# Patient Record
Sex: Male | Born: 1940 | Race: White | Hispanic: No | Marital: Married | Smoking: Former smoker
Health system: Southern US, Community
[De-identification: ages and names within clinical notes are randomized; demographics above are authoritative.]

## PROBLEM LIST (undated history)

## (undated) DIAGNOSIS — E785 Hyperlipidemia, unspecified: Secondary | ICD-10-CM

## (undated) DIAGNOSIS — M1990 Unspecified osteoarthritis, unspecified site: Secondary | ICD-10-CM

## (undated) DIAGNOSIS — N529 Male erectile dysfunction, unspecified: Secondary | ICD-10-CM

## (undated) DIAGNOSIS — K219 Gastro-esophageal reflux disease without esophagitis: Secondary | ICD-10-CM

## (undated) DIAGNOSIS — K279 Peptic ulcer, site unspecified, unspecified as acute or chronic, without hemorrhage or perforation: Secondary | ICD-10-CM

## (undated) DIAGNOSIS — G43909 Migraine, unspecified, not intractable, without status migrainosus: Secondary | ICD-10-CM

## (undated) DIAGNOSIS — E079 Disorder of thyroid, unspecified: Secondary | ICD-10-CM

## (undated) DIAGNOSIS — C801 Malignant (primary) neoplasm, unspecified: Secondary | ICD-10-CM

## (undated) DIAGNOSIS — I10 Essential (primary) hypertension: Secondary | ICD-10-CM

## (undated) HISTORY — DX: Peptic ulcer, site unspecified, unspecified as acute or chronic, without hemorrhage or perforation: K27.9

## (undated) HISTORY — PX: SPINE SURGERY: SHX786

## (undated) HISTORY — DX: Male erectile dysfunction, unspecified: N52.9

## (undated) HISTORY — DX: Hyperlipidemia, unspecified: E78.5

## (undated) HISTORY — DX: Disorder of thyroid, unspecified: E07.9

## (undated) HISTORY — DX: Gastro-esophageal reflux disease without esophagitis: K21.9

## (undated) HISTORY — DX: Unspecified osteoarthritis, unspecified site: M19.90

## (undated) HISTORY — DX: Essential (primary) hypertension: I10

## (undated) HISTORY — PX: HERNIA REPAIR: SHX51

## (undated) HISTORY — PX: MENISCUS REPAIR: SHX5179

## (undated) HISTORY — DX: Migraine, unspecified, not intractable, without status migrainosus: G43.909

## (undated) HISTORY — DX: Malignant (primary) neoplasm, unspecified: C80.1

---
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
## 1998-09-25 ENCOUNTER — Emergency Department (HOSPITAL_COMMUNITY): Admission: EM | Admit: 1998-09-25 | Discharge: 1998-09-25 | Payer: Self-pay | Admitting: Emergency Medicine

## 2002-06-09 ENCOUNTER — Encounter: Payer: Self-pay | Admitting: Family Medicine

## 2002-06-09 ENCOUNTER — Encounter: Admission: RE | Admit: 2002-06-09 | Discharge: 2002-06-09 | Payer: Self-pay | Admitting: Family Medicine

## 2002-06-16 ENCOUNTER — Encounter: Payer: Self-pay | Admitting: Neurosurgery

## 2002-06-16 ENCOUNTER — Encounter: Admission: RE | Admit: 2002-06-16 | Discharge: 2002-06-16 | Payer: Self-pay | Admitting: Neurosurgery

## 2002-06-30 ENCOUNTER — Encounter: Payer: Self-pay | Admitting: Neurosurgery

## 2002-06-30 ENCOUNTER — Encounter: Admission: RE | Admit: 2002-06-30 | Discharge: 2002-06-30 | Payer: Self-pay | Admitting: Neurosurgery

## 2002-07-11 ENCOUNTER — Encounter: Payer: Self-pay | Admitting: Neurosurgery

## 2002-07-11 ENCOUNTER — Encounter: Admission: RE | Admit: 2002-07-11 | Discharge: 2002-07-11 | Payer: Self-pay | Admitting: Neurosurgery

## 2003-10-23 ENCOUNTER — Ambulatory Visit (HOSPITAL_COMMUNITY): Admission: RE | Admit: 2003-10-23 | Discharge: 2003-10-24 | Payer: Self-pay | Admitting: Neurosurgery

## 2005-07-16 ENCOUNTER — Ambulatory Visit: Payer: Self-pay | Admitting: Family Medicine

## 2005-07-21 ENCOUNTER — Ambulatory Visit: Payer: Self-pay

## 2005-07-31 ENCOUNTER — Encounter: Admission: RE | Admit: 2005-07-31 | Discharge: 2005-07-31 | Payer: Self-pay | Admitting: Neurosurgery

## 2005-08-28 ENCOUNTER — Ambulatory Visit: Payer: Self-pay | Admitting: Gastroenterology

## 2005-09-15 ENCOUNTER — Ambulatory Visit: Payer: Self-pay | Admitting: Gastroenterology

## 2005-09-15 ENCOUNTER — Encounter (INDEPENDENT_AMBULATORY_CARE_PROVIDER_SITE_OTHER): Payer: Self-pay | Admitting: *Deleted

## 2005-11-04 ENCOUNTER — Inpatient Hospital Stay (HOSPITAL_COMMUNITY): Admission: RE | Admit: 2005-11-04 | Discharge: 2005-11-10 | Payer: Self-pay | Admitting: Neurosurgery

## 2007-01-12 ENCOUNTER — Ambulatory Visit: Payer: Self-pay | Admitting: Family Medicine

## 2007-01-12 LAB — CONVERTED CEMR LAB
AST: 21 units/L (ref 0–37)
Basophils Relative: 0.6 % (ref 0.0–1.0)
CO2: 32 meq/L (ref 19–32)
Calcium: 9 mg/dL (ref 8.4–10.5)
Chloride: 101 meq/L (ref 96–112)
Eosinophils Relative: 2.8 % (ref 0.0–5.0)
GFR calc non Af Amer: 59 mL/min
Glucose, Bld: 112 mg/dL — ABNORMAL HIGH (ref 70–99)
HDL: 23.1 mg/dL — ABNORMAL LOW (ref >39.0)
PSA: 3.92 ng/mL (ref 0.10–4.00)
Platelets: 226 10*3/uL (ref 150–400)
RBC: 4.66 M/uL (ref 4.22–5.81)
RDW: 12.6 % (ref 11.5–14.6)
TSH: 3.37 microintl units/mL (ref 0.35–5.50)
Testosterone: 230.09 ng/dL — ABNORMAL LOW (ref 350.00–890)
Total CHOL/HDL Ratio: 8.2
Triglycerides: 460 mg/dL (ref 0–149)
VLDL: 92 mg/dL — ABNORMAL HIGH (ref 0–40)
WBC: 6.6 10*3/uL (ref 4.5–10.5)

## 2007-06-13 DIAGNOSIS — G43909 Migraine, unspecified, not intractable, without status migrainosus: Secondary | ICD-10-CM | POA: Insufficient documentation

## 2007-08-29 ENCOUNTER — Ambulatory Visit: Payer: Self-pay | Admitting: Family Medicine

## 2007-08-29 LAB — CONVERTED CEMR LAB
Glucose, Urine, Semiquant: NEGATIVE
Nitrite: NEGATIVE
Protein, U semiquant: NEGATIVE
WBC Urine, dipstick: NEGATIVE
pH: 5

## 2007-08-31 LAB — CONVERTED CEMR LAB
AST: 22 units/L (ref 0–37)
Basophils Absolute: 0.1 10*3/uL (ref 0.0–0.1)
Basophils Relative: 0.6 % (ref 0.0–1.0)
Bilirubin, Direct: 0.1 mg/dL (ref 0.0–0.3)
CO2: 29 meq/L (ref 19–32)
Chloride: 102 meq/L (ref 96–112)
Creatinine, Ser: 1.4 mg/dL (ref 0.4–1.5)
Direct LDL: 101.1 mg/dL
Eosinophils Relative: 2 % (ref 0.0–5.0)
Glucose, Bld: 100 mg/dL — ABNORMAL HIGH (ref 70–99)
HCT: 38.7 % — ABNORMAL LOW (ref 39.0–52.0)
Hemoglobin: 13.6 g/dL (ref 13.0–17.0)
MCHC: 35.2 g/dL (ref 30.0–36.0)
Monocytes Absolute: 0.7 10*3/uL (ref 0.2–0.7)
Neutrophils Relative %: 62.5 % (ref 43.0–77.0)
PSA: 5.33 ng/mL — ABNORMAL HIGH (ref 0.10–4.00)
Potassium: 4 meq/L (ref 3.5–5.1)
RDW: 13.2 % (ref 11.5–14.6)
Sodium: 140 meq/L (ref 135–145)
Total Bilirubin: 0.9 mg/dL (ref 0.3–1.2)
Total CHOL/HDL Ratio: 9.4
Total Protein: 6.5 g/dL (ref 6.0–8.3)
WBC: 9.7 10*3/uL (ref 4.5–10.5)

## 2007-11-23 ENCOUNTER — Ambulatory Visit: Payer: Self-pay | Admitting: Family Medicine

## 2007-11-23 DIAGNOSIS — R972 Elevated prostate specific antigen [PSA]: Secondary | ICD-10-CM | POA: Insufficient documentation

## 2007-11-23 DIAGNOSIS — F528 Other sexual dysfunction not due to a substance or known physiological condition: Secondary | ICD-10-CM | POA: Insufficient documentation

## 2007-11-23 DIAGNOSIS — I10 Essential (primary) hypertension: Secondary | ICD-10-CM | POA: Insufficient documentation

## 2007-12-01 HISTORY — PX: PROSTATE SURGERY: SHX751

## 2007-12-15 ENCOUNTER — Encounter: Payer: Self-pay | Admitting: Family Medicine

## 2007-12-16 ENCOUNTER — Ambulatory Visit: Payer: Self-pay | Admitting: Family Medicine

## 2008-01-03 ENCOUNTER — Encounter: Payer: Self-pay | Admitting: Family Medicine

## 2008-01-16 ENCOUNTER — Inpatient Hospital Stay (HOSPITAL_COMMUNITY): Admission: EM | Admit: 2008-01-16 | Discharge: 2008-01-18 | Payer: Self-pay | Admitting: Emergency Medicine

## 2008-01-16 ENCOUNTER — Ambulatory Visit: Payer: Self-pay | Admitting: Cardiovascular Disease

## 2008-01-16 ENCOUNTER — Ambulatory Visit: Payer: Self-pay | Admitting: Family Medicine

## 2008-01-17 ENCOUNTER — Encounter: Payer: Self-pay | Admitting: Internal Medicine

## 2008-01-17 DIAGNOSIS — R079 Chest pain, unspecified: Secondary | ICD-10-CM | POA: Insufficient documentation

## 2008-01-17 DIAGNOSIS — K270 Acute peptic ulcer, site unspecified, with hemorrhage: Secondary | ICD-10-CM | POA: Insufficient documentation

## 2008-01-17 DIAGNOSIS — K219 Gastro-esophageal reflux disease without esophagitis: Secondary | ICD-10-CM | POA: Insufficient documentation

## 2008-01-23 ENCOUNTER — Ambulatory Visit: Payer: Self-pay | Admitting: Internal Medicine

## 2008-02-01 ENCOUNTER — Encounter: Payer: Self-pay | Admitting: Family Medicine

## 2008-02-03 ENCOUNTER — Encounter: Payer: Self-pay | Admitting: Family Medicine

## 2008-02-06 ENCOUNTER — Encounter: Payer: Self-pay | Admitting: Family Medicine

## 2008-02-13 ENCOUNTER — Encounter: Payer: Self-pay | Admitting: Family Medicine

## 2008-02-13 ENCOUNTER — Ambulatory Visit (HOSPITAL_COMMUNITY): Admission: RE | Admit: 2008-02-13 | Discharge: 2008-02-13 | Payer: Self-pay | Admitting: Urology

## 2008-02-16 ENCOUNTER — Encounter: Payer: Self-pay | Admitting: Family Medicine

## 2008-02-22 ENCOUNTER — Ambulatory Visit: Payer: Self-pay | Admitting: Family Medicine

## 2008-02-23 LAB — CONVERTED CEMR LAB
Glucose, Bld: 113 mg/dL — ABNORMAL HIGH (ref 70–99)
Hgb A1c MFr Bld: 6.7 % — ABNORMAL HIGH (ref 4.6–6.0)

## 2008-02-29 ENCOUNTER — Encounter: Payer: Self-pay | Admitting: Family Medicine

## 2008-03-05 ENCOUNTER — Ambulatory Visit (HOSPITAL_COMMUNITY): Admission: RE | Admit: 2008-03-05 | Discharge: 2008-03-05 | Payer: Self-pay | Admitting: Urology

## 2008-03-19 ENCOUNTER — Encounter (INDEPENDENT_AMBULATORY_CARE_PROVIDER_SITE_OTHER): Payer: Self-pay | Admitting: Urology

## 2008-03-19 ENCOUNTER — Inpatient Hospital Stay (HOSPITAL_COMMUNITY): Admission: RE | Admit: 2008-03-19 | Discharge: 2008-03-20 | Payer: Self-pay | Admitting: Urology

## 2008-04-13 ENCOUNTER — Encounter: Payer: Self-pay | Admitting: Family Medicine

## 2008-05-25 ENCOUNTER — Encounter: Payer: Self-pay | Admitting: Family Medicine

## 2008-06-21 ENCOUNTER — Ambulatory Visit: Payer: Self-pay | Admitting: Family Medicine

## 2008-06-27 LAB — CONVERTED CEMR LAB
Creatinine,U: 48.4 mg/dL
Hgb A1c MFr Bld: 6.8 % — ABNORMAL HIGH (ref 4.6–6.0)
Microalb Creat Ratio: 4.1 mg/g (ref 0.0–30.0)

## 2008-08-24 ENCOUNTER — Encounter: Payer: Self-pay | Admitting: Family Medicine

## 2008-11-12 ENCOUNTER — Encounter: Payer: Self-pay | Admitting: Family Medicine

## 2008-12-11 ENCOUNTER — Encounter: Payer: Self-pay | Admitting: Family Medicine

## 2008-12-18 ENCOUNTER — Inpatient Hospital Stay (HOSPITAL_COMMUNITY): Admission: RE | Admit: 2008-12-18 | Discharge: 2008-12-19 | Payer: Self-pay | Admitting: General Surgery

## 2009-02-05 ENCOUNTER — Telehealth: Payer: Self-pay | Admitting: Family Medicine

## 2009-03-29 ENCOUNTER — Encounter: Payer: Self-pay | Admitting: Family Medicine

## 2009-04-09 ENCOUNTER — Ambulatory Visit: Payer: Self-pay | Admitting: Family Medicine

## 2009-04-10 LAB — CONVERTED CEMR LAB
ALT: 23 units/L (ref 0–53)
Alkaline Phosphatase: 91 units/L (ref 39–117)
Bilirubin, Direct: 0.1 mg/dL (ref 0.0–0.3)
Direct LDL: 74.1 mg/dL
HDL: 23.9 mg/dL — ABNORMAL LOW (ref >39.00)
Total Bilirubin: 0.8 mg/dL (ref 0.3–1.2)
Total Protein: 7.2 g/dL (ref 6.0–8.3)

## 2009-07-13 ENCOUNTER — Ambulatory Visit (HOSPITAL_COMMUNITY): Admission: RE | Admit: 2009-07-13 | Discharge: 2009-07-13 | Payer: Self-pay | Admitting: Sports Medicine

## 2009-10-22 ENCOUNTER — Ambulatory Visit: Payer: Self-pay | Admitting: Family Medicine

## 2009-10-22 LAB — CONVERTED CEMR LAB
BUN: 29 mg/dL — ABNORMAL HIGH (ref 6–23)
CO2: 29 meq/L (ref 19–32)
Calcium: 9 mg/dL (ref 8.4–10.5)
Chloride: 101 meq/L (ref 96–112)
Creatinine, Ser: 1.4 mg/dL (ref 0.4–1.5)
Eosinophils Relative: 0.8 % (ref 0.0–5.0)
Glucose, Bld: 111 mg/dL — ABNORMAL HIGH (ref 70–99)
HCT: 42.7 % (ref 39.0–52.0)
Hemoglobin: 14.4 g/dL (ref 13.0–17.0)
Lymphs Abs: 2.1 10*3/uL (ref 0.7–4.0)
MCV: 91.6 fL (ref 78.0–100.0)
Monocytes Absolute: 0.6 10*3/uL (ref 0.1–1.0)
Monocytes Relative: 6.2 % (ref 3.0–12.0)
Neutro Abs: 6.9 10*3/uL (ref 1.4–7.7)
Platelets: 224 10*3/uL (ref 150.0–400.0)
WBC: 9.7 10*3/uL (ref 4.5–10.5)

## 2009-10-23 LAB — CONVERTED CEMR LAB
LDL Cholesterol: 84 mg/dL (ref 0–99)
Total CHOL/HDL Ratio: 5
VLDL: 24.8 mg/dL (ref 0.0–40.0)

## 2009-11-19 ENCOUNTER — Ambulatory Visit: Payer: Self-pay | Admitting: Family Medicine

## 2009-12-04 ENCOUNTER — Encounter: Payer: Self-pay | Admitting: Family Medicine

## 2010-05-02 ENCOUNTER — Ambulatory Visit: Payer: Self-pay | Admitting: Family Medicine

## 2010-05-07 LAB — CONVERTED CEMR LAB
ALT: 21 units/L (ref 0–53)
AST: 19 units/L (ref 0–37)
Albumin: 3.8 g/dL (ref 3.5–5.2)
Cholesterol: 155 mg/dL (ref 0–200)
HDL: 24.9 mg/dL — ABNORMAL LOW (ref >39.00)
Hgb A1c MFr Bld: 7 % — ABNORMAL HIGH (ref 4.6–6.5)
Total Bilirubin: 0.7 mg/dL (ref 0.3–1.2)
Total Protein: 6.7 g/dL (ref 6.0–8.3)
Triglycerides: 395 mg/dL — ABNORMAL HIGH (ref 0.0–149.0)

## 2010-07-30 ENCOUNTER — Encounter (INDEPENDENT_AMBULATORY_CARE_PROVIDER_SITE_OTHER): Payer: Self-pay | Admitting: *Deleted

## 2010-09-08 ENCOUNTER — Telehealth (INDEPENDENT_AMBULATORY_CARE_PROVIDER_SITE_OTHER): Payer: Self-pay | Admitting: *Deleted

## 2010-10-01 ENCOUNTER — Encounter: Payer: Self-pay | Admitting: Family Medicine

## 2010-10-17 ENCOUNTER — Encounter (INDEPENDENT_AMBULATORY_CARE_PROVIDER_SITE_OTHER): Payer: Self-pay | Admitting: *Deleted

## 2010-10-28 ENCOUNTER — Encounter (INDEPENDENT_AMBULATORY_CARE_PROVIDER_SITE_OTHER): Payer: Self-pay | Admitting: *Deleted

## 2010-10-29 ENCOUNTER — Ambulatory Visit: Payer: Self-pay | Admitting: Gastroenterology

## 2010-11-05 ENCOUNTER — Telehealth (INDEPENDENT_AMBULATORY_CARE_PROVIDER_SITE_OTHER): Payer: Self-pay

## 2010-11-06 ENCOUNTER — Ambulatory Visit: Payer: Self-pay | Admitting: Gastroenterology

## 2010-11-13 ENCOUNTER — Ambulatory Visit: Payer: Self-pay | Admitting: Family Medicine

## 2010-11-13 DIAGNOSIS — Z8546 Personal history of malignant neoplasm of prostate: Secondary | ICD-10-CM | POA: Insufficient documentation

## 2010-11-13 DIAGNOSIS — Z8719 Personal history of other diseases of the digestive system: Secondary | ICD-10-CM | POA: Insufficient documentation

## 2010-11-14 LAB — CONVERTED CEMR LAB
ALT: 23 units/L (ref 0–53)
Albumin: 3.7 g/dL (ref 3.5–5.2)
Alkaline Phosphatase: 77 units/L (ref 39–117)
BUN: 37 mg/dL — ABNORMAL HIGH (ref 6–23)
Basophils Absolute: 0.1 10*3/uL (ref 0.0–0.1)
Bilirubin, Direct: 0.1 mg/dL (ref 0.0–0.3)
Cholesterol: 150 mg/dL (ref 0–200)
Creatinine, Ser: 1.6 mg/dL — ABNORMAL HIGH (ref 0.4–1.5)
Eosinophils Relative: 3.2 % (ref 0.0–5.0)
GFR calc non Af Amer: 45.89 mL/min — ABNORMAL LOW (ref >60.00)
Glucose, Bld: 106 mg/dL — ABNORMAL HIGH (ref 70–99)
HDL: 25 mg/dL — ABNORMAL LOW (ref >39.00)
Hgb A1c MFr Bld: 6.7 % — ABNORMAL HIGH (ref 4.6–6.5)
Monocytes Absolute: 0.6 10*3/uL (ref 0.1–1.0)
Monocytes Relative: 8.1 % (ref 3.0–12.0)
Neutrophils Relative %: 56 % (ref 43.0–77.0)
Platelets: 242 10*3/uL (ref 150.0–400.0)
RDW: 13.6 % (ref 11.5–14.6)
Total Protein: 6.9 g/dL (ref 6.0–8.3)
VLDL: 46.8 mg/dL — ABNORMAL HIGH (ref 0.0–40.0)
WBC: 7.8 10*3/uL (ref 4.5–10.5)

## 2010-11-19 ENCOUNTER — Telehealth (INDEPENDENT_AMBULATORY_CARE_PROVIDER_SITE_OTHER): Payer: Self-pay | Admitting: *Deleted

## 2010-11-21 ENCOUNTER — Ambulatory Visit: Payer: Self-pay | Admitting: Gastroenterology

## 2010-11-26 ENCOUNTER — Encounter: Payer: Self-pay | Admitting: Gastroenterology

## 2010-12-02 DIAGNOSIS — N19 Unspecified kidney failure: Secondary | ICD-10-CM | POA: Insufficient documentation

## 2010-12-03 ENCOUNTER — Ambulatory Visit: Admit: 2010-12-03 | Payer: Self-pay | Admitting: Family Medicine

## 2010-12-10 ENCOUNTER — Other Ambulatory Visit: Payer: Self-pay | Admitting: Family Medicine

## 2010-12-10 ENCOUNTER — Ambulatory Visit
Admission: RE | Admit: 2010-12-10 | Discharge: 2010-12-10 | Payer: Self-pay | Source: Home / Self Care | Attending: Family Medicine | Admitting: Family Medicine

## 2010-12-10 LAB — BASIC METABOLIC PANEL
BUN: 33 mg/dL — ABNORMAL HIGH (ref 6–23)
CO2: 26 mEq/L (ref 19–32)
Calcium: 9 mg/dL (ref 8.4–10.5)
Chloride: 100 mEq/L (ref 96–112)
Creatinine, Ser: 1.5 mg/dL (ref 0.4–1.5)
GFR: 50.6 mL/min — ABNORMAL LOW (ref >60.00)
Glucose, Bld: 99 mg/dL (ref 70–99)
Potassium: 4.4 mEq/L (ref 3.5–5.1)
Sodium: 136 mEq/L (ref 135–145)

## 2010-12-30 NOTE — Letter (Signed)
Summary: [facility] Instructions  [facility]  [address] [place], [place] [address]   Phone: [phone]  Fax: [phone]       Troy Howard    19-May-1941    MRN: [id number]        Procedure Day Dorna Bloom:  [name] 11/06/10     Arrival Time:  9:30AM     Procedure Time:  10:30AM     Location of Procedure:                    _X _  [facility] (4th Floor)                        PREPARATION FOR COLONOSCOPY WITH MOVIPREP   Starting 5 days prior to your procedure  11/01/10  do not eat nuts, seeds, popcorn, corn, beans, peas,  salads, or any raw vegetables.  Do not take any fiber supplements (e.g. Metamucil, Citrucel, and Benefiber).  THE DAY BEFORE YOUR PROCEDURE         DATE:  [facility]  11/05/10  1.  Drink clear liquids the entire day-NO SOLID FOOD  2.  Do not drink anything colored red or purple.  Avoid juices with pulp.  No orange juice.  3.  Drink at least 64 oz. (8 glasses) of fluid/clear liquids during the day to prevent dehydration and help the prep work efficiently.  CLEAR LIQUIDS INCLUDE: Water Jello Ice Popsicles Tea (sugar ok, no milk/cream) Powdered fruit flavored drinks Coffee (sugar ok, no milk/cream) Gatorade Juice: apple, white grape, white cranberry  Lemonade Clear bullion, consomm, broth Carbonated beverages (any kind) Strained chicken noodle soup Hard Candy                             4.  In the morning, mix first dose of MoviPrep solution:    Empty 1 Pouch A and 1 Pouch B into the disposable container    Add lukewarm drinking water to the top line of the container. Mix to dissolve    Refrigerate (mixed solution should be used within 24 hrs)  5.  Begin drinking the prep at 5:00 p.m. The MoviPrep container is divided by 4 marks.   Every 15 minutes drink the solution down to the next mark (approximately 8 oz) until the full liter is complete.   6.  Follow completed prep with 16 oz of clear liquid of your choice  (Nothing red or purple).  Continue to drink clear liquids until bedtime.  7.  Before going to bed, mix second dose of MoviPrep solution:    Empty 1 Pouch A and 1 Pouch B into the disposable container    Add lukewarm drinking water to the top line of the container. Mix to dissolve    Refrigerate  THE DAY OF YOUR PROCEDURE      DATE: 11/06/10   DAY:  THURSDAY  Beginning at  5:30AM (5 hours before procedure):         1. Every 15 minutes, drink the solution down to the next mark (approx 8 oz) until the full liter is complete.  2. Follow completed prep with 16 oz. of clear liquid of your choice.    3. You may drink clear liquids until 8:30AM  (2 HOURS BEFORE PROCEDURE).   MEDICATION INSTRUCTIONS  Unless otherwise instructed, you should take regular prescription medications with a small sip of water   as  early as possible the morning of your procedure.         OTHER INSTRUCTIONS  You will need a responsible adult at least 69 years of age to accompany you and drive you home.   This person must remain in the waiting room during your procedure.  Wear loose fitting clothing that is easily removed.  Leave jewelry and other valuables at home.  However, you may wish to bring a book to read or  an iPod/MP3 player to listen to music as you wait for your procedure to start.  Remove all body piercing jewelry and leave at home.  Total time from sign-in until discharge is approximately 2-3 hours.  You should go home directly after your procedure and rest.  You can resume normal activities the  day after your procedure.  The day of your procedure you should not:   Drive   Make legal decisions   Operate machinery   Drink alcohol   Return to work  You will receive specific instructions about eating, activities and medications before you leave.    The above instructions have been reviewed and explained to me by   Wyona Almas RN  October 29, 2010 4:35 PM     I fully  understand and can verbalize these instructions _____________________________ Date _________

## 2010-12-30 NOTE — Letter (Signed)
Summary: Diabetic Instructions  [facility]  [address] [place], [place] [address]   Phone: [phone]  Fax: [phone]    CYLE KENYON 19-Oct-1941 MRN: [id number]   _ x _   ORAL DIABETIC MEDICATION INSTRUCTIONS  The day before your procedure:   Take your diabetic pill as you do normally  The day of your procedure:   Do not take your diabetic pill    We will check your blood sugar levels during the admission process and again in Recovery before discharging you home  ________________________________________________________________________

## 2010-12-30 NOTE — Progress Notes (Signed)
Summary: Reschedule colon   Phone Note From Other Clinic   Caller: Dr Scotty Court Call For: Lavonna Rua Summary of Call: Patient is scheduled for colon tomorrow with Dr Russella Dar, but has admitted to a hospital in [facility] for enteritis.  Per Dr Russella Dar need to cancel colon for tomorrow and reschedule him in about a month.  I will contact the patient tomorrow.  Initial call taken by: Darcey Nora RN, CGRN,  November 05, 2010 1:53 PM  Follow-up for Phone Call        Left message for patient to call back Darcey Nora RN, [facility]  November 06, 2010 11:03 AM  Patient 's wife called and wants colon prior to the end of the year.  I have explained to the patient's wife that Dr Russella Dar indicated he wanted him to wait 1 month after admission.  Wife indicated he had severe dehydration and acute renal failure.  Wife is advised that Dr Russella Dar has no current openings prior to the end of the year.  Dr Russella Dar if an opening becomes available is it ok to schedule. Follow-up by: Darcey Nora RN, CGRN,  November 06, 2010 3:57 PM  Additional Follow-up for Phone Call Additional follow up Details #1::        Dr. Scotty Court sent me a flag and asked [place] to get the pt in before the end of the year for colonoscopy, for insurance reasons. 12/21 or 12/23 at 4pm are open.   Additional Follow-up by: Meryl Dare MD [facility],  November 06, 2010 5:01 PM    Additional Follow-up for Phone Call Additional follow up Details #2::    Patient is scheduled for a colon 11/21/10 4:00.  I have reviewed his change in colon instructions. Follow-up by: Darcey Nora RN, CGRN,  November 07, 2010 10:24 AM

## 2010-12-30 NOTE — Letter (Signed)
Summary: Pre Visit Letter Revised  [facility]  [address] [place], [place] [address]   Phone: [phone]  Fax: [phone]        10/17/2010 MRN: [id number] [facility] [address] [place], [place]  [address]             Procedure Date:  11/04/2010  Welcome to the Gastroenterology Division at [facility].    You are scheduled to see a nurse for your pre-procedure visit on 10/29/2010 at 4:00 PM on the 3rd floor at [facility], [address]  We ask that you try to arrive at our office 15 minutes prior to your appointment time to allow for check-in.  Please take a minute to review the attached form.  If you answer "Yes" to one or more of the questions on the first page, we ask that you call the person listed at your earliest opportunity.  If you answer "No" to all of the questions, please complete the rest of the form and bring it to your appointment.    Your nurse visit will consist of discussing your medical and surgical history, your immediate family medical history, and your medications.   If you are unable to list all of your medications on the form, please bring the medication bottles to your appointment and we will list them.  We will need to be aware of both prescribed and over the counter drugs.  We will need to know exact dosage information as well.    Please be prepared to read and sign documents such as consent forms, a financial agreement, and acknowledgement forms.  If necessary, and with your consent, a friend or relative is welcome to sit-in on the nurse visit with you.  Please bring your insurance card so that we may make a copy of it.  If your insurance requires a referral to see a specialist, please bring your referral form from your primary care physician.  No co-pay is required for this nurse visit.     If you cannot keep your appointment, please call [phone] to cancel or reschedule prior to your appointment date.   This allows [place] the opportunity to schedule an appointment for another patient in need of care.    Thank you for choosing [facility] for your medical needs.  We appreciate the opportunity to care for you.  Please visit [place] at our website  to learn more about our practice.  Sincerely, The Gastroenterology Division

## 2010-12-30 NOTE — Letter (Signed)
Summary: Colonoscopy Letter  [facility]  [address] [place], [place] [address]   Phone: [phone]  Fax: [phone]      July 30, 2010 MRN: [id number]   [facility] [address] [place], [place]  [address]   Dear Mr. Huizinga,   According to your medical record, it is time for you to schedule a Colonoscopy. The American Cancer Society recommends this procedure as a method to detect early colon cancer. Patients with a family history of colon cancer, or a personal history of colon polyps or inflammatory bowel disease are at increased risk.  This letter has beeen generated based on the recommendations made at the time of your procedure. If you feel that in your particular situation this may no longer apply, please contact our office.  Please call our office at [phone] to schedule this appointment or to update your records at your earliest convenience.  Thank you for cooperating with [place] to provide you with the very best care possible.   Sincerely,  Judie Petit T. Russella Dar, M.D.  [facility] [phone]

## 2010-12-30 NOTE — Progress Notes (Signed)
Summary: flu vaccination  Phone Note From Pharmacy        Immunization History:  Influenza Immunization History:    Influenza:  historical (09/05/2010)

## 2010-12-30 NOTE — Miscellaneous (Signed)
Summary: [facility] Previsit/prep  Clinical Lists Changes  Medications: Added new medication of MOVIPREP 100 GM  SOLR (PEG-KCL-NACL-NASULF-NA ASC-C) As per prep instructions. - Signed Rx of MOVIPREP 100 GM  SOLR (PEG-KCL-NACL-NASULF-NA ASC-C) As per prep instructions.;  #1 x 0;  Signed;  Entered by: Wyona Almas RN;  Authorized by: Meryl Dare MD [facility];  Method used: [id number] [address] [place], [place]  [address], Ph: [phone] or [phone], Fax: [phone] Observations: Added new observation of NKA: T (10/29/2010 15:37)    Prescriptions: MOVIPREP 100 GM  SOLR (PEG-KCL-NACL-NASULF-NA ASC-C) As per prep instructions.  #1 x 0   Entered by:   Wyona Almas RN   Authorized by:   Meryl Dare MD [facility]   Signed by:   Wyona Almas RN on 10/29/2010   Method used:   [facility]ronically to        [facility] [phone]* (retail)       [address]       [place], [place]  [address]       Ph: [phone] or [phone]       Fax: [phone]   RxID:   [id number]

## 2011-01-01 NOTE — Letter (Signed)
Summary: Patient Notice- Polyp Results   [facility]  [address] [place], [place] [address]   Phone: [phone]  Fax: [phone]        November 26, 2010 MRN: [id number]    [facility] [address] [place], [place]  [address]    Dear Mr. Ess,  I am pleased to inform you that the colon polyp(s) removed during your recent colonoscopy was (were) found to be benign (no cancer detected) upon pathologic examination.  I recommend you have a repeat colonoscopy examination in 5 years to look for recurrent polyps, as having colon polyps increases your risk for having recurrent polyps or even colon cancer in the future.  Should you develop new or worsening symptoms of abdominal pain, bowel habit changes or bleeding from the rectum or bowels, please schedule an evaluation with either your primary care physician or with me.  Continue treatment plan as outlined the day of your exam.  Please call us if you are having persistent problems or have questions about your condition that have not been fully answered at this time.  Sincerely,  Meryl Dare MD [facility]  This letter has been electronically signed by your physician.  Appended Document: Patient Notice- Polyp Results Letter mailed  Appended Document: Orders Update    Clinical Lists Changes  Problems: Added new problem of KIDNEY FAILURE (ICD-586) Orders: Added new Test order of TLB-BMP (Basic Metabolic Panel-BMET) (80048-METABOL) - Signed

## 2011-01-01 NOTE — Assessment & Plan Note (Signed)
Summary: Review Labs   Vital Signs:  Patient profile:   70 year old male Pulse rate:   78 / minute Pulse rhythm:   regular Resp:     16 per minute BP sitting:   122 /   (left arm) Cuff size:   large  History of Present Illness: This 70 year old white married male known diabetic, hypertension doing well until he had letter of diarrhea for approximately 5-6 days and was eventually admitted to [facility] and her weight area at that time he was found to have renal failure blood pressure were hypotensive he had an episode of near syncope was very dehydrated and admitted for coffee 5 date of discharge on 1210 11 Since then he's been doing her well and in today to repeat laboratory studies as well as examination renal studies and hemoglobin A1c with C. to be drawn Prior to this episode the patient in November her well his diabetes has been well controlled as well as his hypertension As of today he he has no complaints of his systems negative diarrhea was resolved has history of prostate carcinoma and doing well  Allergies: No Known Drug Allergies  Review of Systems      See HPI  Physical Exam  General:  Well-developed,well-nourished,in no acute distress; alert,appropriate and cooperative throughout examinationoverweight-appearing.   Head:  Normocephalic and atraumatic without obvious abnormalities. No apparent alopecia or balding. Eyes:  No corneal or conjunctival inflammation noted. EOMI. Perrla. Funduscopic exam benign, without hemorrhages, exudates or papilledema. Vision grossly normal. Ears:  External ear exam shows no significant lesions or deformities.  Otoscopic examination reveals clear canals, tympanic membranes are intact bilaterally without bulging, retraction, inflammation or discharge. Hearing is grossly normal bilaterally. Nose:  External nasal examination shows no deformity or inflammation. Nasal mucosa are pink and moist without lesions or exudates. Mouth:   Oral mucosa and oropharynx without lesions or exudates.  Teeth in good repair. Neck:  No deformities, masses, or tenderness noted. Chest Wall:  No deformities, masses, tenderness or gynecomastia noted. Lungs:  Normal respiratory effort, chest expands symmetrically. Lungs are clear to auscultation, no crackles or wheezes. Heart:  Normal rate and regular rhythm. S1 and S2 normal without gallop, murmur, click, rub or other extra sounds. Abdomen:  Bowel sounds positive,abdomen soft and non-tender without masses, organomegaly or hernias noted. Msk:  No deformity or scoliosis noted of thoracic or lumbar spine.   Pulses:  R and L carotid,radial,femoral,dorsalis pedis and posterior tibial pulses are full and equal bilaterally Extremities:  No clubbing, cyanosis, edema, or deformity noted with normal full range of motion of all joints.   Neurologic:  No cranial nerve deficits noted. Station and gait are normal. Plantar reflexes are down-going bilaterally. DTRs are symmetrical throughout. Sensory, motor and coordinative functions appear intact. Skin:  Intact without suspicious lesions or rashes Cervical Nodes:  No lymphadenopathy noted Axillary Nodes:  No palpable lymphadenopathy Inguinal Nodes:  No significant adenopathy Psych:  Cognition and judgment appear intact. Alert and cooperative with normal attention span and concentration. No apparent delusions, illusions, hallucinations   Impression & Recommendations:  Problem # 1:  GERD (ICD-530.81) Assessment Improved  His updated medication list for this problem includes:    Omeprazole 40 Mg Cpdr (Omeprazole) .Marland Kitchen... 1 by mouth once daily  Problem # 2:  KIDNEY FAILURE (ICD-586) Assessment: Deteriorated 2 repeat BMP  Problem # 3:  HYPERTENSION, BENIGN (ICD-401.1) Assessment: Improved  His updated medication list for this problem includes:    Benicar Hct  40-25 Mg Tabs (Olmesartan medoxomil-hctz) .Marland Kitchen... 1 tab once daily  Problem # 4:  PROSTATE  CANCER, HX OF (ICD-V10.46) Assessment: Improved  Complete Medication List: 1)  Benicar Hct 40-25 Mg Tabs (Olmesartan medoxomil-hctz) .Marland Kitchen.. 1 tab once daily 2)  Fioricet 50-325-40 Mg Tabs (Butalbital-apap-caffeine) .... 2 tabs every 4 hrs as needed for headache 3)  Adult Aspirin Ec Low Strength 81 Mg Tbec (Aspirin) .Marland Kitchen.. 1 once daily 4)  Omeprazole 40 Mg Cpdr (Omeprazole) .Marland Kitchen.. 1 by mouth once daily 5)  Metformin Hcl 500 Mg Tabs (Metformin hcl) .Marland Kitchen.. 1 am and pm for diabetes  Patient Instructions: 1)  we'll call results of lab studies as well examination reveals your doing well 2)  I feel he reinflated will correct with time   Orders Added: 1)  Est. Patient Level IV [[address]]

## 2011-01-01 NOTE — Progress Notes (Signed)
Summary: Colon this fri 12-23  Phone Note Call from Patient Call back at [phone] OR [phone]   Call For: DR [facility] Summary of Call: Colon was changed and is not sure when to start drinking his Moviprep Initial call taken by: Leanor Kail [facility],  November 19, 2010 2:45 PM  Follow-up for Phone Call        Reviewed changes in prep instructions with patient. Follow-up by: Ezra Sites RN,  November 19, 2010 3:03 PM     Appended Document: Colon this fri 12-23 reviewed

## 2011-01-01 NOTE — Procedures (Signed)
Summary: Colonoscopy  Patient: Troy Howard Note: All result statuses are Final unless otherwise noted.  Tests: (1) Colonoscopy (COL)   COL Colonoscopy           DONE     [facility]     [address]     [place], [place]  [address]           COLONOSCOPY PROCEDURE REPORT     PATIENT:  Troy, Howard  MR#:  [id number]     BIRTHDATE:  1941/03/29, 69 yrs. old  GENDER:  male     ENDOSCOPIST:  Judie Petit T. Russella Dar, MD, [facility]           PROCEDURE DATE:  11/21/2010     PROCEDURE:  Colonoscopy with biopsy and snare polypectomy     ASA CLASS:  Class II     INDICATIONS:  1) Elevated Risk Screening  2) family history of     colon cancer: father in his 31s.     MEDICATIONS:   Fentanyl 62.5 mcg IV, Versed 7.5 mg IV     DESCRIPTION OF PROCEDURE:   After the risks benefits and     alternatives of the procedure were thoroughly explained, informed     consent was obtained.  Digital rectal exam was performed and     revealed no abnormalities.   The LB [id number] endoscope     was introduced through the anus and advanced to the cecum, which     was identified by both the appendix and ileocecal valve, without     limitations.  The quality of the prep was good, using MoviPrep.     The instrument was then slowly withdrawn as the colon was fully     examined.     <<PROCEDUREIMAGES>>     FINDINGS:  A sessile polyp was found in the cecum. It was 3 mm in     size. The polyp was removed using cold biopsy forceps.  A sessile     polyp was found in the mid transverse colon. It was 5 mm in size.     Polyp was snared without cautery. Retrieval was successful.     Moderate diverticulosis was found in the sigmoid colon.  This was     otherwise a normal examination of the colon. Retroflexed views in     the rectum revealed no abnormalities. The time to cecum =  2     minutes. The scope was then withdrawn (time =  9.25  min) from the     patient and the procedure completed.        COMPLICATIONS:  None           ENDOSCOPIC IMPRESSION:     1) 3 mm sessile polyp in the cecum     2) 5 mm sessile polyp in the mid transverse colon     3) Moderate diverticulosis in the sigmoid colon           RECOMMENDATIONS:     1) Await pathology results     2) High fiber diet with liberal fluid intake.     3) Repeat Colonoscopy in 5 years pending pathology review.           Venita Lick. Russella Dar, MD, [facility]           CC: Dianna Limbo, MD           n.     Rosalie DoctorVenita Lick. Stark at 11/21/2010 02:17 PM  Abbott, Jasinski, [id number]  Note: An exclamation mark (!) indicates a result that was not dispersed into the flowsheet. Document Creation Date: 11/21/2010 2:18 PM _______________________________________________________________________  (1) Order result status: Final Collection or observation date-time: 11/21/2010 14:13 Requested date-time:  Receipt date-time:  Reported date-time:  Referring Physician:   Ordering Physician: Claudette Head [id number]) Specimen Source:  Source: Launa Grill Order Number: [id number] Lab site:   Appended Document: Colonoscopy     Procedures Next Due Date:    Colonoscopy: 10/2015  Appended Document: Colonoscopy reviewed

## 2011-01-02 NOTE — Letter (Signed)
Summary: [facility]   Imported By: Sherian Rein 10/14/2010 11:19:59  _____________________________________________________________________  External Attachment:    Type:   Image     Comment:   External Document

## 2011-01-02 NOTE — Letter (Signed)
Summary: [facility]  [facility]   Imported By: Maryln Gottron 01/08/2010 13:00:19  _____________________________________________________________________  External Attachment:    Type:   Image     Comment:   External Document

## 2011-02-09 LAB — GLUCOSE, CAPILLARY
Glucose-Capillary: 108 mg/dL — ABNORMAL HIGH (ref 70–99)
Glucose-Capillary: 152 mg/dL — ABNORMAL HIGH (ref 70–99)
Glucose-Capillary: 71 mg/dL (ref 70–99)
Glucose-Capillary: 83 mg/dL (ref 70–99)

## 2011-02-13 ENCOUNTER — Other Ambulatory Visit: Payer: Self-pay | Admitting: Family Medicine

## 2011-02-23 ENCOUNTER — Other Ambulatory Visit: Payer: Self-pay | Admitting: Family Medicine

## 2011-03-16 LAB — DIFFERENTIAL
Eosinophils Absolute: 0.1 10*3/uL (ref 0.0–0.7)
Lymphocytes Relative: 23 % (ref 12–46)
Lymphs Abs: 2.2 10*3/uL (ref 0.7–4.0)
Monocytes Relative: 6 % (ref 3–12)
Neutrophils Relative %: 70 % (ref 43–77)

## 2011-03-16 LAB — COMPREHENSIVE METABOLIC PANEL
ALT: 33 U/L (ref 0–53)
AST: 23 U/L (ref 0–37)
Calcium: 8.6 mg/dL (ref 8.4–10.5)
Creatinine, Ser: 1.24 mg/dL (ref 0.4–1.5)
GFR calc Af Amer: >60 mL/min (ref >60)
Sodium: 135 mEq/L (ref 135–145)
Total Protein: 7 g/dL (ref 6.0–8.3)

## 2011-03-16 LAB — CBC
MCHC: 34.5 g/dL (ref 30.0–36.0)
MCV: 87.7 fL (ref 78.0–100.0)
RDW: 13.9 % (ref 11.5–15.5)

## 2011-04-14 NOTE — H&P (Signed)
Troy Howard, Troy Howard NO.:  [id number]   MEDICAL RECORD NO.:  [id number]          PATIENT TYPE:  EMS   LOCATION:  MAJO                         FACILITY:  [facility]   PHYSICIAN:  Celene Kras, MD       DATE OF BIRTH:  [birth year]   DATE OF ADMISSION:  01/16/2008  DATE OF DISCHARGE:                              HISTORY & PHYSICAL   PRIMARY CARE Gerado Nabers:  Tawny Asal, M.D.   PRIMARY CARDIOLOGIST:  [facility], being seen by Dr. Tonny Bollman.   PATIENT PROFILE:  A 70 year old married Caucasian male without prior  history of CAD who presents with greater than 18-hour history of chest  pain with shortness of breath.   PROBLEMS:  1. Unstable angina.      a.     July 21, 2005 exercise Myoview, exercise time 8 minutes 30       seconds.  Heart rate 150 beats per minute.  METS 10.1.  No acute       ST/T changes.  EF 53%.  Probable diaphragmatic attenuation.  No       ischemia.  2. Hypertension.  3. Borderline diabetes mellitus.  4. History of migraine headaches.  5. History of herniated L4-L5 disk status post L4-L5 laminectomy and      diskectomy November 2004.  6. History of lumbar spondylosis, status post L4-L5, L5-S1      laminectomy, diskectomy and fusion November 05, 2007.  7. BPH, pending biopsy January 18, 2008.   HISTORY OF PRESENT ILLNESS:  A 70 year old married Caucasian male  without prior history of CAD.  He was in his usual state of health until  last night at approximately 9:00 p.m., when he developed 5 to 6 out of  10 substernal chest pain, burning and indigestion associated with  shortness breath, diaphoresis, nausea, frequent belching, and a small  amount of regurgitation.  He took 2 Tums without relief.  He had  continued symptoms and restlessness throughout the night and this  morning noted intermittent left wrist, forearm and biceps pain and  shooting discomfort in addition to his chest pain.  He saw his primary  care Jcion Buddenhagen  this morning, ECG showing subtle ST depression in III, V3  through V5, and he was sent to the ED.  He remains fairly uncomfortable,  complaining of chills and has a low-grade fever.  Lab work is currently  pending.   ALLERGIES:  NO KNOWN DRUG ALLERGIES.   HOME MEDICATIONS:  1. Benicar 40 mg daily.  2. Aspirin 81 mg daily (he has been off pending prostate biopsy).   FAMILY HISTORY:  Mother died at age 1 with a history of hypertension  and Alzheimer's.  Father died of colon CA at 11.  He has a brother and  sister, both of whom have hypertension and diabetes.   SOCIAL HISTORY:  He lives in [place] with his wife.  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  He occasionally will have a beer.  He  denies  any drug use.  He does not routinely exercising but does not  generally experience limitation in activities.   REVIEW OF SYSTEMS:  Positive for chest pain, shortness of breath,  dyspnea on exertion, headache, chills, diaphoresis and nausea with  frequent belching.  History of borderline diabetes.  Otherwise all  systems reviewed and negative.   PHYSICAL EXAMINATION:  VITAL SIGNS:  Temperature 97.9, heart rate 99,  respirations 20, blood pressure 126/61, pulse ox 96% on 2 liters.  GENERAL:  A pleasant white male in no acute distress.  Awake, alert and  oriented x3.  HEENT:  Normal.  NEUROLOGIC:  Grossly intact, nonfocal.  SKIN:  Warm and dry without lesion or masses.  LUNGS:  Respirations are regular and unlabored.  Clear to auscultation.  CARDIAC:  Regular distant S1, S2.  No S3, S4 or murmurs.  ABDOMEN:  Round, soft with mild epigastric tenderness.  Bowel sounds  present x4.  EXTREMITIES:  Warm, dry, pink.  No clubbing, cyanosis or edema.  Dorsalis pedis, posterior tibial pulses 2+ and equal bilaterally.   Chest x-ray is pending.  EKG shows sinus rhythm at a rate of 99 beats  per minute, normal axis, less  than 1 mm ST depression in leads III, V3,  V4 and V5.  Lab work is pending.   ASSESSMENT/PLAN:  1. Unstable angina.  Labs are pending.  The patient is fairly      uncomfortable.  Check cardiac markers and plan cardiac      catheterization this afternoon.  Add heparin, nitrate, aspirin,      statin and beta blocker.  The patient also has chills and mild      abdominal discomfort with low-grade fever.  Also concerned about      possible abdominal pathology such as acute pancreatitis.  We will      check LFTs, amylase and lipase.  Check D-dimer.  Rule out PE.  2. Hypertension, stable.  He uses ARB at home and we will continue      this.  3. Lipids.  We will check lipids and LFTs and add statin, especially      in the setting of borderline diabetes.  4. Borderline diabetes mellitus.  Check CBGs, add sliding-scale      insulin.      Nicolasa Ducking, ANP    ______________________________  Celene Kras, MD    CB/MEDQ  D:  01/16/2008  T:  01/17/2008  Job:  [id number]

## 2011-04-14 NOTE — Op Note (Signed)
Troy Howard, Troy Howard            ACCOUNT NO.:  [id number]   MEDICAL RECORD NO.:  [id number]          PATIENT TYPE:  INP   LOCATION:  1526                         FACILITY:  [facility]   PHYSICIAN:  Heloise Purpura, MD      DATE OF BIRTH:  12-28-1940   DATE OF PROCEDURE:  03/19/2008  DATE OF DISCHARGE:                               OPERATIVE REPORT   PREOPERATIVE DIAGNOSES:  1. 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  2. Umbilical hernia.   POSTOPERATIVE DIAGNOSES:  1. 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  2. Umbilical hernia.   PROCEDURE:  1. Robotic-assisted laparoscopic radical prostatectomy (right nerve      sparing).  2. Bilateral laparoscopic pelvic lymphadenectomy.  3. Umbilical hernia repair   SURGEON:  Dr. Heloise Purpura.   ASSISTANT:  Dr. Delman Kitten.   ANESTHESIA:  General.   COMPLICATIONS:  None.   ESTIMATED BLOOD LOSS:  150 mL.   SPECIMENS:  1. Prostate and seminal vesicles.  2. Right pelvic lymph nodes.  3. Left pelvic lymph nodes.   DRAINS:  1. A 20-French straight catheter.  2. A #19 Blake pelvic drain.   DISPOSITION OF SPECIMENS:  To pathology.   INDICATION:  Troy Howard is a 70 year old gentleman with clinically  localized adenocarcinoma of the prostate.  He did undergo a bone scan  which demonstrated some questionable uptake along his spine.  He  therefore underwent plain films which actually demonstrated the area of  concern to represent the left eleventh rib.  While metastatic disease  could not be excluded, it was felt that this was potentially related to  trauma in the past.  These findings were discussed with the patient, and  it was felt that therapy of curative intent should not be denied to him  based on these findings.  We also discussed the incidental finding of an  he umbilical hernia.  After discussing all of these findings, he did  elect to  proceed with surgical therapy for his prostate cancer and the  above procedure as well as repair of umbilical hernia.  Potential  risks/complications, alternative options, etc.,  were discussed with the  patient in detail, and informed consent was obtained.   DESCRIPTION OF PROCEDURE:  The patient was taken to the operating room,  and a general anesthetic was administered.  He was given preoperative  antibiotics, placed in the dorsal lithotomy position, and prepped and  draped in the usual sterile fashion.  Next, a preoperative time-out was  performed.  A Foley catheter was then inserted into the bladder.  A site  was selected just superior to the umbilicus in the midline for placement  of the camera port.  This was placed using a standard open Hassan  technique which allowed entry into the peritoneal cavity under direct  vision without difficulty.  A 12-mm port was then placed, and a  pneumoperitoneum was established.  A 0 degrees lens was used to inspect  the abdomen, and there was no evidence of  any intra-abdominal injuries  or other abnormalities.  The patient's umbilical hernia defect could be  palpated digitally upon entering the abdomen.  The remaining ports were  then placed.  Bilateral 8-mm robotic ports were placed 10 cm lateral to  and just inferior to the camera port site.  An additional 8-mm robotic  port was placed in the far left lateral abdominal wall.  A 5-mm port was  placed between the camera port and the right robotic port, and an  additional 12-mm port was placed in the far right lateral abdominal  wall.  All ports were placed under direct vision and without difficulty.  The surgical cart was then docked.  With the aid of the cautery  scissors, the bladder was reflected posteriorly, allowing entry into the  space of Retzius and identification of the endopelvic fascia and  prostate.  The endopelvic fascia was then incised from the apex of the  prostate back to the base  bilaterally, and the underlying levator muscle  fibers were swept laterally off of the prostate.  This isolated the  dorsal venous complex which was then stapled and divided with the 45-mm  flex ETS stapler.  The bladder neck was identified with the aid of Foley  catheter manipulation and was divided anteriorly, allowing entry into  the bladder and identification of the Foley catheter.  The catheter  balloon was deflated, and the catheter was brought into the operative  field and used to retract the prostate anteriorly.  This exposed the  posterior bladder neck which was then divided, and dissection proceeded  between the bladder and prostate until the vasa deferentia and seminal  vesicles were identified.  The vasa deferentia were isolated, divided,  and lifted anteriorly.  The seminal vesicles were dissected down to  their tips with care to control the seminal vesicle arterial blood  supply.  Seminal vesicles were then lifted anteriorly, and the space  between Denonvilliers fascia and the anterior rectum was bluntly  developed, thereby isolating the vascular pedicles of the prostate.  On  the right side, the lateral prostatic fascia was sharply incised  allowing the neurovascular bundle to be released.  The vascular pedicle  on this side was ligated with Hem-o-lok clips and divided with sharp  cold scissor dissection.  The neurovascular bundle was then swept off  the apex of prostate and urethra on this side.  On the left side, a wide  nonnerve sparing approach was utilized.  Hemoclips were used to ligate  the vascular pedicle of the prostate, and sharp cold scissor dissection  was used to transect it.  The urethra was then sharply divided, and the  prostate specimen was disarticulated.  The pelvis was then copiously  irrigated, and with irrigation in the pelvis, air was injected into the  rectal catheter, and there was no evidence for a rectal injury.  Hemostasis was ensured.  Attention  then turned to the right pelvic  sidewall.  The fibrofatty tissue between the external iliac vein,  confluence of the iliac vessels, hypogastric artery, and Cooper's  ligament was dissected free from the pelvic sidewall with care to  preserve the obturator nerve.  Hem-o-lok clips were used for  lymphostasis and hemostasis.  An identical procedure was then performed  on the contralateral side.  Both specimens were passed off for permanent  pathologic analysis.  Attention then returned to the pelvis for the  urethral anastomosis.  A 2-0 Vicryl slip-knot was placed at the 6  o'clock  position between the bladder neck and urethra.  A double-armed 3-  0 Monocryl suture was then used to perform a 360 degrees running tension-  free anastomosis between the bladder neck and urethra.  A new 20-French  coude catheter was then inserted into the bladder and irrigated.  There  were no blood clots within the bladder, and the anastomosis appeared to  be watertight.  A #19 Blake drain was then brought through the left  robotic port and appropriately positioned in the pelvis.  It was secured  to skin with a nylon suture.  The right lateral 12-mm port site was then  closed with a 0 Vicryl suture placed with the aid of the suture passer  device.  All remaining ports were removed under direct vision.  The  prostate specimen was then removed intact within the Endopouch retrieval  bag via the periumbilical port site.  This fascial opening was then  extended inferiorly and circumferentially into and around the left side  of the umbilicus.  The umbilical hernia defect was palpated and was  opened along the length of the original camera port site.  Figure-of-  eight 0 Prolene sutures were then placed to close the fascia at the  hernia defect site as well as to close the port site.  All port sites  and incision sites were injected with quarter percent Marcaine and  reapproximated at the skin level with staples.   Sterile dressings were  applied.  The patient appeared to tolerate the procedure well without  complications.  He was able to be extubated and transferred to the  recovery unit in satisfactory condition.      Heloise Purpura, MD  Electronically Signed     LB/MEDQ  D:  03/19/2008  T:  03/19/2008  Job:  [id number]

## 2011-04-14 NOTE — Discharge Summary (Signed)
NAMEKAISEN, ACKERS            ACCOUNT NO.:  [id number]   MEDICAL RECORD NO.:  [id number]          PATIENT TYPE:  INP   LOCATION:  6529                         FACILITY:  [facility]   PHYSICIAN:  Veverly Fells. Excell Seltzer, MD  DATE OF BIRTH:  08-24-1941   DATE OF ADMISSION:  01/16/2008  DATE OF DISCHARGE:  01/18/2008                               DISCHARGE SUMMARY   PRIMARY CARDIOLOGIST:  Dr. Excell Seltzer.   PRIMARY CARE Cicilia Clinger:  Dr. Dianna Limbo .   GASTROENTEROLOGIST:  Dr. Lina Sar.   DISCHARGE DIAGNOSIS:  Right lower lobe pneumonia.   SECONDARY DIAGNOSES:  1. Benign distal esophageal ulcer.  2. Erosive gastritis with Sheria Lang erosions.  3. Nonobstructive coronary artery disease.  4. Normal left ventricular function.  5. Hypertension.  6. History migraine headaches.  7. History of herniated L4-L5 status post surgery.  8. Benign prostatic hypertrophy pending biopsy.  9. Borderline diabetes mellitus.   ALLERGIES:  NO KNOWN DRUG ALLERGIES.   PROCEDURES:  1. Left heart cardiac catheterization.  2. EGD.   HISTORY OF PRESENT ILLNESS:  A 70 year old Caucasian male with the above  problem list.  He was in his usual state of health the approximately  9:00 p.m. on the evening of January 15, 2008, when he began to  experience 5-6/10 substernal chest pain with burning and indigestion,  associated with shortness of breath, diaphoresis, nausea and frequent  belching with a small amount of regurgitation.  Symptoms persisted  throughout the night and became associated with restlessness, prompting  him to present to his primary care Hason Ofarrell on the morning of February  16.  There, ECG showed subtle ST depression in leads 3, V3-V5 and he as  sent to the ED.  Remained uncomfortable in the ED and was admitted for  further evaluation.Marland Kitchen   HOSPITAL COURSE:  The patient underwent left heart cardiac  catheterization on January 16, 2008, revealing nonobstructive coronary  artery disease with a  60% stenosis at the distal LAD.  His EF was 65%  without regional wall motion abnormalities.  He was transferred to step-  down where he remained uncomfortable and was febrile, and had persistent  nausea with one episode of vomiting.  GI was consulted and on February  17 the patient underwent EGD which showed benign distal esophageal ulcer  with erosive gastritis.  It was felt that this may explain the patient's  chest discomfort, but not his fever.  As there was some concern over  possible aspiration during his episode of vomiting, he was placed on  Avelox therapy.  Chest x-ray was performed on February 17, and this  showed patchy airspace disease with probable right lower lobe pneumonia.  Following initiation of Avelox, the patient did not have any additional  temperature, and symptoms have improved dramatically with addition of  PPI as well.  Notably, he has had blood cultures drawn and had one of  two blood cultures positive for gram-positive cocci in clusters.  The  second blood culture shows no growth at this point, and we will continue  to follow this up.  Suspicion at this point is for skin  contaminant.   On admission, Mr. Kerschner was also noted to have an elevated creatinine  of 1.79.  He had no prior documented history or knowledge of renal  insufficiency.  Despite usage of contrast during his catheterization,  his creatinine has remained stable and actually improved to 1.61 on  February 17.  The patient will require outpatient follow-up of his renal  insufficiency.   Mr. Wiltrout has been ambulating without recurrent discomfort or  limitations, being discharged home today in good condition.   DISCHARGE LABORATORIES:  Hemoglobin 12.7, hematocrit 36.7, WBC 7.8,  platelets 177, MCV 86.6.  Sodium 129, potassium 3.7, chloride 97, CO2  25, BUN 31, creatinine 1.61, glucose 103, INR 1.0, total bilirubin 0.9,  alkaline phosphatase 66, AST 16, ALT 21, albumin 3.1, CK 60, MB 1.2,   troponin 0.01, total cholesterol 150, triglycerides 239, HDL 19, LDL 83,  calcium 7.8, phosphorus 3.2, TSH 2.501.  EBV casted antigen pending.  CMV EIA pending D-dimer 0.26.  Gastric pathology is pending.   DISPOSITION:  Patient is being discharged home today in good condition.   FOLLOW-UP APPOINTMENTS:  Has follow-up appointment with Dr. Scotty Court in  approximately 1-2 weeks.   DISCHARGE MEDICATIONS:  1. Omeprazole 40 mg daily.  2. Avelox 4 mg daily times 7 days.  3. Benicar 4 mg daily.  4. Aspirin 81 mg daily, on hold pending prostate biopsy.  5. Lipitor 10 mg daily.   OUTSTANDING LABORATORY STUDIES:  Gastric pathology is pending along with  lab results as above.   DURATION DISCHARGE ENCOUNTER:  Sixty minutes including physician time.      Nicolasa Ducking, ANP      Veverly Fells. Excell Seltzer, MD  Electronically Signed   CB/MEDQ  D:  01/18/2008  T:  01/19/2008  Job:  [id number]   cc:   Ellin Saba., MD  Hedwig Morton. Juanda Chance, MD

## 2011-04-14 NOTE — Cardiovascular Report (Signed)
[name], [name]            ACCOUNT NO.:  [id number]   MEDICAL RECORD NO.:  [id number]          PATIENT TYPE:  INP   LOCATION:  1829                         FACILITY:  [facility]   PHYSICIAN:  Bevelyn Buckles. Bensimhon, MDDATE OF BIRTH:  15-Feb-1941   DATE OF PROCEDURE:  01/16/2008  DATE OF DISCHARGE:                            CARDIAC CATHETERIZATION   PRIMARY CARE PHYSICIAN:  Tawny Asal,   PATIENT IDENTIFICATION:  Troy Howard is a very pleasant 70 year old  male with a history of hypertension and migraine headaches and  borderline diabetes.  He does not have any known coronary disease, he  had negative stress test in 2007.  He was admitted with progressive  chest and epigastric pain associated with shortness of breath and  diaphoresis.  EKG in the ER showed subtle ST depression.  Cardiac  markers and D-dimer were negative.  He was brought to the cardiac  catheterization for angiography.   PROCEDURES PERFORMED:  1. Selective coronary angiography.  2. Left heart cath.  3. Left ventriculogram.  4. Aortic root angiography.  5. StarClose femoral artery closure.   DESCRIPTION OF PROCEDURE:  The risks and indications of the cath were  explained.  Consent was signed and placed on the chart.  A 5 French  arterial sheath was placed in the right femoral artery using a modified  Seldinger technique.  Standard catheters including JL-4, JR-4 and angled  pigtail used for catheterization.  All catheter exchanges made over a  wire.  There were no apparent complications.  Central aortic pressure  was 94/57, mean of 72.  LV pressure was 112/1 with an EDP of 7.   Left main was normal.   LAD is a long vessel wrapping the apex.  It gave off a small first  diagonal and a large second diagonal.  There was diffuse 50% stenosis  throughout proximal and mid section.  In the mid section, there was a  focal 60% lesion.  No flow-limiting disease.   Left circumflex gave off a small OM-1, a large  branching OM-2 and a  small OM-3, it was angiographically normal.   Right coronary was a dominant vessel that gave off acute marginal  branch, a small to moderate PDA and posterolateral.  Angiographically  normal.   Left ventriculogram done in the RAO position showed an EF of 65%.  There  was no wall motion abnormalities.  No mitral regurgitation.   Aortic root angiography showed normal aortic root with no evidence of  dissection.   ASSESSMENT:  1. Moderate nonobstructive coronary artery disease.  2. Normal left ventricular function.  3. Normal aortic root.   PLAN/DISCUSSION:  I am uncertain of what is causing his symptoms.  He is  currently pain-free.  I have discussed it with Dr. Excell Seltzer.  We will get  an abdominal ultrasound to evaluate for any intra-abdominal process and  also to make sure he does not have a descending aortic dissection.  We  did not do an abdominal aortogram as his creatinine was 1.8 and we are  trying to limit contrast exposure and he was pain-free.  Bevelyn Buckles. Bensimhon, MD  Electronically Signed     DRB/MEDQ  D:  01/16/2008  T:  01/17/2008  Job:  [id number]   cc:   Ellin Saba., MD

## 2011-04-14 NOTE — Discharge Summary (Signed)
NAMEESTIVEN, KOHAN            ACCOUNT NO.:  [id number]   MEDICAL RECORD NO.:  [id number]          PATIENT TYPE:  INP   LOCATION:  1526                         FACILITY:  [facility]   PHYSICIAN:  Heloise Purpura, MD      DATE OF BIRTH:  Jan 01, 1941   DATE OF ADMISSION:  03/19/2008  DATE OF DISCHARGE:  03/20/2008                               DISCHARGE SUMMARY   PREOPERATIVE DIAGNOSIS:  Clinically localized adenocarcinoma of the  prostate.   DISCHARGE DIAGNOSIS:  Clinically localized adenocarcinoma of the  prostate.   HISTORY AND PHYSICAL:  For full details, please see admission history  and physical.  Briefly, Mr. Couey is a 70 year old gentleman with  clinically localized adenocarcinoma of the prostate.  After a discussion  regarding management options for treatment, he elected to proceed with  surgical therapy and a robotic-assisted laparoscopic radical  prostatectomy and bilateral pelvic lymphadenectomy.   HOSPITAL COURSE:  On March 19, 2008, he was taken to the operating room  and underwent a robotic-assisted laparoscopic radical block  prostatectomy and bilateral pelvic lymphadenectomy.  He also underwent  an umbilical hernia repair.  He tolerated these procedures well without  complications.  Postoperatively, he was able to be transferred to a  regular hospital room following recovery from anesthesia.  He was able  to begin ambulating the night of surgery which he tolerated without  difficulty.  He was monitored and remained hemodynamically stable.  His  hematocrit on postoperative day #1 was checked and found to be 35.5,  indicating no evidence for postoperative bleeding.  He maintained  excellent urine output with minimal output from his pelvic drain, and  his pelvic drain was able to be removed.  He was begun on a clear liquid  diet which he tolerated without difficulty.  By the afternoon of  postoperative day #1, he had met all discharge criteria and was able to  be  discharged home in excellent condition.   DISPOSITION:  Home.   DISCHARGE MEDICATIONS:  He was instructed to resume his regular home  medications except any aspirin, nonsteroidal anti-inflammatory drugs, or  herbal supplements.  He was given a prescription to take Vicodin as  needed for pain and told to use Colace as a stool softener.  He was also  given a prescription to begin Cipro 1 day prior to his return visit for  Foley catheter removal.  In addition, he was given a prescription to  take Celebrex 200 mg daily, both for cervical neck pain felt to be  related to arthritic changes as well as for postoperative pain control.   DISCHARGE INSTRUCTIONS:  He was instructed to be ambulatory but  specifically told to refrain from any heavy lifting, strenuous activity,  or driving.  He was instructed on routine Foley catheter care and was  told to gradually advance his diet once passing flatus.   FOLLOW UP:  He will follow-up in one week for removal of his Foley  catheter.     Heloise Purpura, MD  Electronically Signed    LB/MEDQ  D:  03/20/2008  T:  03/21/2008  Job:  [id number]

## 2011-04-14 NOTE — Op Note (Signed)
Troy Howard, Troy Howard            ACCOUNT NO.:  [id number]   MEDICAL RECORD NO.:  [id number]          PATIENT TYPE:  INP   LOCATION:  0003                         FACILITY:  [facility]   PHYSICIAN:  Troy Howard, MDDATE OF BIRTH:  [birth year]   DATE OF PROCEDURE:  12/17/2008  DATE OF DISCHARGE:                               OPERATIVE REPORT   PREOPERATIVE DIAGNOSIS:  Incisional hernia.   POSTOPERATIVE DIAGNOSIS:  Incisional hernia.   PROCEDURE:  1. Laparoscopic lysis of adhesions times 30 minutes.  2. Laparoscopic ventral hernia repair with 20 x 25 cm Proceed mesh.   SURGEON:  Troy Howard. Troy Sarna, MD.   ASSISTANT:  None.   FINDINGS:  A 7 x 7 cm ventral hernia with significant omental and small  bowel adhesions.   SPECIMENS:  None.   DRAINS:  None.   COMPLICATIONS:  None.   ESTIMATED BLOOD LOSS:  50 mL.   ANESTHESIA:  General endotracheal.   INDICATIONS:  Troy Howard is a 70 year old male who is otherwise  healthy who underwent a robotic cystoprostatectomy by Troy Mc, MD in  April who also had a small umbilical hernia that was fixed primarily at  the same time.  Since then he has developed a bulge at his umbilicus.  He denies any pain or any obstructive symptoms, but it is slowly  increasing in size.  On exam he has a well-healed incision near his  umbilicus that was  a 3 cm reducible nontender umbilical hernia when I  saw him in November.  I scheduled him for January per his request.  This  hernia certainly had gotten bigger in the interim.  I had him counseled  for a laparoscopic ventral hernia repair with mesh.   PROCEDURE:  After informed consent was obtained the patient was taken to  the operating room.  He was administered 1 gram of intravenous Ancef.  Sequential compression devices were placed on the lower extremities  prior to induction.  His arms were then tucked and appropriately padded.  He then underwent general endotracheal anesthesia without  complication.  His abdomen was then prepped and draped in standard sterile surgical  fashion.  Troy Howard was placed over the abdomen.  Surgical time-out was then  performed.  A Foley catheter was not placed due to his prior  prostatectomy.   A 5 mm incision was made in his left upper quadrant and the OptiView  trocar was used to gain access to the abdomen which was done without  complication.  His abdomen was then insufflated to 15 mmHg pressure.  Following this, he was noted to have a large amount of omentum  incarcerated in his umbilical hernia.  I placed another  port under  direct vision after infiltration with local anesthetic without  complication in his left lower quadrant.  Following this, I then bluntly  removed some adhesions to the left side of his abdominal wall and then  placed a third port, an 11 mm port, in the mid abdomen on the left side  under direct vision after infiltration with local anesthetic without  complication.  Following this, I then began  lysing adhesions.  I did  about 30 minutes of lysis of adhesions with both blunt and sharp  dissection to remove omentum and a loop of small bowel from his hernia.  When this was completely reduced I did insert one more 5 mm trocar in  the right upper quadrant without complication.  Following this, I then  measured his hernia.  His hernia was about 7 x 7 cm when he was not  insufflated.  I did elect to use a 20 x 25 cm piece of Proceed mesh.  I  did cut this to approximately, giving me about 5 cm overlap in each  direction.  I then put four cardinal sutures into the mesh and then  inserted the mesh into the abdomen.  I secured the four cardinal sutures  using an Endo Close device in the appropriate position.  The mesh  appeared to lie well after that.  I then used the ProTack to tack the  edge throughout.  This was followed by another stitch in between all the  remaining stitches using the Storz device.  The stitches were all of  2-0  Prolene giving a total of 8 transfascial stitches for approximately  every 3-4 cm of the mesh.  Following this, hemostasis was observed.  The  mesh was in good position.  There were no injuries noted on exploration  as well.  I then I removed the 11 mm trocar and closed this with a zero  Vicryl using the Endo Close.  All trocars were then removed.  The  abdomen was desufflated.  He tolerated this well.  All the incisions  were then closed with 4-0 Monocryl and Dermabond were placed over these.  I did place a piece of gauze and Tegaderm over his umbilicus in order to  try to get this to stick down as well.  He tolerated this procedure well  and was transferred to the recovery room in stable condition.      Troy Gosling, MD  Electronically Signed     MCW/MEDQ  D:  12/17/2008  T:  12/17/2008  Job:  [id number]   cc:   Troy Purpura, MD  Fax: [phone]   Troy Howard., MD  [address] [place]  [place] [address]

## 2011-04-17 NOTE — Discharge Summary (Signed)
Troy Howard, Troy Howard            ACCOUNT NO.:  [id number]   MEDICAL RECORD NO.:  [id number]          PATIENT TYPE:  INP   LOCATION:  1523                         FACILITY:  [facility]   PHYSICIAN:  Juanetta Gosling, MDDATE OF BIRTH:  [birth year]   DATE OF ADMISSION:  12/17/2008  DATE OF DISCHARGE:  12/19/2008                               DISCHARGE SUMMARY   ADMISSION DIAGNOSES:  1. Prostate cancer.  2. Incisional hernia.  3. Hypertension.  4. Gastroesophageal reflux disease.   DISCHARGE DIAGNOSES:  1. Prostate cancer.  2. Incisional hernia.  3. Hypertension.  4. Gastroesophageal reflux disease.  5. Laparoscopic lysis of adhesions with laparoscopic incisional hernia      repair with mesh.   DISCHARGE MEDICATIONS:  1. His regular medications which include Lipitor.  2. Nexium.  3. As well as Motrin 400 mg t.i.d. p.r.n.  4. Percocet as directed.   FOLLOWUP:  To see me in 2-3 weeks.   PROCEDURES PERFORMED:  A laparoscopic lysis of adhesions as well as  laparoscopic ventral hernia repair with a 25 x 25 cm Proceed for a 7 x 7  cm ventral hernia.   HISTORY AND HOSPITAL COURSE:  Mr. Gravlin is a 70 year old male who was  otherwise healthy and underwent a robotic assisted prostate cancer by  Dr. Laverle Patter in April who had a small umbilical hernia that was fixed  primarily at that time.  Since then he developed a bulge in his  umbilicus that has become larger over this time for which he would like  to have this evaluated and repaired.  I took him to the operating room  on January 18 and performed a laparoscopic lysis of adhesions for about  30 minutes and then a hernia repair laparoscopically with a 25 x 25 cm  Proceed.  Postoperatively he did well.  He had a migraine headache for  which he had some emesis.  This  was controlled with his usual medications including butalbital and  Imitrex.  Postoperative day 2 he was then discharged, tolerating a  regular diet, pain under control  and his migraine was under control and  follow up to see me in 2-3 weeks.      Juanetta Gosling, MD  Electronically Signed     MCW/MEDQ  D:  01/11/2009  T:  01/11/2009  Job:  [id number]

## 2011-04-17 NOTE — Op Note (Signed)
NAME:  Troy Howard, Troy Howard                     ACCOUNT NO.:  [id number]   MEDICAL RECORD NO.:  [id number]                   PATIENT TYPE:  OIB   LOCATION:                                       FACILITY:  [facility]   PHYSICIAN:  Payton Doughty, M.D.                   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   OPERATION PERFORMED:  Left L4-5 laminectomy and diskectomy.   SURGEON:  Payton Doughty, M.D.   ANESTHESIA:  General endotracheal.   PREP:  Sterile Betadine prep and scrub with alcohol wipe.   COMPLICATIONS:  None.   ASSISTANT:  1. Cristi Loron, M.D.  2. [name].   INDICATIONS FOR PROCEDURE:  The patient is a 70 year old gentleman with a  herniated disk at L4-5 on the left.   DESCRIPTION OF PROCEDURE:  He was taken to the operating room and smoothly  anesthetized and intubated and placed prone on the operating table.  Following shave, prep and drape in the usual sterile fashion, the skin was  infiltrated with 1% lidocaine with 1:400,000 epinephrine.  The skin was  incised from mid-L4 to the top of L5 and the lamina of L4 was exposed on the  left side in the subperiosteal plane.  Intraoperative x-ray confirmed  correctness of level.  Having confirmed correctness of level,  hemisemilaminectomy of L4 was carried out to the top of the ligamentum  flavum.  That was removed in a retrograde fashion.  The laminectomy was  gradually widened with a Kerrison until the lateral margin of the L5 nerve  root was visible.  It was dissected free and retracted medially exposing a  knuckle of herniated disk that protruded directly into the nerve root.  The  annular fibers were divided and the disk material removed.  The disk space  was carefully explored and all graspable fragments  were removed.  The nerve  root was explored and found to be free.  It was irrigated.  Depo-Medrol  soaked fat was used to cover the laminectomy defect.  The fascia was  reapproximated with 0 Vicryl in interrupted fashion.  Subcutaneous tissues  reapproximated with interrupted Vicryl in interrupted fashion.  Subcuticular  tissues reapproximated with 3-0 Vicryl in interrupted fashion.  Skin was  closed with 4-0 Vicryl in a running subcuticular fashion.  Benzoin and Steri-  Strips were placed and made occlusive with Telfa and Op-Site and the patient  returned to the recovery room in good condition.  Payton Doughty, M.D.   MWR/MEDQ  D:  10/23/2003  T:  10/23/2003  Job:  [id number]

## 2011-04-17 NOTE — Op Note (Signed)
NAMEJOEVON, Troy Howard            ACCOUNT NO.:  [id number]   MEDICAL RECORD NO.:  [id number]          PATIENT TYPE:  INP   LOCATION:  2899                         FACILITY:  [facility]   PHYSICIAN:  Payton Doughty, M.D.      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   OPERATIVE PROCEDURE:  L4-L5 and L5-S1 laminectomy, discectomy, posterior  lumbar interbody fusion with right sided fusion cages, segmental pedicle  screw instrumentation from L4 to S1, and posterolateral arthrodesis L4 to  S1.   SURGEON:  Payton Doughty, M.D.   SERVICE:  Neurosurgery   ANESTHESIA:  General endotracheal anesthesia.   PREPARATION:  Betadine and alcohol wipe.   COMPLICATIONS:  None.   ASSISTANT:  Danae Orleans. Venetia Maxon, M.D.   BODY OF TEXT:  This is a 70 year old gentleman with severe lumbar  spondylosis.  He is taken to the operating room, smoothly anesthetized and  intubated, placed prone on the operating table.  Following shave, prep, and  drape in the usual sterile fashion, the skin was infiltrated with 1%  lidocaine with 1:100,000 epinephrine.  The skin was incised from mid S1 to  mid L3 and the lamina and transverse processes of L4, L5, S1, and sacral ala  were exposed bilaterally in the subperiosteal plane.  Interoperative x-ray  confirmed correct level.  The pars intra-articularis, lamina, and inferior  facet of L4 and L5 were removed bilaterally.  On the left side, which had  previously been operated, the 5 root was identified and carefully dissected  free, however, it was significantly encumbered under the lamina of 5.  Therefore, the high speed drill was used to drill out the foramen and follow  the nerve distally across the disc space at 5-1 laterally and make sure it  is completely freed up.  The  nerve was not mobile and a ray cage could not  be placed on the left side at 4-5 or 5-1.  Right sided cages were placed  without difficulty, 12 by 21 mm.  They were packed with bone graft harvested  from the facet joint.  Pedicle screws were then placed in L4, L5, and S1  bilaterally and connected with the rods where the caps were locked.  The  transverse processes were decorticated with a high speed drill and BMP  placed on the extender matrix.  Interoperative x-ray confirmed good  placement of  the cages, pedicle screws, and rods.  The cages had previously been filled  with bone harvested from the facet joints.  The entire wound was closed in  layers with 0 Vicryl, 2-0 Vicryl, and 3-0 nylon.  Betadine and Telfa  dressing was applied and made occlusive with OpSite and the patient returned  to the recovery room in good condition.           ______________________________  Payton Doughty, M.D.     MWR/MEDQ  D:  11/04/2005  T:  11/04/2005  Job:  [id number]

## 2011-04-17 NOTE — H&P (Signed)
NAMELAREN, WHALING            ACCOUNT NO.:  [id number]   MEDICAL RECORD NO.:  [id number]          PATIENT TYPE:  INP   LOCATION:  2899                         FACILITY:  [facility]   PHYSICIAN:  Payton Doughty, M.D.      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   Very nice 70 year old right-handed white gentleman who in 2004 underwent a  diskectomy at L4-5.  Since then he has been having pain in his back, down  his left leg into his left hip.  He has undergone epidural steroids in 4-5.  L4 block did not do much for him.  L5 block relieved his pain for about  three weeks.  He has intractable pain in his back and down his left leg, is  now admitted for fusion at 4-5 and 5-1.   PAST MEDICAL HISTORY:  Otherwise unremarkable.  He has migraines.  Uses  Imitrex and Butazolidin for that episodically.  Has no allergies.  He also  uses hydrocodone on a p.r.n. basis.   SOCIAL HISTORY:  Does not smoke.  Drinks socially.  Has had no operations  other than his back.   FAMILY HISTORY:  Mom 60, in fair health, hypertension.  Father's health is  not given.   REVIEW OF SYSTEMS:  Remarkable for leg pain, leg weakness.   PHYSICAL EXAMINATION:  HEENT:  Within normal limits.  NECK:  Good range of motion.  CHEST:  Clear.  CARDIAC:  Regular rate and rhythm.  ABDOMEN:  Nontender.  No hepatosplenomegaly.  EXTREMITIES:  Without clubbing or cyanosis.  GENITOURINARY:  Deferred.  PERIPHERAL PULSES:  Good.  NEUROLOGIC:  He is awake, alert, and oriented.  His cranial nerves are  intact.  Motor examination shows 5/5 strength throughout the upper and lower  extremities save for the dorsiflexors of the left foot which are 5-/5 in his  left L5 sensory deficit.  Dysesthesias in the left L4 and L5 distribution.  Reflexes are 1 at the right knee, 2 at the left, 1 at the  ankles  bilaterally.  Straight leg raise is positive.   MR demonstrates severe scarring around the L4 and L5 roots on the left side.   CLINICAL IMPRESSION:  Lumbar spondylosis, intractable back and leg pain.   PLAN:  Lumbar laminectomy, diskectomy, posterior interbody lumbar fusion  with __________ fusion cages, pedicle screws, and posterolateral arthrodesis  at L4-5 and L5-S1.  The risks and benefits of this approach have been  discussed with him.  He wished to proceed.           ______________________________  Payton Doughty, M.D.     MWR/MEDQ  D:  11/04/2005  T:  11/04/2005  Job:  [id number]

## 2011-04-17 NOTE — Discharge Summary (Signed)
NAMEJOANATHAN, AFFELDT            ACCOUNT NO.:  [id number]   MEDICAL RECORD NO.:  [id number]          PATIENT TYPE:  INP   LOCATION:  3001                         FACILITY:  [facility]   PHYSICIAN:  Payton Doughty, M.D.      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   OPERATIVE PROCEDURE:  L4-L5 and L5-S1 laminectomy, discectomy, and posterior  lumbar interbody fusion with right sided fusion cages, segmental pedicle  screw fixation from L4 to S1, and posterolateral arthrodesis L4 to S1.   BODY OF TEXT:  This is a 70 year old right handed white gentleman whose  history and physical is recounted on the chart.  He had a discectomy in 2004  and has had progression of spondylosis and admitted for fusion.  Medical  history and surgical history are benign.  General exam is intact.  Neurologic exam was intact with a left L5 radiculopathy.  He was admitted  after ascertaining normal laboratory values and underwent a 4-5 and 5-1  fusion.  Postoperatively, he has done reasonably well.  He was very sore the  first day and could not get up out of bed, developed a migraine headache  which incapacitated him for several days.  When he was started back on his  Imitrex, this resolved.  Since then, he has been able to be up and about.  He has no left leg pain.  His incision is dry and well healing.  He is being  discharged home in the care of his family with intact strength, sensation,  and reflexes. His follow up will be in the [facility]  office in about ten days for sutures.           ______________________________  Payton Doughty, M.D.     MWR/MEDQ  D:  11/10/2005  T:  11/10/2005  Job:  [id number]

## 2011-04-17 NOTE — H&P (Signed)
NAME:  Troy Howard, Troy Howard                     ACCOUNT NO.:  [id number]   MEDICAL RECORD NO.:  [id number]                   PATIENT TYPE:  OIB   LOCATION:                                       FACILITY:  [facility]   PHYSICIAN:  Payton Doughty, M.D.                   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   HISTORY OF PRESENT ILLNESS:  This is a very nice, 70 year old, right-handed,  white gentleman I have been following for a couple of years for back pain  and a disk on the right side.  It has reached the point in intractability  and nonresponsive to steroids.  He presents for operative intervention.   PAST MEDICAL HISTORY:  Unremarkable.  He has migraines.   MEDICATIONS:  He uses Imitrex and Butazolidin on a p.r.n. basis.   ALLERGIES:  He has no allergies.   SOCIAL HISTORY:  He does not smoke or drink.   PAST SURGICAL HISTORY:  He has had no other operations.   FAMILY HISTORY:  His mom is 66 and in fair health with hypertension.  His  daddy's health is not given.   REVIEW OF SYSTEMS:  Remarkable for leg pain and leg weakness.   PHYSICAL EXAMINATION:  HEENT:  Within normal limits.  NECK:  He has good range of motion.  CHEST:  Clear.  CARDIAC:  Regular rhythm.  ABDOMEN:  Nontender with no hepatosplenomegaly.  EXTREMITIES:  Without clubbing or cyanosis.  Peripheral pulses are good.  GENITOURINARY:  Exam is deferred.  NEUROLOGIC:  He is awake, alert, and oriented.  His cranial nerves are  intact.  Motor exam shows 5/5 strength through the upper and lower  extremities.  Sensory deficit described in the left L5 distribution.  The  deep tendon reflexes are 2 at the knees, absent at the left ankle, and 1 at  the right.  Straight leg raise is positive.   LABORATORY DATA:  MR shows a herniated disk at L5-S1 eccentric to the left.   CLINICAL IMPRESSION:  Herniated disk at L4-5.   PLAN:  Lumbar laminectomy and diskectomy.  The risks and benefits of this  approach have been discussed with him and he wishes to proceed.                                                Payton Doughty, M.D.    MWR/MEDQ  D:  10/23/2003  T:  10/23/2003  Job:  [id number]

## 2011-04-23 ENCOUNTER — Other Ambulatory Visit: Payer: Self-pay | Admitting: Family Medicine

## 2011-05-14 ENCOUNTER — Other Ambulatory Visit: Payer: Self-pay | Admitting: Family Medicine

## 2011-05-14 DIAGNOSIS — N19 Unspecified kidney failure: Secondary | ICD-10-CM

## 2011-05-14 DIAGNOSIS — E785 Hyperlipidemia, unspecified: Secondary | ICD-10-CM

## 2011-05-20 ENCOUNTER — Other Ambulatory Visit (INDEPENDENT_AMBULATORY_CARE_PROVIDER_SITE_OTHER): Payer: Medicare Other

## 2011-05-20 DIAGNOSIS — N19 Unspecified kidney failure: Secondary | ICD-10-CM

## 2011-05-20 LAB — BASIC METABOLIC PANEL
BUN: 38 mg/dL — ABNORMAL HIGH (ref 6–23)
Chloride: 103 mEq/L (ref 96–112)
Creatinine, Ser: 1.5 mg/dL (ref 0.4–1.5)
Glucose, Bld: 79 mg/dL (ref 70–99)

## 2011-05-21 ENCOUNTER — Other Ambulatory Visit: Payer: Self-pay | Admitting: Family Medicine

## 2011-06-01 ENCOUNTER — Other Ambulatory Visit: Payer: Self-pay | Admitting: Family Medicine

## 2011-06-28 ENCOUNTER — Other Ambulatory Visit: Payer: Self-pay | Admitting: Family Medicine

## 2011-06-28 DIAGNOSIS — E291 Testicular hypofunction: Secondary | ICD-10-CM

## 2011-06-28 DIAGNOSIS — J40 Bronchitis, not specified as acute or chronic: Secondary | ICD-10-CM

## 2011-06-29 ENCOUNTER — Other Ambulatory Visit: Payer: Self-pay | Admitting: Family Medicine

## 2011-06-30 ENCOUNTER — Other Ambulatory Visit (INDEPENDENT_AMBULATORY_CARE_PROVIDER_SITE_OTHER): Payer: Medicare Other

## 2011-06-30 DIAGNOSIS — N5089 Other specified disorders of the male genital organs: Secondary | ICD-10-CM

## 2011-06-30 DIAGNOSIS — E349 Endocrine disorder, unspecified: Secondary | ICD-10-CM

## 2011-06-30 DIAGNOSIS — IMO0001 Reserved for inherently not codable concepts without codable children: Secondary | ICD-10-CM

## 2011-06-30 DIAGNOSIS — E291 Testicular hypofunction: Secondary | ICD-10-CM

## 2011-06-30 DIAGNOSIS — J40 Bronchitis, not specified as acute or chronic: Secondary | ICD-10-CM

## 2011-06-30 LAB — TESTOSTERONE: Testosterone: 245.08 ng/dL — ABNORMAL LOW (ref 350.00–890.00)

## 2011-06-30 LAB — HEMOGLOBIN A1C: Hgb A1c MFr Bld: 6.5 % (ref 4.6–6.5)

## 2011-06-30 MED ORDER — METHYLPREDNISOLONE ACETATE 80 MG/ML IJ SUSP
120.0000 mg | Freq: Once | INTRAMUSCULAR | Status: AC
  Administered 2011-06-30: 120 mg via INTRAMUSCULAR

## 2011-06-30 NOTE — Progress Notes (Signed)
Addended by: Azucena Freed on: 06/30/2011 08:30 AM   Modules accepted: Orders

## 2011-07-01 ENCOUNTER — Telehealth: Payer: Self-pay

## 2011-07-01 NOTE — Telephone Encounter (Signed)
Left a message for pt to return call on lab results.

## 2011-07-03 ENCOUNTER — Other Ambulatory Visit: Payer: Self-pay

## 2011-07-03 ENCOUNTER — Ambulatory Visit (INDEPENDENT_AMBULATORY_CARE_PROVIDER_SITE_OTHER)
Admission: RE | Admit: 2011-07-03 | Discharge: 2011-07-03 | Disposition: A | Discharge status: Home / Self Care | Payer: Medicare Other | Source: Ambulatory Visit | Attending: Family Medicine | Admitting: Family Medicine

## 2011-07-03 DIAGNOSIS — J40 Bronchitis, not specified as acute or chronic: Secondary | ICD-10-CM

## 2011-07-03 MED ORDER — TESTOSTERONE 20.25 MG/ACT (1.62%) TD GEL: 2.0000 | Freq: Every day | TRANSDERMAL | Status: DC

## 2011-07-03 NOTE — Telephone Encounter (Signed)
Per Dr. Scotty Court pt's prescription sent in to [facility] on [address].  1 act to each shoulder daily.

## 2011-07-07 ENCOUNTER — Other Ambulatory Visit: Payer: Self-pay | Admitting: Family Medicine

## 2011-07-07 DIAGNOSIS — R413 Other amnesia: Secondary | ICD-10-CM

## 2011-08-21 LAB — DIFFERENTIAL
Basophils Absolute: 0
Basophils Relative: 0
Eosinophils Absolute: 0
Eosinophils Relative: 0
Neutrophils Relative %: 93 — ABNORMAL HIGH

## 2011-08-21 LAB — CARDIAC PANEL(CRET KIN+CKTOT+MB+TROPI)
CK, MB: 1.2
Troponin I: 0.01

## 2011-08-21 LAB — URINALYSIS, ROUTINE W REFLEX MICROSCOPIC
Bilirubin Urine: NEGATIVE
Glucose, UA: NEGATIVE
Glucose, UA: NEGATIVE
Ketones, ur: NEGATIVE
Protein, ur: NEGATIVE
Urobilinogen, UA: 0.2
pH: 5.5

## 2011-08-21 LAB — CULTURE, BLOOD (ROUTINE X 2): Culture: NO GROWTH

## 2011-08-21 LAB — COMPREHENSIVE METABOLIC PANEL
AST: 16
BUN: 37 — ABNORMAL HIGH
CO2: 25
Chloride: 101
Creatinine, Ser: 1.79 — ABNORMAL HIGH
GFR calc Af Amer: 46 — ABNORMAL LOW
GFR calc non Af Amer: 38 — ABNORMAL LOW
Glucose, Bld: 142 — ABNORMAL HIGH
Total Bilirubin: 0.9

## 2011-08-21 LAB — RENAL FUNCTION PANEL
BUN: 31 — ABNORMAL HIGH
Chloride: 97
Glucose, Bld: 103 — ABNORMAL HIGH
Potassium: 3.7

## 2011-08-21 LAB — CBC
HCT: 43.7
MCV: 86.7
Platelets: 177
Platelets: 203
RBC: 4.24
RDW: 13.2
WBC: 7.8

## 2011-08-21 LAB — HEPATIC FUNCTION PANEL
ALT: 27
AST: 28
Bilirubin, Direct: 0.2
Indirect Bilirubin: 1.1 — ABNORMAL HIGH
Total Bilirubin: 1.3 — ABNORMAL HIGH

## 2011-08-21 LAB — BASIC METABOLIC PANEL
BUN: 36 — ABNORMAL HIGH
Creatinine, Ser: 1.79 — ABNORMAL HIGH
GFR calc non Af Amer: 38 — ABNORMAL LOW
Glucose, Bld: 111 — ABNORMAL HIGH
Potassium: 4.3

## 2011-08-21 LAB — STOOL CULTURE

## 2011-08-21 LAB — POCT CARDIAC MARKERS
CKMB, poc: 2
Myoglobin, poc: 107

## 2011-08-21 LAB — PROTIME-INR: Prothrombin Time: 13.3

## 2011-08-21 LAB — CK TOTAL AND CKMB (NOT AT ARMC)
CK, MB: 1.8
Relative Index: INVALID
Total CK: 82

## 2011-08-21 LAB — LIPID PANEL
Cholesterol: 150
LDL Cholesterol: 83
Triglycerides: 239 — ABNORMAL HIGH

## 2011-08-21 LAB — AMYLASE: Amylase: 96

## 2011-08-21 LAB — URINE CULTURE: Colony Count: 60000

## 2011-08-21 LAB — CMV ABS, IGG+IGM (CYTOMEGALOVIRUS): Cytomegalovirus Ab-IgG: 5.2 Index — ABNORMAL HIGH (ref <0.80)

## 2011-08-21 LAB — TSH: TSH: 2.501

## 2011-08-21 LAB — D-DIMER, QUANTITATIVE: D-Dimer, Quant: 0.26

## 2011-08-21 LAB — EBV AB TO VIRAL CAPSID AG PNL, IGG+IGM: EBV VCA IgM: 0.2

## 2011-08-25 LAB — CBC
MCV: 86.1
Platelets: 221
RBC: 4.77
WBC: 8.5

## 2011-08-25 LAB — HEMOGLOBIN AND HEMATOCRIT, BLOOD
HCT: 35.5 — ABNORMAL LOW
Hemoglobin: 12.5 — ABNORMAL LOW

## 2011-08-25 LAB — BASIC METABOLIC PANEL
CO2: 26
Calcium: 9.2
Creatinine, Ser: 1.43
Glucose, Bld: 99
Sodium: 136

## 2011-08-25 LAB — TYPE AND SCREEN
ABO/RH(D): A POS
Antibody Screen: NEGATIVE

## 2011-08-25 LAB — ABO/RH: ABO/RH(D): A POS

## 2011-11-23 ENCOUNTER — Other Ambulatory Visit: Payer: Self-pay | Admitting: Family Medicine

## 2012-01-01 ENCOUNTER — Other Ambulatory Visit: Payer: Self-pay | Admitting: Family Medicine

## 2012-01-04 NOTE — Telephone Encounter (Signed)
Last OV 11/13/2010. Last filled 11/23/11

## 2012-03-22 ENCOUNTER — Encounter: Payer: Self-pay | Admitting: Family Medicine

## 2012-03-22 ENCOUNTER — Ambulatory Visit (INDEPENDENT_AMBULATORY_CARE_PROVIDER_SITE_OTHER): Payer: Medicare Other | Admitting: Family Medicine

## 2012-03-22 VITALS — BP 114/80 | HR 78 | Temp 97.8°F | Ht 70.75 in | Wt 241.0 lb

## 2012-03-22 DIAGNOSIS — E785 Hyperlipidemia, unspecified: Secondary | ICD-10-CM

## 2012-03-22 DIAGNOSIS — N529 Male erectile dysfunction, unspecified: Secondary | ICD-10-CM

## 2012-03-22 DIAGNOSIS — Z8546 Personal history of malignant neoplasm of prostate: Secondary | ICD-10-CM

## 2012-03-22 DIAGNOSIS — G43909 Migraine, unspecified, not intractable, without status migrainosus: Secondary | ICD-10-CM

## 2012-03-22 DIAGNOSIS — E119 Type 2 diabetes mellitus without complications: Secondary | ICD-10-CM

## 2012-03-22 DIAGNOSIS — I10 Essential (primary) hypertension: Secondary | ICD-10-CM

## 2012-03-22 LAB — PSA: PSA: 0 ng/mL — ABNORMAL LOW (ref 0.10–4.00)

## 2012-03-22 LAB — LDL CHOLESTEROL, DIRECT: Direct LDL: 78.7 mg/dL

## 2012-03-22 LAB — POCT URINALYSIS DIPSTICK
Bilirubin, UA: NEGATIVE
Glucose, UA: NEGATIVE
Ketones, UA: NEGATIVE
Nitrite, UA: NEGATIVE
pH, UA: 5.5

## 2012-03-22 LAB — CBC WITH DIFFERENTIAL/PLATELET
Basophils Relative: 0.5 % (ref 0.0–3.0)
Eosinophils Relative: 4.8 % (ref 0.0–5.0)
Hemoglobin: 14.3 g/dL (ref 13.0–17.0)
Lymphocytes Relative: 33 % (ref 12.0–46.0)
MCHC: 33.7 g/dL (ref 30.0–36.0)
MCV: 89.9 fl (ref 78.0–100.0)
Neutro Abs: 4.3 10*3/uL (ref 1.4–7.7)
Neutrophils Relative %: 55.7 % (ref 43.0–77.0)
RBC: 4.7 Mil/uL (ref 4.22–5.81)
WBC: 7.7 10*3/uL (ref 4.5–10.5)

## 2012-03-22 LAB — LIPID PANEL
HDL: 28.7 mg/dL — ABNORMAL LOW (ref >39.00)
Total CHOL/HDL Ratio: 5
VLDL: 45 mg/dL — ABNORMAL HIGH (ref 0.0–40.0)

## 2012-03-22 LAB — BASIC METABOLIC PANEL
Chloride: 101 mEq/L (ref 96–112)
GFR: 48.87 mL/min — ABNORMAL LOW (ref >60.00)
Potassium: 4.5 mEq/L (ref 3.5–5.1)
Sodium: 137 mEq/L (ref 135–145)

## 2012-03-22 LAB — HEPATIC FUNCTION PANEL
AST: 24 U/L (ref 0–37)
Albumin: 4 g/dL (ref 3.5–5.2)
Alkaline Phosphatase: 82 U/L (ref 39–117)
Bilirubin, Direct: 0.1 mg/dL (ref 0.0–0.3)
Total Bilirubin: 0.5 mg/dL (ref 0.3–1.2)

## 2012-03-22 LAB — MICROALBUMIN / CREATININE URINE RATIO
Microalb Creat Ratio: 1.6 mg/g (ref 0.0–30.0)
Microalb, Ur: 1.1 mg/dL (ref 0.0–1.9)

## 2012-03-22 LAB — TESTOSTERONE: Testosterone: 209.68 ng/dL — ABNORMAL LOW (ref 350.00–890.00)

## 2012-03-22 MED ORDER — ESOMEPRAZOLE MAGNESIUM 40 MG PO CPDR: 40.0000 mg | DELAYED_RELEASE_CAPSULE | Freq: Every day | ORAL | Status: DC

## 2012-03-22 NOTE — Progress Notes (Signed)
  Subjective:    Patient ID: Troy Howard, male    DOB: 1941/04/07, 71 y.o.   MRN: [id number]  HPI 71 yr old male to establish with me after the retirement of Dr. Scotty Court. He also needs a cpx. He feels fine, but he admits to gaining 10 lbs in the past year. He works almost full time at [employer], and he plays golf once or twice a week.    Review of Systems  Constitutional: Negative.   HENT: Negative.   Eyes: Negative.   Respiratory: Negative.   Cardiovascular: Negative.   Gastrointestinal: Negative.   Genitourinary: Negative.   Musculoskeletal: Negative.   Skin: Negative.   Neurological: Negative.   Hematological: Negative.   Psychiatric/Behavioral: Negative.        Objective:   Physical Exam  Constitutional: He is oriented to person, place, and time. He appears well-developed and well-nourished. No distress.  HENT:  Head: Normocephalic and atraumatic.  Right Ear: External ear normal.  Left Ear: External ear normal.  Nose: Nose normal.  Mouth/Throat: Oropharynx is clear and moist. No oropharyngeal exudate.  Eyes: Conjunctivae and EOM are normal. Pupils are equal, round, and reactive to light. Right eye exhibits no discharge. Left eye exhibits no discharge. No scleral icterus.  Neck: Neck supple. No JVD present. No tracheal deviation present. No thyromegaly present.  Cardiovascular: Normal rate, regular rhythm, normal heart sounds and intact distal pulses.  Exam reveals no gallop and no friction rub.   No murmur heard.      EKG normal   Pulmonary/Chest: Effort normal and breath sounds normal. No respiratory distress. He has no wheezes. He has no rales. He exhibits no tenderness.  Abdominal: Soft. Bowel sounds are normal. He exhibits no distension and no mass. There is no tenderness. There is no rebound and no guarding.  Musculoskeletal: Normal range of motion. He exhibits no edema and no tenderness.  Lymphadenopathy:    He has no cervical adenopathy.  Neurological:  He is alert and oriented to person, place, and time. He has normal reflexes. No cranial nerve deficit. He exhibits normal muscle tone. Coordination normal.  Skin: Skin is warm and dry. No rash noted. He is not diaphoretic. No erythema. No pallor.  Psychiatric: He has a normal mood and affect. His behavior is normal. Judgment and thought content normal.          Assessment & Plan:  Well exam. Get fasting labs. He is to see Dr. Laverle Patter in a few weeks

## 2012-03-24 ENCOUNTER — Encounter: Payer: Self-pay | Admitting: Family Medicine

## 2012-03-24 NOTE — Progress Notes (Signed)
Quick Note:  Spoke to pt, put a copy of results in mail and faxed a copy to Dr. Laverle Patter. ______

## 2012-04-14 ENCOUNTER — Telehealth: Payer: Self-pay | Admitting: Family Medicine

## 2012-04-14 NOTE — Telephone Encounter (Signed)
Pt called and said that he has a possible uti. Pt is req to get a urine culture done. Periodic pain in groin area. Pls call.

## 2012-04-14 NOTE — Telephone Encounter (Signed)
Pt is sch for 04-15-2012 10.45am. Pt wife will inform pt of appt date and time

## 2012-04-14 NOTE — Telephone Encounter (Signed)
Pt is complaining of low abdomen, with dysuria and low abdomen. No fever, or chills.  History of Prostrate Cancer.  Pt does not want to come to the office for a visit, but he does have a Urologist.  Really does not want to see them either.  History of Prostate Cancer.  I will be out of the office most of the afternoon so please call pt.

## 2012-04-14 NOTE — Telephone Encounter (Signed)
He needs an OV for this  

## 2012-04-14 NOTE — Telephone Encounter (Signed)
Pt called requesting status of request below. Advised pt Dr Clent Ridges is out of the office this afternoon and it will be tomorrow before we can call him with a response. Pt reports symptoms are the same, no change. Advised pt to be seen at urgent care if he is unable to wait until tomorrow. Pt voices understanding and is aware that he may still be required to be seen in the office for Rx. Please advise.

## 2012-04-15 ENCOUNTER — Encounter: Payer: Self-pay | Admitting: Family Medicine

## 2012-04-15 ENCOUNTER — Ambulatory Visit (INDEPENDENT_AMBULATORY_CARE_PROVIDER_SITE_OTHER): Payer: Medicare Other | Admitting: Family Medicine

## 2012-04-15 VITALS — BP 120/72 | HR 95 | Temp 98.8°F | Wt 240.0 lb

## 2012-04-15 DIAGNOSIS — N390 Urinary tract infection, site not specified: Secondary | ICD-10-CM

## 2012-04-15 LAB — POCT URINALYSIS DIPSTICK
Ketones, UA: NEGATIVE
Spec Grav, UA: 1.025
Urobilinogen, UA: 0.2
pH, UA: 5

## 2012-04-15 MED ORDER — SULFAMETHOXAZOLE-TRIMETHOPRIM 800-160 MG PO TABS: 1.0000 | ORAL_TABLET | Freq: Two times a day (BID) | ORAL | Status: AC

## 2012-04-15 NOTE — Progress Notes (Signed)
Addended by: Aniceto Boss A on: 04/15/2012 01:04 PM   Modules accepted: Orders

## 2012-04-15 NOTE — Progress Notes (Signed)
  Subjective:    Patient ID: Troy Howard, male    DOB: Nov 09, 1941, 71 y.o.   MRN: [id number]  HPI Here for a recurrence of lower abdominal pains and urgency to urinate. He had the same symptoms about a month ago, and had a 10 day course of Cipro called in without being seen. He says these symptoms disappeared after only 2 days of the antibiotic. He did finish the course. He did well until 3 days ago when the same symptoms came back. No fever or nausea. He has a normal BM every morning.   Review of Systems  Constitutional: Negative.   Gastrointestinal: Positive for abdominal pain. Negative for nausea, vomiting, diarrhea, constipation, blood in stool, abdominal distention and rectal pain.  Genitourinary: Positive for urgency and frequency. Negative for dysuria, hematuria, flank pain and difficulty urinating.       Objective:   Physical Exam  Constitutional: He appears well-developed and well-nourished.  Abdominal: Soft. Bowel sounds are normal. He exhibits no distension and no mass. There is no rebound and no guarding.       Mild periumbilical tenderness . No hernias   Genitourinary: Rectum normal. Guaiac negative stool.       Testicles not tender          Assessment & Plan:  His symptoms sound like a UTI, although his UA today is clear. Treat with Bactrim DS. Culture the urine.  He is due to see Dr. Laverle Patter next week, so he can follow up then.

## 2012-04-17 LAB — URINE CULTURE: Colony Count: 7000

## 2012-04-18 NOTE — Progress Notes (Signed)
Quick Note:  I spoke with pt ______ 

## 2012-07-27 ENCOUNTER — Other Ambulatory Visit: Payer: Self-pay | Admitting: Family Medicine

## 2012-11-24 ENCOUNTER — Other Ambulatory Visit: Payer: Self-pay | Admitting: Family Medicine

## 2012-12-23 ENCOUNTER — Other Ambulatory Visit: Payer: Self-pay | Admitting: Family Medicine

## 2012-12-23 NOTE — Telephone Encounter (Signed)
Pt was given samples of benicar 20 mg by Dr Scotty Court. Pt would like new rx [facility]

## 2012-12-27 MED ORDER — OLMESARTAN MEDOXOMIL 20 MG PO TABS: 20.0000 mg | ORAL_TABLET | Freq: Every day | ORAL | Status: DC

## 2012-12-27 NOTE — Telephone Encounter (Signed)
Refill for a year 

## 2012-12-27 NOTE — Telephone Encounter (Signed)
Can we send this in ?

## 2012-12-27 NOTE — Telephone Encounter (Signed)
I sent script e-scribe. 

## 2013-01-07 ENCOUNTER — Other Ambulatory Visit: Payer: Self-pay | Admitting: Family Medicine

## 2013-01-17 ENCOUNTER — Telehealth: Payer: Self-pay | Admitting: Family Medicine

## 2013-01-17 MED ORDER — GLIPIZIDE 10 MG PO TABS: 10.0000 mg | ORAL_TABLET | Freq: Two times a day (BID) | ORAL | Status: DC

## 2013-01-17 NOTE — Telephone Encounter (Signed)
Refill request for Glipizide 10 mg and I did send e-scribe.

## 2013-03-16 ENCOUNTER — Encounter: Payer: Medicare Other | Admitting: Family Medicine

## 2013-03-23 ENCOUNTER — Encounter: Payer: Self-pay | Admitting: Family Medicine

## 2013-03-23 ENCOUNTER — Ambulatory Visit (INDEPENDENT_AMBULATORY_CARE_PROVIDER_SITE_OTHER): Payer: Medicare Other | Admitting: Family Medicine

## 2013-03-23 VITALS — BP 136/88 | HR 79 | Temp 98.1°F | Ht 69.75 in | Wt 249.0 lb

## 2013-03-23 DIAGNOSIS — E291 Testicular hypofunction: Secondary | ICD-10-CM

## 2013-03-23 DIAGNOSIS — E119 Type 2 diabetes mellitus without complications: Secondary | ICD-10-CM

## 2013-03-23 DIAGNOSIS — Z0000 Encounter for general adult medical examination without abnormal findings: Secondary | ICD-10-CM

## 2013-03-23 LAB — POCT URINALYSIS DIPSTICK
Ketones, UA: NEGATIVE
Protein, UA: NEGATIVE
Spec Grav, UA: 1.015

## 2013-03-23 LAB — LIPID PANEL
Total CHOL/HDL Ratio: 7
VLDL: 76.4 mg/dL — ABNORMAL HIGH (ref 0.0–40.0)

## 2013-03-23 LAB — HEPATIC FUNCTION PANEL
ALT: 31 U/L (ref 0–53)
AST: 21 U/L (ref 0–37)
Alkaline Phosphatase: 82 U/L (ref 39–117)
Bilirubin, Direct: 0 mg/dL (ref 0.0–0.3)
Total Bilirubin: 0.8 mg/dL (ref 0.3–1.2)

## 2013-03-23 LAB — BASIC METABOLIC PANEL
Chloride: 101 mEq/L (ref 96–112)
Creatinine, Ser: 1.5 mg/dL (ref 0.4–1.5)
Potassium: 4.2 mEq/L (ref 3.5–5.1)
Sodium: 135 mEq/L (ref 135–145)

## 2013-03-23 LAB — CBC WITH DIFFERENTIAL/PLATELET
Basophils Absolute: 0 10*3/uL (ref 0.0–0.1)
Eosinophils Relative: 2.6 % (ref 0.0–5.0)
HCT: 41.3 % (ref 39.0–52.0)
Hemoglobin: 13.9 g/dL (ref 13.0–17.0)
Lymphs Abs: 2.4 10*3/uL (ref 0.7–4.0)
Monocytes Relative: 8.7 % (ref 3.0–12.0)
Neutro Abs: 3.9 10*3/uL (ref 1.4–7.7)
RDW: 14.6 % (ref 11.5–14.6)

## 2013-03-23 LAB — TSH: TSH: 1 u[IU]/mL (ref 0.35–5.50)

## 2013-03-23 NOTE — Progress Notes (Signed)
  Subjective:    Patient ID: Troy Howard, male    DOB: 04/28/1941, 72 y.o.   MRN: [id number]  HPI 72 yr old male for a cpx. He feels well except for some joint pains that come and go. He is on his feet all day on his job. He takes Aleve at times. He has been off testosterone gel for 6 months and he is interested in checking a level today. He is not sure whether he felt any different while he was using this or not.    Review of Systems  Constitutional: Negative.   HENT: Negative.   Eyes: Negative.   Respiratory: Negative.   Cardiovascular: Negative.   Gastrointestinal: Negative.   Genitourinary: Negative.   Musculoskeletal: Negative.   Skin: Negative.   Neurological: Negative.   Psychiatric/Behavioral: Negative.        Objective:   Physical Exam  Constitutional: He is oriented to person, place, and time. He appears well-developed and well-nourished. No distress.  HENT:  Head: Normocephalic and atraumatic.  Right Ear: External ear normal.  Left Ear: External ear normal.  Nose: Nose normal.  Mouth/Throat: Oropharynx is clear and moist. No oropharyngeal exudate.  Eyes: Conjunctivae and EOM are normal. Pupils are equal, round, and reactive to light. Right eye exhibits no discharge. Left eye exhibits no discharge. No scleral icterus.  Neck: Neck supple. No JVD present. No tracheal deviation present. No thyromegaly present.  Cardiovascular: Normal rate, regular rhythm, normal heart sounds and intact distal pulses.  Exam reveals no gallop and no friction rub.   No murmur heard. EKG normal   Pulmonary/Chest: Effort normal and breath sounds normal. No respiratory distress. He has no wheezes. He has no rales. He exhibits no tenderness.  Abdominal: Soft. Bowel sounds are normal. He exhibits no distension and no mass. There is no tenderness. There is no rebound and no guarding.  Genitourinary: Rectum normal, prostate normal and penis normal. Guaiac negative stool. No penile  tenderness.  Musculoskeletal: Normal range of motion. He exhibits no edema and no tenderness.  Lymphadenopathy:    He has no cervical adenopathy.  Neurological: He is alert and oriented to person, place, and time. He has normal reflexes. No cranial nerve deficit. He exhibits normal muscle tone. Coordination normal.  Skin: Skin is warm and dry. No rash noted. He is not diaphoretic. No erythema. No pallor.  Psychiatric: He has a normal mood and affect. His behavior is normal. Judgment and thought content normal.          Assessment & Plan:  Well exam. Get fasting labs

## 2013-03-24 NOTE — Progress Notes (Signed)
Quick Note:  I spoke with pt ______ 

## 2013-07-26 ENCOUNTER — Other Ambulatory Visit: Payer: Self-pay | Admitting: Dermatology

## 2013-08-07 ENCOUNTER — Other Ambulatory Visit: Payer: Self-pay | Admitting: Family Medicine

## 2013-08-21 ENCOUNTER — Other Ambulatory Visit: Payer: Self-pay | Admitting: Family Medicine

## 2013-10-05 ENCOUNTER — Other Ambulatory Visit: Payer: Self-pay

## 2013-11-11 ENCOUNTER — Other Ambulatory Visit: Payer: Self-pay | Admitting: Family Medicine

## 2013-11-16 ENCOUNTER — Telehealth: Payer: Self-pay | Admitting: Family Medicine

## 2013-11-16 NOTE — Telephone Encounter (Signed)
[facility]ill request for [facility]

## 2013-11-17 MED ORDER — BUTALBITAL-APAP-CAFFEINE 50-325-40 MG PO TABS: 2.0000 | ORAL_TABLET | ORAL | Status: DC | PRN

## 2013-11-17 NOTE — Telephone Encounter (Signed)
Script was printed [facility]

## 2013-11-17 NOTE — Telephone Encounter (Signed)
Call in #[phone] with 2 rf 

## 2014-03-18 ENCOUNTER — Other Ambulatory Visit: Payer: Self-pay | Admitting: Family Medicine

## 2014-04-19 ENCOUNTER — Other Ambulatory Visit: Payer: Self-pay | Admitting: Family Medicine

## 2014-04-29 ENCOUNTER — Other Ambulatory Visit: Payer: Self-pay | Admitting: Family Medicine

## 2014-05-26 ENCOUNTER — Other Ambulatory Visit: Payer: Self-pay | Admitting: Family Medicine

## 2014-06-27 ENCOUNTER — Telehealth: Payer: Self-pay | Admitting: Family Medicine

## 2014-06-27 NOTE — Telephone Encounter (Signed)
[id number] [address] [place], [address] is requesting re-fill on atorvastatin (LIPITOR) 20 MG tablet

## 2014-06-28 MED ORDER — ATORVASTATIN CALCIUM 20 MG PO TABS: ORAL_TABLET | ORAL | Status: DC

## 2014-06-28 NOTE — Telephone Encounter (Signed)
I sent script e-scribe. 

## 2014-07-25 ENCOUNTER — Other Ambulatory Visit: Payer: Self-pay | Admitting: Family Medicine

## 2014-08-14 ENCOUNTER — Encounter: Payer: Self-pay | Admitting: Family Medicine

## 2014-08-14 ENCOUNTER — Ambulatory Visit (INDEPENDENT_AMBULATORY_CARE_PROVIDER_SITE_OTHER): Payer: Medicare Other | Admitting: Family Medicine

## 2014-08-14 VITALS — BP 141/71 | HR 88 | Temp 98.0°F | Ht 69.75 in | Wt 239.0 lb

## 2014-08-14 DIAGNOSIS — E119 Type 2 diabetes mellitus without complications: Secondary | ICD-10-CM

## 2014-08-14 DIAGNOSIS — Z23 Encounter for immunization: Secondary | ICD-10-CM

## 2014-08-14 DIAGNOSIS — I10 Essential (primary) hypertension: Secondary | ICD-10-CM

## 2014-08-14 DIAGNOSIS — Z0000 Encounter for general adult medical examination without abnormal findings: Secondary | ICD-10-CM

## 2014-08-14 DIAGNOSIS — F528 Other sexual dysfunction not due to a substance or known physiological condition: Secondary | ICD-10-CM

## 2014-08-14 LAB — HEPATIC FUNCTION PANEL
ALBUMIN: 4.1 g/dL (ref 3.5–5.2)
ALT: 37 U/L (ref 0–53)
AST: 21 U/L (ref 0–37)
Alkaline Phosphatase: 88 U/L (ref 39–117)
BILIRUBIN TOTAL: 1.3 mg/dL — AB (ref 0.2–1.2)
Bilirubin, Direct: 0.2 mg/dL (ref 0.0–0.3)
Total Protein: 7.1 g/dL (ref 6.0–8.3)

## 2014-08-14 LAB — BASIC METABOLIC PANEL
BUN: 28 mg/dL — AB (ref 6–23)
CALCIUM: 8.9 mg/dL (ref 8.4–10.5)
CO2: 26 mEq/L (ref 19–32)
CREATININE: 1.6 mg/dL — AB (ref 0.4–1.5)
Chloride: 102 mEq/L (ref 96–112)
GFR: 46.06 mL/min — AB (ref >60.00)
Glucose, Bld: 106 mg/dL — ABNORMAL HIGH (ref 70–99)
Potassium: 4.6 mEq/L (ref 3.5–5.1)
Sodium: 137 mEq/L (ref 135–145)

## 2014-08-14 LAB — TSH: TSH: 3.42 u[IU]/mL (ref 0.35–4.50)

## 2014-08-14 LAB — LIPID PANEL
CHOL/HDL RATIO: 9
CHOLESTEROL: 159 mg/dL (ref 0–200)
HDL: 17.1 mg/dL — AB (ref >39.00)
NONHDL: 141.9
VLDL: 90 mg/dL — AB (ref 0.0–40.0)

## 2014-08-14 LAB — HEMOGLOBIN A1C: HEMOGLOBIN A1C: 7 % — AB (ref 4.6–6.5)

## 2014-08-14 LAB — LDL CHOLESTEROL, DIRECT: LDL DIRECT: 72.6 mg/dL

## 2014-08-14 LAB — PSA: PSA: 0 ng/mL — AB (ref 0.10–4.00)

## 2014-08-14 LAB — TESTOSTERONE: Testosterone: 205.5 ng/dL — ABNORMAL LOW (ref 300.00–890.00)

## 2014-08-14 MED ORDER — OLMESARTAN MEDOXOMIL 20 MG PO TABS: ORAL_TABLET | ORAL | Status: DC

## 2014-08-14 MED ORDER — GLIPIZIDE 10 MG PO TABS: ORAL_TABLET | ORAL | Status: DC

## 2014-08-14 MED ORDER — ATORVASTATIN CALCIUM 20 MG PO TABS: ORAL_TABLET | ORAL | Status: DC

## 2014-08-14 MED ORDER — ESOMEPRAZOLE MAGNESIUM 20 MG PO CPDR: 20.0000 mg | DELAYED_RELEASE_CAPSULE | Freq: Every day | ORAL | Status: DC

## 2014-08-14 MED ORDER — TESTOSTERONE 20.25 MG/ACT (1.62%) TD GEL: 4.0000 | Freq: Every day | TRANSDERMAL | Status: DC

## 2014-08-14 MED ORDER — BUTALBITAL-APAP-CAFFEINE 50-325-40 MG PO TABS: 2.0000 | ORAL_TABLET | ORAL | Status: DC | PRN

## 2014-08-14 NOTE — Progress Notes (Signed)
Pre visit review using our clinic review tool, if applicable. No additional management support is needed unless otherwise documented below in the visit note. 

## 2014-08-14 NOTE — Progress Notes (Signed)
   Subjective:    Patient ID: Troy Howard, male    DOB: [birth year], 73 y.o.   MRN: [id number]  HPI 73 yr old male for a cpx. He feels great with the exception of some mild chronic fatigue. He has been off Androgel for several years and he wants to try this again.    Review of Systems  Constitutional: Positive for fatigue. Negative for fever, chills, diaphoresis, activity change, appetite change and unexpected weight change.  HENT: Negative.   Eyes: Negative.   Respiratory: Negative.   Cardiovascular: Negative.   Gastrointestinal: Negative.   Genitourinary: Negative.   Musculoskeletal: Negative.   Skin: Negative.   Neurological: Negative.   Psychiatric/Behavioral: Negative.        Objective:   Physical Exam  Constitutional: He is oriented to person, place, and time. He appears well-developed and well-nourished. No distress.  HENT:  Head: Normocephalic and atraumatic.  Right Ear: External ear normal.  Left Ear: External ear normal.  Nose: Nose normal.  Mouth/Throat: Oropharynx is clear and moist. No oropharyngeal exudate.  Eyes: Conjunctivae and EOM are normal. Pupils are equal, round, and reactive to light. Right eye exhibits no discharge. Left eye exhibits no discharge. No scleral icterus.  Neck: Neck supple. No JVD present. No tracheal deviation present. No thyromegaly present.  Cardiovascular: Normal rate, regular rhythm, normal heart sounds and intact distal pulses.  Exam reveals no gallop and no friction rub.   No murmur heard. EKG normal   Pulmonary/Chest: Effort normal and breath sounds normal. No respiratory distress. He has no wheezes. He has no rales. He exhibits no tenderness.  Abdominal: Soft. Bowel sounds are normal. He exhibits no distension and no mass. There is no tenderness. There is no rebound and no guarding.  Genitourinary: Rectum normal, prostate normal and penis normal. Guaiac negative stool. No penile tenderness.  Musculoskeletal: Normal range of  motion. He exhibits no edema and no tenderness.  Lymphadenopathy:    He has no cervical adenopathy.  Neurological: He is alert and oriented to person, place, and time. He has normal reflexes. No cranial nerve deficit. He exhibits normal muscle tone. Coordination normal.  Skin: Skin is warm and dry. No rash noted. He is not diaphoretic. No erythema. No pallor.  Psychiatric: He has a normal mood and affect. His behavior is normal. Judgment and thought content normal.          Assessment & Plan:  Well exam. Get fasting labs. Get back on Androgel at 4 pumps a day. We will get a baseline testosterone level today and recheck in 6 months

## 2014-08-14 NOTE — Addendum Note (Signed)
Addended by: Aggie Hacker A on: 08/14/2014 09:59 AM   Modules accepted: Orders

## 2014-08-15 LAB — CBC WITH DIFFERENTIAL/PLATELET
BASOS ABS: 0 10*3/uL (ref 0.0–0.1)
Basophils Relative: 0.5 % (ref 0.0–3.0)
Eosinophils Absolute: 0.4 10*3/uL (ref 0.0–0.7)
Eosinophils Relative: 4.5 % (ref 0.0–5.0)
HCT: 41.7 % (ref 39.0–52.0)
Hemoglobin: 14.1 g/dL (ref 13.0–17.0)
LYMPHS PCT: 31.9 % (ref 12.0–46.0)
Lymphs Abs: 2.5 10*3/uL (ref 0.7–4.0)
MCHC: 33.9 g/dL (ref 30.0–36.0)
MCV: 93.7 fl (ref 78.0–100.0)
MONOS PCT: 5.9 % (ref 3.0–12.0)
Monocytes Absolute: 0.5 10*3/uL (ref 0.1–1.0)
Neutro Abs: 4.5 10*3/uL (ref 1.4–7.7)
Neutrophils Relative %: 57.2 % (ref 43.0–77.0)
PLATELETS: 213 10*3/uL (ref 150.0–400.0)
RBC: 4.45 Mil/uL (ref 4.22–5.81)
RDW: 13.6 % (ref 11.5–15.5)
WBC: 7.8 10*3/uL (ref 4.0–10.5)

## 2014-08-15 LAB — MICROALBUMIN / CREATININE URINE RATIO
CREATININE, U: 256 mg/dL
MICROALB UR: 7.3 mg/dL — AB (ref 0.0–1.9)
MICROALB/CREAT RATIO: 2.9 mg/g (ref 0.0–30.0)

## 2014-08-29 ENCOUNTER — Telehealth: Payer: Self-pay | Admitting: Family Medicine

## 2014-08-29 NOTE — Telephone Encounter (Signed)
Pt request refill of the following:   glipiZIDE (GLUCOTROL) 10 MG tablet, atorvastatin (LIPITOR) 20 MG tablet     [facility] [address]

## 2014-08-30 NOTE — Telephone Encounter (Signed)
I spoke with pt and he has already picked up scripts, these were printed on 08/14/14 and given to pt while here in office.

## 2014-12-04 ENCOUNTER — Other Ambulatory Visit: Payer: Self-pay | Admitting: Family Medicine

## 2014-12-04 MED ORDER — OLMESARTAN MEDOXOMIL 20 MG PO TABS: ORAL_TABLET | ORAL | Status: DC

## 2014-12-04 NOTE — Telephone Encounter (Signed)
rx sent in electronically 

## 2014-12-04 NOTE — Telephone Encounter (Signed)
Pt can not afford name brand benicar 20 mg. Pt would like olmesartan 20 mg#30 w.refills  [facility]

## 2015-01-04 ENCOUNTER — Telehealth: Payer: Self-pay | Admitting: Family Medicine

## 2015-01-04 NOTE — Telephone Encounter (Signed)
PA for Benicar was denied.  Benicar in a non-formulary.  Patient must try and fail losartan AND valsartan first.

## 2015-01-07 NOTE — Telephone Encounter (Signed)
Change to Losartan 50 mg daily, send in one year supply

## 2015-01-08 MED ORDER — LOSARTAN POTASSIUM 50 MG PO TABS: 50.0000 mg | ORAL_TABLET | Freq: Every day | ORAL | Status: DC

## 2015-01-08 NOTE — Telephone Encounter (Signed)
I sent new script e-scribe, updated medication list and spoke with pt.

## 2015-01-08 NOTE — Telephone Encounter (Signed)
Pt following up on rx request. [facility] [address]  Pt has been out since 2/1 due to this issue.

## 2015-05-27 ENCOUNTER — Other Ambulatory Visit: Payer: Self-pay

## 2015-08-20 ENCOUNTER — Ambulatory Visit (INDEPENDENT_AMBULATORY_CARE_PROVIDER_SITE_OTHER): Payer: Medicare Other | Admitting: Family Medicine

## 2015-08-20 ENCOUNTER — Encounter: Payer: Self-pay | Admitting: Family Medicine

## 2015-08-20 VITALS — BP 144/90 | HR 79 | Temp 98.2°F | Ht 69.75 in | Wt 242.0 lb

## 2015-08-20 DIAGNOSIS — Z0000 Encounter for general adult medical examination without abnormal findings: Secondary | ICD-10-CM | POA: Diagnosis not present

## 2015-08-20 DIAGNOSIS — E119 Type 2 diabetes mellitus without complications: Secondary | ICD-10-CM | POA: Diagnosis not present

## 2015-08-20 DIAGNOSIS — I10 Essential (primary) hypertension: Secondary | ICD-10-CM

## 2015-08-20 DIAGNOSIS — Z23 Encounter for immunization: Secondary | ICD-10-CM | POA: Diagnosis not present

## 2015-08-20 DIAGNOSIS — Z86010 Personal history of colonic polyps: Secondary | ICD-10-CM

## 2015-08-20 LAB — POCT URINALYSIS DIPSTICK
Bilirubin, UA: NEGATIVE
Glucose, UA: NEGATIVE
KETONES UA: NEGATIVE
Leukocytes, UA: NEGATIVE
Nitrite, UA: NEGATIVE
SPEC GRAV UA: 1.02
Urobilinogen, UA: 0.2
pH, UA: 5.5

## 2015-08-20 LAB — HEPATIC FUNCTION PANEL
ALT: 25 U/L (ref 0–53)
AST: 16 U/L (ref 0–37)
Albumin: 4.2 g/dL (ref 3.5–5.2)
Alkaline Phosphatase: 99 U/L (ref 39–117)
BILIRUBIN TOTAL: 0.7 mg/dL (ref 0.2–1.2)
Bilirubin, Direct: 0.1 mg/dL (ref 0.0–0.3)
TOTAL PROTEIN: 7.4 g/dL (ref 6.0–8.3)

## 2015-08-20 LAB — CBC WITH DIFFERENTIAL/PLATELET
BASOS ABS: 0 10*3/uL (ref 0.0–0.1)
Basophils Relative: 0.5 % (ref 0.0–3.0)
Eosinophils Absolute: 0.2 10*3/uL (ref 0.0–0.7)
Eosinophils Relative: 3.2 % (ref 0.0–5.0)
HEMATOCRIT: 44.3 % (ref 39.0–52.0)
Hemoglobin: 14.9 g/dL (ref 13.0–17.0)
LYMPHS ABS: 3.2 10*3/uL (ref 0.7–4.0)
Lymphocytes Relative: 43.2 % (ref 12.0–46.0)
MCHC: 33.5 g/dL (ref 30.0–36.0)
MCV: 93.1 fl (ref 78.0–100.0)
MONO ABS: 0.5 10*3/uL (ref 0.1–1.0)
MONOS PCT: 7 % (ref 3.0–12.0)
Neutro Abs: 3.4 10*3/uL (ref 1.4–7.7)
Neutrophils Relative %: 46.1 % (ref 43.0–77.0)
PLATELETS: 208 10*3/uL (ref 150.0–400.0)
RBC: 4.76 Mil/uL (ref 4.22–5.81)
RDW: 13.4 % (ref 11.5–15.5)
WBC: 7.4 10*3/uL (ref 4.0–10.5)

## 2015-08-20 LAB — LDL CHOLESTEROL, DIRECT: LDL DIRECT: 44 mg/dL

## 2015-08-20 LAB — BASIC METABOLIC PANEL
BUN: 30 mg/dL — AB (ref 6–23)
CHLORIDE: 101 meq/L (ref 96–112)
CO2: 26 meq/L (ref 19–32)
CREATININE: 1.41 mg/dL (ref 0.40–1.50)
Calcium: 9 mg/dL (ref 8.4–10.5)
GFR: 52.38 mL/min — ABNORMAL LOW (ref >60.00)
GLUCOSE: 123 mg/dL — AB (ref 70–99)
POTASSIUM: 4.3 meq/L (ref 3.5–5.1)
Sodium: 134 mEq/L — ABNORMAL LOW (ref 135–145)

## 2015-08-20 LAB — LIPID PANEL
Cholesterol: 135 mg/dL (ref 0–200)
HDL: 19.1 mg/dL — AB (ref >39.00)
Total CHOL/HDL Ratio: 7
Triglycerides: 435 mg/dL — ABNORMAL HIGH (ref 0.0–149.0)

## 2015-08-20 LAB — TSH: TSH: 4.31 u[IU]/mL (ref 0.35–4.50)

## 2015-08-20 LAB — HEMOGLOBIN A1C: Hgb A1c MFr Bld: 7.3 % — ABNORMAL HIGH (ref 4.6–6.5)

## 2015-08-20 MED ORDER — ESOMEPRAZOLE MAGNESIUM 20 MG PO CPDR: 20.0000 mg | DELAYED_RELEASE_CAPSULE | Freq: Every day | ORAL | Status: DC

## 2015-08-20 MED ORDER — MELOXICAM 15 MG PO TABS: 15.0000 mg | ORAL_TABLET | Freq: Every day | ORAL | Status: DC

## 2015-08-20 MED ORDER — GLIPIZIDE 10 MG PO TABS: ORAL_TABLET | ORAL | Status: DC

## 2015-08-20 MED ORDER — BUTALBITAL-APAP-CAFFEINE 50-325-40 MG PO TABS: 2.0000 | ORAL_TABLET | ORAL | Status: DC | PRN

## 2015-08-20 MED ORDER — ATORVASTATIN CALCIUM 20 MG PO TABS: ORAL_TABLET | ORAL | Status: DC

## 2015-08-20 MED ORDER — LOSARTAN POTASSIUM 100 MG PO TABS: 100.0000 mg | ORAL_TABLET | Freq: Every day | ORAL | Status: DC

## 2015-08-20 MED ORDER — ZOLPIDEM TARTRATE 10 MG PO TABS: 10.0000 mg | ORAL_TABLET | Freq: Every evening | ORAL | Status: DC | PRN

## 2015-08-20 NOTE — Progress Notes (Signed)
   Subjective:    Patient ID: Troy Howard, male    DOB: 10-29-1941, 74 y.o.   MRN: [id number]  HPI 74 yr old male for a cpx. He feels well and has few concerns. His BP as crept up a bit from a year ago. His weight is stable. He plays golf twice a week. He does complain of joint pains, especially in both knees. He takes Ibuprofen most days with some relief.    Review of Systems  Constitutional: Negative.   HENT: Negative.   Eyes: Negative.   Respiratory: Negative.   Cardiovascular: Negative.   Gastrointestinal: Negative.   Genitourinary: Negative.   Musculoskeletal: Positive for arthralgias. Negative for myalgias, back pain, joint swelling, gait problem, neck pain and neck stiffness.  Skin: Negative.   Neurological: Negative.   Psychiatric/Behavioral: Negative.        Objective:   Physical Exam  Constitutional: He is oriented to person, place, and time. He appears well-developed and well-nourished. No distress.  HENT:  Head: Normocephalic and atraumatic.  Right Ear: External ear normal.  Left Ear: External ear normal.  Nose: Nose normal.  Mouth/Throat: Oropharynx is clear and moist. No oropharyngeal exudate.  Eyes: Conjunctivae and EOM are normal. Pupils are equal, round, and reactive to light. Right eye exhibits no discharge. Left eye exhibits no discharge. No scleral icterus.  Neck: Neck supple. No JVD present. No tracheal deviation present. No thyromegaly present.  Cardiovascular: Normal rate, regular rhythm, normal heart sounds and intact distal pulses.  Exam reveals no gallop and no friction rub.   No murmur heard. EKG normal   Pulmonary/Chest: Effort normal and breath sounds normal. No respiratory distress. He has no wheezes. He has no rales. He exhibits no tenderness.  Abdominal: Soft. Bowel sounds are normal. He exhibits no distension and no mass. There is no tenderness. There is no rebound and no guarding.  Musculoskeletal: Normal range of motion. He exhibits no  edema or tenderness.  Lymphadenopathy:    He has no cervical adenopathy.  Neurological: He is alert and oriented to person, place, and time. He has normal reflexes. No cranial nerve deficit. He exhibits normal muscle tone. Coordination normal.  Skin: Skin is warm and dry. No rash noted. He is not diaphoretic. No erythema. No pallor.  Psychiatric: He has a normal mood and affect. His behavior is normal. Judgment and thought content normal.          Assessment & Plan:  Well exam. Get fasting labs. 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 Increase Losartan to 100 mg a day to get the BP down. We reviewed diet and exercise advice. He is due for a colonoscopy this winter so we will send him back to GI. He will see Dr. Alinda Money next month for a urologic exam.

## 2015-08-20 NOTE — Progress Notes (Signed)
Pre visit review using our clinic review tool, if applicable. No additional management support is needed unless otherwise documented below in the visit note. 

## 2015-08-23 ENCOUNTER — Other Ambulatory Visit: Payer: Self-pay | Admitting: Family Medicine

## 2015-09-03 ENCOUNTER — Encounter: Payer: Self-pay | Admitting: Gastroenterology

## 2015-10-12 ENCOUNTER — Other Ambulatory Visit: Payer: Self-pay | Admitting: Family Medicine

## 2015-10-30 ENCOUNTER — Encounter: Payer: Self-pay | Admitting: Gastroenterology

## 2015-11-01 ENCOUNTER — Ambulatory Visit (AMBULATORY_SURGERY_CENTER): Payer: Self-pay | Admitting: *Deleted

## 2015-11-01 VITALS — Ht 71.0 in | Wt 240.0 lb

## 2015-11-01 DIAGNOSIS — Z86010 Personal history of colonic polyps: Secondary | ICD-10-CM

## 2015-11-01 NOTE — Progress Notes (Signed)
No egg or soy allergy. No anesthesia problems.  No home O2.  No diet meds.  Emmi was not given to pt, pt did not want

## 2015-11-14 ENCOUNTER — Ambulatory Visit (AMBULATORY_SURGERY_CENTER): Payer: Medicare Other | Admitting: Gastroenterology

## 2015-11-14 ENCOUNTER — Encounter: Payer: Self-pay | Admitting: Gastroenterology

## 2015-11-14 VITALS — BP 146/85 | HR 68 | Temp 96.4°F | Resp 22 | Ht 71.0 in | Wt 240.0 lb

## 2015-11-14 DIAGNOSIS — D125 Benign neoplasm of sigmoid colon: Secondary | ICD-10-CM

## 2015-11-14 DIAGNOSIS — Z800 Family history of malignant neoplasm of digestive organs: Secondary | ICD-10-CM

## 2015-11-14 DIAGNOSIS — D122 Benign neoplasm of ascending colon: Secondary | ICD-10-CM

## 2015-11-14 DIAGNOSIS — Z86010 Personal history of colonic polyps: Secondary | ICD-10-CM

## 2015-11-14 DIAGNOSIS — K635 Polyp of colon: Secondary | ICD-10-CM

## 2015-11-14 DIAGNOSIS — Z860101 Personal history of adenomatous and serrated colon polyps: Secondary | ICD-10-CM

## 2015-11-14 HISTORY — PX: COLONOSCOPY: SHX174

## 2015-11-14 LAB — GLUCOSE, CAPILLARY
GLUCOSE-CAPILLARY: 86 mg/dL (ref 65–99)
Glucose-Capillary: 100 mg/dL — ABNORMAL HIGH (ref 65–99)

## 2015-11-14 MED ORDER — SODIUM CHLORIDE 0.9 % IV SOLN: 500.0000 mL | INTRAVENOUS | Status: DC

## 2015-11-14 NOTE — Patient Instructions (Signed)
YOU HAD AN ENDOSCOPIC PROCEDURE TODAY AT THE [facility]:   Refer to the procedure report that was given to you for any specific questions about what was found during the examination.  If the procedure report does not answer your questions, please call your gastroenterologist to clarify.  If you requested that your care partner not be given the details of your procedure findings, then the procedure report has been included in a sealed envelope for you to review at your convenience later.  YOU SHOULD EXPECT: Some feelings of bloating in the abdomen. Passage of more gas than usual.  Walking can help get rid of the air that was put into your GI tract during the procedure and reduce the bloating. If you had a lower endoscopy (such as a colonoscopy or flexible sigmoidoscopy) you may notice spotting of blood in your stool or on the toilet paper. If you underwent a bowel prep for your procedure, you may not have a normal bowel movement for a few days.  Please Note:  You might notice some irritation and congestion in your nose or some drainage.  This is from the oxygen used during your procedure.  There is no need for concern and it should clear up in a day or so.  SYMPTOMS TO REPORT IMMEDIATELY:   Following lower endoscopy (colonoscopy or flexible sigmoidoscopy):  Excessive amounts of blood in the stool  Significant tenderness or worsening of abdominal pains  Swelling of the abdomen that is new, acute  Fever of 100F or higher   For urgent or emergent issues, a gastroenterologist can be reached at any hour by calling [phone].   DIET: Your first meal following the procedure should be a small meal and then it is ok to progress to your normal diet. Heavy or fried foods are harder to digest and may make you feel nauseous or bloated.  Likewise, meals heavy in dairy and vegetables can increase bloating.  Drink plenty of fluids but you should avoid alcoholic beverages for 24  hours.  ACTIVITY:  You should plan to take it easy for the rest of today and you should NOT DRIVE or use heavy machinery until tomorrow (because of the sedation medicines used during the test).    FOLLOW UP: Our staff will call the number listed on your records the next business day following your procedure to check on you and address any questions or concerns that you may have regarding the information given to you following your procedure. If we do not reach you, we will leave a message.  However, if you are feeling well and you are not experiencing any problems, there is no need to return our call.  We will assume that you have returned to your regular daily activities without incident.  If any biopsies were taken you will be contacted by phone or by letter within the next 1-3 weeks.  Please call us at [phone] if you have not heard about the biopsies in 3 weeks.    SIGNATURES/CONFIDENTIALITY: You and/or your care partner have signed paperwork which will be entered into your electronic medical record.  These signatures attest to the fact that that the information above on your After Visit Summary has been reviewed and is understood.  Full responsibility of the confidentiality of this discharge information lies with you and/or your care-partner. 

## 2015-11-14 NOTE — Op Note (Signed)
[facility]  [address] [place], [id number]   COLONOSCOPY PROCEDURE REPORT  PATIENT: Troy, Howard  MR#: [id number] BIRTHDATE: 1941-10-15 , 71  yrs. old GENDER: male ENDOSCOPIST: Ladene Artist, MD, [facility] PROCEDURE DATE:  11/14/2015 PROCEDURE:   Colonoscopy, surveillance and Colonoscopy with snare polypectomy First Screening Colonoscopy - Avg.  risk and is 50 yrs.  old or older - No.  Prior Negative Screening - Now for repeat screening. N/A  History of Adenoma - Now for follow-up colonoscopy & has been > or = to 3 yrs.  Yes hx of adenoma.  Has been 3 or more years since last colonoscopy.  Polyps removed today? Yes ASA CLASS:   Class II INDICATIONS:Surveillance due to prior colonic neoplasia and PH Colon Adenoma. MEDICATIONS: Monitored anesthesia care and Propofol 200 mg IV DESCRIPTION OF PROCEDURE:   After the risks benefits and alternatives of the procedure were thoroughly explained, informed consent was obtained.  The digital rectal exam revealed no abnormalities of the rectum.   The LB [id number]-H190 [id number]  endoscope was introduced through the anus and advanced to the cecum, which was identified by both the appendix and ileocecal valve. No adverse events experienced.   The quality of the prep was good.  (Suprep was used)  The instrument was then slowly withdrawn as the colon was fully examined. Estimated blood loss is zero unless otherwise noted in this procedure report.    COLON FINDINGS: Three sessile polyps measuring 5-6 mm in size were found in the sigmoid colon (2) and ascending colon (1). Polypectomies were performed with a cold snare.  The resection was complete, the polyp tissue was completely retrieved and sent to histology.   There was moderate diverticulosis noted in the sigmoid colon with associated luminal narrowing and muscular hypertrophy. The examination was otherwise normal.  Retroflexed views revealed internal Grade I hemorrhoids. The  time to cecum = 1.9 Withdrawal time = 11.4   The scope was withdrawn and the procedure completed. COMPLICATIONS: There were no immediate complications.  ENDOSCOPIC IMPRESSION: 1.   Three sessile polyps in the sigmoid colon and ascending colon; polypectomies performed with a cold snare 2.   Moderate diverticulosis noted in the sigmoid colon 3.   Grade l internal hemorrhoids  RECOMMENDATIONS: 1.  Await pathology results 2.  High fiber diet with liberal fluid intake. 3.  Repeat Colonoscopy in 5 years.  eSigned:  Ladene Artist, MD, [facility] 11/14/2015 8:58 AM

## 2015-11-14 NOTE — Progress Notes (Signed)
Called to room to assist during endoscopic procedure.  Patient ID and intended procedure confirmed with present staff. Received instructions for my participation in the procedure from the performing physician.  

## 2015-11-15 ENCOUNTER — Telehealth: Payer: Self-pay | Admitting: Emergency Medicine

## 2015-11-15 NOTE — Telephone Encounter (Signed)
  Follow up Call-  Call back number 11/14/2015  Post procedure Call Back phone  # [phone]  Permission to leave phone message Yes     Patient questions:  Do you have a fever, pain , or abdominal swelling? No. Pain Score  0 *  Have you tolerated food without any problems? Yes.    Have you been able to return to your normal activities? Yes.    Do you have any questions about your discharge instructions: Diet   No. Medications  No. Follow up visit  No.  Do you have questions or concerns about your Care? No.  Actions: * If pain score is 4 or above: No action needed, pain <4.

## 2015-11-22 ENCOUNTER — Encounter: Payer: Self-pay | Admitting: Gastroenterology

## 2015-12-01 DIAGNOSIS — N189 Chronic kidney disease, unspecified: Secondary | ICD-10-CM

## 2015-12-01 HISTORY — DX: Chronic kidney disease, unspecified: N18.9

## 2016-04-24 ENCOUNTER — Telehealth: Payer: Self-pay | Admitting: *Deleted

## 2016-04-24 NOTE — Telephone Encounter (Signed)
Received Prior Authorization request for butalbital-acetaminophen-caffeine (FIORICET, ESGIC)  ... [facility], Pharmisist states that pt pays cash for this medication as insurance will not cover.  No PA processed.

## 2016-08-12 ENCOUNTER — Other Ambulatory Visit: Payer: Self-pay | Admitting: Family Medicine

## 2016-08-13 NOTE — Telephone Encounter (Signed)
Rx refill sent to pharmacy. 

## 2016-08-26 DIAGNOSIS — Z85820 Personal history of malignant melanoma of skin: Secondary | ICD-10-CM | POA: Diagnosis not present

## 2016-08-26 DIAGNOSIS — L570 Actinic keratosis: Secondary | ICD-10-CM | POA: Diagnosis not present

## 2016-08-26 DIAGNOSIS — D485 Neoplasm of uncertain behavior of skin: Secondary | ICD-10-CM | POA: Diagnosis not present

## 2016-08-26 DIAGNOSIS — D2372 Other benign neoplasm of skin of left lower limb, including hip: Secondary | ICD-10-CM | POA: Diagnosis not present

## 2016-08-26 DIAGNOSIS — C44529 Squamous cell carcinoma of skin of other part of trunk: Secondary | ICD-10-CM | POA: Diagnosis not present

## 2016-08-26 DIAGNOSIS — L821 Other seborrheic keratosis: Secondary | ICD-10-CM | POA: Diagnosis not present

## 2016-09-16 ENCOUNTER — Encounter: Payer: Self-pay | Admitting: Family Medicine

## 2016-09-16 DIAGNOSIS — H5202 Hypermetropia, left eye: Secondary | ICD-10-CM | POA: Diagnosis not present

## 2016-09-16 DIAGNOSIS — H2513 Age-related nuclear cataract, bilateral: Secondary | ICD-10-CM | POA: Diagnosis not present

## 2016-09-16 DIAGNOSIS — H1789 Other corneal scars and opacities: Secondary | ICD-10-CM | POA: Diagnosis not present

## 2016-09-16 DIAGNOSIS — E119 Type 2 diabetes mellitus without complications: Secondary | ICD-10-CM | POA: Diagnosis not present

## 2016-09-16 DIAGNOSIS — H5211 Myopia, right eye: Secondary | ICD-10-CM | POA: Diagnosis not present

## 2016-09-16 DIAGNOSIS — Z7984 Long term (current) use of oral hypoglycemic drugs: Secondary | ICD-10-CM | POA: Diagnosis not present

## 2016-09-16 DIAGNOSIS — H52223 Regular astigmatism, bilateral: Secondary | ICD-10-CM | POA: Diagnosis not present

## 2016-09-16 DIAGNOSIS — H43813 Vitreous degeneration, bilateral: Secondary | ICD-10-CM | POA: Diagnosis not present

## 2016-09-16 LAB — HM DIABETES EYE EXAM

## 2016-10-01 ENCOUNTER — Ambulatory Visit (INDEPENDENT_AMBULATORY_CARE_PROVIDER_SITE_OTHER): Payer: PPO | Admitting: Family Medicine

## 2016-10-01 ENCOUNTER — Encounter: Payer: Self-pay | Admitting: Family Medicine

## 2016-10-01 VITALS — BP 128/76 | Temp 97.9°F | Ht 71.0 in | Wt 238.0 lb

## 2016-10-01 DIAGNOSIS — Z125 Encounter for screening for malignant neoplasm of prostate: Secondary | ICD-10-CM

## 2016-10-01 DIAGNOSIS — R7989 Other specified abnormal findings of blood chemistry: Secondary | ICD-10-CM

## 2016-10-01 DIAGNOSIS — I10 Essential (primary) hypertension: Secondary | ICD-10-CM

## 2016-10-01 DIAGNOSIS — E119 Type 2 diabetes mellitus without complications: Secondary | ICD-10-CM | POA: Diagnosis not present

## 2016-10-01 DIAGNOSIS — Z0000 Encounter for general adult medical examination without abnormal findings: Secondary | ICD-10-CM

## 2016-10-01 LAB — LIPID PANEL
CHOLESTEROL: 143 mg/dL (ref 0–200)
HDL: 18.6 mg/dL — ABNORMAL LOW (ref >39.00)
Total CHOL/HDL Ratio: 8

## 2016-10-01 LAB — CBC WITH DIFFERENTIAL/PLATELET
BASOS ABS: 0 10*3/uL (ref 0.0–0.1)
Basophils Relative: 0.6 % (ref 0.0–3.0)
Eosinophils Absolute: 0.2 10*3/uL (ref 0.0–0.7)
Eosinophils Relative: 3.9 % (ref 0.0–5.0)
HCT: 40.1 % (ref 39.0–52.0)
HEMOGLOBIN: 13.8 g/dL (ref 13.0–17.0)
LYMPHS ABS: 2.6 10*3/uL (ref 0.7–4.0)
Lymphocytes Relative: 41 % (ref 12.0–46.0)
MCHC: 34.5 g/dL (ref 30.0–36.0)
MCV: 92.3 fl (ref 78.0–100.0)
MONO ABS: 0.5 10*3/uL (ref 0.1–1.0)
MONOS PCT: 7.8 % (ref 3.0–12.0)
NEUTROS PCT: 46.7 % (ref 43.0–77.0)
Neutro Abs: 2.9 10*3/uL (ref 1.4–7.7)
Platelets: 179 10*3/uL (ref 150.0–400.0)
RBC: 4.35 Mil/uL (ref 4.22–5.81)
RDW: 13.7 % (ref 11.5–15.5)
WBC: 6.3 10*3/uL (ref 4.0–10.5)

## 2016-10-01 LAB — HEPATIC FUNCTION PANEL
ALBUMIN: 4.1 g/dL (ref 3.5–5.2)
ALT: 21 U/L (ref 0–53)
AST: 14 U/L (ref 0–37)
Alkaline Phosphatase: 86 U/L (ref 39–117)
Bilirubin, Direct: 0.1 mg/dL (ref 0.0–0.3)
TOTAL PROTEIN: 6.6 g/dL (ref 6.0–8.3)
Total Bilirubin: 0.8 mg/dL (ref 0.2–1.2)

## 2016-10-01 LAB — POC URINALSYSI DIPSTICK (AUTOMATED)
Bilirubin, UA: NEGATIVE
Glucose, UA: NEGATIVE
KETONES UA: NEGATIVE
LEUKOCYTES UA: NEGATIVE
Nitrite, UA: NEGATIVE
PH UA: 5
PROTEIN UA: NEGATIVE
UROBILINOGEN UA: 0.2

## 2016-10-01 LAB — BASIC METABOLIC PANEL
BUN: 25 mg/dL — AB (ref 6–23)
CALCIUM: 9 mg/dL (ref 8.4–10.5)
CO2: 29 mEq/L (ref 19–32)
CREATININE: 1.39 mg/dL (ref 0.40–1.50)
Chloride: 101 mEq/L (ref 96–112)
GFR: 53.09 mL/min — AB (ref >60.00)
GLUCOSE: 109 mg/dL — AB (ref 70–99)
Potassium: 4.6 mEq/L (ref 3.5–5.1)
Sodium: 136 mEq/L (ref 135–145)

## 2016-10-01 LAB — LDL CHOLESTEROL, DIRECT: LDL DIRECT: 44 mg/dL

## 2016-10-01 LAB — HEMOGLOBIN A1C: HEMOGLOBIN A1C: 7.1 % — AB (ref 4.6–6.5)

## 2016-10-01 LAB — PSA: PSA: 0 ng/mL — ABNORMAL LOW (ref 0.10–4.00)

## 2016-10-01 LAB — TSH: TSH: 5.04 u[IU]/mL — AB (ref 0.35–4.50)

## 2016-10-01 MED ORDER — BUTALBITAL-APAP-CAFFEINE 50-325-40 MG PO TABS: 2.0000 | ORAL_TABLET | ORAL | 5 refills | Status: DC | PRN

## 2016-10-01 MED ORDER — GLIPIZIDE 10 MG PO TABS: ORAL_TABLET | ORAL | 3 refills | Status: DC

## 2016-10-01 MED ORDER — ATORVASTATIN CALCIUM 20 MG PO TABS: 20.0000 mg | ORAL_TABLET | Freq: Every day | ORAL | 3 refills | Status: DC

## 2016-10-01 MED ORDER — MELOXICAM 15 MG PO TABS: 15.0000 mg | ORAL_TABLET | Freq: Every day | ORAL | 3 refills | Status: DC

## 2016-10-01 MED ORDER — ESOMEPRAZOLE MAGNESIUM 20 MG PO CPDR: 20.0000 mg | DELAYED_RELEASE_CAPSULE | Freq: Every day | ORAL | 3 refills | Status: DC

## 2016-10-01 MED ORDER — LOSARTAN POTASSIUM 100 MG PO TABS: 100.0000 mg | ORAL_TABLET | Freq: Every day | ORAL | 3 refills | Status: DC

## 2016-10-01 NOTE — Progress Notes (Signed)
   Subjective:    Patient ID: Troy Howard, male    DOB: 10/22/1941, 75 y.o.   MRN: [id number]  HPI 75 yr old male for a well exam. He feels great and has no concerns. He plans on retiring in the coming year.    Review of Systems  Constitutional: Negative.   HENT: Negative.   Eyes: Negative.   Respiratory: Negative.   Cardiovascular: Negative.   Gastrointestinal: Negative.   Genitourinary: Negative.   Musculoskeletal: Negative.   Skin: Negative.   Neurological: Negative.   Psychiatric/Behavioral: Negative.        Objective:   Physical Exam  Constitutional: He is oriented to person, place, and time. He appears well-developed and well-nourished. No distress.  HENT:  Head: Normocephalic and atraumatic.  Right Ear: External ear normal.  Left Ear: External ear normal.  Nose: Nose normal.  Mouth/Throat: Oropharynx is clear and moist. No oropharyngeal exudate.  Eyes: Conjunctivae and EOM are normal. Pupils are equal, round, and reactive to light. Right eye exhibits no discharge. Left eye exhibits no discharge. No scleral icterus.  Neck: Neck supple. No JVD present. No tracheal deviation present. No thyromegaly present.  Cardiovascular: Normal rate, regular rhythm, normal heart sounds and intact distal pulses.  Exam reveals no gallop and no friction rub.   No murmur heard. EKG normal   Pulmonary/Chest: Effort normal and breath sounds normal. No respiratory distress. He has no wheezes. He has no rales. He exhibits no tenderness.  Abdominal: Soft. Bowel sounds are normal. He exhibits no distension and no mass. There is no tenderness. There is no rebound and no guarding.  Genitourinary: Penis normal. No penile tenderness.  Musculoskeletal: Normal range of motion. He exhibits no edema or tenderness.  Lymphadenopathy:    He has no cervical adenopathy.  Neurological: He is alert and oriented to person, place, and time. He has normal reflexes. No cranial nerve deficit. He exhibits  normal muscle tone. Coordination normal.  Skin: Skin is warm and dry. No rash noted. He is not diaphoretic. No erythema. No pallor.  Psychiatric: He has a normal mood and affect. His behavior is normal. Judgment and thought content normal.          Assessment & Plan:  Well exam. We discussed diet and exercise. Get fasting labs today.  Laurey Morale, MD

## 2016-10-01 NOTE — Progress Notes (Signed)
Pre visit review using our clinic review tool, if applicable. No additional management support is needed unless otherwise documented below in the visit note. 

## 2016-10-05 ENCOUNTER — Other Ambulatory Visit: Payer: Self-pay | Admitting: Family Medicine

## 2016-10-05 MED ORDER — LEVOTHYROXINE SODIUM 50 MCG PO TABS: 50.0000 ug | ORAL_TABLET | Freq: Every day | ORAL | 3 refills | Status: DC

## 2016-12-28 ENCOUNTER — Other Ambulatory Visit (INDEPENDENT_AMBULATORY_CARE_PROVIDER_SITE_OTHER): Payer: PPO

## 2016-12-28 DIAGNOSIS — E039 Hypothyroidism, unspecified: Secondary | ICD-10-CM | POA: Diagnosis not present

## 2016-12-28 LAB — TSH: TSH: 5 u[IU]/mL — AB (ref 0.35–4.50)

## 2016-12-29 MED ORDER — LEVOTHYROXINE SODIUM 125 MCG PO TABS: 125.0000 ug | ORAL_TABLET | Freq: Every day | ORAL | 3 refills | Status: DC

## 2017-03-01 ENCOUNTER — Telehealth: Payer: Self-pay

## 2017-03-01 NOTE — Telephone Encounter (Signed)
PA approved, form faxed back to pharmacy. 

## 2017-03-01 NOTE — Telephone Encounter (Signed)
Received PA for Butalbital. PA submitted & is pending. Key: [id number]

## 2017-08-19 ENCOUNTER — Encounter: Payer: Self-pay | Admitting: Family Medicine

## 2017-08-26 DIAGNOSIS — D225 Melanocytic nevi of trunk: Secondary | ICD-10-CM | POA: Diagnosis not present

## 2017-08-26 DIAGNOSIS — D485 Neoplasm of uncertain behavior of skin: Secondary | ICD-10-CM | POA: Diagnosis not present

## 2017-08-26 DIAGNOSIS — L821 Other seborrheic keratosis: Secondary | ICD-10-CM | POA: Diagnosis not present

## 2017-08-26 DIAGNOSIS — Z85820 Personal history of malignant melanoma of skin: Secondary | ICD-10-CM | POA: Diagnosis not present

## 2017-08-26 DIAGNOSIS — D1801 Hemangioma of skin and subcutaneous tissue: Secondary | ICD-10-CM | POA: Diagnosis not present

## 2017-10-13 ENCOUNTER — Encounter: Payer: Self-pay | Admitting: Family Medicine

## 2017-10-13 ENCOUNTER — Ambulatory Visit (INDEPENDENT_AMBULATORY_CARE_PROVIDER_SITE_OTHER): Payer: PPO | Admitting: Family Medicine

## 2017-10-13 VITALS — BP 140/80 | HR 76 | Temp 98.1°F | Ht 70.75 in | Wt 235.0 lb

## 2017-10-13 DIAGNOSIS — Z0000 Encounter for general adult medical examination without abnormal findings: Secondary | ICD-10-CM | POA: Diagnosis not present

## 2017-10-13 DIAGNOSIS — E119 Type 2 diabetes mellitus without complications: Secondary | ICD-10-CM

## 2017-10-13 MED ORDER — BUTALBITAL-APAP-CAFFEINE 50-325-40 MG PO TABS: 2.0000 | ORAL_TABLET | ORAL | 5 refills | Status: DC | PRN

## 2017-10-13 NOTE — Progress Notes (Signed)
   Subjective:    Patient ID: Troy Howard, male    DOB: 1941-06-22, 76 y.o.   MRN: [id number]  HPI Here for a well exam. His only complaint is interrupted sleep at night. He wakes up 2-3 times a night and sometimes it is hard to get back asleep. He tried Ambien once some years ago and it caused a lot of side effects the next day.    Review of Systems  Constitutional: Negative.   HENT: Negative.   Eyes: Negative.   Respiratory: Negative.   Cardiovascular: Negative.   Gastrointestinal: Negative.   Genitourinary: Negative.   Musculoskeletal: Negative.   Skin: Negative.   Neurological: Negative.   Psychiatric/Behavioral: Positive for sleep disturbance. Negative for agitation, behavioral problems, confusion, decreased concentration, dysphoric mood, hallucinations, self-injury and suicidal ideas. The patient is not nervous/anxious and is not hyperactive.        Objective:   Physical Exam  Constitutional: He is oriented to person, place, and time. He appears well-developed and well-nourished. No distress.  HENT:  Head: Normocephalic and atraumatic.  Right Ear: External ear normal.  Left Ear: External ear normal.  Nose: Nose normal.  Mouth/Throat: Oropharynx is clear and moist. No oropharyngeal exudate.  Eyes: Conjunctivae and EOM are normal. Pupils are equal, round, and reactive to light. Right eye exhibits no discharge. Left eye exhibits no discharge. No scleral icterus.  Neck: Neck supple. No JVD present. No tracheal deviation present. No thyromegaly present.  Cardiovascular: Normal rate, regular rhythm, normal heart sounds and intact distal pulses. Exam reveals no gallop and no friction rub.  No murmur heard. Pulmonary/Chest: Effort normal and breath sounds normal. No respiratory distress. He has no wheezes. He has no rales. He exhibits no tenderness.  Abdominal: Soft. Bowel sounds are normal. He exhibits no distension and no mass. There is no tenderness. There is no rebound  and no guarding.  Genitourinary: Penis normal. No penile tenderness.  Musculoskeletal: Normal range of motion. He exhibits no edema or tenderness.  Lymphadenopathy:    He has no cervical adenopathy.  Neurological: He is alert and oriented to person, place, and time. He has normal reflexes. No cranial nerve deficit. He exhibits normal muscle tone. Coordination normal.  Skin: Skin is warm and dry. No rash noted. He is not diaphoretic. No erythema. No pallor.  Psychiatric: He has a normal mood and affect. His behavior is normal. Judgment and thought content normal.          Assessment & Plan:  Well exam. We discussed diet and exercise. He will set up fasting labs soon. Try OTC melatonin for sleep.  [name], MD

## 2017-10-15 ENCOUNTER — Other Ambulatory Visit (INDEPENDENT_AMBULATORY_CARE_PROVIDER_SITE_OTHER): Payer: PPO

## 2017-10-15 DIAGNOSIS — Z0000 Encounter for general adult medical examination without abnormal findings: Secondary | ICD-10-CM | POA: Diagnosis not present

## 2017-10-15 DIAGNOSIS — E119 Type 2 diabetes mellitus without complications: Secondary | ICD-10-CM | POA: Diagnosis not present

## 2017-10-15 LAB — BASIC METABOLIC PANEL
BUN: 28 mg/dL — ABNORMAL HIGH (ref 6–23)
CALCIUM: 9 mg/dL (ref 8.4–10.5)
CHLORIDE: 102 meq/L (ref 96–112)
CO2: 29 meq/L (ref 19–32)
Creatinine, Ser: 1.42 mg/dL (ref 0.40–1.50)
GFR: 51.65 mL/min — ABNORMAL LOW (ref >60.00)
GLUCOSE: 123 mg/dL — AB (ref 70–99)
POTASSIUM: 4.5 meq/L (ref 3.5–5.1)
SODIUM: 137 meq/L (ref 135–145)

## 2017-10-15 LAB — POC URINALSYSI DIPSTICK (AUTOMATED)
Bilirubin, UA: NEGATIVE
Glucose, UA: NEGATIVE
Ketones, UA: NEGATIVE
LEUKOCYTES UA: NEGATIVE
Nitrite, UA: NEGATIVE
PH UA: 6 (ref 5.0–8.0)
Spec Grav, UA: 1.025 (ref 1.010–1.025)
UROBILINOGEN UA: 0.2 U/dL

## 2017-10-15 LAB — HEPATIC FUNCTION PANEL
ALBUMIN: 4.1 g/dL (ref 3.5–5.2)
ALT: 20 U/L (ref 0–53)
AST: 14 U/L (ref 0–37)
Alkaline Phosphatase: 89 U/L (ref 39–117)
BILIRUBIN TOTAL: 0.8 mg/dL (ref 0.2–1.2)
Bilirubin, Direct: 0.1 mg/dL (ref 0.0–0.3)
Total Protein: 6.8 g/dL (ref 6.0–8.3)

## 2017-10-15 LAB — LIPID PANEL
CHOL/HDL RATIO: 8
Cholesterol: 153 mg/dL (ref 0–200)
HDL: 18.5 mg/dL — AB (ref >39.00)

## 2017-10-15 LAB — CBC WITH DIFFERENTIAL/PLATELET
BASOS ABS: 0 10*3/uL (ref 0.0–0.1)
Basophils Relative: 0.6 % (ref 0.0–3.0)
EOS ABS: 0.2 10*3/uL (ref 0.0–0.7)
Eosinophils Relative: 3.3 % (ref 0.0–5.0)
HCT: 43.1 % (ref 39.0–52.0)
Hemoglobin: 14.6 g/dL (ref 13.0–17.0)
LYMPHS ABS: 2.6 10*3/uL (ref 0.7–4.0)
LYMPHS PCT: 37.8 % (ref 12.0–46.0)
MCHC: 33.9 g/dL (ref 30.0–36.0)
MCV: 97 fl (ref 78.0–100.0)
MONOS PCT: 6.4 % (ref 3.0–12.0)
Monocytes Absolute: 0.4 10*3/uL (ref 0.1–1.0)
NEUTROS ABS: 3.5 10*3/uL (ref 1.4–7.7)
NEUTROS PCT: 51.9 % (ref 43.0–77.0)
PLATELETS: 197 10*3/uL (ref 150.0–400.0)
RBC: 4.44 Mil/uL (ref 4.22–5.81)
RDW: 13.9 % (ref 11.5–15.5)
WBC: 6.8 10*3/uL (ref 4.0–10.5)

## 2017-10-15 LAB — PSA: PSA: 0 ng/mL — AB (ref 0.10–4.00)

## 2017-10-15 LAB — TSH: TSH: 2.56 u[IU]/mL (ref 0.35–4.50)

## 2017-10-15 LAB — HEMOGLOBIN A1C: HEMOGLOBIN A1C: 7.8 % — AB (ref 4.6–6.5)

## 2017-10-15 LAB — LDL CHOLESTEROL, DIRECT: LDL DIRECT: 41 mg/dL

## 2017-10-20 ENCOUNTER — Other Ambulatory Visit: Payer: Self-pay | Admitting: *Deleted

## 2017-10-20 ENCOUNTER — Telehealth: Payer: Self-pay | Admitting: Family Medicine

## 2017-10-20 MED ORDER — FENOFIBRATE 160 MG PO TABS: 160.0000 mg | ORAL_TABLET | Freq: Every day | ORAL | 3 refills | Status: DC

## 2017-10-20 MED ORDER — METFORMIN HCL 500 MG PO TABS: 500.0000 mg | ORAL_TABLET | Freq: Two times a day (BID) | ORAL | 3 refills | Status: DC

## 2017-10-20 NOTE — Telephone Encounter (Signed)
Patient informed of test results- he is aware of new medications ordered and will follow up with labs in 3 months.

## 2017-10-31 ENCOUNTER — Other Ambulatory Visit: Payer: Self-pay | Admitting: Family Medicine

## 2017-10-31 DIAGNOSIS — Z0000 Encounter for general adult medical examination without abnormal findings: Secondary | ICD-10-CM

## 2017-11-06 ENCOUNTER — Other Ambulatory Visit: Payer: Self-pay | Admitting: Family Medicine

## 2017-11-06 DIAGNOSIS — Z0000 Encounter for general adult medical examination without abnormal findings: Secondary | ICD-10-CM

## 2017-11-11 ENCOUNTER — Other Ambulatory Visit: Payer: Self-pay | Admitting: Family Medicine

## 2017-11-11 ENCOUNTER — Telehealth: Payer: Self-pay | Admitting: Family Medicine

## 2017-11-11 NOTE — Telephone Encounter (Signed)
Copied from [place] [id number]. Topic: Quick Communication - See Telephone Encounter >> Nov 11, 2017 10:26 AM Synthia Innocent wrote: CRM for notification. See Telephone encounter for:  Patient has a med question about  metFORMIN (GLUCOPHAGE) 500 MG tablet and fenofibrate 160 MG tablet. Is this a double dose before, duplicate? Patient confused on this 11/11/17.

## 2017-11-11 NOTE — Telephone Encounter (Signed)
Sent to PCP ?

## 2017-11-11 NOTE — Telephone Encounter (Signed)
Pt called voicing concerns with medications recently prescribed, which are Metformin and Fenofibrate. Pt wanted to make sure he was not taking two medications to treat the same thing. Pt was recently prescribed Fenofibrate and is currently taking Atorvastatin, and wants to know if he needs to be taking both of the medications.

## 2017-11-11 NOTE — Telephone Encounter (Signed)
Dr. Fry - Please advise. Thanks! 

## 2017-11-12 NOTE — Telephone Encounter (Signed)
Yes he should be taking all 3 meds. Metformin is for diabetes, atorvastatin is for cholesterol, and fenofibrate is for triglycerides

## 2017-11-12 NOTE — Telephone Encounter (Signed)
Called and spoke to pt. Pt advised and voiced understanding.  

## 2017-12-23 ENCOUNTER — Other Ambulatory Visit: Payer: Self-pay | Admitting: Family Medicine

## 2017-12-24 NOTE — Telephone Encounter (Signed)
Call pt's pharmacy

## 2017-12-24 NOTE — Telephone Encounter (Signed)
Last OV 10/13/2017  TSH last tested 10/15/2017  Rx last refilled 12/29/2016 disp 90 with 3 refills

## 2018-02-21 ENCOUNTER — Ambulatory Visit: Payer: Self-pay | Admitting: *Deleted

## 2018-02-21 NOTE — Telephone Encounter (Signed)
Pt  Is  Awake  And  Alert  He   Actually  Played  Golf today . He denies  Any  Chest  Pain or  Shortness  Of breath  .  He  Has  Some  Vague  Weakness  As  Well   As .  Some  dizziness  When he  Bends  Over  And  Looks  Down .   He has  Had  Symptoms   For  Over  1   Week .  V/S   At  1530   99.7   Temp   bp  121/71  Pulse  Is  90  Appt  Made  For tommorow  wuth  Dr Sarajane Jews   He  Was  Advised  To call  911  If  Any chest pain or  Shortness of breath     And  Any increase  In  Symptoms  He  Was  encouraged  Lots  Of fluids  .       Reason for Disposition . [1] MODERATE weakness (i.e., interferes with work, school, normal activities) AND [2] persists > 3 days  Answer Assessment - Initial Assessment Questions 1. DESCRIPTION: "Describe how you are feeling."       WEAK   FATIGUE  NO  ENERGY   2. SEVERITY: "How bad is it?"  "Can you stand and walk?"   - MILD - Feels weak or tired, but does not interfere with work, school or normal activities   - Tryon to stand and walk; weakness interferes with work, school, or normal activities   - SEVERE - Unable to stand or walk       MODERATE   3. ONSET:  "When did the weakness begin?"      10  DAYS   4. CAUSE: "What do you think is causing the weakness?"        NO    5. MEDICINES: "Have you recently started a new medicine or had a change in the amount of a medicine?"          NO     6. OTHER SYMPTOMS: "Do you have any other symptoms?" (e.g., chest pain, fever, cough, SOB, vomiting, diarrhea, bleeding)      LOW   GRADE   FEVER   CLAMMY    4  DAYS  AGO     DIZZY STARTED     OVER 1   WEEK  AGO    HAD  EPISODE  TODAY        7. PREGNANCY: "Is there any chance you are pregnant?" "When was your last menstrual period?"     N/A  Protocols used: WEAKNESS (GENERALIZED) AND FATIGUE-A-AH

## 2018-02-22 ENCOUNTER — Ambulatory Visit (INDEPENDENT_AMBULATORY_CARE_PROVIDER_SITE_OTHER): Payer: PPO | Admitting: Family Medicine

## 2018-02-22 ENCOUNTER — Encounter: Payer: Self-pay | Admitting: Family Medicine

## 2018-02-22 VITALS — BP 118/62 | HR 79 | Temp 97.9°F | Wt 233.2 lb

## 2018-02-22 DIAGNOSIS — E559 Vitamin D deficiency, unspecified: Secondary | ICD-10-CM

## 2018-02-22 DIAGNOSIS — E785 Hyperlipidemia, unspecified: Secondary | ICD-10-CM

## 2018-02-22 DIAGNOSIS — E039 Hypothyroidism, unspecified: Secondary | ICD-10-CM

## 2018-02-22 DIAGNOSIS — R0602 Shortness of breath: Secondary | ICD-10-CM | POA: Diagnosis not present

## 2018-02-22 DIAGNOSIS — E119 Type 2 diabetes mellitus without complications: Secondary | ICD-10-CM | POA: Diagnosis not present

## 2018-02-22 DIAGNOSIS — N289 Disorder of kidney and ureter, unspecified: Secondary | ICD-10-CM | POA: Diagnosis not present

## 2018-02-22 DIAGNOSIS — E538 Deficiency of other specified B group vitamins: Secondary | ICD-10-CM | POA: Diagnosis not present

## 2018-02-22 DIAGNOSIS — I10 Essential (primary) hypertension: Secondary | ICD-10-CM

## 2018-02-22 LAB — POC URINALSYSI DIPSTICK (AUTOMATED)
BILIRUBIN UA: NEGATIVE
Blood, UA: NEGATIVE
KETONES UA: NEGATIVE
Leukocytes, UA: NEGATIVE
Nitrite, UA: NEGATIVE
PH UA: 5.5 (ref 5.0–8.0)
Spec Grav, UA: 1.025 (ref 1.010–1.025)
Urobilinogen, UA: 0.2 E.U./dL

## 2018-02-22 NOTE — Progress Notes (Signed)
   Subjective:    Patient ID: Troy Howard, male    DOB: 16-Nov-1941, 77 y.o.   MRN: [id number]  HPI Here with his wife for several spells in the past 10 days of chest pressure and SOB on exertion, of feeling clammy and weak, and feeling lightheaded. His BP has been stable. He does not have a glucometer at home but his wife notes that he often skips meals. Apparently he had a cardiac catheterization some years ago showing mild CAD, but I cannot find records of this.    Review of Systems  Constitutional: Negative.   Respiratory: Positive for shortness of breath. Negative for cough, choking and wheezing.   Cardiovascular: Positive for chest pain. Negative for palpitations and leg swelling.  Gastrointestinal: Negative.   Neurological: Positive for light-headedness. Negative for dizziness, seizures, syncope, facial asymmetry, speech difficulty, weakness, numbness and headaches.       Objective:   Physical Exam  Constitutional: He is oriented to person, place, and time. He appears well-developed and well-nourished. No distress.  Eyes: Pupils are equal, round, and reactive to light. Conjunctivae and EOM are normal.  Neck: No thyromegaly present.  Cardiovascular: Normal rate, regular rhythm, normal heart sounds and intact distal pulses.  Pulmonary/Chest: Effort normal and breath sounds normal. No respiratory distress. He has no wheezes. He has no rales.  Abdominal: Soft. Bowel sounds are normal. He exhibits no distension and no mass. There is no tenderness. There is no rebound and no guarding.  Lymphadenopathy:    He has no cervical adenopathy.  Neurological: He is alert and oriented to person, place, and time.          Assessment & Plan:  He has been having spells that sound like hypoglycemia to me. I asked him to never skip a meal and to eat snacks in between meals. I wrote for him to get his own glucometer and to begin recording his blood glucose values at least twice a day. He  will follow up in 2 weeks and will bring a log of these values. We will also set him up for a perfusion stress test. Get labs today including a thyroid panel.  [name], MD

## 2018-02-23 LAB — CBC WITH DIFFERENTIAL/PLATELET
BASOS ABS: 0 10*3/uL (ref 0.0–0.1)
Basophils Relative: 0.6 % (ref 0.0–3.0)
EOS ABS: 0.5 10*3/uL (ref 0.0–0.7)
Eosinophils Relative: 6.7 % — ABNORMAL HIGH (ref 0.0–5.0)
HCT: 37.1 % — ABNORMAL LOW (ref 39.0–52.0)
Hemoglobin: 12.7 g/dL — ABNORMAL LOW (ref 13.0–17.0)
LYMPHS ABS: 2.7 10*3/uL (ref 0.7–4.0)
Lymphocytes Relative: 39.8 % (ref 12.0–46.0)
MCHC: 34.3 g/dL (ref 30.0–36.0)
MCV: 95.5 fl (ref 78.0–100.0)
Monocytes Absolute: 0.5 10*3/uL (ref 0.1–1.0)
Monocytes Relative: 7.7 % (ref 3.0–12.0)
NEUTROS PCT: 45.2 % (ref 43.0–77.0)
Neutro Abs: 3 10*3/uL (ref 1.4–7.7)
Platelets: 263 10*3/uL (ref 150.0–400.0)
RBC: 3.89 Mil/uL — AB (ref 4.22–5.81)
RDW: 13.5 % (ref 11.5–15.5)
WBC: 6.7 10*3/uL (ref 4.0–10.5)

## 2018-02-23 LAB — BASIC METABOLIC PANEL
BUN: 34 mg/dL — AB (ref 6–23)
CALCIUM: 9.2 mg/dL (ref 8.4–10.5)
CHLORIDE: 100 meq/L (ref 96–112)
CO2: 26 mEq/L (ref 19–32)
CREATININE: 2 mg/dL — AB (ref 0.40–1.50)
GFR: 34.75 mL/min — ABNORMAL LOW (ref >60.00)
Glucose, Bld: 253 mg/dL — ABNORMAL HIGH (ref 70–99)
Potassium: 4 mEq/L (ref 3.5–5.1)
Sodium: 135 mEq/L (ref 135–145)

## 2018-02-23 LAB — HEPATIC FUNCTION PANEL
ALT: 25 U/L (ref 0–53)
AST: 18 U/L (ref 0–37)
Albumin: 3.9 g/dL (ref 3.5–5.2)
Alkaline Phosphatase: 49 U/L (ref 39–117)
Bilirubin, Direct: 0.1 mg/dL (ref 0.0–0.3)
Total Bilirubin: 0.3 mg/dL (ref 0.2–1.2)
Total Protein: 6.8 g/dL (ref 6.0–8.3)

## 2018-02-23 LAB — T3, FREE: T3 FREE: 2.8 pg/mL (ref 2.3–4.2)

## 2018-02-23 LAB — TSH: TSH: 2.59 u[IU]/mL (ref 0.35–4.50)

## 2018-02-23 LAB — T4, FREE: FREE T4: 0.99 ng/dL (ref 0.60–1.60)

## 2018-02-23 LAB — VITAMIN B12: Vitamin B-12: 161 pg/mL — ABNORMAL LOW (ref 211–911)

## 2018-02-23 LAB — VITAMIN D 25 HYDROXY (VIT D DEFICIENCY, FRACTURES): VITD: 23.5 ng/mL — AB (ref 30.00–100.00)

## 2018-02-23 LAB — HEMOGLOBIN A1C: HEMOGLOBIN A1C: 6.8 % — AB (ref 4.6–6.5)

## 2018-02-28 NOTE — Addendum Note (Signed)
Addended by: [name] A on: 02/28/2018 08:12 AM   Modules accepted: Orders

## 2018-03-01 ENCOUNTER — Telehealth: Payer: Self-pay | Admitting: Family Medicine

## 2018-03-01 NOTE — Telephone Encounter (Signed)
Charted in result notes. 

## 2018-03-01 NOTE — Telephone Encounter (Signed)
On hold for triage 6 minutes. Please call pt back with lab results. Ph# [phone]  Copied from [place]. Topic: Quick Communication - Lab Results >> Mar 01, 2018 10:09 AM Myriam Forehand, [place] wrote: Called patient to inform them of  lab results. When patient returns call, triage nurse may disclose results.

## 2018-03-11 ENCOUNTER — Telehealth: Payer: Self-pay

## 2018-03-11 NOTE — Telephone Encounter (Signed)
Copied from [place] [id number]. Topic: General - Other >> Mar 07, 2018  1:29 PM Oneta Rack wrote: Relation to pt: self Call back [phone]   Reason for call:  Patient checking on the status of stress test orders, please advise  >> Mar 07, 2018  1:34 PM Oneta Rack wrote: Relation to pt: self Call back [phone]   Reason for call:  Patient checking on the status of stress test orders, please advise

## 2018-03-14 NOTE — Telephone Encounter (Signed)
Could you please find out why this was not set up as ordered on 02-22-18? Thanks

## 2018-03-14 NOTE — Telephone Encounter (Signed)
Sent to PCP it looks like orders were placed for MYOCARDIAL PERFUSION IMAGING back in March but no orders placed for stress test. Sent to PCP

## 2018-03-16 ENCOUNTER — Telehealth: Payer: Self-pay | Admitting: Family Medicine

## 2018-03-16 DIAGNOSIS — Z0000 Encounter for general adult medical examination without abnormal findings: Secondary | ICD-10-CM

## 2018-03-16 NOTE — Telephone Encounter (Signed)
Last OV 02/22/2018   Last refilled 10/13/2017 disp 100 with 5 refills   Sent to PCP to advise

## 2018-03-16 NOTE — Telephone Encounter (Signed)
Copied from [place] [phone]. Topic: Quick Communication - Rx Refill/Question >> Mar 16, 2018  4:17 PM Margot Ables wrote: Medication: butalbital-acetaminophen-caffeine Emelda Brothers, [facility]) [phone] MG tablet - per Kathrene Bongo last filled in Dec 2018 - requested electronically 03/11/18 Has the patient contacted their pharmacy? Yes - RX expired Preferred Pharmacy (with phone number or street name): [id number] - [place], [address] [place] [phone] (Phone) [phone] (Fax)

## 2018-03-17 MED ORDER — BUTALBITAL-APAP-CAFFEINE 50-325-40 MG PO TABS: 2.0000 | ORAL_TABLET | ORAL | 5 refills | Status: DC | PRN

## 2018-03-17 NOTE — Telephone Encounter (Signed)
Call in #[phone] with 5 rf 

## 2018-03-17 NOTE — Telephone Encounter (Signed)
Rx has been called into pt's pharmacy.  

## 2018-03-25 ENCOUNTER — Telehealth: Payer: Self-pay | Admitting: *Deleted

## 2018-03-25 NOTE — Telephone Encounter (Signed)
Prior auth for Butalbital-APAP-Caffeine 50-325-40mg  was sent to [email or web address].

## 2018-03-28 DIAGNOSIS — I129 Hypertensive chronic kidney disease with stage 1 through stage 4 chronic kidney disease, or unspecified chronic kidney disease: Secondary | ICD-10-CM | POA: Diagnosis not present

## 2018-03-28 DIAGNOSIS — K219 Gastro-esophageal reflux disease without esophagitis: Secondary | ICD-10-CM | POA: Diagnosis not present

## 2018-03-28 DIAGNOSIS — Z8546 Personal history of malignant neoplasm of prostate: Secondary | ICD-10-CM | POA: Diagnosis not present

## 2018-03-28 DIAGNOSIS — E785 Hyperlipidemia, unspecified: Secondary | ICD-10-CM | POA: Diagnosis not present

## 2018-03-28 DIAGNOSIS — E1122 Type 2 diabetes mellitus with diabetic chronic kidney disease: Secondary | ICD-10-CM | POA: Diagnosis not present

## 2018-03-28 DIAGNOSIS — N183 Chronic kidney disease, stage 3 (moderate): Secondary | ICD-10-CM | POA: Diagnosis not present

## 2018-04-04 ENCOUNTER — Other Ambulatory Visit: Payer: Self-pay | Admitting: Nephrology

## 2018-04-04 DIAGNOSIS — E1329 Other specified diabetes mellitus with other diabetic kidney complication: Secondary | ICD-10-CM

## 2018-04-04 DIAGNOSIS — I129 Hypertensive chronic kidney disease with stage 1 through stage 4 chronic kidney disease, or unspecified chronic kidney disease: Secondary | ICD-10-CM

## 2018-04-04 DIAGNOSIS — N183 Chronic kidney disease, stage 3 unspecified: Secondary | ICD-10-CM

## 2018-04-14 ENCOUNTER — Ambulatory Visit
Admission: RE | Admit: 2018-04-14 | Discharge: 2018-04-14 | Disposition: A | Discharge status: Home / Self Care | Payer: PPO | Source: Ambulatory Visit | Attending: Nephrology | Admitting: Nephrology

## 2018-04-14 DIAGNOSIS — E1329 Other specified diabetes mellitus with other diabetic kidney complication: Secondary | ICD-10-CM

## 2018-04-14 DIAGNOSIS — N183 Chronic kidney disease, stage 3 unspecified: Secondary | ICD-10-CM

## 2018-04-14 DIAGNOSIS — I129 Hypertensive chronic kidney disease with stage 1 through stage 4 chronic kidney disease, or unspecified chronic kidney disease: Secondary | ICD-10-CM

## 2018-04-22 ENCOUNTER — Encounter: Payer: Self-pay | Admitting: Hematology

## 2018-04-22 ENCOUNTER — Telehealth: Payer: Self-pay | Admitting: Family Medicine

## 2018-04-22 ENCOUNTER — Telehealth: Payer: Self-pay | Admitting: Hematology

## 2018-04-22 NOTE — Telephone Encounter (Signed)
Received a referral from Dr. Madelon Lips from [facility] for a dx of +m spike. Pt has been scheduled for the pt to see Dr. Irene Limbo on 6/5 at 11am. Pt aware to arrive 30 minutes early. Letter mailed to the pt.

## 2018-04-22 NOTE — Telephone Encounter (Signed)
Make an OV so we can discuss all this

## 2018-04-22 NOTE — Telephone Encounter (Signed)
Copied from [place] [id number]. Topic: Quick Communication - See Telephone Encounter >> Apr 22, 2018  9:32 AM Aurelio Brash B wrote: CRM for notification. See Telephone encounter for: 04/22/18.  PT states Dr Hollie Salk has taken him off both statins and wants to know if there is something else he can take   PT also states he has changed his eating habits -  and has lot 15 lbs - and his bp  has been running low and wants to know if he should continue to take bp med  , he stopped taking  and his bp was good  today.   [id number] [place], [place] [place] [phone] (Phone) [phone] (Fax)

## 2018-04-22 NOTE — Telephone Encounter (Signed)
Sent to PCP to advise 

## 2018-04-26 NOTE — Telephone Encounter (Signed)
Called and spoke with pt. Pt stated that he has an appt. with blood specialist would like to see them first then have them send over the results to Dr. Sarajane Jews then he will make his appt with PCP to discuss everything that is going on.   FYI pt stated that his Kidney doctor wanted him to see the blood specialist for labs.

## 2018-05-03 NOTE — Progress Notes (Signed)
HEMATOLOGY/ONCOLOGY CONSULTATION NOTE  Date of Service: 05/04/2018  Patient Care Team: Laurey Morale, MD as PCP - General (Family Medicine)  CHIEF COMPLAINTS/PURPOSE OF CONSULTATION:  Positive M-Spike   HISTORY OF PRESENTING ILLNESS:   Troy Howard 78 y.o. male is a here because of positive M-spike. The patient was referred by nephrologist Dr. Hollie Salk. The patient presents to the clinic today accompanied by his wife.  He is being seen by nephrologist who pursued further work up to why his Creatinine has increased. Upon workup his M-protein was found to be 0.8g/dl.  He has had HTN and DM for many years that can affects his Kidney function and was thought to be the likely cause of his CKD.   Today his wife notes he has had muscle pain, muscle mass loss and fatigue.   He has HTN, DM, GERD, Hypothyroid, Hyperlipidemia. He was recently taken off statins, meloxicam, ASA and metformin. He takes Nexium for her GERD and has been on for many years.   On review of symptoms, pt notes purposeful weight loss, muscle loss and muscle pain. He also has leg pain from b/l knees to anterior shin.   MEDICAL HISTORY:  Past Medical History:  Diagnosis Date  . Arthritis    back  . Cancer [facility])    prostate, sees Dr. Raynelle Bring   . Diabetes mellitus   . Erectile dysfunction   . GERD (gastroesophageal reflux disease)   . Hyperlipidemia   . Hypertension   . Migraines   . Peptic ulcer     SURGICAL HISTORY: Past Surgical History:  Procedure Laterality Date  . APPENDECTOMY  1950  . COLONOSCOPY  11/14/2015   per Dr. Fuller Plan, adenomatous polyps, repeat in 5 yrs   . HERNIA REPAIR     umbilical   . MENISCUS REPAIR Left   . PROSTATE SURGERY  2009   radical prostatectomy  . SPINE SURGERY     L4-L5 fusion per Dr. Glenna Fellows   . TONSILLECTOMY AND ADENOIDECTOMY  1954    SOCIAL HISTORY: Social History   Socioeconomic History  . Marital status: Married    Spouse name: Not on file  . Number  of children: Not on file  . Years of education: Not on file  . Highest education level: Not on file  Occupational History  . Not on file  Social Needs  . Financial resource strain: Not on file  . Food insecurity:    Worry: Not on file    Inability: Not on file  . Transportation needs:    Medical: Not on file    Non-medical: Not on file  Tobacco Use  . Smoking status: Never Smoker  . Smokeless tobacco: Never Used  Substance and Sexual Activity  . Alcohol use: Yes    Alcohol/week: 0.0 oz    Comment: rare  . Drug use: No  . Sexual activity: Not on file  Lifestyle  . Physical activity:    Days per week: Not on file    Minutes per session: Not on file  . Stress: Not on file  Relationships  . Social connections:    Talks on phone: Not on file    Gets together: Not on file    Attends religious service: Not on file    Active member of club or organization: Not on file    Attends meetings of clubs or organizations: Not on file    Relationship status: Not on file  . Intimate partner violence:  Fear of current or ex partner: Not on file    Emotionally abused: Not on file    Physically abused: Not on file    Forced sexual activity: Not on file  Other Topics Concern  . Not on file  Social History Narrative  . Not on file    FAMILY HISTORY: Family History  Problem Relation Age of Onset  . Hypertension Unknown   . Cancer Unknown   . Colon cancer Neg Hx     ALLERGIES:  has No Known Allergies.  MEDICATIONS:  Current Outpatient Medications  Medication Sig Dispense Refill  . aspirin 81 MG tablet Take 81 mg by mouth daily.    Marland Kitchen atorvastatin (LIPITOR) 20 MG tablet TAKE 1 TABLET BY MOUTH EVERY DAY 90 tablet 3  . butalbital-acetaminophen-caffeine (FIORICET, ESGIC) 50-325-40 MG tablet Take 2 tablets by mouth every 4 (four) hours as needed for headache. 100 tablet 5  . esomeprazole (NEXIUM) 20 MG capsule Take 1 capsule (20 mg total) by mouth daily at 12 noon. 90 capsule 3  .  fenofibrate 160 MG tablet Take 1 tablet (160 mg total) by mouth daily. 90 tablet 3  . glipiZIDE (GLUCOTROL) 10 MG tablet TAKE 1 TABLET BY MOUTH TWICE A DAY BEFORE A MEAL 180 tablet 3  . levothyroxine (SYNTHROID, LEVOTHROID) 125 MCG tablet TAKE 1 TABLET (125 MCG TOTAL) BY MOUTH DAILY. 90 tablet 1  . losartan (COZAAR) 100 MG tablet TAKE 1 TABLET BY MOUTH EVERY DAY 90 tablet 2  . meloxicam (MOBIC) 15 MG tablet TAKE 1 TABLET BY MOUTH EVERY DAY 90 tablet 3  . metFORMIN (GLUCOPHAGE) 500 MG tablet Take 1 tablet (500 mg total) by mouth 2 (two) times daily with a meal. 180 tablet 3   No current facility-administered medications for this visit.     REVIEW OF SYSTEMS:   .10 Point review of Systems was done is negative except as noted above.   PHYSICAL EXAMINATION: ECOG PERFORMANCE STATUS: 1 - Symptomatic but completely ambulatory  . Vitals:   05/04/18 1101  BP: 110/67  Pulse: 80  Resp: 18  Temp: 98.3 F (36.8 C)  SpO2: 97%   Filed Weights   05/04/18 1101  Weight: 223 lb 8 oz (101.4 kg)   .Body mass index is 31.17 kg/m.  GENERAL:alert, in no acute distress and comfortable SKIN: no acute rashes, no significant lesions EYES: conjunctiva are pink and non-injected, sclera anicteric OROPHARYNX: MMM, no exudates, no oropharyngeal erythema or ulceration NECK: supple, no JVD LYMPH:  no palpable lymphadenopathy in the cervical, axillary or inguinal regions LUNGS: clear to auscultation b/l with normal respiratory effort HEART: regular rate & rhythm ABDOMEN:  normoactive bowel sounds , non tender, not distended. Extremity: no pedal edema PSYCH: alert & oriented x 3 with fluent speech NEURO: no focal motor/sensory deficits  LABORATORY DATA:  I have reviewed the data as listed  . CBC Latest Ref Rng & Units 05/04/2018 02/22/2018 10/15/2017  WBC 4.0 - 10.3 K/uL 6.2 6.7 6.8  Hemoglobin 13.0 - 17.1 g/dL 14.2 12.7(L) 14.6  Hematocrit 38.4 - 49.9 % 41.3 37.1(L) 43.1  Platelets 140 - 400 K/uL 216  263.0 197.0   . CBC    Component Value Date/Time   WBC 6.2 05/04/2018 1240   RBC 4.38 05/04/2018 1240   HGB 14.2 05/04/2018 1240   HCT 41.3 05/04/2018 1240   PLT 216 05/04/2018 1240   MCV 94.3 05/04/2018 1240   MCH 32.4 05/04/2018 1240   MCHC 34.4 05/04/2018 1240  RDW 13.4 05/04/2018 1240   LYMPHSABS 2.5 05/04/2018 1240   MONOABS 0.5 05/04/2018 1240   EOSABS 0.3 05/04/2018 1240   BASOSABS 0.0 05/04/2018 1240    . CMP Latest Ref Rng & Units 05/04/2018 02/22/2018 10/15/2017  Glucose 70 - 140 mg/dL 69(L) 253(H) 123(H)  BUN 7 - 26 mg/dL 35(H) 34(H) 28(H)  Creatinine 0.70 - 1.30 mg/dL 1.61(H) 2.00(H) 1.42  Sodium 136 - 145 mmol/L 137 135 137  Potassium 3.5 - 5.1 mmol/L 4.5 4.0 4.5  Chloride 98 - 109 mmol/L 104 100 102  CO2 22 - 29 mmol/L '24 26 29  '$ Calcium 8.4 - 10.4 mg/dL 9.4 9.2 9.0  Total Protein 6.4 - 8.3 g/dL 7.8 6.8 6.8  Total Bilirubin 0.2 - 1.2 mg/dL 0.4 0.3 0.8  Alkaline Phos 40 - 150 U/L 89 49 89  AST 5 - 34 U/L '17 18 14  '$ ALT 0 - 55 U/L 32 25 20   Component     Latest Ref Rng & Units 05/04/2018  Iron     42 - 163 ug/dL 75  TIBC     202 - 409 ug/dL 255  Saturation Ratios     42 - 163 % 29 (L)  UIBC     ug/dL 180  LDH     125 - 245 U/L 143  Sed Rate     0 - 16 mm/hr 20 (H)  Ferritin     22 - 316 ng/mL 167  Intrinsic Factor     0.0 - 1.1 AU/mL 1.0  Parietal Cell Antibody-IgG     0.0 - 20.0 Units 1.9      OUTSIDE LABS 04/21/18       RADIOGRAPHIC STUDIES: I have personally reviewed the radiological images as listed and agreed with the findings in the report. US Renal  Result Date: 04/14/2018 CLINICAL DATA:  Chronic kidney disease stage 3. EXAM: RENAL / URINARY TRACT ULTRASOUND COMPLETE COMPARISON:  None. FINDINGS: Right Kidney: Length: 11.4 cm. Echogenicity within normal limits. No mass or hydronephrosis visualized. Left Kidney: Length: 12.1 cm. Echogenicity within normal limits. No hydronephrosis visualized. 1.5 cm simple cyst is seen in midpole  cortex. Bladder: Appears normal for degree of bladder distention. IMPRESSION: No significant renal abnormality seen. Electronically Signed   By: Marijo Conception, M.D.   On: 04/14/2018 16:10    ASSESSMENT & PLAN:   Troy Howard is a 77 y.o. caucasian male with    1. IgG Lambda monoclonal paraproteinemia  He has a M-spike of 0.8g/dl and normal SFLC and ratio. No anemia - nl Hgb No focal bone pains --- Skeletal survey pending. No hypercalcemia. 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  PLAN   -I discussed the abnormal labs, and the potential meaning and etiologies of increase in M-spike and criteria for evaluation of plasma cell dyscrasias with patient and his wife.  -available data - not suggestive of myeloma at this time. -I discussed his HTN and DM and past NSAID use are the more obvious causes to his elevation of Creatine and CKD (renal function better on labs today).  -His US Renal from 04/14/18 showed no renal abnormality/urinary obstruction along with 1 simple cyst of left kidney.  -I recommend further evaluation of lab work to gain baseline for IgG lambda, kappa light chains, and a  24 hour urine study (UPEP). - I also recommend a whole bone scan. He is agreeable.  -Unless this workup is concerning I do not recommend a bone marrow biopsy at this time.     2. 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 Hollie Salk  - if there is significant proteinuria and unexplainable worsening of renal function (not the case at this time) - there might to consideration for renal biopsy to r/o AL AMyloidosis. --will defer this decision to nephrology.   3. Vitamin B12 deficient  Antibody testing done - no evidence of pernicious anemia. -He has been actively losing weight by eating healthy with no diet restrictions.   -I discussed the possible cause of this deficiency such as long term use of Nexium and Metformin. He is no longer on Metformin.  -continue B12 replacement per PCP  4.  Past Medical History:  Diagnosis Date  . Arthritis    back  . Cancer [facility])    prostate, sees Dr. Raynelle Bring   . Diabetes mellitus   . Erectile dysfunction   . GERD (gastroesophageal reflux disease)   . Hyperlipidemia   . Hypertension   . Migraines   . Peptic ulcer     Labs today Bone/skeletal survey in 1 week RTC with Dr Irene Limbo in 2 weeks    All of the patients questions were answered with apparent satisfaction. The patient knows to call the clinic with any problems, questions or concerns.  I spent 35 minutes counseling the patient face to face. The total time spent in the appointment was 45 minutes and more than 50% was on counseling and direct patient cares.    Sullivan Lone MD MS AAHIVMS [facility] Hematology/Oncology Physician [facility]  (Office):       [phone] (Work cell):  [phone] (Fax):           [phone]  05/04/2018 11:45 AM  I, Joslyn Devon, am acting as scribe for Sullivan Lone, MD.    .I have reviewed the above documentation for accuracy and completeness, and I agree with the above. [name] MD

## 2018-05-04 ENCOUNTER — Telehealth: Payer: Self-pay | Admitting: Hematology

## 2018-05-04 ENCOUNTER — Other Ambulatory Visit: Payer: PPO

## 2018-05-04 ENCOUNTER — Inpatient Hospital Stay: Payer: PPO | Attending: Hematology | Admitting: Hematology

## 2018-05-04 ENCOUNTER — Inpatient Hospital Stay: Payer: PPO

## 2018-05-04 VITALS — BP 110/67 | HR 80 | Temp 98.3°F | Resp 18 | Ht 71.0 in | Wt 223.5 lb

## 2018-05-04 DIAGNOSIS — D472 Monoclonal gammopathy: Secondary | ICD-10-CM | POA: Diagnosis not present

## 2018-05-04 DIAGNOSIS — E538 Deficiency of other specified B group vitamins: Secondary | ICD-10-CM | POA: Insufficient documentation

## 2018-05-04 DIAGNOSIS — R944 Abnormal results of kidney function studies: Secondary | ICD-10-CM

## 2018-05-04 DIAGNOSIS — N183 Chronic kidney disease, stage 3 (moderate): Secondary | ICD-10-CM

## 2018-05-04 DIAGNOSIS — I129 Hypertensive chronic kidney disease with stage 1 through stage 4 chronic kidney disease, or unspecified chronic kidney disease: Secondary | ICD-10-CM | POA: Insufficient documentation

## 2018-05-04 DIAGNOSIS — E1122 Type 2 diabetes mellitus with diabetic chronic kidney disease: Secondary | ICD-10-CM | POA: Insufficient documentation

## 2018-05-04 DIAGNOSIS — K219 Gastro-esophageal reflux disease without esophagitis: Secondary | ICD-10-CM

## 2018-05-04 LAB — CMP (CANCER CENTER ONLY)
ALBUMIN: 4.3 g/dL (ref 3.5–5.0)
ALT: 32 U/L (ref 0–55)
AST: 17 U/L (ref 5–34)
Alkaline Phosphatase: 89 U/L (ref 40–150)
Anion gap: 9 (ref 3–11)
BUN: 35 mg/dL — AB (ref 7–26)
CHLORIDE: 104 mmol/L (ref 98–109)
CO2: 24 mmol/L (ref 22–29)
Calcium: 9.4 mg/dL (ref 8.4–10.4)
Creatinine: 1.61 mg/dL — ABNORMAL HIGH (ref 0.70–1.30)
GFR, EST NON AFRICAN AMERICAN: 40 mL/min — AB (ref >60)
GFR, Est AFR Am: 47 mL/min — ABNORMAL LOW (ref >60)
Glucose, Bld: 69 mg/dL — ABNORMAL LOW (ref 70–140)
POTASSIUM: 4.5 mmol/L (ref 3.5–5.1)
Sodium: 137 mmol/L (ref 136–145)
Total Bilirubin: 0.4 mg/dL (ref 0.2–1.2)
Total Protein: 7.8 g/dL (ref 6.4–8.3)

## 2018-05-04 LAB — CBC WITH DIFFERENTIAL/PLATELET
BASOS ABS: 0 10*3/uL (ref 0.0–0.1)
Basophils Relative: 1 %
EOS PCT: 4 %
Eosinophils Absolute: 0.3 10*3/uL (ref 0.0–0.5)
HEMATOCRIT: 41.3 % (ref 38.4–49.9)
HEMOGLOBIN: 14.2 g/dL (ref 13.0–17.1)
LYMPHS ABS: 2.5 10*3/uL (ref 0.9–3.3)
Lymphocytes Relative: 41 %
MCH: 32.4 pg (ref 27.2–33.4)
MCHC: 34.4 g/dL (ref 32.0–36.0)
MCV: 94.3 fL (ref 79.3–98.0)
Monocytes Absolute: 0.5 10*3/uL (ref 0.1–0.9)
Monocytes Relative: 9 %
NEUTROS PCT: 45 %
Neutro Abs: 2.9 10*3/uL (ref 1.5–6.5)
Platelets: 216 10*3/uL (ref 140–400)
RBC: 4.38 MIL/uL (ref 4.20–5.82)
RDW: 13.4 % (ref 11.0–14.6)
WBC: 6.2 10*3/uL (ref 4.0–10.3)

## 2018-05-04 LAB — SEDIMENTATION RATE: SED RATE: 20 mm/h — AB (ref 0–16)

## 2018-05-04 LAB — IRON AND TIBC
IRON: 75 ug/dL (ref 42–163)
Saturation Ratios: 29 % — ABNORMAL LOW (ref 42–163)
TIBC: 255 ug/dL (ref 202–409)
UIBC: 180 ug/dL

## 2018-05-04 LAB — LACTATE DEHYDROGENASE: LDH: 143 U/L (ref 125–245)

## 2018-05-04 LAB — FERRITIN: Ferritin: 167 ng/mL (ref 22–316)

## 2018-05-04 NOTE — Telephone Encounter (Signed)
Appointments scheduled AVS/Calendar printed per 6/5 los °

## 2018-05-05 LAB — KAPPA/LAMBDA LIGHT CHAINS
Kappa free light chain: 15.5 mg/L (ref 3.3–19.4)
Kappa, lambda light chain ratio: 0.42 (ref 0.26–1.65)
Lambda free light chains: 36.6 mg/L — ABNORMAL HIGH (ref 5.7–26.3)

## 2018-05-05 LAB — INTRINSIC FACTOR ANTIBODIES: Intrinsic Factor: 1 [AU]/ml (ref 0.0–1.1)

## 2018-05-05 LAB — ANTI-PARIETAL ANTIBODY: Parietal Cell Antibody-IgG: 1.9 U (ref 0.0–20.0)

## 2018-05-08 LAB — MULTIPLE MYELOMA PANEL, SERUM
Albumin SerPl Elph-Mcnc: 3.8 g/dL (ref 2.9–4.4)
Albumin/Glob SerPl: 1.2 (ref 0.7–1.7)
Alpha 1: 0.3 g/dL (ref 0.0–0.4)
Alpha2 Glob SerPl Elph-Mcnc: 0.9 g/dL (ref 0.4–1.0)
B-GLOBULIN SERPL ELPH-MCNC: 1.1 g/dL (ref 0.7–1.3)
GAMMA GLOB SERPL ELPH-MCNC: 1.1 g/dL (ref 0.4–1.8)
GLOBULIN, TOTAL: 3.3 g/dL (ref 2.2–3.9)
IgA: 100 mg/dL (ref 61–437)
IgG (Immunoglobin G), Serum: 1079 mg/dL (ref 700–1600)
IgM (Immunoglobulin M), Srm: 48 mg/dL (ref 15–143)
M PROTEIN SERPL ELPH-MCNC: 0.8 g/dL — AB
Total Protein ELP: 7.1 g/dL (ref 6.0–8.5)

## 2018-05-09 DIAGNOSIS — D472 Monoclonal gammopathy: Secondary | ICD-10-CM | POA: Diagnosis not present

## 2018-05-10 ENCOUNTER — Ambulatory Visit (HOSPITAL_COMMUNITY)
Admission: RE | Admit: 2018-05-10 | Discharge: 2018-05-10 | Disposition: A | Discharge status: Home / Self Care | Payer: PPO | Source: Ambulatory Visit | Attending: Hematology | Admitting: Hematology

## 2018-05-10 DIAGNOSIS — D472 Monoclonal gammopathy: Secondary | ICD-10-CM | POA: Insufficient documentation

## 2018-05-10 LAB — UPEP/UIFE/LIGHT CHAINS/TP, 24-HR UR
% BETA, URINE: 26.6 %
ALPHA 1 URINE: 3.9 %
ALPHA 2 UR: 12.7 %
Albumin, U: 24.2 %
FREE KAPPA LT CHAINS, UR: 82.6 mg/L — AB (ref 1.35–24.19)
FREE KAPPA/LAMBDA RATIO: 4.92 (ref 2.04–10.37)
FREE LAMBDA LT CHAINS, UR: 16.8 mg/L — AB (ref 0.24–6.66)
GAMMA GLOBULIN URINE: 32.6 %
Total Protein, Urine-Ur/day: 148 mg/24 hr (ref 30–150)
Total Protein, Urine: 7.6 mg/dL
Total Volume: 1950

## 2018-05-13 DIAGNOSIS — H5202 Hypermetropia, left eye: Secondary | ICD-10-CM | POA: Diagnosis not present

## 2018-05-13 DIAGNOSIS — H25813 Combined forms of age-related cataract, bilateral: Secondary | ICD-10-CM | POA: Diagnosis not present

## 2018-05-13 DIAGNOSIS — H52223 Regular astigmatism, bilateral: Secondary | ICD-10-CM | POA: Diagnosis not present

## 2018-05-13 DIAGNOSIS — H11153 Pinguecula, bilateral: Secondary | ICD-10-CM | POA: Diagnosis not present

## 2018-05-13 DIAGNOSIS — H35363 Drusen (degenerative) of macula, bilateral: Secondary | ICD-10-CM | POA: Diagnosis not present

## 2018-05-13 DIAGNOSIS — H40011 Open angle with borderline findings, low risk, right eye: Secondary | ICD-10-CM | POA: Diagnosis not present

## 2018-05-13 DIAGNOSIS — Z7984 Long term (current) use of oral hypoglycemic drugs: Secondary | ICD-10-CM | POA: Diagnosis not present

## 2018-05-13 DIAGNOSIS — H5211 Myopia, right eye: Secondary | ICD-10-CM | POA: Diagnosis not present

## 2018-05-13 DIAGNOSIS — E119 Type 2 diabetes mellitus without complications: Secondary | ICD-10-CM | POA: Diagnosis not present

## 2018-05-13 DIAGNOSIS — H40051 Ocular hypertension, right eye: Secondary | ICD-10-CM | POA: Diagnosis not present

## 2018-05-17 ENCOUNTER — Encounter: Payer: Self-pay | Admitting: Family Medicine

## 2018-05-17 LAB — HM DIABETES EYE EXAM

## 2018-05-17 NOTE — Progress Notes (Signed)
HEMATOLOGY/ONCOLOGY CONSULTATION NOTE  Date of Service: 05/18/2018  Patient Care Team: Laurey Morale, MD as PCP - General (Family Medicine)  CHIEF COMPLAINTS/PURPOSE OF CONSULTATION:  Positive M-Spike   HISTORY OF PRESENTING ILLNESS:   Troy Howard 77 y.o. male is a here because of positive M-spike. The patient was referred by nephrologist Dr. Hollie Salk. The patient presents to the clinic today accompanied by his wife.  He is being seen by nephrologist who pursued further work up to why his Creatinine has increased. Upon workup his M-protein was found to be 0.8g/dl.  He has had HTN and DM for many years that can affects his Kidney function and was thought to be the likely cause of his CKD.   Today his wife notes he has had muscle pain, muscle mass loss and fatigue.   He has HTN, DM, GERD, Hypothyroid, Hyperlipidemia. He was recently taken off statins, meloxicam, ASA and metformin. He takes Nexium for her GERD and has been on for many years.   On review of symptoms, pt notes purposeful weight loss, muscle loss and muscle pain. He also has leg pain from b/l knees to anterior shin.  Interval History:   Troy Howard returns today regarding his M-spike. The patient's last visit with [place] was on 05/04/18. He is accompanied today by wife and daughter. The pt reports that he is doing well overall.   The pt reports that he has no new concerns.   Of note since the patient's last visit, pt has had DG Bone Survey Met completed on 05/10/18 with results revealing No lytic lesions on skeletal survey to suggest multiple myeloma.  Lab results (05/04/18) of CBC, CMP is as follows: all values are WNL except for Glucose at 69, BUN at 35, Creatinine at 1.61. MMP 05/04/18 showed M Protein at 0.8 UPEP 24 hr ur 05/09/18 revealed Free kappa elevated at 82.60, Free Lambda:Kappa ratio is elevated at 16.80.  Kappa/lambda light chains 05/04/18 showed Lambda elevated at 36.6 Iron/TIBC 05/04/18 shows a 29%  saturation ratio Ferritin 05/04/18 is WNL at 167 LDH 05/09/18 was WNL at 143 Sed Rate 05/09/18 was elevated at 20  On review of systems, pt reports some stable fatigue and denies any new or concerning symptoms.    MEDICAL HISTORY:  Past Medical History:  Diagnosis Date  . Arthritis    back  . Cancer [facility])    prostate, sees Dr. Raynelle Bring   . Diabetes mellitus   . Erectile dysfunction   . GERD (gastroesophageal reflux disease)   . Hyperlipidemia   . Hypertension   . Migraines   . Peptic ulcer     SURGICAL HISTORY: Past Surgical History:  Procedure Laterality Date  . APPENDECTOMY  1950  . COLONOSCOPY  11/14/2015   per Dr. Fuller Plan, adenomatous polyps, repeat in 5 yrs   . HERNIA REPAIR     umbilical   . MENISCUS REPAIR Left   . PROSTATE SURGERY  2009   radical prostatectomy  . SPINE SURGERY     L4-L5 fusion per Dr. Glenna Fellows   . TONSILLECTOMY AND ADENOIDECTOMY  1954    SOCIAL HISTORY: Social History   Socioeconomic History  . Marital status: Married    Spouse name: Not on file  . Number of children: Not on file  . Years of education: Not on file  . Highest education level: Not on file  Occupational History  . Not on file  Social Needs  . Financial resource strain: Not  on file  . Food insecurity:    Worry: Not on file    Inability: Not on file  . Transportation needs:    Medical: Not on file    Non-medical: Not on file  Tobacco Use  . Smoking status: Never Smoker  . Smokeless tobacco: Never Used  Substance and Sexual Activity  . Alcohol use: Yes    Alcohol/week: 0.0 oz    Comment: rare  . Drug use: No  . Sexual activity: Not on file  Lifestyle  . Physical activity:    Days per week: Not on file    Minutes per session: Not on file  . Stress: Not on file  Relationships  . Social connections:    Talks on phone: Not on file    Gets together: Not on file    Attends religious service: Not on file    Active member of club or organization: Not on file     Attends meetings of clubs or organizations: Not on file    Relationship status: Not on file  . Intimate partner violence:    Fear of current or ex partner: Not on file    Emotionally abused: Not on file    Physically abused: Not on file    Forced sexual activity: Not on file  Other Topics Concern  . Not on file  Social History Narrative  . Not on file    FAMILY HISTORY: Family History  Problem Relation Age of Onset  . Hypertension Unknown   . Cancer Unknown   . Colon cancer Neg Hx     ALLERGIES:  has No Known Allergies.  MEDICATIONS:  Current Outpatient Medications  Medication Sig Dispense Refill  . aspirin 81 MG tablet Take 81 mg by mouth daily.    Marland Kitchen atorvastatin (LIPITOR) 20 MG tablet TAKE 1 TABLET BY MOUTH EVERY DAY 90 tablet 3  . butalbital-acetaminophen-caffeine (FIORICET, ESGIC) 50-325-40 MG tablet Take 2 tablets by mouth every 4 (four) hours as needed for headache. 100 tablet 5  . esomeprazole (NEXIUM) 20 MG capsule Take 1 capsule (20 mg total) by mouth daily at 12 noon. 90 capsule 3  . fenofibrate 160 MG tablet Take 1 tablet (160 mg total) by mouth daily. 90 tablet 3  . glipiZIDE (GLUCOTROL) 10 MG tablet TAKE 1 TABLET BY MOUTH TWICE A DAY BEFORE A MEAL 180 tablet 3  . levothyroxine (SYNTHROID, LEVOTHROID) 125 MCG tablet TAKE 1 TABLET (125 MCG TOTAL) BY MOUTH DAILY. 90 tablet 1  . losartan (COZAAR) 100 MG tablet TAKE 1 TABLET BY MOUTH EVERY DAY (Patient not taking: Reported on 05/18/2018) 90 tablet 2  . meloxicam (MOBIC) 15 MG tablet TAKE 1 TABLET BY MOUTH EVERY DAY (Patient not taking: Reported on 05/18/2018) 90 tablet 3  . metFORMIN (GLUCOPHAGE) 500 MG tablet Take 1 tablet (500 mg total) by mouth 2 (two) times daily with a meal. (Patient not taking: Reported on 05/18/2018) 180 tablet 3   No current facility-administered medications for this visit.     REVIEW OF SYSTEMS:   A 10+ POINT REVIEW OF SYSTEMS WAS OBTAINED including neurology, dermatology, psychiatry, cardiac,  respiratory, lymph, extremities, GI, GU, Musculoskeletal, constitutional, breasts, reproductive, HEENT.  All pertinent positives are noted in the HPI.  All others are negative.   PHYSICAL EXAMINATION: ECOG PERFORMANCE STATUS: 1 - Symptomatic but completely ambulatory  . Vitals:   05/18/18 1459  BP: (!) 131/59  Pulse: 75  Resp: 17  Temp: 98 F (36.7 C)  SpO2: 100%  Filed Weights   05/18/18 1459  Weight: 224 lb 4.8 oz (101.7 kg)   .Body mass index is 31.28 kg/m.  GENERAL:alert, in no acute distress and comfortable SKIN: no acute rashes, no significant lesions EYES: conjunctiva are pink and non-injected, sclera anicteric OROPHARYNX: MMM, no exudates, no oropharyngeal erythema or ulceration NECK: supple, no JVD LYMPH:  no palpable lymphadenopathy in the cervical, axillary or inguinal regions LUNGS: clear to auscultation b/l with normal respiratory effort HEART: regular rate & rhythm ABDOMEN:  normoactive bowel sounds , non tender, not distended. Extremity: no pedal edema PSYCH: alert & oriented x 3 with fluent speech NEURO: no focal motor/sensory deficits   LABORATORY DATA:  I have reviewed the data as listed  . CBC Latest Ref Rng & Units 05/04/2018 02/22/2018 10/15/2017  WBC 4.0 - 10.3 K/uL 6.2 6.7 6.8  Hemoglobin 13.0 - 17.1 g/dL 14.2 12.7(L) 14.6  Hematocrit 38.4 - 49.9 % 41.3 37.1(L) 43.1  Platelets 140 - 400 K/uL 216 263.0 197.0   . CBC    Component Value Date/Time   WBC 6.2 05/04/2018 1240   RBC 4.38 05/04/2018 1240   HGB 14.2 05/04/2018 1240   HCT 41.3 05/04/2018 1240   PLT 216 05/04/2018 1240   MCV 94.3 05/04/2018 1240   MCH 32.4 05/04/2018 1240   MCHC 34.4 05/04/2018 1240   RDW 13.4 05/04/2018 1240   LYMPHSABS 2.5 05/04/2018 1240   MONOABS 0.5 05/04/2018 1240   EOSABS 0.3 05/04/2018 1240   BASOSABS 0.0 05/04/2018 1240    . CMP Latest Ref Rng & Units 05/04/2018 02/22/2018 10/15/2017  Glucose 70 - 140 mg/dL 69(L) 253(H) 123(H)  BUN 7 - 26 mg/dL 35(H)  34(H) 28(H)  Creatinine 0.70 - 1.30 mg/dL 1.61(H) 2.00(H) 1.42  Sodium 136 - 145 mmol/L 137 135 137  Potassium 3.5 - 5.1 mmol/L 4.5 4.0 4.5  Chloride 98 - 109 mmol/L 104 100 102  CO2 22 - 29 mmol/L _0 Calcium 8.4 - 10.4 mg/dL 9.4 9.2 9.0  Total Protein 6.4 - 8.3 g/dL 7.8 6.8 6.8  Total Bilirubin 0.2 - 1.2 mg/dL 0.4 0.3 0.8  Alkaline Phos 40 - 150 U/L 89 49 89  AST 5 - 34 U/L _1 ALT 0 - 55 U/L 32 25 20   Component     Latest Ref Rng & Units 05/04/2018  Iron     42 - 163 ug/dL 75  TIBC     202 - 409 ug/dL 255  Saturation Ratios     42 - 163 % 29 (L)  UIBC     ug/dL 180  LDH     125 - 245 U/L 143  Sed Rate     0 - 16 mm/hr 20 (H)  Ferritin     22 - 316 ng/mL 167  Intrinsic Factor     0.0 - 1.1 AU/mL 1.0  Parietal Cell Antibody-IgG     0.0 - 20.0 Units 1.9   Component     Latest Ref Rng & Units 05/09/2018  Total Protein, Urine-UPE24     Not Estab. mg/dL 7.6  Total Protein, Urine-Ur/day     30 - 150 mg/24 hr 148  ALBUMIN, U     % 24.2  ALPHA 1 URINE     % 3.9  Alpha 2, Urine     % 12.7  % BETA, Urine     % 26.6  GAMMA GLOBULIN URINE     % 32.6  Free Kappa  Lt Chains,Ur     1.35 - 24.19 mg/L 82.60 (H)  Free Lambda Lt Chains,Ur     0.24 - 6.66 mg/L 16.80 (H)  Free Kappa/Lambda Ratio     2.04 - 10.37 4.92  Immunofixation Result, Urine      Comment  Total Volume      1,950  M-SPIKE %, Urine     Not Observed % Not Observed  NOTE:      Comment      OUTSIDE LABS 04/21/18       RADIOGRAPHIC STUDIES: I have personally reviewed the radiological images as listed and agreed with the findings in the report. Dg Bone Survey Met  Result Date: 05/10/2018 CLINICAL DATA:  Monoclonal paraproteinemia EXAM: METASTATIC BONE SURVEY COMPARISON:  None FINDINGS: No lytic lesion in the calvarium. No lytic lesion in the cervical spine. No lytic lesions in the ribs or scapula on chest view. No lytic lesion in the LEFT or RIGHT proximal humerus. No lytic lesion is  distal humeri. No lytic lesions the forearms. No lytic lesions in the thoracic spine. No lytic lesions in the lumbar spine. Posterior lumbar fusion noted. No lytic lesions pelvis or proximal femurs. Lytic lesions in the mid and distal femurs. No lytic lesions in the tibia fibula. IMPRESSION: No lytic lesions on skeletal survey to suggest multiple myeloma. Electronically Signed   By: Suzy Bouchard M.D.   On: 05/10/2018 17:15    ASSESSMENT & PLAN:   TAWFIQ FAVILA is a 77 y.o. caucasian male with    1. IgG Lambda monoclonal paraproteinemia  He has a M-spike of 0.8g/dl and normal SFLC and ratio. No anemia - nl Hgb No focal bone pains --- Skeletal survey - shows no  No hypercalcemia. 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  PLAN   -I discussed the abnormal labs, and the potential meaning and etiologies of increase in M-spike and criteria for evaluation of plasma cell dyscrasias with patient and his wife.  -available data - not suggestive of myeloma at this time. -I discussed his HTN and DM and past NSAID use are the more obvious causes to his elevation of Creatine and CKD (renal function better on labs today).  -His US Renal from 04/14/18 showed no renal abnormality/urinary obstruction along with 1 simple cyst of left kidney.  -Discussed pt labwork from 05/04/18; creatinine decreased to 1.61 (from 2 previously) -Discussed 05/10/18 Metastatic Bone Survey which revealed no lytic lesions on skeletal survey to suggest multiple myeloma. -No high calcium present, no anemia, some explainable renal insufficiency, and no bone lesions -No indication for BM examination at this time -Continue sublingual Vitamin B12 replacement as managed by PCP -Continue optimizing management and care of diabetes and hypertension -Monoclonal gammopathy of  undetermined significance -Will repeat labs in 6 months unless CRAB criteria develop or worsen  2. 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 Hollie Salk  -mild non nephrotic proteinuria on 24h UPEP-Continue follow up with Dr Hollie Salk for CKD    3. Vitamin B12 deficient  Antibody testing done - no evidence of pernicious anemia. No evidence of pernicious anemia based on neg IF and parietal cell ab -He has been actively losing weight by eating healthy with no diet restrictions.  -I discussed the possible cause of this deficiency such as  long term use of Nexium and Metformin. He is no longer on Metformin.  -continue B12 replacement per PCP  4.  Past Medical History:  Diagnosis Date  . Arthritis    back  . Cancer [facility])    prostate, sees Dr. Raynelle Bring   . Diabetes mellitus   . Erectile dysfunction   . GERD (gastroesophageal reflux disease)   . Hyperlipidemia   . Hypertension   . Migraines   . Peptic ulcer     RTC with Dr Irene Limbo in 6 months Labs 1 week prior to clinic visit   All of the patients questions were answered with apparent satisfaction. The patient knows to call the clinic with any problems, questions or concerns.  . The total time spent in the appointment was 25 minutes and more than 50% was on counseling and direct patient cares.      Troy Lone MD MS AAHIVMS [facility] Hematology/Oncology Physician [facility]  (Office):       [phone] (Work cell):  [phone] (Fax):           [phone]  05/18/2018 3:29 PM  I, Baldwin [place], am acting as a Education administrator for Dr Irene Limbo.   .I have reviewed the above documentation for accuracy and completeness, and I agree with the above. [name] MD

## 2018-05-18 ENCOUNTER — Telehealth: Payer: Self-pay | Admitting: Hematology

## 2018-05-18 ENCOUNTER — Inpatient Hospital Stay (HOSPITAL_BASED_OUTPATIENT_CLINIC_OR_DEPARTMENT_OTHER): Payer: PPO | Admitting: Hematology

## 2018-05-18 ENCOUNTER — Encounter: Payer: Self-pay | Admitting: Hematology

## 2018-05-18 VITALS — BP 131/59 | HR 75 | Temp 98.0°F | Resp 17 | Ht 71.0 in | Wt 224.3 lb

## 2018-05-18 DIAGNOSIS — K219 Gastro-esophageal reflux disease without esophagitis: Secondary | ICD-10-CM

## 2018-05-18 DIAGNOSIS — R944 Abnormal results of kidney function studies: Secondary | ICD-10-CM | POA: Diagnosis not present

## 2018-05-18 DIAGNOSIS — D472 Monoclonal gammopathy: Secondary | ICD-10-CM

## 2018-05-18 DIAGNOSIS — E538 Deficiency of other specified B group vitamins: Secondary | ICD-10-CM

## 2018-05-18 DIAGNOSIS — E1122 Type 2 diabetes mellitus with diabetic chronic kidney disease: Secondary | ICD-10-CM

## 2018-05-18 DIAGNOSIS — N183 Chronic kidney disease, stage 3 (moderate): Secondary | ICD-10-CM | POA: Diagnosis not present

## 2018-05-18 DIAGNOSIS — I129 Hypertensive chronic kidney disease with stage 1 through stage 4 chronic kidney disease, or unspecified chronic kidney disease: Secondary | ICD-10-CM | POA: Diagnosis not present

## 2018-05-18 NOTE — Telephone Encounter (Signed)
Appointments scheduled Letter/ Calendar mailed / message on voicemail for patient also per 6/19 los

## 2018-06-16 ENCOUNTER — Other Ambulatory Visit: Payer: Self-pay | Admitting: Family Medicine

## 2018-06-16 ENCOUNTER — Telehealth: Payer: Self-pay

## 2018-06-16 DIAGNOSIS — E119 Type 2 diabetes mellitus without complications: Secondary | ICD-10-CM

## 2018-06-16 NOTE — Telephone Encounter (Signed)
Copied from [place] [id number]. Topic: Referral - Request >> Jun 16, 2018  3:57 PM Troy Howard, NT wrote: Reason for CRM: patient called and states that he is a diabetic and would like a referral to a dietician.  Please call when the referral is placed CB # [phone]

## 2018-06-16 NOTE — Telephone Encounter (Signed)
Please advise 

## 2018-06-17 NOTE — Telephone Encounter (Signed)
Sent to PCP to place referral  

## 2018-06-17 NOTE — Telephone Encounter (Signed)
Called pt and left a detailed voice mail that Dr. Sarajane Jews did place the referral as requested.

## 2018-06-17 NOTE — Addendum Note (Signed)
Addended by: [name] A on: 06/17/2018 09:59 AM   Modules accepted: Orders

## 2018-06-17 NOTE — Telephone Encounter (Signed)
The referral was done  

## 2018-07-12 ENCOUNTER — Encounter: Payer: Self-pay | Admitting: Registered"

## 2018-07-12 ENCOUNTER — Encounter: Payer: PPO | Attending: Family Medicine | Admitting: Registered"

## 2018-07-12 DIAGNOSIS — E119 Type 2 diabetes mellitus without complications: Secondary | ICD-10-CM | POA: Diagnosis not present

## 2018-07-12 DIAGNOSIS — Z713 Dietary counseling and surveillance: Secondary | ICD-10-CM | POA: Insufficient documentation

## 2018-07-12 NOTE — Progress Notes (Signed)
Diabetes Self-Management Education  Visit Type: First/Initial  Appt. Start Time: 0800 Appt. End Time: 0930  07/12/2018  [name], identified by name and date of birth, is a 77 y.o. male with a diagnosis of Diabetes: Type 2.   This patient is accompanied in the office by his spouse.  ASSESSMENT Pt states his doctor took him off metformin and about the same time he stopped eating all fast food, cookies, candy, hotdogs. Pt states his philosophy is that if it tastes good he has to spit it out.    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 Pt states he is fine with walking on a flat surface, slopes hurt his knees. Pt states he falls asleep watching TV around 9 pm and gets up 5 am every day. Gets up 3-4x night to use the bathroom.  Diabetes Self-Management Education - 07/12/18 0817      Visit Information   Visit Type  First/Initial      Initial Visit   Diabetes Type  Type 2    Are you currently following a meal plan?  No    Are you taking your medications as prescribed?  Yes    Date Diagnosed  10 yrs ago      Health Coping   How would you rate your overall health?  Good      Psychosocial Assessment   Patient Belief/Attitude about Diabetes  Motivated to manage diabetes    How often do you need to have someone help you when you read instructions, pamphlets, or other written materials from your doctor or pharmacy?  1 - Never    What is the last grade level you completed in school?  2 yrs college      Complications   Last HgB A1C per patient/outside source  6.8 %    How often do you check your blood sugar?  1-2 times/day    Fasting Blood glucose range (mg/dL)  130-179   130-160   Postprandial Blood glucose range (mg/dL)  130-179   150-180   Number of hypoglycemic episodes per month  2    Can you tell when your blood sugar is low?  Yes    What do you do if your blood sugar is low?  rule of 15    Number of hyperglycemic  episodes per week  0    Have you had a dilated eye exam in the past 12 months?  No    Have you had a dental exam in the past 12 months?  Yes    Are you checking your feet?  Yes      Dietary Intake   Breakfast  oatmeal low sugar, coffee, 1 cream 1 sugar, vinegar with honey    Snack (morning)  boost glucose control OR sm banana, cheese    Lunch  grilled chicken 1/2 bread, grapes, celery OR ham & cheese sandwich, raw veggies    Snack (afternoon)  popcorn    Dinner  sm chli, broccoli, cauliflower, carrots, 2 small sugar-free cookies    Snack (evening)  none    Beverage(s)  coffee morning, Ice water, water,       Exercise   Exercise Type  Light (walking / raking leaves)    How many days per week to you exercise?  2    How many minutes per day do you exercise?  30    Total minutes per week  of exercise  60      Patient Education   Nutrition management   Role of diet in the treatment of diabetes and the relationship between the three main macronutrients and blood glucose level    Physical activity and exercise   Role of exercise on diabetes management, blood pressure control and cardiac health.    Medications  Reviewed patients medication for diabetes, action, purpose, timing of dose and side effects.    Monitoring  Purpose and frequency of SMBG.    Psychosocial adjustment  Role of stress on diabetes      Individualized Goals (developed by patient)   Nutrition  General guidelines for healthy choices and portions discussed    Physical Activity  Exercise 3-5 times per week    Medications  take my medication as prescribed    Monitoring   test my blood glucose as discussed      Outcomes   Expected Outcomes  Demonstrated interest in learning. Expect positive outcomes    Future DMSE  4-6 wks    Program Status  Not Completed     Individualized Plan for Diabetes Self-Management Training:   Learning Objective:  Patient will have a greater understanding of diabetes self-management. Patient  education plan is to attend individual and/or group sessions per assessed needs and concerns.  Patient Instructions  To help bring down the morning fasting blood sugar: Regular exercise, 3-5 times week for 30-45 minutes. Be aware of potential low blood sugar. Changing the timing of the glipizide to the evening may help Consider taking inositol to help increase insulin sensitivity. High dose inositol product website, ask your doctor before taking: https://theralogix.com/products/ovasitol-inositol-powder  For Triglycerides, increasing exercise may help also consider eating more fish high in omega 3 [employer] brand of omega 3 should help, 1,000 mg. Check with doctor before taking than 1,000, but research shows 2,000 may be optimal dose to bring down TG.  Continue eating balanced meals and snacks. Consider drinking your vinegar water without honey   Expected Outcomes:  Demonstrated interest in learning. Expect positive outcomes  Education material provided: ADA Diabetes: Your Take Control Guide and A1C conversion sheet, Understanding morning high blood sugar  If problems or questions, patient to contact team via:  Phone and MyChart  Future DSME appointment: 4-6 wks

## 2018-07-12 NOTE — Patient Instructions (Addendum)
To help bring down the morning fasting blood sugar: Regular exercise, 3-5 times week for 30-45 minutes. Be aware of potential low blood sugar. Changing the timing of the glipizide to the evening may help Consider taking inositol to help increase insulin sensitivity. High dose inositol product website, ask your doctor before taking: https://theralogix.com/products/ovasitol-inositol-powder  For Triglycerides, increasing exercise may help also consider eating more fish high in omega 3 [place] brand of omega 3 should help, 1,000 mg. Check with doctor before taking more than 1,000, but research shows 2,000 may be optimal dose to bring down TG.  Continue eating balanced meals and snacks. Consider drinking your vinegar water without honey

## 2018-07-15 ENCOUNTER — Other Ambulatory Visit: Payer: Self-pay | Admitting: Family Medicine

## 2018-07-15 DIAGNOSIS — Z0000 Encounter for general adult medical examination without abnormal findings: Secondary | ICD-10-CM

## 2018-08-25 DIAGNOSIS — L821 Other seborrheic keratosis: Secondary | ICD-10-CM | POA: Diagnosis not present

## 2018-08-25 DIAGNOSIS — Z85820 Personal history of malignant melanoma of skin: Secondary | ICD-10-CM | POA: Diagnosis not present

## 2018-08-25 DIAGNOSIS — L570 Actinic keratosis: Secondary | ICD-10-CM | POA: Diagnosis not present

## 2018-08-25 DIAGNOSIS — D485 Neoplasm of uncertain behavior of skin: Secondary | ICD-10-CM | POA: Diagnosis not present

## 2018-08-30 ENCOUNTER — Encounter: Payer: PPO | Attending: Family Medicine | Admitting: Registered"

## 2018-08-30 DIAGNOSIS — E119 Type 2 diabetes mellitus without complications: Secondary | ICD-10-CM | POA: Diagnosis not present

## 2018-08-30 DIAGNOSIS — Z713 Dietary counseling and surveillance: Secondary | ICD-10-CM | POA: Insufficient documentation

## 2018-08-30 NOTE — Patient Instructions (Addendum)
Look into for app to sync with your glucose meter Continue to have balanced meals and snacks Consider looking into inositol (Ovasitol has been researched, shown to be similar effect as Metformin) Consider having a balanced snack before bed When you have a high morning fasting number consider 3 things and make notes in your log: 1) what did you have for dinner? Was there a lot of fat, was it too low or high in carbs 2) Did you have a balanced snack before bed? 3) How is your stress level

## 2018-08-30 NOTE — Progress Notes (Signed)
Diabetes Self-Management Education  Visit Type: Follow-up  Appt. Start Time: 0800 Appt. End Time: 0840  08/30/2018  [name], identified by name and date of birth, is a 77 y.o. male with a diagnosis of Diabetes: Type 2.   ASSESSMENT Pt states his next appt with GP is in Nov and expects to have an updated A1c. Pt states his kidney doctor has taken off several medications including statin, metformin, fenofibrate, meloxicam. Pt states his FBG is not has well controlled since discontinuing metformin.  Pt states he is discouraged he hasn't noticed more dramatic health changes since he cut out all fast food, soda, fries. RD reminded him he has been taken off several medications and maintaining good control over much of his labs that were out of range.   Pt states he was golfing on Sunday, was very hot, but he stayed hydrated and had snacks, no recent hypoglycemic episodes on golf course.  Diabetes Self-Management Education - 08/30/18 0800      Visit Information   Visit Type  Follow-up      Initial Visit   Diabetes Type  Type 2      Complications   How often do you check your blood sugar?  1-2 times/day    Fasting Blood glucose range (mg/dL)  70-129;130-179    Postprandial Blood glucose range (mg/dL)  70-129;130-179    Number of hypoglycemic episodes per month  1    Can you tell when your blood sugar is low?  Yes      Dietary Intake   Breakfast  oatmeal, coffee, OR egg muffin    Snack (morning)  protein drink    Lunch  1/2 ham sandwich, fruit,     Snack (afternoon)  atkins    [employer], green beans, very small amt of rice    Snack (evening)  1 sugar-free cookie small glass of [employer] milk 2%    Beverage(s)  coffee, water      Patient Education   Medications  Reviewed patients medication for diabetes, action, purpose, timing of dose and side effects.    Monitoring  Taught/discussed recording of test results and interpretation of SMBG.    Psychosocial  adjustment  Role of stress on diabetes      Individualized Goals (developed by patient)   Nutrition  Other (comment)   Bedtime snack for morning FBG   Monitoring   test my blood glucose as discussed      Outcomes   Expected Outcomes  Demonstrated interest in learning. Expect positive outcomes    Future DMSE  4-6 wks    Program Status  Completed      Subsequent Visit   Since your last visit have you continued or begun to take your medications as prescribed?  Yes    Since your last visit, are you checking your blood glucose at least once a day?  Yes      Individualized Plan for Diabetes Self-Management Training:   Learning Objective:  Patient will have a greater understanding of diabetes self-management. Patient education plan is to attend individual and/or group sessions per assessed needs and concerns.    Patient Instructions  Look into for app to sync with your glucose meter Continue to have balanced meals and snacks Consider looking into inositol (Ovasitol has been researched, shown to be similar effect as Metformin) Consider having a balanced snack before bed When you have a high morning fasting number consider 3 things and make notes in your log:  1) what did you have for dinner? Was there a lot of fat, was it too low or high in carbs 2) Did you have a balanced snack before bed? 3) How is your stress level   Expected Outcomes:  Demonstrated interest in learning. Expect positive outcomes  Education material provided: none  If problems or questions, patient to contact team via:  Phone  Future DSME appointment: 4-6 wks

## 2018-10-17 ENCOUNTER — Ambulatory Visit (INDEPENDENT_AMBULATORY_CARE_PROVIDER_SITE_OTHER): Payer: PPO | Admitting: Family Medicine

## 2018-10-17 ENCOUNTER — Encounter: Payer: Self-pay | Admitting: Family Medicine

## 2018-10-17 VITALS — BP 138/82 | HR 60 | Temp 97.8°F | Ht 70.0 in | Wt 230.0 lb

## 2018-10-17 DIAGNOSIS — E119 Type 2 diabetes mellitus without complications: Secondary | ICD-10-CM | POA: Diagnosis not present

## 2018-10-17 DIAGNOSIS — E039 Hypothyroidism, unspecified: Secondary | ICD-10-CM | POA: Diagnosis not present

## 2018-10-17 DIAGNOSIS — Z0000 Encounter for general adult medical examination without abnormal findings: Secondary | ICD-10-CM

## 2018-10-17 DIAGNOSIS — Z8546 Personal history of malignant neoplasm of prostate: Secondary | ICD-10-CM

## 2018-10-17 DIAGNOSIS — R972 Elevated prostate specific antigen [PSA]: Secondary | ICD-10-CM | POA: Diagnosis not present

## 2018-10-17 MED ORDER — SYNTHROID 137 MCG PO TABS: 137.0000 ug | ORAL_TABLET | Freq: Every day | ORAL | 3 refills | Status: DC

## 2018-10-17 MED ORDER — TAMSULOSIN HCL 0.4 MG PO CAPS: 0.4000 mg | ORAL_CAPSULE | Freq: Every day | ORAL | 3 refills | Status: DC

## 2018-10-17 MED ORDER — ESOMEPRAZOLE MAGNESIUM 20 MG PO CPDR: 20.0000 mg | DELAYED_RELEASE_CAPSULE | Freq: Every day | ORAL | 3 refills | Status: DC

## 2018-10-17 MED ORDER — GLIPIZIDE 10 MG PO TABS: 10.0000 mg | ORAL_TABLET | Freq: Two times a day (BID) | ORAL | 3 refills | Status: DC

## 2018-10-17 MED ORDER — BUTALBITAL-APAP-CAFFEINE 50-325-40 MG PO TABS: 2.0000 | ORAL_TABLET | ORAL | 5 refills | Status: DC | PRN

## 2018-10-17 NOTE — Progress Notes (Signed)
   Subjective:    Patient ID: Troy Howard, male    DOB: 1941/04/13, 77 y.o.   MRN: [id number]  HPI Here for a well exam. He feels well in general. He does mention getting up to urinate 4 times every night. He has no daytime issues with urinating. He wants to switch to name band Synthroid. 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 He sees Dr. Hollie Salk for chronic kidney disease.    Review of Systems  Constitutional: Negative.   HENT: Negative.   Eyes: Negative.   Respiratory: Negative.   Cardiovascular: Negative.   Gastrointestinal: Negative.   Genitourinary: Positive for frequency.  Musculoskeletal: Negative.   Skin: Negative.   Neurological: Negative.   Psychiatric/Behavioral: Negative.        Objective:   Physical Exam  Constitutional: He is oriented to person, place, and time. He appears well-developed and well-nourished. No distress.  HENT:  Head: Normocephalic and atraumatic.  Right Ear: External ear normal.  Left Ear: External ear normal.  Nose: Nose normal.  Mouth/Throat: Oropharynx is clear and moist. No oropharyngeal exudate.  Eyes: Pupils are equal, round, and reactive to light. Conjunctivae and EOM are normal. Right eye exhibits no discharge. Left eye exhibits no discharge. No scleral icterus.  Neck: Neck supple. No JVD present. No tracheal deviation present. No thyromegaly present.  Cardiovascular: Normal rate, regular rhythm, normal heart sounds and intact distal pulses. Exam reveals no gallop and no friction rub.  No murmur heard. Pulmonary/Chest: Effort normal and breath sounds normal. No respiratory distress. He has no wheezes. He has no rales. He exhibits no tenderness.  Abdominal: Soft. Bowel sounds are normal. He exhibits no distension and no mass. There is no tenderness. There is no rebound and no guarding.  Genitourinary: Rectum normal and penis normal. Rectal exam shows guaiac negative stool. No penile tenderness.  Genitourinary Comments:  Prostate is surgically absent   Musculoskeletal: Normal range of motion. He exhibits no edema or tenderness.  Lymphadenopathy:    He has no cervical adenopathy.  Neurological: He is alert and oriented to person, place, and time. He has normal reflexes. He displays normal reflexes. No cranial nerve deficit. He exhibits normal muscle tone. Coordination normal.  Skin: Skin is warm and dry. No rash noted. He is not diaphoretic. No erythema. No pallor.  Psychiatric: He has a normal mood and affect. His behavior is normal. Judgment and thought content normal.          Assessment & Plan:  Well exam. We discussed diet and exercise. Set up fasting labs soon.  [name], MD

## 2018-10-18 ENCOUNTER — Ambulatory Visit: Payer: PPO | Admitting: Registered"

## 2018-10-18 LAB — CBC WITH DIFFERENTIAL/PLATELET
BASOS ABS: 0 10*3/uL (ref 0.0–0.1)
Basophils Relative: 0.4 % (ref 0.0–3.0)
EOS PCT: 2.6 % (ref 0.0–5.0)
Eosinophils Absolute: 0.2 10*3/uL (ref 0.0–0.7)
HCT: 43.6 % (ref 39.0–52.0)
Hemoglobin: 15.1 g/dL (ref 13.0–17.0)
LYMPHS ABS: 2.4 10*3/uL (ref 0.7–4.0)
Lymphocytes Relative: 25.8 % (ref 12.0–46.0)
MCHC: 34.6 g/dL (ref 30.0–36.0)
MCV: 95.6 fl (ref 78.0–100.0)
MONO ABS: 0.6 10*3/uL (ref 0.1–1.0)
MONOS PCT: 6.6 % (ref 3.0–12.0)
NEUTROS ABS: 5.9 10*3/uL (ref 1.4–7.7)
Neutrophils Relative %: 64.6 % (ref 43.0–77.0)
PLATELETS: 216 10*3/uL (ref 150.0–400.0)
RBC: 4.56 Mil/uL (ref 4.22–5.81)
RDW: 13.6 % (ref 11.5–15.5)
WBC: 9.2 10*3/uL (ref 4.0–10.5)

## 2018-10-18 LAB — POC URINALSYSI DIPSTICK (AUTOMATED)
BILIRUBIN UA: NEGATIVE
Glucose, UA: NEGATIVE
KETONES UA: NEGATIVE
Leukocytes, UA: NEGATIVE
Nitrite, UA: NEGATIVE
PROTEIN UA: POSITIVE — AB
RBC UA: NEGATIVE
Spec Grav, UA: 1.02 (ref 1.010–1.025)
Urobilinogen, UA: 0.2 E.U./dL
pH, UA: 5.5 (ref 5.0–8.0)

## 2018-10-18 LAB — T3, FREE: T3 FREE: 2.9 pg/mL (ref 2.3–4.2)

## 2018-10-18 LAB — LIPID PANEL
CHOL/HDL RATIO: 10
Cholesterol: 188 mg/dL (ref 0–200)
HDL: 19.2 mg/dL — ABNORMAL LOW (ref >39.00)
Triglycerides: 448 mg/dL — ABNORMAL HIGH (ref 0.0–149.0)

## 2018-10-18 LAB — BASIC METABOLIC PANEL WITH GFR
BUN: 27 mg/dL — ABNORMAL HIGH (ref 6–23)
CO2: 29 meq/L (ref 19–32)
Calcium: 9.4 mg/dL (ref 8.4–10.5)
Chloride: 100 meq/L (ref 96–112)
Creatinine, Ser: 1.38 mg/dL (ref 0.40–1.50)
GFR: 53.23 mL/min — ABNORMAL LOW
Glucose, Bld: 128 mg/dL — ABNORMAL HIGH (ref 70–99)
Potassium: 4.7 meq/L (ref 3.5–5.1)
Sodium: 136 meq/L (ref 135–145)

## 2018-10-18 LAB — HEPATIC FUNCTION PANEL
ALBUMIN: 4.4 g/dL (ref 3.5–5.2)
ALT: 18 U/L (ref 0–53)
AST: 12 U/L (ref 0–37)
Alkaline Phosphatase: 73 U/L (ref 39–117)
Bilirubin, Direct: 0.1 mg/dL (ref 0.0–0.3)
TOTAL PROTEIN: 7 g/dL (ref 6.0–8.3)
Total Bilirubin: 0.6 mg/dL (ref 0.2–1.2)

## 2018-10-18 LAB — TSH: TSH: 2.53 u[IU]/mL (ref 0.35–4.50)

## 2018-10-18 LAB — LDL CHOLESTEROL, DIRECT: LDL DIRECT: 90 mg/dL

## 2018-10-18 LAB — PSA: PSA: 0 ng/mL — ABNORMAL LOW (ref 0.10–4.00)

## 2018-10-18 LAB — HEMOGLOBIN A1C: HEMOGLOBIN A1C: 7 % — AB (ref 4.6–6.5)

## 2018-10-18 LAB — T4, FREE: Free T4: 0.94 ng/dL (ref 0.60–1.60)

## 2018-10-18 NOTE — Addendum Note (Signed)
Addended by: Elmer Picker on: 10/18/2018 07:42 AM   Modules accepted: Orders

## 2018-10-19 ENCOUNTER — Encounter: Payer: Self-pay | Admitting: *Deleted

## 2018-11-01 ENCOUNTER — Ambulatory Visit: Payer: PPO | Admitting: Registered"

## 2018-11-08 ENCOUNTER — Inpatient Hospital Stay: Payer: PPO | Attending: Hematology

## 2018-11-08 DIAGNOSIS — Z79899 Other long term (current) drug therapy: Secondary | ICD-10-CM | POA: Diagnosis not present

## 2018-11-08 DIAGNOSIS — E119 Type 2 diabetes mellitus without complications: Secondary | ICD-10-CM | POA: Insufficient documentation

## 2018-11-08 DIAGNOSIS — I10 Essential (primary) hypertension: Secondary | ICD-10-CM | POA: Diagnosis not present

## 2018-11-08 DIAGNOSIS — D472 Monoclonal gammopathy: Secondary | ICD-10-CM | POA: Insufficient documentation

## 2018-11-08 LAB — CMP (CANCER CENTER ONLY)
ALBUMIN: 3.9 g/dL (ref 3.5–5.0)
ALT: 14 U/L (ref 0–44)
ANION GAP: 7 (ref 5–15)
AST: 11 U/L — AB (ref 15–41)
Alkaline Phosphatase: 72 U/L (ref 38–126)
BILIRUBIN TOTAL: 0.5 mg/dL (ref 0.3–1.2)
BUN: 28 mg/dL — ABNORMAL HIGH (ref 8–23)
CHLORIDE: 104 mmol/L (ref 98–111)
CO2: 27 mmol/L (ref 22–32)
Calcium: 9 mg/dL (ref 8.9–10.3)
Creatinine: 1.47 mg/dL — ABNORMAL HIGH (ref 0.61–1.24)
GFR, EST NON AFRICAN AMERICAN: 46 mL/min — AB (ref >60)
GFR, Est AFR Am: 53 mL/min — ABNORMAL LOW (ref >60)
GLUCOSE: 119 mg/dL — AB (ref 70–99)
POTASSIUM: 4.2 mmol/L (ref 3.5–5.1)
Sodium: 138 mmol/L (ref 135–145)
TOTAL PROTEIN: 7.3 g/dL (ref 6.5–8.1)

## 2018-11-08 LAB — CBC WITH DIFFERENTIAL/PLATELET
ABS IMMATURE GRANULOCYTES: 0.01 10*3/uL (ref 0.00–0.07)
BASOS PCT: 0 %
Basophils Absolute: 0 10*3/uL (ref 0.0–0.1)
Eosinophils Absolute: 0.2 10*3/uL (ref 0.0–0.5)
Eosinophils Relative: 3 %
HCT: 41.5 % (ref 39.0–52.0)
HEMOGLOBIN: 14.1 g/dL (ref 13.0–17.0)
IMMATURE GRANULOCYTES: 0 %
LYMPHS PCT: 38 %
Lymphs Abs: 2.4 10*3/uL (ref 0.7–4.0)
MCH: 32.4 pg (ref 26.0–34.0)
MCHC: 34 g/dL (ref 30.0–36.0)
MCV: 95.4 fL (ref 80.0–100.0)
Monocytes Absolute: 0.4 10*3/uL (ref 0.1–1.0)
Monocytes Relative: 7 %
NEUTROS ABS: 3.3 10*3/uL (ref 1.7–7.7)
NEUTROS PCT: 52 %
PLATELETS: 182 10*3/uL (ref 150–400)
RBC: 4.35 MIL/uL (ref 4.22–5.81)
RDW: 13 % (ref 11.5–15.5)
WBC: 6.3 10*3/uL (ref 4.0–10.5)
nRBC: 0 % (ref 0.0–0.2)

## 2018-11-09 LAB — KAPPA/LAMBDA LIGHT CHAINS
KAPPA FREE LGHT CHN: 11.3 mg/L (ref 3.3–19.4)
Kappa, lambda light chain ratio: 0.31 (ref 0.26–1.65)
Lambda free light chains: 36.4 mg/L — ABNORMAL HIGH (ref 5.7–26.3)

## 2018-11-11 DIAGNOSIS — H40052 Ocular hypertension, left eye: Secondary | ICD-10-CM | POA: Diagnosis not present

## 2018-11-14 NOTE — Progress Notes (Signed)
HEMATOLOGY/ONCOLOGY CONSULTATION NOTE  Date of Service: 11/15/2018  Patient Care Team: Laurey Morale, MD as PCP - General (Family Medicine)  CHIEF COMPLAINTS/PURPOSE OF CONSULTATION:  Positive M-Spike   HISTORY OF PRESENTING ILLNESS:   ARSENIY TOOMEY 76 y.o. male is a here because of positive M-spike. The patient was referred by nephrologist Dr. Hollie Salk. The patient presents to the clinic today accompanied by his wife.  He is being seen by nephrologist who pursued further work up to why his Creatinine has increased. Upon workup his M-protein was found to be 0.8g/dl.  He has had HTN and DM for many years that can affects his Kidney function and was thought to be the likely cause of his CKD.   Today his wife notes he has had muscle pain, muscle mass loss and fatigue.   He has HTN, DM, GERD, Hypothyroid, Hyperlipidemia. He was recently taken off statins, meloxicam, ASA and metformin. He takes Nexium for her GERD and has been on for many years.   On review of symptoms, pt notes purposeful weight loss, muscle loss and muscle pain. He also has leg pain from b/l knees to anterior shin.  Interval History:   ZEESHAN KORTE returns today regarding his M-spike. The patient's last visit with [place] was on 05/18/18. He is accompanied today by his wife. The pt reports that he is doing well overall.   The pt reports that he has not developed any new concerns in the past 6 months. He has not developed any new bone or back pains. He notes that he continues to play golf twice a week and also continues working. He denies abdominal pains.   Lab results (11/08/18) of CBC w/diff and CMP is as follows: all values are WNL except for Glucose at 119, BUN at 28, Creatinine at 1.47, AST at 11, GFR at 46. 11/08/18 SFLC revealed Kappa at 11.3, Lambda at 36.4, and K:L ratio at 0.31  On review of systems, pt reports staying active, stable energy levels, eating well, stable weight, and denies new bone pains,  new back pains, abdominal pains, leg swelling, and any other symptoms.    MEDICAL HISTORY:  Past Medical History:  Diagnosis Date  . Arthritis    back  . Cancer [facility])    prostate, sees Dr. Raynelle Bring   . Diabetes mellitus   . Erectile dysfunction   . GERD (gastroesophageal reflux disease)   . Hyperlipidemia   . Hypertension   . Migraines   . Peptic ulcer     SURGICAL HISTORY: Past Surgical History:  Procedure Laterality Date  . APPENDECTOMY  1950  . COLONOSCOPY  11/14/2015   per Dr. Fuller Plan, adenomatous polyps, repeat in 5 yrs   . HERNIA REPAIR     umbilical   . MENISCUS REPAIR Left   . PROSTATE SURGERY  2009   radical prostatectomy  . SPINE SURGERY     L4-L5 fusion per Dr. Glenna Fellows   . TONSILLECTOMY AND ADENOIDECTOMY  1954    SOCIAL HISTORY: Social History   Socioeconomic History  . Marital status: Married    Spouse name: Not on file  . Number of children: Not on file  . Years of education: Not on file  . Highest education level: Not on file  Occupational History  . Not on file  Social Needs  . Financial resource strain: Not on file  . Food insecurity:    Worry: Not on file    Inability: Not on file  .  Transportation needs:    Medical: Not on file    Non-medical: Not on file  Tobacco Use  . Smoking status: Never Smoker  . Smokeless tobacco: Never Used  Substance and Sexual Activity  . Alcohol use: Yes    Alcohol/week: 0.0 standard drinks    Comment: rare  . Drug use: No  . Sexual activity: Not on file  Lifestyle  . Physical activity:    Days per week: Not on file    Minutes per session: Not on file  . Stress: Not on file  Relationships  . Social connections:    Talks on phone: Not on file    Gets together: Not on file    Attends religious service: Not on file    Active member of club or organization: Not on file    Attends meetings of clubs or organizations: Not on file    Relationship status: Not on file  . Intimate partner violence:     Fear of current or ex partner: Not on file    Emotionally abused: Not on file    Physically abused: Not on file    Forced sexual activity: Not on file  Other Topics Concern  . Not on file  Social History Narrative  . Not on file    FAMILY HISTORY: Family History  Problem Relation Age of Onset  . Hypertension Unknown   . Cancer Unknown   . Colon cancer Neg Hx     ALLERGIES:  has No Known Allergies.  MEDICATIONS:  Current Outpatient Medications  Medication Sig Dispense Refill  . butalbital-acetaminophen-caffeine (FIORICET, ESGIC) 50-325-40 MG tablet Take 2 tablets by mouth every 4 (four) hours as needed for headache. 100 tablet 5  . Cholecalciferol (VITAMIN D3) 2000 units capsule Take by mouth.    . Cyanocobalamin (VITAMIN B 12 PO) Take by mouth.    . esomeprazole (NEXIUM) 20 MG capsule Take 1 capsule (20 mg total) by mouth daily at 12 noon. 90 capsule 3  . FOLIC ACID PO Take by mouth.    Marland Kitchen glipiZIDE (GLUCOTROL) 10 MG tablet Take 1 tablet (10 mg total) by mouth 2 (two) times daily before a meal. 180 tablet 3  . Omega-3 Fatty Acids (FISH OIL) 1000 MG CAPS Take by mouth.    . SYNTHROID 137 MCG tablet Take 1 tablet (137 mcg total) by mouth daily before breakfast. 90 tablet 3  . tamsulosin (FLOMAX) 0.4 MG CAPS capsule Take 1 capsule (0.4 mg total) by mouth daily. 90 capsule 3   No current facility-administered medications for this visit.     REVIEW OF SYSTEMS:    A 10+ POINT REVIEW OF SYSTEMS WAS OBTAINED including neurology, dermatology, psychiatry, cardiac, respiratory, lymph, extremities, GI, GU, Musculoskeletal, constitutional, breasts, reproductive, HEENT.  All pertinent positives are noted in the HPI.  All others are negative.    PHYSICAL EXAMINATION: ECOG PERFORMANCE STATUS: 1 - Symptomatic but completely ambulatory  . Vitals:   11/15/18 1455  BP: 121/75  Pulse: 78  Resp: 18  Temp: 98.2 F (36.8 C)  SpO2: 99%   Filed Weights   11/15/18 1455  Weight: 232 lb 14.4  oz (105.6 kg)   .Body mass index is 33.42 kg/m.  GENERAL:alert, in no acute distress and comfortable SKIN: no acute rashes, no significant lesions EYES: conjunctiva are pink and non-injected, sclera anicteric OROPHARYNX: MMM, no exudates, no oropharyngeal erythema or ulceration NECK: supple, no JVD LYMPH:  no palpable lymphadenopathy in the cervical, axillary or inguinal regions LUNGS:  clear to auscultation b/l with normal respiratory effort HEART: regular rate & rhythm ABDOMEN:  normoactive bowel sounds , non tender, not distended. No palpable hepatosplenomegaly.  Extremity: no pedal edema PSYCH: alert & oriented x 3 with fluent speech NEURO: no focal motor/sensory deficits   LABORATORY DATA:  I have reviewed the data as listed  . CBC Latest Ref Rng & Units 11/08/2018 10/18/2018 05/04/2018  WBC 4.0 - 10.5 K/uL 6.3 9.2 6.2  Hemoglobin 13.0 - 17.0 g/dL 14.1 15.1 14.2  Hematocrit 39.0 - 52.0 % 41.5 43.6 41.3  Platelets 150 - 400 K/uL 182 216.0 216   . CBC    Component Value Date/Time   WBC 6.3 11/08/2018 1119   RBC 4.35 11/08/2018 1119   HGB 14.1 11/08/2018 1119   HCT 41.5 11/08/2018 1119   PLT 182 11/08/2018 1119   MCV 95.4 11/08/2018 1119   MCH 32.4 11/08/2018 1119   MCHC 34.0 11/08/2018 1119   RDW 13.0 11/08/2018 1119   LYMPHSABS 2.4 11/08/2018 1119   MONOABS 0.4 11/08/2018 1119   EOSABS 0.2 11/08/2018 1119   BASOSABS 0.0 11/08/2018 1119    . CMP Latest Ref Rng & Units 11/08/2018 10/18/2018 05/04/2018  Glucose 70 - 99 mg/dL 119(H) 128(H) 69(L)  BUN 8 - 23 mg/dL 28(H) 27(H) 35(H)  Creatinine 0.61 - 1.24 mg/dL 1.47(H) 1.38 1.61(H)  Sodium 135 - 145 mmol/L 138 136 137  Potassium 3.5 - 5.1 mmol/L 4.2 4.7 4.5  Chloride 98 - 111 mmol/L 104 100 104  CO2 22 - 32 mmol/L _0 Calcium 8.9 - 10.3 mg/dL 9.0 9.4 9.4  Total Protein 6.5 - 8.1 g/dL 7.3 7.0 7.8  Total Bilirubin 0.3 - 1.2 mg/dL 0.5 0.6 0.4  Alkaline Phos 38 - 126 U/L 72 73 89  AST 15 - 41 U/L 11(L) 12 17    ALT 0 - 44 U/L 14 18 32   Component     Latest Ref Rng & Units 05/04/2018  Iron     42 - 163 ug/dL 75  TIBC     202 - 409 ug/dL 255  Saturation Ratios     42 - 163 % 29 (L)  UIBC     ug/dL 180  LDH     125 - 245 U/L 143  Sed Rate     0 - 16 mm/hr 20 (H)  Ferritin     22 - 316 ng/mL 167  Intrinsic Factor     0.0 - 1.1 AU/mL 1.0  Parietal Cell Antibody-IgG     0.0 - 20.0 Units 1.9   Component     Latest Ref Rng & Units 05/09/2018  Total Protein, Urine-UPE24     Not Estab. mg/dL 7.6  Total Protein, Urine-Ur/day     30 - 150 mg/24 hr 148  ALBUMIN, U     % 24.2  ALPHA 1 URINE     % 3.9  Alpha 2, Urine     % 12.7  % BETA, Urine     % 26.6  GAMMA GLOBULIN URINE     % 32.6  Free Kappa Lt Chains,Ur     1.35 - 24.19 mg/L 82.60 (H)  Free Lambda Lt Chains,Ur     0.24 - 6.66 mg/L 16.80 (H)  Free Kappa/Lambda Ratio     2.04 - 10.37 4.92  Immunofixation Result, Urine      Comment  Total Volume      1,950  M-SPIKE %, Urine  Not Observed % Not Observed  NOTE:      Comment      OUTSIDE LABS 04/21/18       RADIOGRAPHIC STUDIES: I have personally reviewed the radiological images as listed and agreed with the findings in the report. No results found.  ASSESSMENT & PLAN:   SVEN PINHEIRO is a 77 y.o. caucasian male with    1. IgG Lambda monoclonal paraproteinemia  He has a M-spike of 0.8g/dl and normal SFLC and ratio. No anemia - nl Hgb No focal bone pains --- Skeletal survey - shows no  No hypercalcemia. 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  05/10/18 Metastatic Bone Survey revealed no lytic lesions on skeletal survey to suggest multiple myeloma.   PLAN:   -Discussed pt labwork from 11/08/18; all blood counts are normal, Creatinine improved over 6 months to 1.47, other chemistries are  stable. SFLC are stable with Lambda slightly elevated at 36.4 -11/08/18 MMP is pending, will follow up with pt with results  -No hypercalcemia, no worsened renal function, no anemia, no new bone pains -Discussed that the patient's clinical and lab presentation are most consistent with MGUS and does not reveal an overt concern for multiple myeloma at this time  -No indication for a BM Bx at this time -I discussed his HTN and DM and past NSAID use are the more obvious causes to his elevation of Creatine and CKD -His US Renal from 04/14/18 showed no renal abnormality/urinary obstruction along with 1 simple cyst of left kidney.  -Continue sublingual Vitamin B12 replacement as managed by PCP -Continue optimizing management and care of diabetes and hypertension -Will see the pt back in 12 months  2. 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 Hollie Salk  -mild non nephrotic proteinuria on 24h UPEP-Continue follow up with Dr Hollie Salk for CKD    3. Vitamin B12 deficient  Antibody testing done - no evidence of pernicious anemia. No evidence of pernicious anemia based on neg IF and parietal cell ab -He has been actively losing weight by eating healthy with no diet restrictions.  -I discussed the possible cause of this deficiency such as long term use of Nexium and Metformin. He is no longer on Metformin.  -continue B12 replacement per PCP  4.  Past Medical History:  Diagnosis Date  . Arthritis    back  . Cancer [facility])    prostate, sees Dr. Raynelle Bring   . Diabetes mellitus   . Erectile dysfunction   . GERD (gastroesophageal reflux disease)   . Hyperlipidemia   . Hypertension   . Migraines   . Peptic ulcer      RTC with Dr Irene Limbo with labs in 12 months. Labs 1 week prior to clinic visit   All of the patients questions were answered with apparent satisfaction. The patient knows to call the clinic with any problems, questions or concerns.  The total time spent in the appt was 20  minutes and more than 50% was on counseling and direct patient cares.    Sullivan Lone MD [place] AAHIVMS [facility] Hematology/Oncology Physician [facility]  (Office):       [phone] (Work cell):  [phone] (Fax):           [phone]  11/15/2018 3:15 PM  I, Baldwin [place], am acting as a scribe for Dr. Sullivan Lone.   .I have reviewed the above documentation for accuracy and completeness, and I agree with the above. [name] MD

## 2018-11-15 ENCOUNTER — Telehealth: Payer: Self-pay | Admitting: Hematology

## 2018-11-15 ENCOUNTER — Inpatient Hospital Stay (HOSPITAL_BASED_OUTPATIENT_CLINIC_OR_DEPARTMENT_OTHER): Payer: PPO | Admitting: Hematology

## 2018-11-15 VITALS — BP 121/75 | HR 78 | Temp 98.2°F | Resp 18 | Ht 70.0 in | Wt 232.9 lb

## 2018-11-15 DIAGNOSIS — D472 Monoclonal gammopathy: Secondary | ICD-10-CM

## 2018-11-15 DIAGNOSIS — E119 Type 2 diabetes mellitus without complications: Secondary | ICD-10-CM | POA: Diagnosis not present

## 2018-11-15 DIAGNOSIS — I10 Essential (primary) hypertension: Secondary | ICD-10-CM | POA: Diagnosis not present

## 2018-11-15 DIAGNOSIS — Z79899 Other long term (current) drug therapy: Secondary | ICD-10-CM

## 2018-11-15 NOTE — Telephone Encounter (Signed)
Gave patient avs report and appointments for December 2020.  °

## 2018-11-16 LAB — MULTIPLE MYELOMA PANEL, SERUM
ALBUMIN SERPL ELPH-MCNC: 3.7 g/dL (ref 2.9–4.4)
ALPHA2 GLOB SERPL ELPH-MCNC: 0.8 g/dL (ref 0.4–1.0)
Albumin/Glob SerPl: 1.3 (ref 0.7–1.7)
Alpha 1: 0.3 g/dL (ref 0.0–0.4)
B-Globulin SerPl Elph-Mcnc: 0.8 g/dL (ref 0.7–1.3)
Gamma Glob SerPl Elph-Mcnc: 1.2 g/dL (ref 0.4–1.8)
Globulin, Total: 3 g/dL (ref 2.2–3.9)
IGG (IMMUNOGLOBIN G), SERUM: 1119 mg/dL (ref 700–1600)
IGM (IMMUNOGLOBULIN M), SRM: 47 mg/dL (ref 15–143)
IgA: 93 mg/dL (ref 61–437)
M Protein SerPl Elph-Mcnc: 0.9 g/dL — ABNORMAL HIGH
TOTAL PROTEIN ELP: 6.7 g/dL (ref 6.0–8.5)

## 2018-12-08 ENCOUNTER — Other Ambulatory Visit: Payer: Self-pay | Admitting: Family Medicine

## 2019-01-17 ENCOUNTER — Ambulatory Visit (INDEPENDENT_AMBULATORY_CARE_PROVIDER_SITE_OTHER): Payer: PPO | Admitting: Family Medicine

## 2019-01-17 ENCOUNTER — Other Ambulatory Visit: Payer: Self-pay | Admitting: Family Medicine

## 2019-01-17 ENCOUNTER — Encounter: Payer: Self-pay | Admitting: Family Medicine

## 2019-01-17 VITALS — BP 120/60 | HR 57 | Temp 99.0°F | Wt 224.2 lb

## 2019-01-17 DIAGNOSIS — J019 Acute sinusitis, unspecified: Secondary | ICD-10-CM | POA: Diagnosis not present

## 2019-01-17 DIAGNOSIS — Z0000 Encounter for general adult medical examination without abnormal findings: Secondary | ICD-10-CM

## 2019-01-17 MED ORDER — HYDROCODONE-HOMATROPINE 5-1.5 MG/5ML PO SYRP: 5.0000 mL | ORAL_SOLUTION | ORAL | 0 refills | Status: DC | PRN

## 2019-01-17 MED ORDER — AMOXICILLIN-POT CLAVULANATE 875-125 MG PO TABS: 1.0000 | ORAL_TABLET | Freq: Two times a day (BID) | ORAL | 0 refills | Status: DC

## 2019-01-17 NOTE — Progress Notes (Signed)
   Subjective:    Patient ID: [name], male    DOB: [birth year], 78 y.o.   MRN: [id number]  HPI Here for 10 days of sinus congestion, PND, headaches, fever, and coughing up yellow sputum.    Review of Systems  Constitutional: Positive for fever.  HENT: Positive for congestion, postnasal drip, sinus pressure and sinus pain. Negative for sore throat.   Eyes: Negative.   Respiratory: Positive for cough.   Neurological: Positive for headaches.       Objective:   Physical Exam Constitutional:      Appearance: Normal appearance.  HENT:     Right Ear: Tympanic membrane and ear canal normal.     Left Ear: Tympanic membrane and ear canal normal.     Nose: Nose normal.     Mouth/Throat:     Pharynx: Oropharynx is clear.  Eyes:     Conjunctiva/sclera: Conjunctivae normal.  Pulmonary:     Effort: Pulmonary effort is normal. No respiratory distress.     Breath sounds: Normal breath sounds. No stridor. No wheezing, rhonchi or rales.  Lymphadenopathy:     Cervical: No cervical adenopathy.  Neurological:     Mental Status: He is alert.           Assessment & Plan:  Sinusitis, treat with Augmentin and Mucinex.  [name], MD

## 2019-01-18 NOTE — Telephone Encounter (Signed)
Last refill given on 11/18 for #100 with 5 ref

## 2019-02-11 ENCOUNTER — Other Ambulatory Visit: Payer: Self-pay | Admitting: Family Medicine

## 2019-02-11 DIAGNOSIS — Z0000 Encounter for general adult medical examination without abnormal findings: Secondary | ICD-10-CM

## 2019-02-13 NOTE — Telephone Encounter (Signed)
Dr. Fry please advise on refill. Thanks  

## 2019-02-15 NOTE — Telephone Encounter (Signed)
Call in #100 with 5 rf

## 2019-02-16 NOTE — Telephone Encounter (Signed)
Refill has been called to the pharmacy and left on the VM.  

## 2019-02-28 ENCOUNTER — Telehealth: Payer: Self-pay | Admitting: Family Medicine

## 2019-02-28 DIAGNOSIS — Z0000 Encounter for general adult medical examination without abnormal findings: Secondary | ICD-10-CM

## 2019-02-28 NOTE — Telephone Encounter (Signed)
This was called to the pharmacy on 3/19

## 2019-02-28 NOTE — Telephone Encounter (Signed)
Dr. Sarajane Jews please advise on refill please.  thanks

## 2019-02-28 NOTE — Telephone Encounter (Signed)
I already sent this in on 02-16-19

## 2019-04-12 DIAGNOSIS — I129 Hypertensive chronic kidney disease with stage 1 through stage 4 chronic kidney disease, or unspecified chronic kidney disease: Secondary | ICD-10-CM | POA: Diagnosis not present

## 2019-04-12 DIAGNOSIS — E1122 Type 2 diabetes mellitus with diabetic chronic kidney disease: Secondary | ICD-10-CM | POA: Diagnosis not present

## 2019-04-12 DIAGNOSIS — Z8546 Personal history of malignant neoplasm of prostate: Secondary | ICD-10-CM | POA: Diagnosis not present

## 2019-04-12 DIAGNOSIS — D472 Monoclonal gammopathy: Secondary | ICD-10-CM | POA: Diagnosis not present

## 2019-04-12 DIAGNOSIS — E785 Hyperlipidemia, unspecified: Secondary | ICD-10-CM | POA: Diagnosis not present

## 2019-04-12 DIAGNOSIS — R809 Proteinuria, unspecified: Secondary | ICD-10-CM | POA: Diagnosis not present

## 2019-04-12 DIAGNOSIS — N183 Chronic kidney disease, stage 3 (moderate): Secondary | ICD-10-CM | POA: Diagnosis not present

## 2019-04-21 ENCOUNTER — Other Ambulatory Visit: Payer: Self-pay | Admitting: Family Medicine

## 2019-04-21 DIAGNOSIS — Z0000 Encounter for general adult medical examination without abnormal findings: Secondary | ICD-10-CM

## 2019-05-11 DIAGNOSIS — N183 Chronic kidney disease, stage 3 (moderate): Secondary | ICD-10-CM | POA: Diagnosis not present

## 2019-05-11 DIAGNOSIS — R809 Proteinuria, unspecified: Secondary | ICD-10-CM | POA: Diagnosis not present

## 2019-05-31 DIAGNOSIS — H5202 Hypermetropia, left eye: Secondary | ICD-10-CM | POA: Diagnosis not present

## 2019-05-31 DIAGNOSIS — E119 Type 2 diabetes mellitus without complications: Secondary | ICD-10-CM | POA: Diagnosis not present

## 2019-05-31 DIAGNOSIS — H52223 Regular astigmatism, bilateral: Secondary | ICD-10-CM | POA: Diagnosis not present

## 2019-05-31 DIAGNOSIS — H25813 Combined forms of age-related cataract, bilateral: Secondary | ICD-10-CM | POA: Diagnosis not present

## 2019-05-31 DIAGNOSIS — H5211 Myopia, right eye: Secondary | ICD-10-CM | POA: Diagnosis not present

## 2019-05-31 DIAGNOSIS — H1789 Other corneal scars and opacities: Secondary | ICD-10-CM | POA: Diagnosis not present

## 2019-05-31 DIAGNOSIS — H524 Presbyopia: Secondary | ICD-10-CM | POA: Diagnosis not present

## 2019-08-15 ENCOUNTER — Telehealth: Payer: Self-pay | Admitting: Family Medicine

## 2019-08-15 DIAGNOSIS — Z0000 Encounter for general adult medical examination without abnormal findings: Secondary | ICD-10-CM

## 2019-08-16 ENCOUNTER — Other Ambulatory Visit: Payer: Self-pay | Admitting: Family Medicine

## 2019-08-16 DIAGNOSIS — Z0000 Encounter for general adult medical examination without abnormal findings: Secondary | ICD-10-CM

## 2019-08-16 NOTE — Telephone Encounter (Signed)
Spoke to pt and advised that I did not see on pt med list. Will route to Dr. Sarajane Jews for advise if okay to refill. Pt has been scheduled for CPE.

## 2019-08-16 NOTE — Telephone Encounter (Signed)
Pt stated he was calling for an update on his refill request. Pt stated he is completely out of the medication.

## 2019-08-17 MED ORDER — LOSARTAN POTASSIUM 100 MG PO TABS: 100.0000 mg | ORAL_TABLET | Freq: Every day | ORAL | 3 refills | Status: DC

## 2019-08-17 NOTE — Addendum Note (Signed)
Addended by: [name] A on: 08/17/2019 11:18 AM   Modules accepted: Orders

## 2019-08-17 NOTE — Telephone Encounter (Signed)
I sent in the refill.

## 2019-08-17 NOTE — Telephone Encounter (Signed)
Noted! Pt notified of update.  

## 2019-08-29 DIAGNOSIS — D225 Melanocytic nevi of trunk: Secondary | ICD-10-CM | POA: Diagnosis not present

## 2019-08-29 DIAGNOSIS — Z85820 Personal history of malignant melanoma of skin: Secondary | ICD-10-CM | POA: Diagnosis not present

## 2019-08-29 DIAGNOSIS — L570 Actinic keratosis: Secondary | ICD-10-CM | POA: Diagnosis not present

## 2019-08-29 DIAGNOSIS — D1801 Hemangioma of skin and subcutaneous tissue: Secondary | ICD-10-CM | POA: Diagnosis not present

## 2019-08-29 DIAGNOSIS — M720 Palmar fascial fibromatosis [Dupuytren]: Secondary | ICD-10-CM | POA: Diagnosis not present

## 2019-08-29 DIAGNOSIS — L821 Other seborrheic keratosis: Secondary | ICD-10-CM | POA: Diagnosis not present

## 2019-08-29 DIAGNOSIS — M6740 Ganglion, unspecified site: Secondary | ICD-10-CM | POA: Diagnosis not present

## 2019-08-29 DIAGNOSIS — L820 Inflamed seborrheic keratosis: Secondary | ICD-10-CM | POA: Diagnosis not present

## 2019-10-01 IMAGING — DX DG BONE SURVEY MET
9 of 10 series · 9 of 10 positions shown · non-contrast
Comparison: None

CLINICAL DATA: Monoclonal paraproteinemia

EXAM:
METASTATIC BONE SURVEY

[skull lat]
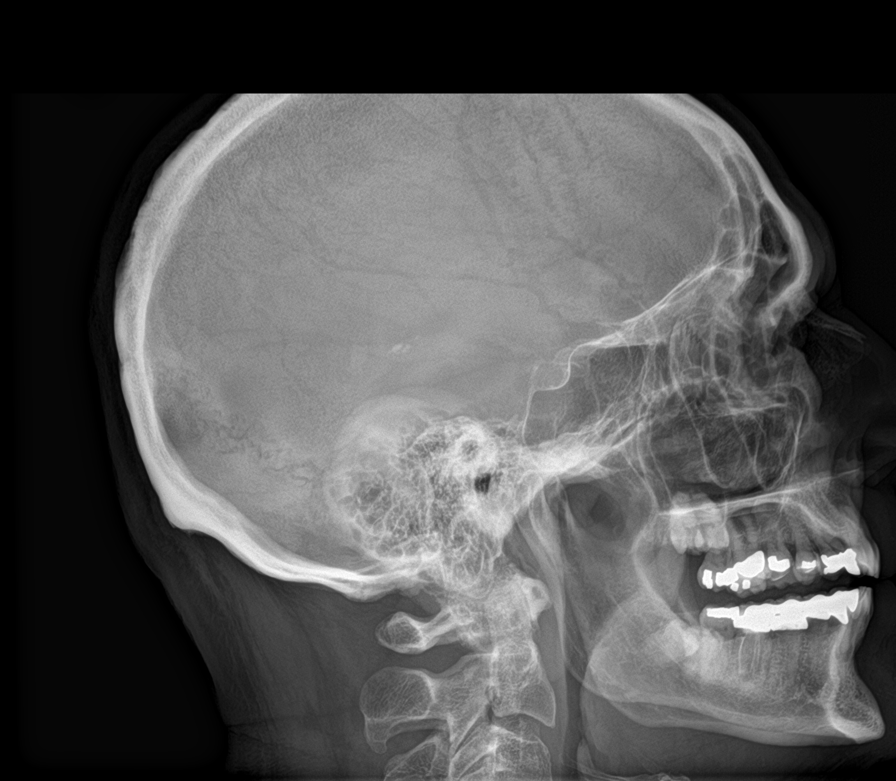

[shoulder ap (1 of 2)]
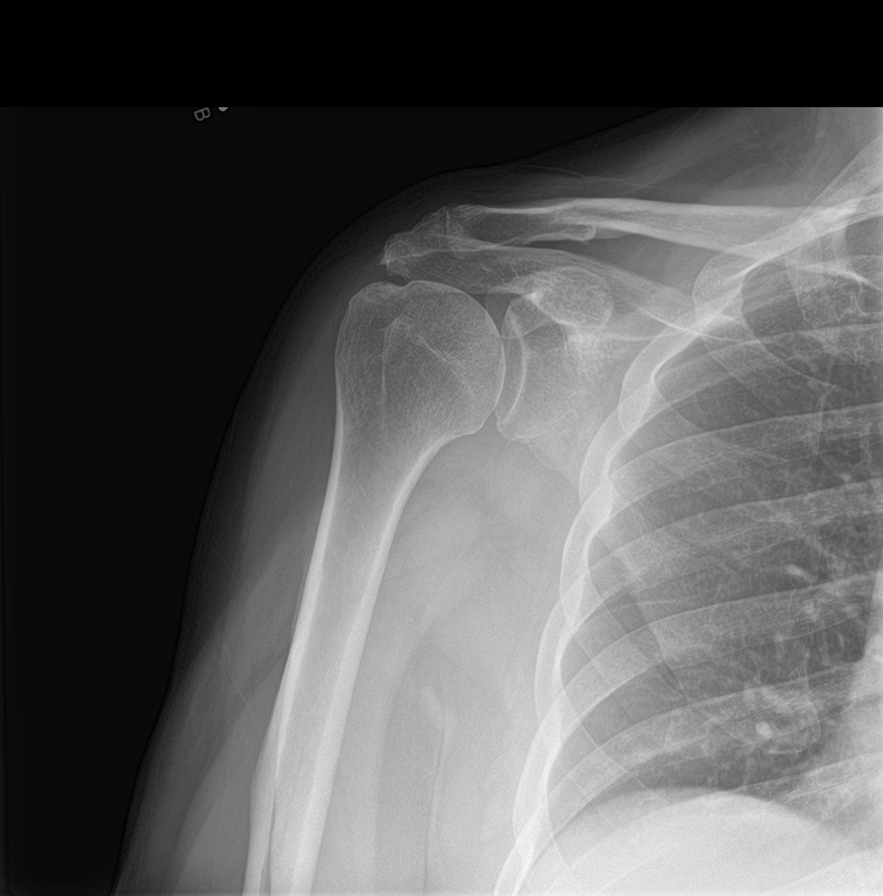

[shoulder ap (2 of 2)]
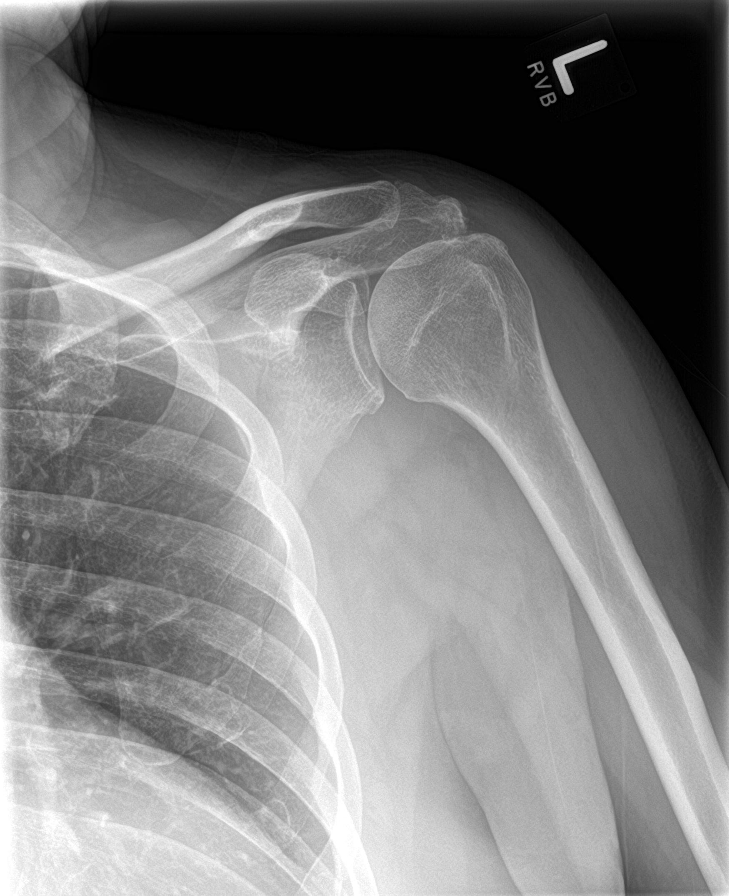

[humerus ap (1 of 2)]
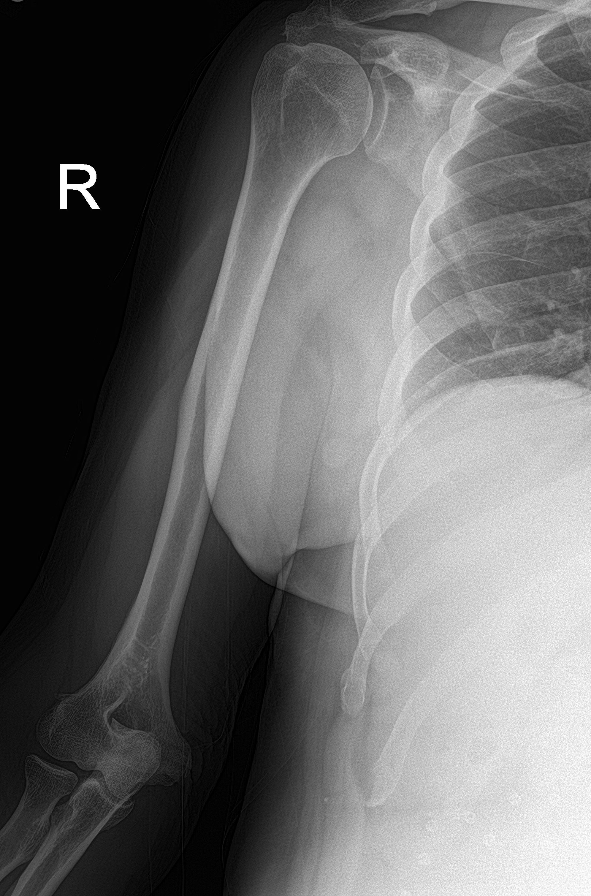

[humerus ap (2 of 2)]
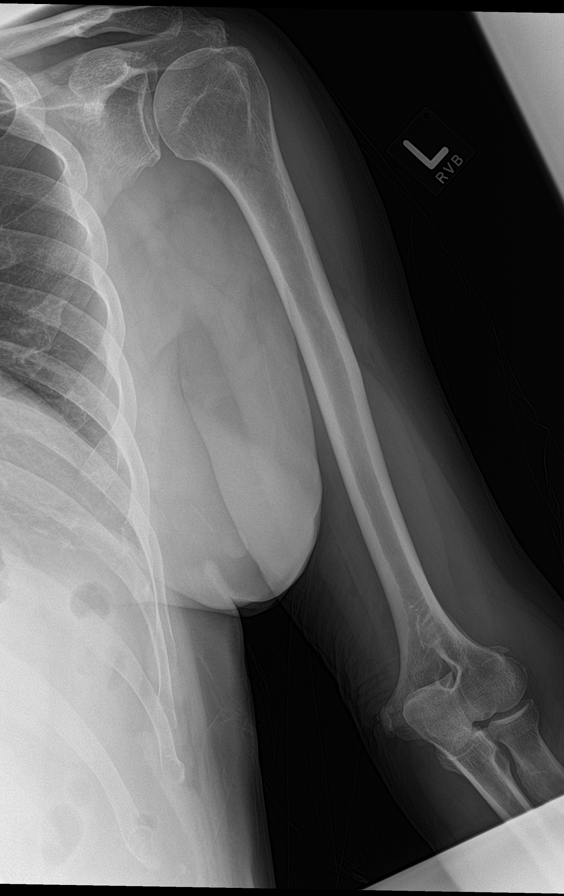

[forearm ap (1 of 2)]
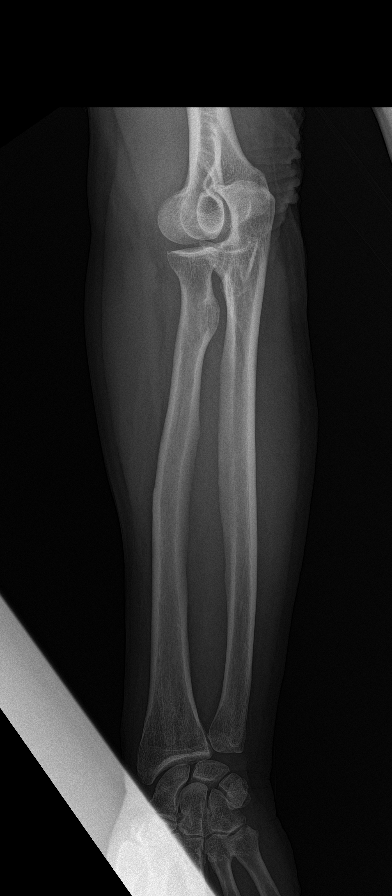

[forearm ap (2 of 2)]
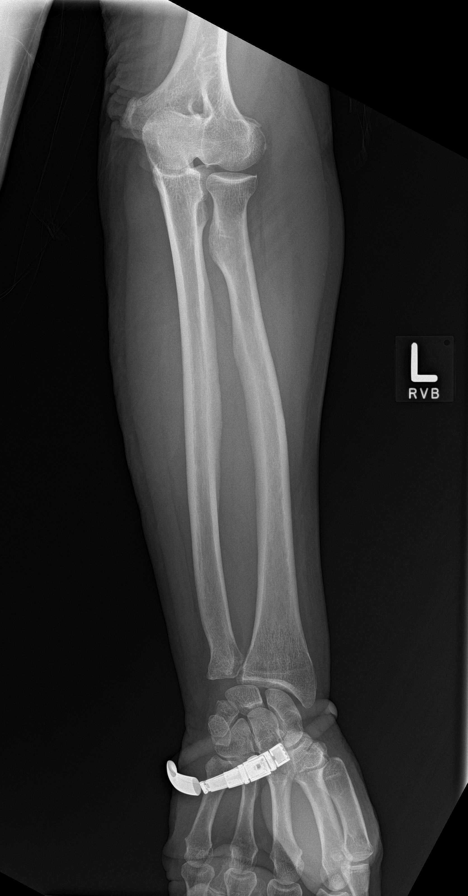

[c-spine ap]
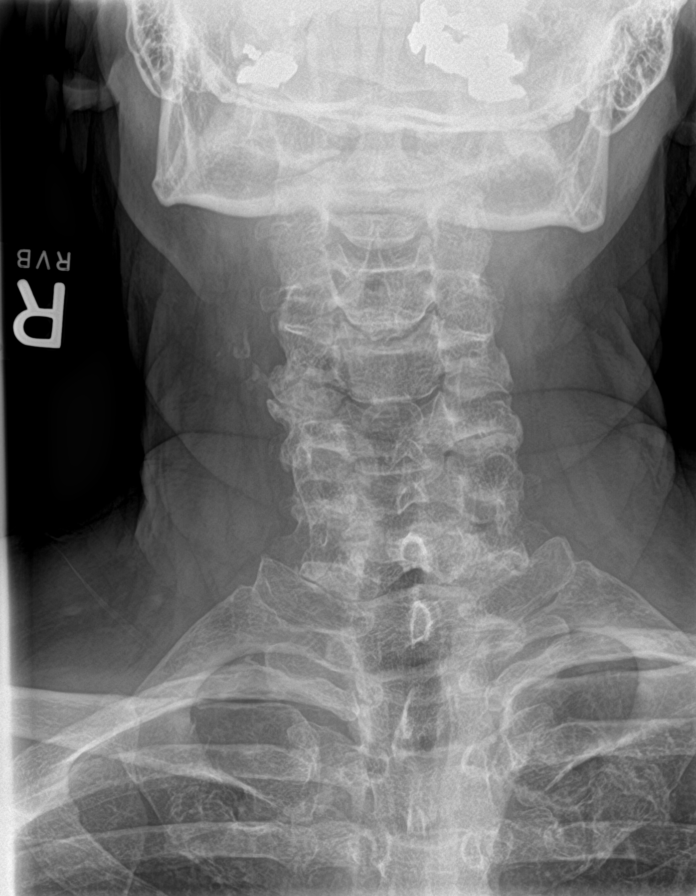

[c-spine lat]
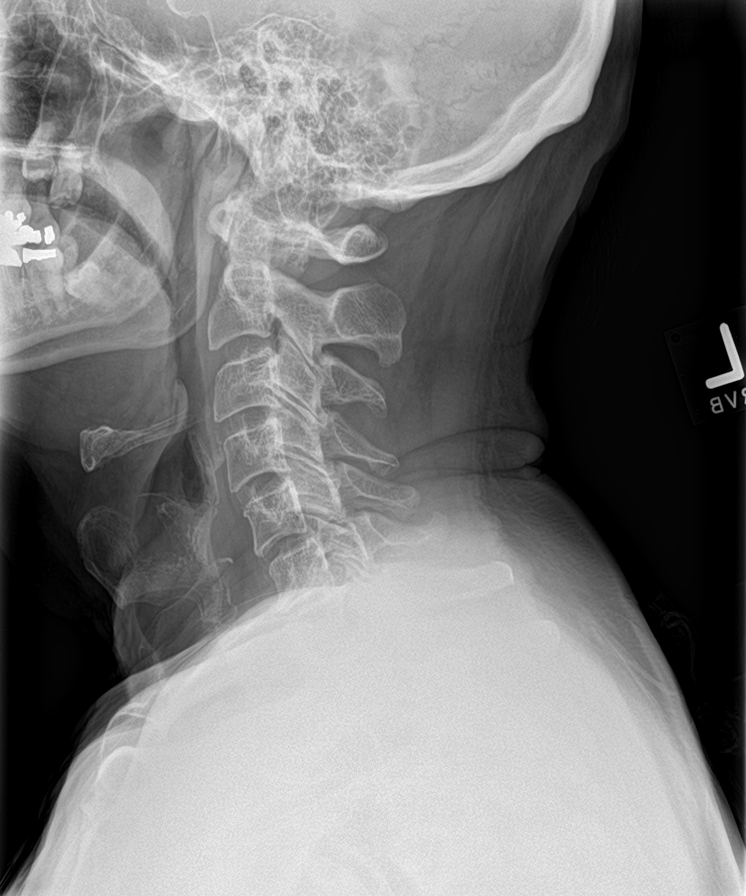

[9 of 10 positions shown; findings below may reference images not displayed]

FINDINGS: No lytic lesion in the calvarium. No lytic lesion in the cervical
spine. No lytic lesions in the ribs or scapula on chest view. No
lytic lesion in the LEFT or RIGHT proximal humerus. No lytic lesion
is distal humeri. No lytic lesions the forearms. No lytic lesions in
the thoracic spine. No lytic lesions in the lumbar spine. Posterior
lumbar fusion noted. No lytic lesions pelvis or proximal femurs.
Lytic lesions in the mid and distal femurs. No lytic lesions in the
tibia fibula.
IMPRESSION: No lytic lesions on skeletal survey to suggest multiple myeloma.

## 2019-10-23 ENCOUNTER — Other Ambulatory Visit: Payer: Self-pay

## 2019-10-24 ENCOUNTER — Other Ambulatory Visit: Payer: Self-pay | Admitting: Family Medicine

## 2019-10-24 ENCOUNTER — Encounter: Payer: Self-pay | Admitting: Family Medicine

## 2019-10-24 ENCOUNTER — Ambulatory Visit (INDEPENDENT_AMBULATORY_CARE_PROVIDER_SITE_OTHER): Payer: PPO | Admitting: Family Medicine

## 2019-10-24 VITALS — BP 120/60 | HR 65 | Temp 97.9°F | Ht 69.75 in | Wt 223.0 lb

## 2019-10-24 DIAGNOSIS — Z125 Encounter for screening for malignant neoplasm of prostate: Secondary | ICD-10-CM

## 2019-10-24 DIAGNOSIS — Z0000 Encounter for general adult medical examination without abnormal findings: Secondary | ICD-10-CM

## 2019-10-24 DIAGNOSIS — E039 Hypothyroidism, unspecified: Secondary | ICD-10-CM | POA: Diagnosis not present

## 2019-10-24 DIAGNOSIS — E119 Type 2 diabetes mellitus without complications: Secondary | ICD-10-CM

## 2019-10-24 LAB — HEPATIC FUNCTION PANEL
ALT: 23 U/L (ref 0–53)
AST: 15 U/L (ref 0–37)
Albumin: 4.1 g/dL (ref 3.5–5.2)
Alkaline Phosphatase: 68 U/L (ref 39–117)
Bilirubin, Direct: 0.1 mg/dL (ref 0.0–0.3)
Total Bilirubin: 0.9 mg/dL (ref 0.2–1.2)
Total Protein: 7.3 g/dL (ref 6.0–8.3)

## 2019-10-24 LAB — BASIC METABOLIC PANEL
BUN: 28 mg/dL — ABNORMAL HIGH (ref 6–23)
CO2: 28 mEq/L (ref 19–32)
Calcium: 9.3 mg/dL (ref 8.4–10.5)
Chloride: 99 mEq/L (ref 96–112)
Creatinine, Ser: 1.45 mg/dL (ref 0.40–1.50)
GFR: 47.18 mL/min — ABNORMAL LOW (ref >60.00)
Glucose, Bld: 74 mg/dL (ref 70–99)
Potassium: 4.5 mEq/L (ref 3.5–5.1)
Sodium: 134 mEq/L — ABNORMAL LOW (ref 135–145)

## 2019-10-24 LAB — LIPID PANEL
Cholesterol: 147 mg/dL (ref 0–200)
HDL: 19.6 mg/dL — ABNORMAL LOW (ref >39.00)
NonHDL: 127.13
Total CHOL/HDL Ratio: 7
Triglycerides: 346 mg/dL — ABNORMAL HIGH (ref 0.0–149.0)
VLDL: 69.2 mg/dL — ABNORMAL HIGH (ref 0.0–40.0)

## 2019-10-24 LAB — T4, FREE: Free T4: 0.99 ng/dL (ref 0.60–1.60)

## 2019-10-24 LAB — CBC WITH DIFFERENTIAL/PLATELET
Basophils Absolute: 0 10*3/uL (ref 0.0–0.1)
Basophils Relative: 0.4 % (ref 0.0–3.0)
Eosinophils Absolute: 0.2 10*3/uL (ref 0.0–0.7)
Eosinophils Relative: 2.6 % (ref 0.0–5.0)
HCT: 41.1 % (ref 39.0–52.0)
Hemoglobin: 14 g/dL (ref 13.0–17.0)
Lymphocytes Relative: 37.9 % (ref 12.0–46.0)
Lymphs Abs: 2.7 10*3/uL (ref 0.7–4.0)
MCHC: 34.2 g/dL (ref 30.0–36.0)
MCV: 96.4 fl (ref 78.0–100.0)
Monocytes Absolute: 0.5 10*3/uL (ref 0.1–1.0)
Monocytes Relative: 7.7 % (ref 3.0–12.0)
Neutro Abs: 3.6 10*3/uL (ref 1.4–7.7)
Neutrophils Relative %: 51.4 % (ref 43.0–77.0)
Platelets: 214 10*3/uL (ref 150.0–400.0)
RBC: 4.26 Mil/uL (ref 4.22–5.81)
RDW: 13.8 % (ref 11.5–15.5)
WBC: 7.1 10*3/uL (ref 4.0–10.5)

## 2019-10-24 LAB — PSA: PSA: 0 ng/mL — ABNORMAL LOW (ref 0.10–4.00)

## 2019-10-24 LAB — HEMOGLOBIN A1C: Hgb A1c MFr Bld: 7 % — ABNORMAL HIGH (ref 4.6–6.5)

## 2019-10-24 LAB — T3, FREE: T3, Free: 3.4 pg/mL (ref 2.3–4.2)

## 2019-10-24 LAB — TSH: TSH: 1.69 u[IU]/mL (ref 0.35–4.50)

## 2019-10-24 MED ORDER — GLIPIZIDE 10 MG PO TABS: 10.0000 mg | ORAL_TABLET | Freq: Two times a day (BID) | ORAL | 3 refills | Status: DC

## 2019-10-24 MED ORDER — TAMSULOSIN HCL 0.4 MG PO CAPS: 0.8000 mg | ORAL_CAPSULE | Freq: Every day | ORAL | 3 refills | Status: DC

## 2019-10-24 MED ORDER — ESOMEPRAZOLE MAGNESIUM 20 MG PO CPDR: 20.0000 mg | DELAYED_RELEASE_CAPSULE | Freq: Every day | ORAL | 3 refills | Status: DC

## 2019-10-24 MED ORDER — SYNTHROID 137 MCG PO TABS: 137.0000 ug | ORAL_TABLET | Freq: Every day | ORAL | 3 refills | Status: DC

## 2019-10-24 MED ORDER — LOSARTAN POTASSIUM 100 MG PO TABS: 100.0000 mg | ORAL_TABLET | Freq: Every day | ORAL | 3 refills | Status: DC

## 2019-10-24 NOTE — Patient Instructions (Addendum)
Health Maintenance Due  Topic Date Due  . FOOT EXAM  09/19/1952  . TETANUS/TDAP  09/19/1961  . HEMOGLOBIN A1C  04/18/2019  . OPHTHALMOLOGY EXAM  05/18/2019  . INFLUENZA VACCINE  07/01/2019    Depression screen [facility] 2/9 10/24/2019 10/17/2018 08/30/2018  Decreased Interest 0 0 0  Down, Depressed, Hopeless 0 0 0  PHQ - 2 Score 0 0 0

## 2019-10-24 NOTE — Progress Notes (Signed)
   Subjective:    Patient ID: Troy Howard, male    DOB: [birth year], 78 y.o.   MRN: [id number]  HPI Here for a well exam. He feels great. He still works and he enjoys Office manager. He tried Flomax 0.4 mg qhs and did not see much relief. He still has to get up to urinate 2-3 times a night.    Review of Systems  Constitutional: Negative.   HENT: Negative.   Eyes: Negative.   Respiratory: Negative.   Cardiovascular: Negative.   Gastrointestinal: Negative.   Genitourinary: Negative.   Musculoskeletal: Negative.   Skin: Negative.   Neurological: Negative.   Psychiatric/Behavioral: Negative.        Objective:   Physical Exam Constitutional:      General: He is not in acute distress.    Appearance: He is well-developed. He is not diaphoretic.  HENT:     Head: Normocephalic and atraumatic.     Right Ear: External ear normal.     Left Ear: External ear normal.     Nose: Nose normal.     Mouth/Throat:     Pharynx: No oropharyngeal exudate.  Eyes:     General: No scleral icterus.       Right eye: No discharge.        Left eye: No discharge.     Conjunctiva/sclera: Conjunctivae normal.     Pupils: Pupils are equal, round, and reactive to light.  Neck:     Musculoskeletal: Neck supple.     Thyroid: No thyromegaly.     Vascular: No JVD.     Trachea: No tracheal deviation.  Cardiovascular:     Rate and Rhythm: Normal rate and regular rhythm.     Heart sounds: Normal heart sounds. No murmur. No friction rub. No gallop.   Pulmonary:     Effort: Pulmonary effort is normal. No respiratory distress.     Breath sounds: Normal breath sounds. No wheezing or rales.  Chest:     Chest wall: No tenderness.  Abdominal:     General: Bowel sounds are normal. There is no distension.     Palpations: Abdomen is soft. There is no mass.     Tenderness: There is no abdominal tenderness. There is no guarding or rebound.  Genitourinary:    Penis: Normal. No tenderness.      Scrotum/Testes:  Normal.  Musculoskeletal: Normal range of motion.        General: No tenderness.  Lymphadenopathy:     Cervical: No cervical adenopathy.  Skin:    General: Skin is warm and dry.     Coloration: Skin is not pale.     Findings: No erythema or rash.  Neurological:     Mental Status: He is alert and oriented to person, place, and time.     Cranial Nerves: No cranial nerve deficit.     Motor: No abnormal muscle tone.     Coordination: Coordination normal.     Deep Tendon Reflexes: Reflexes are normal and symmetric. Reflexes normal.  Psychiatric:        Behavior: Behavior normal.        Thought Content: Thought content normal.        Judgment: Judgment normal.           Assessment & Plan:  Well exam. We discussed diet and exercise. Get fasting labs. For the nocturia, he will increase the Flomax to 0.8 mg qhs. [name], MD

## 2019-10-25 LAB — LDL CHOLESTEROL, DIRECT: Direct LDL: 65 mg/dL

## 2019-11-07 ENCOUNTER — Other Ambulatory Visit: Payer: Self-pay

## 2019-11-07 ENCOUNTER — Inpatient Hospital Stay: Payer: PPO | Attending: Hematology

## 2019-11-07 DIAGNOSIS — D472 Monoclonal gammopathy: Secondary | ICD-10-CM | POA: Insufficient documentation

## 2019-11-07 LAB — CMP (CANCER CENTER ONLY)
ALT: 22 U/L (ref 0–44)
AST: 16 U/L (ref 15–41)
Albumin: 4 g/dL (ref 3.5–5.0)
Alkaline Phosphatase: 64 U/L (ref 38–126)
Anion gap: 8 (ref 5–15)
BUN: 28 mg/dL — ABNORMAL HIGH (ref 8–23)
CO2: 28 mmol/L (ref 22–32)
Calcium: 9.2 mg/dL (ref 8.9–10.3)
Chloride: 103 mmol/L (ref 98–111)
Creatinine: 1.61 mg/dL — ABNORMAL HIGH (ref 0.61–1.24)
GFR, Est AFR Am: 47 mL/min — ABNORMAL LOW (ref >60)
GFR, Estimated: 41 mL/min — ABNORMAL LOW (ref >60)
Glucose, Bld: 73 mg/dL (ref 70–99)
Potassium: 4.4 mmol/L (ref 3.5–5.1)
Sodium: 139 mmol/L (ref 135–145)
Total Bilirubin: 0.6 mg/dL (ref 0.3–1.2)
Total Protein: 7.6 g/dL (ref 6.5–8.1)

## 2019-11-09 LAB — MULTIPLE MYELOMA PANEL, SERUM
Albumin SerPl Elph-Mcnc: 4 g/dL (ref 2.9–4.4)
Albumin/Glob SerPl: 1.3 (ref 0.7–1.7)
Alpha 1: 0.2 g/dL (ref 0.0–0.4)
Alpha2 Glob SerPl Elph-Mcnc: 0.8 g/dL (ref 0.4–1.0)
B-Globulin SerPl Elph-Mcnc: 0.9 g/dL (ref 0.7–1.3)
Gamma Glob SerPl Elph-Mcnc: 1.2 g/dL (ref 0.4–1.8)
Globulin, Total: 3.1 g/dL (ref 2.2–3.9)
IgA: 80 mg/dL (ref 61–437)
IgG (Immunoglobin G), Serum: 1306 mg/dL (ref 603–1613)
IgM (Immunoglobulin M), Srm: 44 mg/dL (ref 15–143)
M Protein SerPl Elph-Mcnc: 0.9 g/dL — ABNORMAL HIGH
Total Protein ELP: 7.1 g/dL (ref 6.0–8.5)

## 2019-11-14 ENCOUNTER — Other Ambulatory Visit: Payer: PPO

## 2019-11-14 ENCOUNTER — Telehealth: Payer: Self-pay | Admitting: Hematology

## 2019-11-14 ENCOUNTER — Inpatient Hospital Stay (HOSPITAL_BASED_OUTPATIENT_CLINIC_OR_DEPARTMENT_OTHER): Payer: PPO | Admitting: Hematology

## 2019-11-14 ENCOUNTER — Other Ambulatory Visit: Payer: Self-pay

## 2019-11-14 VITALS — BP 136/64 | HR 80 | Temp 98.0°F | Resp 18 | Ht 69.75 in | Wt 226.1 lb

## 2019-11-14 DIAGNOSIS — D472 Monoclonal gammopathy: Secondary | ICD-10-CM

## 2019-11-14 NOTE — Telephone Encounter (Signed)
Scheduled appte per 12/15 los.  Printed calendar and avs

## 2019-11-14 NOTE — Progress Notes (Signed)
HEMATOLOGY/ONCOLOGY CLINIC NOTE  Date of Service: 11/14/2019  Patient Care Team: Troy Morale, MD as PCP - General (Family Medicine)  CHIEF COMPLAINTS/PURPOSE OF CONSULTATION:  Positive M-Spike   HISTORY OF PRESENTING ILLNESS:   Troy Howard 78 y.o. male is a here because of positive M-spike. The patient was referred by nephrologist Dr. Hollie Howard. The patient presents to the clinic today accompanied by his wife.  He is being seen by nephrologist who pursued further work up to why his Creatinine has increased. Upon workup his M-protein was found to be 0.8g/dl.  He has had HTN and DM for many years that can affects his Kidney function and was thought to be the likely cause of his CKD.   Today his wife notes he has had muscle pain, muscle mass loss and fatigue.   He has HTN, DM, GERD, Hypothyroid, Hyperlipidemia. He was recently taken off statins, meloxicam, ASA and metformin. He takes Nexium for her GERD and has been on for many years.   On review of symptoms, pt notes purposeful weight loss, muscle loss and muscle pain. He also has leg pain from b/l knees to anterior shin.  Interval History:  Troy Howard returns today regarding his M-spike. We are joined today by his wife, Troy Howard. The patient's last visit with [place] was on 11/15/2018. The pt reports that he is doing well overall.  The pt reports that he has been feeling good and denies any significant changes. He and his wife have been staying safe during the pandemic. Pt has been eating well, working 5 days per week, and playing golf. He has no limiting symptoms at this time.   Lab results (11/07/19) of CMP is as follows: all values are WNL except for BUN at 28, Creatinine at 1.61, GFR Est Non Af Am at 41. 11/07/2019 MMP is as follows: all values are WNL except for M Protein at 0.9.  10/24/2019 CBC w/diff shows all values are WNL  On review of systems, pt reports eating well and denies new bone pains, fatigue,  abdominal pain, leg swelling and any other symptoms.   MEDICAL HISTORY:  Past Medical History:  Diagnosis Date  . Arthritis    back  . Cancer [facility])    prostate, sees Dr. Raynelle Bring   . Diabetes mellitus   . Erectile dysfunction   . GERD (gastroesophageal reflux disease)   . Hyperlipidemia   . Hypertension   . Migraines   . Peptic ulcer     SURGICAL HISTORY: Past Surgical History:  Procedure Laterality Date  . APPENDECTOMY  1950  . COLONOSCOPY  11/14/2015   per Dr. Fuller Plan, adenomatous polyps, repeat in 5 yrs   . HERNIA REPAIR     umbilical   . MENISCUS REPAIR Left   . PROSTATE SURGERY  2009   radical prostatectomy  . SPINE SURGERY     L4-L5 fusion per Dr. Glenna Fellows   . TONSILLECTOMY AND ADENOIDECTOMY  1954    SOCIAL HISTORY: Social History   Socioeconomic History  . Marital status: Married    Spouse name: Not on file  . Number of children: Not on file  . Years of education: Not on file  . Highest education level: Not on file  Occupational History  . Not on file  Tobacco Use  . Smoking status: Never Smoker  . Smokeless tobacco: Never Used  Substance and Sexual Activity  . Alcohol use: Yes    Alcohol/week: 0.0 standard drinks  Comment: rare  . Drug use: No  . Sexual activity: Not on file  Other Topics Concern  . Not on file  Social History Narrative  . Not on file   Social Determinants of Health   Financial Resource Strain:   . Difficulty of Paying Living Expenses: Not on file  Food Insecurity:   . Worried About Charity fundraiser in the Last Year: Not on file  . Ran Out of Food in the Last Year: Not on file  Transportation Needs:   . Lack of Transportation (Medical): Not on file  . Lack of Transportation (Non-Medical): Not on file  Physical Activity:   . Days of Exercise per Week: Not on file  . Minutes of Exercise per Session: Not on file  Stress:   . Feeling of Stress : Not on file  Social Connections:   . Frequency of Communication with  Friends and Family: Not on file  . Frequency of Social Gatherings with Friends and Family: Not on file  . Attends Religious Services: Not on file  . Active Member of Clubs or Organizations: Not on file  . Attends Archivist Meetings: Not on file  . Marital Status: Not on file  Intimate Partner Violence:   . Fear of Current or Ex-Partner: Not on file  . Emotionally Abused: Not on file  . Physically Abused: Not on file  . Sexually Abused: Not on file    FAMILY HISTORY: Family History  Problem Relation Age of Onset  . Hypertension Other   . Cancer Other   . Colon cancer Neg Hx     ALLERGIES:  has No Known Allergies.  MEDICATIONS:  Current Outpatient Medications  Medication Sig Dispense Refill  . Cholecalciferol (VITAMIN D3) 2000 units capsule Take by mouth.    . Cyanocobalamin (VITAMIN B 12 PO) Take by mouth.    . esomeprazole (NEXIUM) 20 MG capsule Take 1 capsule (20 mg total) by mouth daily at 12 noon. (Patient taking differently: Take 40 mg by mouth daily at 12 noon. ) 90 capsule 3  . FOLIC ACID PO Take by mouth.    Marland Kitchen glipiZIDE (GLUCOTROL) 10 MG tablet Take 1 tablet (10 mg total) by mouth 2 (two) times daily before a meal. 180 tablet 3  . losartan (COZAAR) 100 MG tablet Take 1 tablet (100 mg total) by mouth daily. 90 tablet 3  . Omega-3 Fatty Acids (FISH OIL) 1000 MG CAPS Take by mouth.    Glory Rosebush Delica Lancets 32I MISC USE ONCE A DAY WHEN CHECKING BLOOD SUGAR 100 each 3  . ONETOUCH ULTRA test strip USE ONCE A DAY TO TEST BLOOD SUGAR 100 strip 3  . SYNTHROID 137 MCG tablet Take 1 tablet (137 mcg total) by mouth daily before breakfast. 90 tablet 3  . tamsulosin (FLOMAX) 0.4 MG CAPS capsule Take 2 capsules (0.8 mg total) by mouth daily. 180 capsule 3   No current facility-administered medications for this visit.    REVIEW OF SYSTEMS:   A 10+ POINT REVIEW OF SYSTEMS WAS OBTAINED including neurology, dermatology, psychiatry, cardiac, respiratory, lymph,  extremities, GI, GU, Musculoskeletal, constitutional, breasts, reproductive, HEENT.  All pertinent positives are noted in the HPI.  All others are negative.   PHYSICAL EXAMINATION: ECOG PERFORMANCE STATUS: 1 - Symptomatic but completely ambulatory  . Vitals:   11/14/19 1531  BP: 136/64  Pulse: 80  Resp: 18  Temp: 98 F (36.7 C)  SpO2: 99%   Filed Weights   11/14/19  1531  Weight: 226 lb 1.6 oz (102.6 kg)   .Body mass index is 32.67 kg/m.  GENERAL:alert, in no acute distress and comfortable SKIN: no acute rashes, no significant lesions EYES: conjunctiva are pink and non-injected, sclera anicteric OROPHARYNX: MMM, no exudates, no oropharyngeal erythema or ulceration NECK: supple, no JVD LYMPH:  no palpable lymphadenopathy in the cervical, axillary or inguinal regions LUNGS: clear to auscultation b/l with normal respiratory effort HEART: regular rate & rhythm ABDOMEN:  normoactive bowel sounds , non tender, not distended. No palpable hepatosplenomegaly.  Extremity: no pedal edema PSYCH: alert & oriented x 3 with fluent speech NEURO: no focal motor/sensory deficits  LABORATORY DATA:  I have reviewed the data as listed  . CBC Latest Ref Rng & Units 10/24/2019 11/08/2018 10/18/2018  WBC 4.0 - 10.5 K/uL 7.1 6.3 9.2  Hemoglobin 13.0 - 17.0 g/dL 14.0 14.1 15.1  Hematocrit 39.0 - 52.0 % 41.1 41.5 43.6  Platelets 150.0 - 400.0 K/uL 214.0 182 216.0   . CBC    Component Value Date/Time   WBC 7.1 10/24/2019 1419   RBC 4.26 10/24/2019 1419   HGB 14.0 10/24/2019 1419   HCT 41.1 10/24/2019 1419   PLT 214.0 10/24/2019 1419   MCV 96.4 10/24/2019 1419   MCH 32.4 11/08/2018 1119   MCHC 34.2 10/24/2019 1419   RDW 13.8 10/24/2019 1419   LYMPHSABS 2.7 10/24/2019 1419   MONOABS 0.5 10/24/2019 1419   EOSABS 0.2 10/24/2019 1419   BASOSABS 0.0 10/24/2019 1419    . CMP Latest Ref Rng & Units 11/07/2019 10/24/2019 11/08/2018  Glucose 70 - 99 mg/dL 73 74 119(H)  BUN 8 - 23 mg/dL  28(H) 28(H) 28(H)  Creatinine 0.61 - 1.24 mg/dL 1.61(H) 1.45 1.47(H)  Sodium 135 - 145 mmol/L 139 134(L) 138  Potassium 3.5 - 5.1 mmol/L 4.4 4.5 4.2  Chloride 98 - 111 mmol/L 103 99 104  CO2 22 - 32 mmol/L '28 28 27  '$ Calcium 8.9 - 10.3 mg/dL 9.2 9.3 9.0  Total Protein 6.5 - 8.1 g/dL 7.6 7.3 7.3  Total Bilirubin 0.3 - 1.2 mg/dL 0.6 0.9 0.5  Alkaline Phos 38 - 126 U/L 64 68 72  AST 15 - 41 U/L 16 15 11(L)  ALT 0 - 44 U/L '22 23 14   '$ Component     Latest Ref Rng & Units 05/04/2018  Iron     42 - 163 ug/dL 75  TIBC     202 - 409 ug/dL 255  Saturation Ratios     42 - 163 % 29 (L)  UIBC     ug/dL 180  LDH     125 - 245 U/L 143  Sed Rate     0 - 16 mm/hr 20 (H)  Ferritin     22 - 316 ng/mL 167  Intrinsic Factor     0.0 - 1.1 AU/mL 1.0  Parietal Cell Antibody-IgG     0.0 - 20.0 Units 1.9   Component     Latest Ref Rng & Units 05/09/2018  Total Protein, Urine-UPE24     Not Estab. mg/dL 7.6  Total Protein, Urine-Ur/day     30 - 150 mg/24 hr 148  ALBUMIN, U     % 24.2  ALPHA 1 URINE     % 3.9  Alpha 2, Urine     % 12.7  % BETA, Urine     % 26.6  GAMMA GLOBULIN URINE     % 32.6  Free Kappa Lt Chains,Ur  1.35 - 24.19 mg/L 82.60 (H)  Free Lambda Lt Chains,Ur     0.24 - 6.66 mg/L 16.80 (H)  Free Kappa/Lambda Ratio     2.04 - 10.37 4.92  Immunofixation Result, Urine      Comment  Total Volume      1,950  M-SPIKE %, Urine     Not Observed % Not Observed  NOTE:      Comment      OUTSIDE LABS 04/21/18       RADIOGRAPHIC STUDIES: I have personally reviewed the radiological images as listed and agreed with the findings in the report. No results found.  ASSESSMENT & PLAN:   TYREON FRIGON is a 78 y.o. caucasian male with    1. IgG Lambda monoclonal paraproteinemia  He has a M-spike of 0.8g/dl and normal SFLC and ratio. No anemia - nl Hgb No focal bone pains --- Skeletal survey - shows no  No hypercalcemia. 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  05/10/18 Metastatic Bone Survey revealed no lytic lesions on skeletal survey to suggest multiple myeloma.   PLAN:  -Discussed pt labwork, 11/07/19; CMP is as follows: all values are WNL except for BUN at 28, Creatinine at 1.61, GFR Est Non Af Am at 76. -Discussed 11/07/2019 M Protein has remained stable at 0.9 g/dL -Discussed 10/24/2019 CBC w/diff shows all values are WNL -Discussed that the patient's clinical and lab presentation are most consistent with MGUS and does not reveal an overt concern for multiple myeloma at this time  -Advised pt of 1-2% per year risk of transformation from MGUS to an active myeloma -Discussed CRAB criteria: No anemia, no hypercalcemia, renal dysfunction that is otherwise explained (Diabetes/HTN), no evidence or concern of bone tumors -No indication for a BM Bx at this time  -Discussed symptoms to watch out for: new or unexplained fatigue, new or specific bone pain, new anemia, worsening renal function  -Continue optimizing management and care of Diabetes and Hypertension -Will see back in 12 months with labs 1 week prior    2. 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 Hollie Howard  -mild non nephrotic proteinuria on 24h UPEP-Continue follow up with Dr Troy Howard for CKD    3. Vitamin B12 deficient  Antibody testing done - no evidence of pernicious anemia. No evidence of pernicious anemia based on neg IF and parietal cell ab -He has been actively losing weight by eating healthy with no diet restrictions.  -I discussed the possible cause of this deficiency such as long term use of Nexium and Metformin. He is no longer on Metformin.  -continue B12 replacement per PCP  4.  Past Medical History:  Diagnosis Date  . Arthritis    back  . Cancer [facility])    prostate, sees Dr. Raynelle Bring   .  Diabetes mellitus   . Erectile dysfunction   . GERD (gastroesophageal reflux disease)   . Hyperlipidemia   . Hypertension   . Migraines   . Peptic ulcer     FOLLOW UP: RTC with Dr Irene Limbo with labs in 12 months. Plz schedule labs 1 week prior to clinic visit   The total time spent in the appt was 15 minutes and more than 50% was on counseling and direct patient cares.  All of  the patient's questions were answered with apparent satisfaction. The patient knows to call the clinic with any problems, questions or concerns.   Sullivan Lone MD [place] AAHIVMS [facility] Hematology/Oncology Physician [facility]  (Office):       [phone] (Work cell):  [phone] (Fax):           [phone]  11/14/2019 4:22 PM  I, Yevette Edwards, am acting as a scribe for Dr. Sullivan Lone.   .I have reviewed the above documentation for accuracy and completeness, and I agree with the above. [name] MD

## 2019-12-31 ENCOUNTER — Ambulatory Visit: Payer: PPO

## 2020-01-07 ENCOUNTER — Ambulatory Visit: Payer: PPO

## 2020-01-11 ENCOUNTER — Ambulatory Visit: Payer: PPO

## 2020-01-25 ENCOUNTER — Telehealth: Payer: Self-pay | Admitting: Family Medicine

## 2020-01-25 NOTE — Chronic Care Management (AMB) (Signed)
  Chronic Care Management   Note  01/25/2020 Name: Troy Howard MRN: [id number] DOB: [birth year]  Troy Howard is a 79 y.o. year old male who is a primary care patient of Laurey Morale, MD. I reached out to [name] by phone today in response to a referral sent by [name]'s PCP, Laurey Morale, MD.   [name] was given information about Chronic Care Management services today including:  1. CCM service includes personalized support from designated clinical staff supervised by his physician, including individualized plan of care and coordination with other care providers 2. 24/7 contact phone numbers for assistance for urgent and routine care needs. 3. Service will only be billed when office clinical staff spend 20 minutes or more in a month to coordinate care. 4. Only one practitioner may furnish and bill the service in a calendar month. 5. The patient may stop CCM services at any time (effective at the end of the month) by phone call to the office staff. 6. The patient will be responsible for cost sharing (co-pay) of up to 20% of the service fee (after annual deductible is met).  Patient agreed to services and verbal consent obtained.   Follow up plan:   Raynicia Dukes [employer] Scheduler

## 2020-01-29 ENCOUNTER — Telehealth: Payer: Self-pay | Admitting: Family Medicine

## 2020-01-29 MED ORDER — ESOMEPRAZOLE MAGNESIUM 40 MG PO CPDR: 40.0000 mg | DELAYED_RELEASE_CAPSULE | Freq: Every day | ORAL | 0 refills | Status: DC

## 2020-01-29 NOTE — Telephone Encounter (Signed)
Spoke with the patients pharmacy. They asked that we send in a new Rx for the 40mg . Rx has been sent.

## 2020-01-29 NOTE — Telephone Encounter (Signed)
[facility] pharmacy states that the Esomeprazole filled for the 20 mg but last time it was filled 40 mg. They would like clarity on if it should be 20mg  or 40mg ?  Phone: [phone]

## 2020-01-29 NOTE — Telephone Encounter (Signed)
Should be 40 mg

## 2020-01-29 NOTE — Telephone Encounter (Signed)
Please advise 

## 2020-02-06 ENCOUNTER — Telehealth: Payer: Self-pay

## 2020-02-06 DIAGNOSIS — E119 Type 2 diabetes mellitus without complications: Secondary | ICD-10-CM

## 2020-02-06 DIAGNOSIS — I10 Essential (primary) hypertension: Secondary | ICD-10-CM

## 2020-02-06 NOTE — Telephone Encounter (Signed)
Done

## 2020-02-06 NOTE — Telephone Encounter (Signed)
I am requesting an ambulatory referral to CCM be placed for this patient. Referral will need to have 2 current diagnosis attached to it. Thank you!  

## 2020-02-07 ENCOUNTER — Ambulatory Visit: Payer: PPO

## 2020-02-07 ENCOUNTER — Other Ambulatory Visit: Payer: Self-pay

## 2020-02-07 DIAGNOSIS — I10 Essential (primary) hypertension: Secondary | ICD-10-CM

## 2020-02-07 DIAGNOSIS — E119 Type 2 diabetes mellitus without complications: Secondary | ICD-10-CM

## 2020-02-07 NOTE — Chronic Care Management (AMB) (Signed)
Chronic Care Management Pharmacy  Name: Troy Howard  MRN: [id number] DOB: 1940-12-03  Initial Questions: 1. Have you seen any other providers since your last visit? NA 2. Any changes in your medicines or health? No   Chief Complaint/ HPI  [name],  79 y.o. , male presents for their Initial CCM visit with the clinical pharmacist via telephone due to COVID-19 Pandemic.  PCP : Laurey Morale, MD  Their chronic conditions include: HTN, Migraine, GERD, DM, Hypothyroidism, Kidney failure, Nocturia, HLD.   Office Visits: 10/24/2019- Patient presented in office to Dr. [name], MD. Patient notes no relief from Flomax 0.'4mg'$  at bedtime. Patient to increase to Flomax 0.'8mg'$  at bedtime.   Consult Visit: 11/14/2019- Oncology- Patient presented to Dr. [name], MD for monoclonal paraproteinemia (referred from nephrologist, Dr. Hollie Salk). M protein: 0.8 g/dl. Recently off statin, meloxicam, ASA, and metformin. Labwork results discussed with patient. CMP all WNL, except BUN at 28 and creatinine 1.61, GFR 41. No over concern for multiple myeloma. Findings consistent with MGUS. Patient to return in 12 months with labs 1 week prior.   08/29/2019- Dermatology- Patient presented to Dr. Danella Sensing for history of malignant melanoma of skin, seborrheic keratosis.   05/31/2019- Optometry- Patient presented to [facility] for astigmatism.   04/12/2019- Nephrology- Patient presented to Dr. Madelon Lips.   Medications: Outpatient Encounter Medications as of 02/07/2020  Medication Sig  . aspirin-acetaminophen-caffeine (EXCEDRIN MIGRAINE) 250-250-65 MG tablet Take 1 tablet by mouth every 8 (eight) hours as needed for migraine.  . CHELATED ZINC PO Take 1 capsule by mouth daily.  . Cholecalciferol (VITAMIN D3) 2000 units capsule Take 2,000 Units by mouth daily.   . Cyanocobalamin (VITAMIN B 12 PO) Take 2,500 mcg by mouth daily.   Marland Kitchen esomeprazole (NEXIUM) 40 MG capsule Take 1  capsule (40 mg total) by mouth daily.  Marland Kitchen FOLIC ACID PO Take 476 mcg by mouth daily.   Marland Kitchen glipiZIDE (GLUCOTROL) 10 MG tablet Take 1 tablet (10 mg total) by mouth 2 (two) times daily before a meal.  . GNP GARLIC EXTRACT PO Take 546 mg by mouth daily.  . Levomefolate Glucosamine (METHYLFOLATE PO) Take 1 tablet by mouth daily.  Marland Kitchen losartan (COZAAR) 100 MG tablet Take 1 tablet (100 mg total) by mouth daily.  . Omega-3 Fatty Acids (FISH OIL) 1000 MG CAPS Take by mouth daily.   Glory Rosebush Delica Lancets 50P MISC USE ONCE A DAY WHEN CHECKING BLOOD SUGAR  . ONETOUCH ULTRA test strip USE ONCE A DAY TO TEST BLOOD SUGAR  . pyridOXINE (VITAMIN B-6) 100 MG tablet Take 100 mg by mouth daily.  Marland Kitchen SYNTHROID 137 MCG tablet Take 1 tablet (137 mcg total) by mouth daily before breakfast.  . tamsulosin (FLOMAX) 0.4 MG CAPS capsule Take 2 capsules (0.8 mg total) by mouth daily.   No facility-administered encounter medications on file as of 02/07/2020.   Current Diagnosis/Assessment:  Goals Addressed            This Visit's Progress   . Pharmacy Care Plan       CARE PLAN ENTRY  Current Barriers:  . Chronic Disease Management support, education, and care coordination needs related to HTN, HLD, DMII, CKD, and Migraines, GERD, Hypothyroidism, Nocturia  Pharmacist Clinical Goal(s):  Marland Kitchen Diabetes:  Marland Kitchen Maintain A1c < 7.0% . Maintain up to date on diabetes preventative health (eye exams every 1 to 2 years and foot exams at least once yearly).  . Blood pressure:  .  Maintain blood pressure within goal of your provider (130/80 or 140/90).  . Maintain low salt diet.  . High cholesterol:  . Cholesterol goals: Total Cholesterol goal under 200, Triglycerides goal under 150, HDL goal above 40 (men) or above 50 (women), LDL goal under 70.  Marland Kitchen Hypothyroidism:  . Maintain TSH between 0.45 to 4.5uIU/ml. Marland Kitchen Heart burn: Minimize reflux symptoms.  . Nighttime urination . Minimize symptoms.  . Vitamin D deficiency:  . Maintain  Vitamin D-25 level at or above 30.  . Migraines: Continue to see improvement in pain level.   Interventions: . Comprehensive medication review performed. . Diabetes . Discussed importance of diabetes preventative health exams. . Obtain diabetic eye exam every 1 to 2 years.  . Obtain at least once yearly foot exams. . Continue: glipizide '10mg'$ , 1 tablet twice daily before meals  . Blood pressure:  . Discussed need to continue checking blood pressure at home.  . Discussed diet modifications. DASH diet:  following a diet emphasizing fruits and vegetables and low-fat dairy products along with whole grains, fish, poultry, and nuts. Reducing red meats and sugars.  . Exercising . Reducing the amount of salt intake to '1500mg'$ /per day.  . Recommend using a salt substitute to replace your salt if you need flavor.    . Getting enough potassium in your diet equaling 3500-'5000mg'$ /day.  This helps to regulate BP by balancing out the effects of salt.   . Weight reduction- We discussed losing 5-10% of body weight . Continue: losartan '100mg'$ , 1 tablet daily . High cholesterol . How to reduce cholesterol through diet/weight management and physical activity.    . We discussed how a diet high in plant sterols (fruits/vegetables/nuts/whole grains/legumes) may reduce your cholesterol.  Encouraged increasing fiber to a daily intake of 10-25g/day  . Continue: diet modifications and physical activities.  . Hypothyroidism . Continue: levothyroxine 146mg, 1 tablet once daily before breakfast  . GERD/ heart burn/ acid reflux: . Discussed non-pharmacological interventions for acid reflux. Take measures to prevent acid reflux, such as avoiding spicy foods, avoiding caffeine, avoid laying down a few hours after eating, and raising the head of the bed. . Continue: esomeprazole '40mg'$ , 1 capsule daily (around noon time).  . Nighttime urination:  . Continue tamsulosin 0.'4mg'$ , 2 capsules daily (takes 30 minutes following same  meal everyday).  . Chronic kidney disease . We discussed: maintaining proper hydration. Avoiding NSAID's . Vitamin D deficiency:  . Continue: Vitamin D3, 2000 units daily . Vitamin B12 deficiency:  . Continue: Vitamin B12 10028m daily  . Migraines: . Continue: Excedrin Migraine- takes as needed for occasional migraine  Patient Self Care Activities:  . Self administers medications as prescribed and Calls provider office for new concerns or questions . Continue current medications as directed by providers.  . Continue following up with specialists. . Continue at home blood pressure readings. . Continue checking blood sugars.  . Continue working on health habits (diet/ exercise).  Initial goal documentation       Diabetes  Recent Relevant Labs: Lab Results  Component Value Date/Time   HGBA1C 7.0 (H) 10/24/2019 02:19 PM   HGBA1C 7.0 (H) 10/18/2018 07:42 AM   MICROALBUR 7.3 (H) 08/14/2014 10:11 AM   MICROALBUR 1.1 03/22/2012 09:32 AM    Checking BG: Weekly  Recent FBG Readings: patient unable to state current readings; "never high"   Patient has failed these meds in past: metformin (kidney function)  Patient is currently controlled on the following medications:  - glipizide '10mg'$ ,  1 tablet twice daily before meals   Last diabetic Eye exam:  Lab Results  Component Value Date/Time   HMDIABEYEEXA No Retinopathy 09/16/2016 09:15 AM  - goes to optometrist twice a year [facility]).    Last diabetic Foot exam: No results found for: HMDIABFOOTEX  - patient states he obtains them at each wellness visits with Dr. Sarajane Jews.  We discussed: diet and exercise extensively.   Preventative diabetic health exams.   Maintain a low carbohydrate diet, eating 45 grams of carbohydrates per meal (3 servings of carbohydrates per meal), 15 grams of carbohydrates per snack (1 serving of carbohydrate per snack).   Plan Continue current medications.    Hypertension   BP today is:   <140/90  Office blood pressures are  BP Readings from Last 3 Encounters:  11/14/19 136/64  10/24/19 120/60  01/17/19 120/60   Patient has failed these meds in the past: Benicar  Patient checks BP at home 1-2x per week Patient home BP readings are ranging: patient states BP is within normal limits (SBP <140).   Patient is controlled on:  - losartan '100mg'$ , 1 tablet daily   We discussed diet and exercise extensively.   DASH diet:  following a diet emphasizing fruits and vegetables and low-fat dairy products along with whole grains, fish, poultry, and nuts. Reducing red meats and sugars.   Reducing the amount of salt intake to '1500mg'$ /per day.   Recommend using a salt substitute to replace your salt if you need flavor.     Getting enough potassium in your diet equaling 3500-'5000mg'$ /day.  This helps to regulate BP by balancing out the effects of salt.    Weight reduction- We discussed losing 5-10% of body weight.    - patients states he walks 3 miles/ day.  - he has lost 25 lbs over last year and a half and has been able to cut down on fattty foods and sweets.  Plan Denies dizziness/ lightheadedness.  Continue current medications.    Hyperlipidemia   Lipid Panel     Component Value Date/Time   CHOL 147 10/24/2019 1419   TRIG 346.0 (H) 10/24/2019 1419   HDL 19.60 (L) 10/24/2019 1419   CHOLHDL 7 10/24/2019 1419   VLDL 69.2 (H) 10/24/2019 1419   LDLCALC 84 10/22/2009 1058   LDLDIRECT 65.0 10/24/2019 1419     The ASCVD Risk score (Goff DC Jr., et al., 2013) failed to calculate for the following reasons:   The valid HDL cholesterol range is 20 to 100 mg/dL Estimated ASCVD risk score (with HDL of 20): 63.2%   Patient has failed these meds in past: Atorvastatin, fenofibrate  Patient is currently uncontrolled on the following medications:  -diet controlled  - Omega-3 Fatty-acids '1000mg'$ , 1 capsule daily   We discussed:  diet and exercise extensively  How to reduce  cholesterol through diet/weight management and physical activity.     We discussed how a diet high in plant sterols (fruits/vegetables/nuts/whole grains/legumes) may reduce your cholesterol.  Encouraged increasing fiber to a daily intake of 10-25g/day   Plan Patient states not taking atorvastatin for a long time.  Triglycerides have improved. Patient to continue working on diet/ exercise modifications.  Continue control with diet and exercise.   Hypothyroidism   TSH  Date Value Ref Range Status  10/24/2019 1.69 0.35 - 4.50 uIU/mL Final    Patient has failed these meds in past: none  Patient is currently controlled on the following medications:  - levothyroxine 179mg, 1 tablet  once daily before breakfast   Plan Continue current medications.   GERD  Patient has failed these meds in past: none Patient is currently controlled on the following medications:  - esomeprazole '40mg'$ , 1 capsule daily (around noon time).   We discussed:  non-pharmacological interventions for acid reflux. Take measures to prevent acid reflux, such as avoiding spicy foods, avoiding caffeine, avoid laying down a few hours after eating, and raising the head of the bed.  Plan Continue current medications.   Nocturia  Patient has failed these meds in past: none  Patient is currently uncontrolled on the following medications:  - tamsulosin 0.'4mg'$ , 2 capsules daily    We discussed:  administration time: 30 minutes after same meals  Plan Continue current medications  CKD, Stage II   Lab Results  Component Value Date   CREATININE 1.61 (H) 11/07/2019   CREATININE 1.45 10/24/2019   CREATININE 1.47 (H) 11/08/2018   GFR:  41 (11/07/2019)  46 (11/08/2018)   We discussed: proper hydration and avoiding NSAIDs.   Plan Kidney function stable. Continue to monitor and adjust medications as needed.    Vitamin D deficiency  Patient has failed these meds in past: none  Patient is currently managed on the  following medications:  - Vitamin D3, 2000 units daily  Vitamin D level: 23.50 (02/22/18)  Plan Continue current medications.   Vitamin B12 deficiency  Patient has failed these meds in past: none  Patient is currently managed on the following medications:  - Vitamin B12 2500 mcg daily  Vitamin B12 level: 161 (02/22/2018)   Plan Continue current medications  Migraines  Patient has failed these meds in past: none  Patient is currently controlled on the following medications:  - Excedrin Migraine- takes as needed for occasional migraine  Plan Continue current medications.   Medication Management  Patient organizes medications: patient uses pill box  Barriers: denies issues obtaining medications Adherence: no fill gaps (based on medication dispense history).   Follow up  Follow up visit with PharmD in 1 year. Will conduct general telephone calls for periodic check-ins before next visit.    Anson Crofts, PharmD Clinical Pharmacist [facility] [phone]

## 2020-02-09 NOTE — Patient Instructions (Addendum)
Visit Information  Goals Addressed            This Visit's Progress   . Pharmacy Care Plan       CARE PLAN ENTRY  Current Barriers:  . Chronic Disease Management support, education, and care coordination needs related to HTN, HLD, DMII, CKD, and Migraines, GERD, Hypothyroidism, Nocturia  Pharmacist Clinical Goal(s):  Marland Kitchen Diabetes:  Marland Kitchen Maintain A1c < 7.0% . Maintain up to date on diabetes preventative health (eye exams every 1 to 2 years and foot exams at least once yearly).  . Blood pressure:  Marland Kitchen Maintain blood pressure within goal of your provider (130/80 or 140/90).  . Maintain low salt diet.  . High cholesterol:  . Cholesterol goals: Total Cholesterol goal under 200, Triglycerides goal under 150, HDL goal above 40 (men) or above 50 (women), LDL goal under 70.  Marland Kitchen Hypothyroidism:  . Maintain TSH between 0.45 to 4.5uIU/ml. Marland Kitchen Heart burn: Minimize reflux symptoms.  . Nighttime urination . Minimize symptoms.  . Vitamin D deficiency:  . Maintain Vitamin D-25 level at or above 30.  . Migraines: Continue to see improvement in pain level.   Interventions: . Comprehensive medication review performed. . Diabetes . Discussed importance of diabetes preventative health exams. . Obtain diabetic eye exam every 1 to 2 years.  . Obtain at least once yearly foot exams. . Continue: glipizide 10mg , 1 tablet twice daily before meals  . Blood pressure:  . Discussed need to continue checking blood pressure at home.  . Discussed diet modifications. DASH diet:  following a diet emphasizing fruits and vegetables and low-fat dairy products along with whole grains, fish, poultry, and nuts. Reducing red meats and sugars.  . Exercising . Reducing the amount of salt intake to 1500mg /per day.  . Recommend using a salt substitute to replace your salt if you need flavor.    . Getting enough potassium in your diet equaling 3500-5000mg /day.  This helps to regulate BP by balancing out the effects of salt.    . Weight reduction- We discussed losing 5-10% of body weight . Continue: losartan 100mg , 1 tablet daily . High cholesterol . How to reduce cholesterol through diet/weight management and physical activity.    . We discussed how a diet high in plant sterols (fruits/vegetables/nuts/whole grains/legumes) may reduce your cholesterol.  Encouraged increasing fiber to a daily intake of 10-25g/day  . Continue: diet modifications and physical activities.  . Hypothyroidism . Continue: levothyroxine 127mcg, 1 tablet once daily before breakfast  . GERD/ heart burn/ acid reflux: . Discussed non-pharmacological interventions for acid reflux. Take measures to prevent acid reflux, such as avoiding spicy foods, avoiding caffeine, avoid laying down a few hours after eating, and raising the head of the bed. . Continue: esomeprazole 40mg , 1 capsule daily (around noon time).  . Nighttime urination:  . Continue tamsulosin 0.4mg , 2 capsules daily (takes 30 minutes following same meal everyday).  . Chronic kidney disease . We discussed: maintaining proper hydration. Avoiding NSAID's . Vitamin D deficiency:  . Continue: Vitamin D3, 2000 units daily . Vitamin B12 deficiency:  . Continue: Vitamin B12 1031mcg daily  . Migraines: . Continue: Excedrin Migraine- takes as needed for occasional migraine  Patient Self Care Activities:  . Self administers medications as prescribed and Calls provider office for new concerns or questions . Continue current medications as directed by providers.  . Continue following up with specialists. . Continue at home blood pressure readings. . Continue checking blood sugars.  . Continue working  on health habits (diet/ exercise).  Initial goal documentation        Mr. Freudenthal was given information about Chronic Care Management services today including:  1. CCM service includes personalized support from designated clinical staff supervised by his physician, including  individualized plan of care and coordination with other care providers 2. 24/7 contact phone numbers for assistance for urgent and routine care needs. 3. Standard insurance, coinsurance, copays and deductibles apply for chronic care management only during months in which we provide at least 20 minutes of these services. Most insurances cover these services at 100%, however patients may be responsible for any copay, coinsurance and/or deductible if applicable. This service may help you avoid the need for more expensive face-to-face services. 4. Only one practitioner may furnish and bill the service in a calendar month. 5. The patient may stop CCM services at any time (effective at the end of the month) by phone call to the office staff.  Patient agreed to services and verbal consent obtained.   The patient verbalized understanding of instructions provided today and agreed to receive a mailed copy of patient instruction and/or educational materials. Telephone follow up appointment with pharmacy team member scheduled for: 02/06/2021.   Anson Crofts, PharmD Clinical Pharmacist [facility] [phone]    High Triglycerides Eating Plan Triglycerides are a type of fat in the blood. High levels of triglycerides can increase your risk of heart disease and stroke. If your triglyceride levels are high, choosing the right foods can help lower your triglycerides and keep your heart healthy. Work with your health care provider or a diet and nutrition specialist (dietitian) to develop an eating plan that is right for you. What are tips for following this plan? General guidelines   Lose weight, if you are overweight. For most people, losing 5-10 lbs (2-5 kg) helps lower triglyceride levels. A weight-loss plan may include. ? 30 minutes of exercise at least 5 days a week. ? Reducing the amount of calories, sugar, and fat you eat.  Eat a wide variety of fresh fruits,  vegetables, and whole grains. These foods are high in fiber.  Eat foods that contain healthy fats, such as fatty fish, nuts, seeds, and olive oil.  Avoid foods that are high in added sugar, added salt (sodium), saturated fat, and trans fat.  Avoid low-fiber, refined carbohydrates such as white bread, crackers, noodles, and white rice.  Avoid foods with partially hydrogenated oils (trans fats), such as fried foods or stick margarine.  Limit alcohol intake to no more than 1 drink a day for nonpregnant women and 2 drinks a day for men. One drink equals 12 oz of beer, 5 oz of wine, or 1 oz of hard liquor. Your health care provider may recommend that you drink less depending on your overall health. Reading food labels  Check food labels for the amount of saturated fat. Choose foods with no or very little saturated fat.  Check food labels for the amount of trans fat. Choose foods with no trans fat.  Check food labels for the amount of cholesterol. Choose foods low in cholesterol. Ask your dietitian how much cholesterol you should have each day.  Check food labels for the amount of sodium. Choose foods with less than 140 milligrams (mg) per serving. Shopping  Buy dairy products labeled as nonfat (skim) or low-fat (1%).  Avoid buying processed or prepackaged foods. These are often high in added sugar, sodium, and fat. Cooking  Choose healthy fats when cooking, such as olive oil or canola oil.  Cook foods using lower fat methods, such as baking, broiling, boiling, or grilling.  Make your own sauces, dressings, and marinades when possible, instead of buying them. Store-bought sauces, dressings, and marinades are often high in sodium and sugar. Meal planning  Eat more home-cooked food and less restaurant, buffet, and fast food.  Eat fatty fish at least 2 times each week. Examples of fatty fish include salmon, trout, mackerel, tuna, and herring.  If you eat whole eggs, do not eat more than  3 egg yolks per week. What foods are recommended? The items listed may not be a complete list. Talk with your dietitian about what dietary choices are best for you. Grains Whole wheat or whole grain breads, crackers, cereals, and pasta. Unsweetened oatmeal. Bulgur. Barley. Quinoa. Brown rice. Whole wheat flour tortillas. Vegetables Fresh or frozen vegetables. Low-sodium canned vegetables. Fruits All fresh, canned (in natural juice), or frozen fruits. Meats and other protein foods Skinless chicken or [place]. Ground chicken or [place]. Lean cuts of pork, trimmed of fat. Fish and seafood, especially salmon, trout, and herring. Egg whites. Dried beans, peas, or lentils. Unsalted nuts or seeds. Unsalted canned beans. Natural peanut or almond butter. Dairy Low-fat dairy products. Skim or low-fat (1%) milk. Reduced fat (2%) and low-sodium cheese. Low-fat ricotta cheese. Low-fat cottage cheese. Plain, low-fat yogurt. Fats and oils Tub margarine without trans fats. Light or reduced-fat mayonnaise. Light or reduced-fat salad dressings. Avocado. Safflower, olive, sunflower, soybean, and canola oils. What foods are not recommended? The items listed may not be a complete list. Talk with your dietitian about what dietary choices are best for you. Grains White bread. White (regular) pasta. White rice. Cornbread. Bagels. Pastries. Crackers that contain trans fat. Vegetables Creamed or fried vegetables. Vegetables in a cheese sauce. Fruits Sweetened dried fruit. Canned fruit in syrup. Fruit juice. Meats and other protein foods Fatty cuts of meat. Ribs. Chicken wings. Berniece Salines. Sausage. Bologna. Salami. Chitterlings. Fatback. Hot dogs. Bratwurst. Packaged lunch meats. Dairy Whole or reduced-fat (2%) milk. Half-and-half. Cream cheese. Full-fat or sweetened yogurt. Full-fat cheese. Nondairy creamers. Whipped toppings. Processed cheese or cheese spreads. Cheese curds. Beverages Alcohol. Sweetened drinks, such as  soda, lemonade, fruit drinks, or punches. Fats and oils Butter. Stick margarine. Lard. Shortening. Ghee. Bacon fat. Tropical oils, such as coconut, palm kernel, or palm oils. Sweets and desserts Corn syrup. Sugars. Honey. Molasses. Candy. Jam and jelly. Syrup. Sweetened cereals. Cookies. Pies. Cakes. Donuts. Muffins. Ice cream. Condiments Store-bought sauces, dressings, and marinades that are high in sugar, such as ketchup and barbecue sauce. Summary  High levels of triglycerides can increase the risk of heart disease and stroke. Choosing the right foods can help lower your triglycerides.  Eat plenty of fresh fruits, vegetables, and whole grains. Choose low-fat dairy and lean meats. Eat fatty fish at least twice a week.  Avoid processed and prepackaged foods with added sugar, sodium, saturated fat, and trans fat.  If you need suggestions or have questions about what types of food are good for you, talk with your health care provider or a dietitian. This information is not intended to replace advice given to you by your health care provider. Make sure you discuss any questions you have with your health care provider. Document Revised: 10/29/2017 Document Reviewed: 01/19/2017 Elsevier Patient Education  2020 [place].  Diabetes Mellitus and Standards of Medical Care Managing diabetes (diabetes mellitus) can be complicated. Your diabetes treatment may  be managed by a team of health care providers, including:  A physician who specializes in diabetes (endocrinologist).  A nurse practitioner or physician assistant.  Nurses.  A diet and nutrition specialist (registered dietitian).  A certified diabetes educator (CDE).  An exercise specialist.  A pharmacist.  An eye doctor.  A foot specialist (podiatrist).  A dentist.  A primary care provider.  A mental health provider. Your health care providers follow guidelines to help you get the best quality of care. The following  schedule is a general guideline for your diabetes management plan. Your health care providers may give you more specific instructions. Physical exams Upon being diagnosed with diabetes mellitus, and each year after that, your health care provider will ask about your medical and family history. He or she will also do a physical exam. Your exam may include:  Measuring your height, weight, and body mass index (BMI).  Checking your blood pressure. This will be done at every routine medical visit. Your target blood pressure may vary depending on your medical conditions, your age, and other factors.  Thyroid gland exam.  Skin exam.  Screening for damage to your nerves (peripheral neuropathy). This may include checking the pulse in your legs and feet and checking the level of sensation in your hands and feet.  A complete foot exam to inspect the structure and skin of your feet, including checking for cuts, bruises, redness, blisters, sores, or other problems.  Screening for blood vessel (vascular) problems, which may include checking the pulse in your legs and feet and checking your temperature. Blood tests Depending on your treatment plan and your personal needs, you may have the following tests done:  HbA1c (hemoglobin A1c). This test provides information about blood sugar (glucose) control over the previous 2-3 months. It is used to adjust your treatment plan, if needed. This test will be done: ? At least 2 times a year, if you are meeting your treatment goals. ? 4 times a year, if you are not meeting your treatment goals or if treatment goals have changed.  Lipid testing, including total, LDL, and HDL cholesterol and triglyceride levels. ? The goal for LDL is less than 100 mg/dL (5.5 mmol/L). If you are at high risk for complications, the goal is less than 70 mg/dL (3.9 mmol/L). ? The goal for HDL is 40 mg/dL (2.2 mmol/L) or higher for men and 50 mg/dL (2.8 mmol/L) or higher for women. An HDL  cholesterol of 60 mg/dL (3.3 mmol/L) or higher gives some protection against heart disease. ? The goal for triglycerides is less than 150 mg/dL (8.3 mmol/L).  Liver function tests.  Kidney function tests.  Thyroid function tests. Dental and eye exams  Visit your dentist two times a year.  If you have type 1 diabetes, your health care provider may recommend an eye exam 3-5 years after you are diagnosed, and then once a year after your first exam. ? For children with type 1 diabetes, a health care provider may recommend an eye exam when your child is age 80 or older and has had diabetes for 3-5 years. After the first exam, your child should get an eye exam once a year.  If you have type 2 diabetes, your health care provider may recommend an eye exam as soon as you are diagnosed, and then once a year after your first exam. Immunizations   The yearly flu (influenza) vaccine is recommended for everyone 6 months or older who has diabetes.  The  pneumonia (pneumococcal) vaccine is recommended for everyone 2 years or older who has diabetes. If you are 58 or older, you may get the pneumonia vaccine as a series of two separate shots.  The hepatitis B vaccine is recommended for adults shortly after being diagnosed with diabetes.  Adults and children with diabetes should receive all other vaccines according to age-specific recommendations from the Centers for Disease Control and Prevention (CDC). Mental and emotional health Screening for symptoms of eating disorders, anxiety, and depression is recommended at the time of diagnosis and afterward as needed. If your screening shows that you have symptoms (positive screening result), you may need more evaluation and you may work with a mental health care provider. Treatment plan Your treatment plan will be reviewed at every medical visit. You and your health care provider will discuss:  How you are taking your medicines, including insulin.  Any side  effects you are experiencing.  Your blood glucose target goals.  The frequency of your blood glucose monitoring.  Lifestyle habits, such as activity level as well as tobacco, alcohol, and substance use. Diabetes self-management education Your health care provider will assess how well you are monitoring your blood glucose levels and whether you are taking your insulin correctly. He or she may refer you to:  A certified diabetes educator to manage your diabetes throughout your life, starting at diagnosis.  A registered dietitian who can create or review your personal nutrition plan.  An exercise specialist who can discuss your activity level and exercise plan. Summary  Managing diabetes (diabetes mellitus) can be complicated. Your diabetes treatment may be managed by a team of health care providers.  Your health care providers follow guidelines in order to help you get the best quality of care.  Standards of care including having regular physical exams, blood tests, blood pressure monitoring, immunizations, screening tests, and education about how to manage your diabetes.  Your health care providers may also give you more specific instructions based on your individual health. This information is not intended to replace advice given to you by your health care provider. Make sure you discuss any questions you have with your health care provider. Document Revised: 08/05/2018 Document Reviewed: 08/14/2016 Elsevier Patient Education  [place].

## 2020-07-18 DIAGNOSIS — R809 Proteinuria, unspecified: Secondary | ICD-10-CM | POA: Diagnosis not present

## 2020-07-18 DIAGNOSIS — N1831 Chronic kidney disease, stage 3a: Secondary | ICD-10-CM | POA: Diagnosis not present

## 2020-07-18 DIAGNOSIS — Z8546 Personal history of malignant neoplasm of prostate: Secondary | ICD-10-CM | POA: Diagnosis not present

## 2020-07-18 DIAGNOSIS — D472 Monoclonal gammopathy: Secondary | ICD-10-CM | POA: Diagnosis not present

## 2020-07-18 DIAGNOSIS — I129 Hypertensive chronic kidney disease with stage 1 through stage 4 chronic kidney disease, or unspecified chronic kidney disease: Secondary | ICD-10-CM | POA: Diagnosis not present

## 2020-07-18 DIAGNOSIS — E785 Hyperlipidemia, unspecified: Secondary | ICD-10-CM | POA: Diagnosis not present

## 2020-07-18 DIAGNOSIS — E1122 Type 2 diabetes mellitus with diabetic chronic kidney disease: Secondary | ICD-10-CM | POA: Diagnosis not present

## 2020-07-29 ENCOUNTER — Other Ambulatory Visit: Payer: Self-pay | Admitting: Family Medicine

## 2020-08-28 DIAGNOSIS — I700 Atherosclerosis of aorta: Secondary | ICD-10-CM | POA: Diagnosis not present

## 2020-08-28 DIAGNOSIS — M5136 Other intervertebral disc degeneration, lumbar region: Secondary | ICD-10-CM | POA: Diagnosis not present

## 2020-08-28 DIAGNOSIS — M545 Low back pain: Secondary | ICD-10-CM | POA: Diagnosis not present

## 2020-08-28 DIAGNOSIS — M961 Postlaminectomy syndrome, not elsewhere classified: Secondary | ICD-10-CM | POA: Diagnosis not present

## 2020-09-03 DIAGNOSIS — L821 Other seborrheic keratosis: Secondary | ICD-10-CM | POA: Diagnosis not present

## 2020-09-03 DIAGNOSIS — Z85820 Personal history of malignant melanoma of skin: Secondary | ICD-10-CM | POA: Diagnosis not present

## 2020-09-03 DIAGNOSIS — D1801 Hemangioma of skin and subcutaneous tissue: Secondary | ICD-10-CM | POA: Diagnosis not present

## 2020-09-03 DIAGNOSIS — L718 Other rosacea: Secondary | ICD-10-CM | POA: Diagnosis not present

## 2020-10-10 ENCOUNTER — Telehealth: Payer: Self-pay | Admitting: Pharmacist

## 2020-10-10 NOTE — Chronic Care Management (AMB) (Signed)
The patient's blood pressure assessment has been rescheduled till December.    Maia Breslow, [place] Assistant [phone]

## 2020-10-13 ENCOUNTER — Ambulatory Visit (HOSPITAL_COMMUNITY)
Admission: EM | Admit: 2020-10-13 | Discharge: 2020-10-13 | Disposition: A | Discharge status: Home / Self Care | Payer: PPO | Attending: Emergency Medicine | Admitting: Emergency Medicine

## 2020-10-13 ENCOUNTER — Other Ambulatory Visit: Payer: Self-pay

## 2020-10-13 ENCOUNTER — Encounter (HOSPITAL_COMMUNITY): Payer: Self-pay | Admitting: *Deleted

## 2020-10-13 ENCOUNTER — Ambulatory Visit (INDEPENDENT_AMBULATORY_CARE_PROVIDER_SITE_OTHER): Payer: PPO

## 2020-10-13 DIAGNOSIS — R059 Cough, unspecified: Secondary | ICD-10-CM

## 2020-10-13 DIAGNOSIS — R21 Rash and other nonspecific skin eruption: Secondary | ICD-10-CM | POA: Diagnosis not present

## 2020-10-13 DIAGNOSIS — J069 Acute upper respiratory infection, unspecified: Secondary | ICD-10-CM

## 2020-10-13 MED ORDER — AZITHROMYCIN 250 MG PO TABS: ORAL_TABLET | ORAL | 0 refills | Status: AC

## 2020-10-13 MED ORDER — ALBUTEROL SULFATE HFA 108 (90 BASE) MCG/ACT IN AERS: 1.0000 | INHALATION_SPRAY | Freq: Four times a day (QID) | RESPIRATORY_TRACT | 0 refills | Status: DC | PRN

## 2020-10-13 MED ORDER — BENZONATATE 100 MG PO CAPS: 100.0000 mg | ORAL_CAPSULE | Freq: Three times a day (TID) | ORAL | 0 refills | Status: DC

## 2020-10-13 NOTE — Discharge Instructions (Signed)
Your chest xray looks well today which is reassuring.  I have opted to provide antibiotics to see if this is helpful in any way due to the exam findings, however.  Use of inhaler as needed for wheezing or shortness of breath.  I do recommend isolation as we have not ruled out covid-19 today.  Push fluids to ensure adequate hydration and keep secretions thin.  Tylenol and/or ibuprofen as needed for pain or fevers.  Over the counter medications as needed for symptoms.  If symptoms worsen or do not improve in the next week to return to be seen or to follow up with your PCP.

## 2020-10-13 NOTE — ED Triage Notes (Signed)
C/O "hacking" cough, intermittent HA, runny nose and nasal congestion.  Denies fevers. Pt declines Covid test.

## 2020-10-13 NOTE — ED Provider Notes (Signed)
[place]    CSN: [id number] Arrival date & time: 10/13/20  1202      History   Chief Complaint Chief Complaint  Patient presents with  . Cough    HPI Troy Howard is a 79 y.o. male.   Troy Howard presents with complaints of cough, congestion, nasal drainage, sore throat, headache which started 11.9. sore throat has improved but cough seems to be worsening. Cough is productive of phlegm. Worse when he lays down flat. No leg swelling. No chest pain . No fever. No gi symptoms. Some shortness of breath related to congestion. Has been taking over the counter medications which have not been helping with his symptoms. He has received 3 covid vaccines and has gotten his annual flu vaccine. No history of asthma. Doesn't smoke. History  Of htn, dm.    ROS per HPI, negative if not otherwise mentioned.      Past Medical History:  Diagnosis Date  . Arthritis    back  . Cancer [facility])    prostate, sees Dr. Raynelle Bring   . Diabetes mellitus   . Erectile dysfunction   . GERD (gastroesophageal reflux disease)   . Hyperlipidemia   . Hypertension   . Migraines   . Peptic ulcer     Patient Active Problem List   Diagnosis Date Noted  . Dyslipidemia 02/22/2018  . Hypothyroidism 02/22/2018  . Diabetes mellitus without complication ([place]) 20/35/5974  . 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 PROSTATE CANCER, HX OF 11/13/2010  . GERD 01/17/2008  . ACUT PEPTC ULCR UNS SITE W/HEM W/O MENTION OBST 01/17/2008  . ERECTILE DYSFUNCTION 11/23/2007  . HYPERTENSION, BENIGN 11/23/2007  . PROSTATE SPECIFIC ANTIGEN, ELEVATED 11/23/2007  . MIGRAINE HEADACHE 06/13/2007    Past Surgical History:  Procedure Laterality Date  . APPENDECTOMY  1950  . COLONOSCOPY  11/14/2015   per Dr. Fuller Plan, adenomatous polyps, repeat in 5 yrs   . HERNIA REPAIR     umbilical   . MENISCUS REPAIR Left   . PROSTATE SURGERY  2009   radical prostatectomy  . SPINE SURGERY     L4-L5 fusion per Dr.  Glenna Fellows   . TONSILLECTOMY AND ADENOIDECTOMY  1954       Home Medications    Prior to Admission medications   Medication Sig Start Date End Date Taking? Authorizing Provider  CHELATED ZINC PO Take 1 capsule by mouth daily.   Yes [provider]  Cholecalciferol (VITAMIN D3) 2000 units capsule Take 2,000 Units by mouth daily.    Yes [provider]  Cyanocobalamin (VITAMIN B 12 PO) Take 2,500 mcg by mouth daily.    Yes [provider]  FOLIC ACID PO Take 163 mcg by mouth daily.    Yes [provider]  glipiZIDE (GLUCOTROL) 10 MG tablet Take 1 tablet (10 mg total) by mouth 2 (two) times daily before a meal. 10/24/19  Yes Laurey Morale, MD  GNP GARLIC EXTRACT PO Take 845 mg by mouth daily.   Yes [provider]  Levomefolate Glucosamine (METHYLFOLATE PO) Take 1 tablet by mouth daily.   Yes [provider]  losartan (COZAAR) 100 MG tablet Take 1 tablet (100 mg total) by mouth daily. 10/24/19  Yes Laurey Morale, MD  Omega-3 Fatty Acids (FISH OIL) 1000 MG CAPS Take by mouth daily.    Yes [provider]  pyridOXINE (VITAMIN B-6) 100 MG tablet Take 100 mg by mouth daily.   Yes [provider]  SYNTHROID 137 MCG tablet Take 1 tablet (137 mcg total) by mouth daily before breakfast. 10/24/19  Yes Laurey Morale, MD  albuterol [facility] HFA) 108 (90 Base) MCG/ACT inhaler Inhale 1-2 puffs into the lungs every 6 (six) hours as needed for wheezing or shortness of breath. 10/13/20   Zigmund Gottron, NP  aspirin-acetaminophen-caffeine (EXCEDRIN MIGRAINE) [phone] MG tablet Take 1 tablet by mouth every 8 (eight) hours as needed for migraine.    [provider]  azithromycin (ZITHROMAX) 250 MG tablet Take 2 tablets (500 mg total) by mouth daily for 1 day, THEN 1 tablet (250 mg total) daily for 4 days. 10/13/20 10/18/20  Zigmund Gottron, NP  benzonatate (TESSALON) 100 MG capsule Take 1 capsule (100 mg total) by mouth every 8  (eight) hours. 10/13/20   Zigmund Gottron, NP  esomeprazole (NEXIUM) 40 MG capsule TAKE 1 CAPSULE BY MOUTH EVERY DAY 07/29/20   Laurey Morale, MD  OneTouch Delica Lancets 24M MISC USE ONCE A DAY WHEN CHECKING BLOOD SUGAR 10/24/19   Laurey Morale, MD  [facility] ULTRA test strip USE ONCE A DAY TO TEST BLOOD SUGAR 10/24/19   Laurey Morale, MD  tamsulosin (FLOMAX) 0.4 MG CAPS capsule Take 2 capsules (0.8 mg total) by mouth daily. 10/24/19   Laurey Morale, MD    Family History Family History  Problem Relation Age of Onset  . Hypertension Other   . Cancer Other   . Colon cancer Neg Hx     Social History Social History   Tobacco Use  . Smoking status: Never Smoker  . Smokeless tobacco: Never Used  Vaping Use  . Vaping Use: Never used  Substance Use Topics  . Alcohol use: Not Currently    Comment: rare  . Drug use: No     Allergies   Patient has no known allergies.   Review of Systems Review of Systems   Physical Exam Triage Vital Signs ED Triage Vitals [10/13/20 1348]  Enc Vitals Group     BP (!) 173/88     Pulse Rate 68     Resp 14     Temp 97.7 F (36.5 C)     Temp Source Oral     SpO2 97 %     Weight      Height      Head Circumference      Peak Flow      Pain Score 4     Pain Loc      Pain Edu?      Excl. in [place]?    No data found.  Updated Vital Signs BP (!) 173/88 Comment: has taken HTN med this AM  Pulse 68   Temp 97.7 F (36.5 C) (Oral)   Resp 14   SpO2 97%    Physical Exam Constitutional:      Appearance: He is well-developed.  Cardiovascular:     Rate and Rhythm: Normal rate.  Pulmonary:     Effort: Pulmonary effort is normal. No tachypnea or accessory muscle usage.     Breath sounds: No decreased air movement.     Comments: Wheezing noted with deep inspiration, R>L Skin:    General: Skin is warm and dry.  Neurological:     Mental Status: He is alert and oriented to person, place, and time.      UC Treatments / Results   Labs (all labs ordered are listed, but only abnormal results are displayed) Labs Reviewed - No  data to display  EKG   Radiology No results found.  Procedures Procedures (including critical care time)  Medications Ordered in UC Medications - No data to display  Initial Impression / Assessment and Plan / UC Course  I have reviewed the triage vital signs and the nursing notes.  Pertinent labs & imaging results that were available during my care of the patient were reviewed by me and considered in my medical decision making (see chart for details).     Xray without acute findings. No hypoxia. Afebrile. No tachycardia. No peripheral edema. Wheezing on exam, inhaler provided and opted to cover with azithromycin. Patient declines covid-19 testing today, isolation encouraged. Return precautions provided. Patient verbalized understanding and agreeable to plan.    Final Clinical Impressions(s) / UC Diagnoses   Final diagnoses:  Upper respiratory tract infection, unspecified type  Cough     Discharge Instructions     Your chest xray looks well today which is reassuring.  I have opted to provide antibiotics to see if this is helpful in any way due to the exam findings, however.  Use of inhaler as needed for wheezing or shortness of breath.  I do recommend isolation as we have not ruled out covid-19 today.  Push fluids to ensure adequate hydration and keep secretions thin.  Tylenol and/or ibuprofen as needed for pain or fevers.  Over the counter medications as needed for symptoms.  If symptoms worsen or do not improve in the next week to return to be seen or to follow up with your PCP.      ED Prescriptions    Medication Sig Dispense Auth. Provider   albuterol (PROAIR HFA) 108 (90 Base) MCG/ACT inhaler Inhale 1-2 puffs into the lungs every 6 (six) hours as needed for wheezing or shortness of breath. 1 each Zigmund Gottron, NP   azithromycin (ZITHROMAX) 250 MG tablet Take 2  tablets (500 mg total) by mouth daily for 1 day, THEN 1 tablet (250 mg total) daily for 4 days. 6 tablet Augusto Gamble B, NP   benzonatate (TESSALON) 100 MG capsule Take 1 capsule (100 mg total) by mouth every 8 (eight) hours. 21 capsule Zigmund Gottron, NP     PDMP not reviewed this encounter.   Zigmund Gottron, NP 10/13/20 1542

## 2020-10-28 ENCOUNTER — Other Ambulatory Visit: Payer: Self-pay

## 2020-10-28 ENCOUNTER — Encounter: Payer: Self-pay | Admitting: Family Medicine

## 2020-10-28 ENCOUNTER — Ambulatory Visit (INDEPENDENT_AMBULATORY_CARE_PROVIDER_SITE_OTHER): Payer: PPO | Admitting: Family Medicine

## 2020-10-28 VITALS — BP 126/70 | HR 79 | Temp 97.7°F | Resp 16 | Ht 71.0 in | Wt 222.6 lb

## 2020-10-28 DIAGNOSIS — Z0000 Encounter for general adult medical examination without abnormal findings: Secondary | ICD-10-CM

## 2020-10-28 DIAGNOSIS — E119 Type 2 diabetes mellitus without complications: Secondary | ICD-10-CM

## 2020-10-28 DIAGNOSIS — E039 Hypothyroidism, unspecified: Secondary | ICD-10-CM

## 2020-10-28 MED ORDER — GLIPIZIDE 10 MG PO TABS: 10.0000 mg | ORAL_TABLET | Freq: Two times a day (BID) | ORAL | 3 refills | Status: DC

## 2020-10-28 MED ORDER — LOSARTAN POTASSIUM 100 MG PO TABS: 100.0000 mg | ORAL_TABLET | Freq: Every day | ORAL | 3 refills | Status: DC

## 2020-10-28 MED ORDER — TEMAZEPAM 30 MG PO CAPS: 30.0000 mg | ORAL_CAPSULE | Freq: Every evening | ORAL | 5 refills | Status: DC | PRN

## 2020-10-28 MED ORDER — ESOMEPRAZOLE MAGNESIUM 40 MG PO CPDR
DELAYED_RELEASE_CAPSULE | ORAL | 3 refills | Status: DC
Start: 2020-10-28 — End: 2021-10-29

## 2020-10-28 MED ORDER — SYNTHROID 137 MCG PO TABS
137.0000 ug | ORAL_TABLET | Freq: Every day | ORAL | 3 refills | Status: DC
Start: 2020-10-28 — End: 2021-10-29

## 2020-10-28 NOTE — Progress Notes (Signed)
   Subjective:    Patient ID: Troy Howard, male    DOB: 1941/10/26, 79 y.o.   MRN: [id number]  HPI Here for a well exam. His main concerns are poor sleep and having to urinate 3-4 times at night. He tried Ambien a few years ago but this was too strong for him. Flomax has not helped at all.    Review of Systems  Constitutional: Negative.   HENT: Negative.   Eyes: Negative.   Respiratory: Negative.   Cardiovascular: Negative.   Gastrointestinal: Negative.   Genitourinary: Negative.   Musculoskeletal: Negative.   Skin: Negative.   Neurological: Negative.   Psychiatric/Behavioral: Positive for sleep disturbance.       Objective:   Physical Exam Constitutional:      General: He is not in acute distress.    Appearance: He is well-developed. He is not diaphoretic.  HENT:     Head: Normocephalic and atraumatic.     Right Ear: External ear normal.     Left Ear: External ear normal.     Nose: Nose normal.     Mouth/Throat:     Pharynx: No oropharyngeal exudate.  Eyes:     General: No scleral icterus.       Right eye: No discharge.        Left eye: No discharge.     Conjunctiva/sclera: Conjunctivae normal.     Pupils: Pupils are equal, round, and reactive to light.  Neck:     Thyroid: No thyromegaly.     Vascular: No JVD.     Trachea: No tracheal deviation.  Cardiovascular:     Rate and Rhythm: Normal rate and regular rhythm.     Heart sounds: Normal heart sounds. No murmur heard.  No friction rub. No gallop.   Pulmonary:     Effort: Pulmonary effort is normal. No respiratory distress.     Breath sounds: Normal breath sounds. No wheezing or rales.  Chest:     Chest wall: No tenderness.  Abdominal:     General: Bowel sounds are normal. There is no distension.     Palpations: Abdomen is soft. There is no mass.     Tenderness: There is no abdominal tenderness. There is no guarding or rebound.  Genitourinary:    Penis: Normal. No tenderness.      Rectum: Normal.  Guaiac result negative.  Musculoskeletal:        General: No tenderness. Normal range of motion.     Cervical back: Neck supple.  Lymphadenopathy:     Cervical: No cervical adenopathy.  Skin:    General: Skin is warm and dry.     Coloration: Skin is not pale.     Findings: No erythema or rash.  Neurological:     Mental Status: He is alert and oriented to person, place, and time.     Cranial Nerves: No cranial nerve deficit.     Motor: No abnormal muscle tone.     Coordination: Coordination normal.     Deep Tendon Reflexes: Reflexes are normal and symmetric. Reflexes normal.  Psychiatric:        Behavior: Behavior normal.        Thought Content: Thought content normal.        Judgment: Judgment normal.           Assessment & Plan:  Well exam. We discussed diet and exercise. Get fasting labs. Try temazepam for sleep.  [name], MD

## 2020-10-29 LAB — CBC WITH DIFFERENTIAL/PLATELET
Absolute Monocytes: 595 cells/uL (ref 200–950)
Basophils Absolute: 19 cells/uL (ref 0–200)
Basophils Relative: 0.3 %
Eosinophils Absolute: 109 cells/uL (ref 15–500)
Eosinophils Relative: 1.7 %
HCT: 40.4 % (ref 38.5–50.0)
Hemoglobin: 13.7 g/dL (ref 13.2–17.1)
Lymphs Abs: 2611 cells/uL (ref 850–3900)
MCH: 31.6 pg (ref 27.0–33.0)
MCHC: 33.9 g/dL (ref 32.0–36.0)
MCV: 93.1 fL (ref 80.0–100.0)
MPV: 10 fL (ref 7.5–12.5)
Monocytes Relative: 9.3 %
Neutro Abs: 3066 cells/uL (ref 1500–7800)
Neutrophils Relative %: 47.9 %
Platelets: 230 10*3/uL (ref 140–400)
RBC: 4.34 10*6/uL (ref 4.20–5.80)
RDW: 13.4 % (ref 11.0–15.0)
Total Lymphocyte: 40.8 %
WBC: 6.4 10*3/uL (ref 3.8–10.8)

## 2020-10-29 LAB — HEMOGLOBIN A1C
Hgb A1c MFr Bld: 8.7 % of total Hgb — ABNORMAL HIGH (ref <5.7)
Mean Plasma Glucose: 203 (calc)
eAG (mmol/L): 11.2 (calc)

## 2020-10-29 LAB — HEPATIC FUNCTION PANEL
AG Ratio: 1.1 (calc) (ref 1.0–2.5)
ALT: 25 U/L (ref 9–46)
AST: 16 U/L (ref 10–35)
Albumin: 4.3 g/dL (ref 3.6–5.1)
Alkaline phosphatase (APISO): 83 U/L (ref 35–144)
Bilirubin, Direct: 0.3 mg/dL — ABNORMAL HIGH (ref 0.0–0.2)
Globulin: 3.8 g/dL (calc) — ABNORMAL HIGH (ref 1.9–3.7)
Indirect Bilirubin: 0.4 mg/dL (calc) (ref 0.2–1.2)
Total Bilirubin: 0.7 mg/dL (ref 0.2–1.2)
Total Protein: 8.1 g/dL (ref 6.1–8.1)

## 2020-10-29 LAB — PSA: PSA: <0.04 ng/mL (ref <=4.0)

## 2020-10-29 LAB — BASIC METABOLIC PANEL WITH GFR
BUN/Creatinine Ratio: 19 (calc) (ref 6–22)
BUN: 31 mg/dL — ABNORMAL HIGH (ref 7–25)
CO2: 25 mmol/L (ref 20–32)
Calcium: 9.2 mg/dL (ref 8.6–10.3)
Chloride: 99 mmol/L (ref 98–110)
Creat: 1.63 mg/dL — ABNORMAL HIGH (ref 0.70–1.18)
GFR, Est African American: 46 mL/min/{1.73_m2} — ABNORMAL LOW (ref >=60)
GFR, Est Non African American: 40 mL/min/{1.73_m2} — ABNORMAL LOW (ref >=60)
Glucose, Bld: 143 mg/dL — ABNORMAL HIGH (ref 65–99)
Potassium: 4.5 mmol/L (ref 3.5–5.3)
Sodium: 135 mmol/L (ref 135–146)

## 2020-10-29 LAB — VITAMIN B12: Vitamin B-12: >2000 pg/mL — ABNORMAL HIGH (ref 200–1100)

## 2020-10-29 LAB — LIPID PANEL
Cholesterol: 141 mg/dL (ref <200)
HDL: 20 mg/dL — ABNORMAL LOW (ref >=40)
Non-HDL Cholesterol (Calc): 121 mg/dL (calc) (ref <130)
Total CHOL/HDL Ratio: 7.1 (calc) — ABNORMAL HIGH (ref <5.0)
Triglycerides: 476 mg/dL — ABNORMAL HIGH (ref <150)

## 2020-10-29 LAB — T4, FREE: Free T4: 1.4 ng/dL (ref 0.8–1.8)

## 2020-10-29 LAB — T3, FREE: T3, Free: 2.9 pg/mL (ref 2.3–4.2)

## 2020-10-29 LAB — TSH: TSH: 0.52 mIU/L (ref 0.40–4.50)

## 2020-10-31 ENCOUNTER — Telehealth: Payer: Self-pay

## 2020-10-31 MED ORDER — SITAGLIPTIN PHOSPHATE 100 MG PO TABS: 100.0000 mg | ORAL_TABLET | Freq: Every day | ORAL | 3 refills | Status: DC

## 2020-10-31 NOTE — Telephone Encounter (Signed)
-----   Message from Laurey Morale, MD sent at 10/31/2020  7:53 AM EST ----- Normal except his diabetes is a little out of control. Add Januvia 100 mg daily. Call in #[phone] with 3 rf, and let's recheck an A1c in 90 days

## 2020-10-31 NOTE — Telephone Encounter (Signed)
Left detailed message for patient and januvia rx sent to pharmacy.

## 2020-11-01 ENCOUNTER — Inpatient Hospital Stay: Payer: PPO | Attending: Hematology

## 2020-11-01 ENCOUNTER — Other Ambulatory Visit: Payer: Self-pay

## 2020-11-01 ENCOUNTER — Telehealth: Payer: Self-pay | Admitting: Family Medicine

## 2020-11-01 DIAGNOSIS — D472 Monoclonal gammopathy: Secondary | ICD-10-CM | POA: Diagnosis not present

## 2020-11-01 LAB — CMP (CANCER CENTER ONLY)
ALT: 19 U/L (ref 0–44)
AST: 13 U/L — ABNORMAL LOW (ref 15–41)
Albumin: 3.5 g/dL (ref 3.5–5.0)
Alkaline Phosphatase: 87 U/L (ref 38–126)
Anion gap: 9 (ref 5–15)
BUN: 29 mg/dL — ABNORMAL HIGH (ref 8–23)
CO2: 22 mmol/L (ref 22–32)
Calcium: 9 mg/dL (ref 8.9–10.3)
Chloride: 104 mmol/L (ref 98–111)
Creatinine: 1.78 mg/dL — ABNORMAL HIGH (ref 0.61–1.24)
GFR, Estimated: 39 mL/min — ABNORMAL LOW (ref >60)
Glucose, Bld: 291 mg/dL — ABNORMAL HIGH (ref 70–99)
Potassium: 4.5 mmol/L (ref 3.5–5.1)
Sodium: 135 mmol/L (ref 135–145)
Total Bilirubin: 0.5 mg/dL (ref 0.3–1.2)
Total Protein: 8.1 g/dL (ref 6.5–8.1)

## 2020-11-01 NOTE — Telephone Encounter (Signed)
Pt need to know the instruction for his medication he stated it was his medication  For his blood sugars  please call pt

## 2020-11-04 LAB — MULTIPLE MYELOMA PANEL, SERUM
Albumin SerPl Elph-Mcnc: 3.5 g/dL (ref 2.9–4.4)
Albumin/Glob SerPl: 0.9 (ref 0.7–1.7)
Alpha 1: 0.2 g/dL (ref 0.0–0.4)
Alpha2 Glob SerPl Elph-Mcnc: 1 g/dL (ref 0.4–1.0)
B-Globulin SerPl Elph-Mcnc: 0.9 g/dL (ref 0.7–1.3)
Gamma Glob SerPl Elph-Mcnc: 1.7 g/dL (ref 0.4–1.8)
Globulin, Total: 3.9 g/dL (ref 2.2–3.9)
IgA: 74 mg/dL (ref 61–437)
IgG (Immunoglobin G), Serum: 1990 mg/dL — ABNORMAL HIGH (ref 603–1613)
IgM (Immunoglobulin M), Srm: 37 mg/dL (ref 15–143)
M Protein SerPl Elph-Mcnc: 1.4 g/dL — ABNORMAL HIGH
Total Protein ELP: 7.4 g/dL (ref 6.0–8.5)

## 2020-11-04 NOTE — Telephone Encounter (Signed)
I spoke with pt, all questions answered.

## 2020-11-05 ENCOUNTER — Other Ambulatory Visit: Payer: PPO

## 2020-11-12 ENCOUNTER — Other Ambulatory Visit: Payer: Self-pay | Admitting: Hematology

## 2020-11-12 ENCOUNTER — Inpatient Hospital Stay: Payer: PPO | Admitting: Hematology

## 2020-11-12 ENCOUNTER — Telehealth: Payer: Self-pay | Admitting: Hematology

## 2020-11-12 ENCOUNTER — Other Ambulatory Visit: Payer: Self-pay

## 2020-11-12 ENCOUNTER — Telehealth: Payer: Self-pay | Admitting: Family Medicine

## 2020-11-12 VITALS — BP 157/69 | HR 80 | Temp 97.8°F | Resp 15 | Ht 71.0 in | Wt 225.2 lb

## 2020-11-12 DIAGNOSIS — D472 Monoclonal gammopathy: Secondary | ICD-10-CM

## 2020-11-12 MED ORDER — ONETOUCH DELICA LANCETS 30G MISC: 3 refills | Status: AC

## 2020-11-12 MED ORDER — GLUCOSE BLOOD VI STRP: ORAL_STRIP | 3 refills | Status: DC

## 2020-11-12 MED ORDER — ONETOUCH ULTRA 2 W/DEVICE KIT: PACK | 0 refills | Status: AC

## 2020-11-12 NOTE — Telephone Encounter (Signed)
New supplies sent in to pt's pharmacy for one touch ultra.

## 2020-11-12 NOTE — Progress Notes (Signed)
HEMATOLOGY/ONCOLOGY CLINIC NOTE  Date of Service: 11/12/2020  Patient Care Team: Laurey Morale, MD as PCP - General (Family Medicine) Earnie Larsson, [facility] as Pharmacist (Pharmacist)  CHIEF COMPLAINTS/PURPOSE OF CONSULTATION:  Positive M-Spike   HISTORY OF PRESENTING ILLNESS:   Troy Howard 79 y.o. male is a here because of positive M-spike. The patient was referred by nephrologist Dr. Hollie Salk. The patient presents to the clinic today accompanied by his wife.  He is being seen by nephrologist who pursued further work up to why his Creatinine has increased. Upon workup his M-protein was found to be 0.8g/dl.  He has had HTN and DM for many years that can affects his Kidney function and was thought to be the likely cause of his CKD.   Today his wife notes he has had muscle pain, muscle mass loss and fatigue.   He has HTN, DM, GERD, Hypothyroid, Hyperlipidemia. He was recently taken off statins, meloxicam, ASA and metformin. He takes Nexium for her GERD and has been on for many years.   On review of symptoms, pt notes purposeful weight loss, muscle loss and muscle pain. He also has leg pain from b/l knees to anterior shin.  Interval History:  SHEP PORTER returns today regarding his M-spike. We are joined today by his wife, Mrs. Cangelosi. The patient's last visit with [place] was on 11/14/2019. The pt reports that he is doing well overall.  The pt reports that he has felt well and has been able to stay fairly active. Pt has not been keeping a close eye on his blood glucose at home. He has received his COVID19 vaccines and booster.   Lab results (10/28/2020) of CBC w/diff is as follows: all values are WNL. 11/01/2020 CMP is as follows all values are WNL except for Glucose at 291, BUN at 29, Creatinine at 1.78, AST at 13, GFR Est at 39. 11/01/2020 MMP shows all values are WNL except for IgG at [place], M Protein at 1.4.  On review of systems, pt denies new bone pain, worsening  fatigue, abdominal pain, back pain and any other symptoms.   MEDICAL HISTORY:  Past Medical History:  Diagnosis Date  . Arthritis    back  . Cancer [facility])    prostate, sees Dr. Raynelle Bring   . Diabetes mellitus   . Erectile dysfunction   . GERD (gastroesophageal reflux disease)   . Hyperlipidemia   . Hypertension   . Migraines   . Peptic ulcer     SURGICAL HISTORY: Past Surgical History:  Procedure Laterality Date  . APPENDECTOMY  1950  . COLONOSCOPY  11/14/2015   per Dr. Fuller Plan, adenomatous polyps, repeat in 5 yrs   . HERNIA REPAIR     umbilical   . MENISCUS REPAIR Left   . PROSTATE SURGERY  2009   radical prostatectomy  . SPINE SURGERY     L4-L5 fusion per Dr. Glenna Fellows   . TONSILLECTOMY AND ADENOIDECTOMY  1954    SOCIAL HISTORY: Social History   Socioeconomic History  . Marital status: Married    Spouse name: Not on file  . Number of children: Not on file  . Years of education: Not on file  . Highest education level: Not on file  Occupational History  . Not on file  Tobacco Use  . Smoking status: Never Smoker  . Smokeless tobacco: Never Used  Vaping Use  . Vaping Use: Never used  Substance and Sexual Activity  . Alcohol use:  Not Currently    Comment: rare  . Drug use: No  . Sexual activity: Not on file  Other Topics Concern  . Not on file  Social History Narrative  . Not on file   Social Determinants of Health   Financial Resource Strain: Not on file  Food Insecurity: Not on file  Transportation Needs: Not on file  Physical Activity: Not on file  Stress: Not on file  Social Connections: Not on file  Intimate Partner Violence: Not on file    FAMILY HISTORY: Family History  Problem Relation Age of Onset  . Hypertension Other   . Cancer Other   . Colon cancer Neg Hx     ALLERGIES:  has No Known Allergies.  MEDICATIONS:  Current Outpatient Medications  Medication Sig Dispense Refill  . aspirin-acetaminophen-caffeine (EXCEDRIN MIGRAINE)  250-250-65 MG tablet Take 1 tablet by mouth every 8 (eight) hours as needed for migraine.    . CHELATED ZINC PO Take 1 capsule by mouth daily.    . Cholecalciferol (VITAMIN D3) 2000 units capsule Take 2,000 Units by mouth daily.     . Cyanocobalamin (VITAMIN B 12 PO) Take 2,500 mcg by mouth daily.     Marland Kitchen esomeprazole (NEXIUM) 40 MG capsule TAKE 1 CAPSULE BY MOUTH EVERY DAY 90 capsule 3  . FOLIC ACID PO Take 800 mcg by mouth daily.     Marland Kitchen glipiZIDE (GLUCOTROL) 10 MG tablet Take 1 tablet (10 mg total) by mouth 2 (two) times daily before a meal. 180 tablet 3  . GNP GARLIC EXTRACT PO Take 800 mg by mouth daily.    . Levomefolate Glucosamine (METHYLFOLATE PO) Take 1 tablet by mouth daily.    Marland Kitchen losartan (COZAAR) 100 MG tablet Take 1 tablet (100 mg total) by mouth daily. 90 tablet 3  . Omega-3 Fatty Acids (FISH OIL) 1000 MG CAPS Take by mouth daily.     Letta Pate Delica Lancets 30G MISC USE ONCE A DAY WHEN CHECKING BLOOD SUGAR 100 each 3  . ONETOUCH ULTRA test strip USE ONCE A DAY TO TEST BLOOD SUGAR 100 strip 3  . pyridOXINE (VITAMIN B-6) 100 MG tablet Take 100 mg by mouth daily.    . sitaGLIPtin (JANUVIA) 100 MG tablet Take 1 tablet (100 mg total) by mouth daily. 90 tablet 3  . SYNTHROID 137 MCG tablet Take 1 tablet (137 mcg total) by mouth daily before breakfast. 90 tablet 3  . tamsulosin (FLOMAX) 0.4 MG CAPS capsule Take 2 capsules (0.8 mg total) by mouth daily. 180 capsule 3  . temazepam (RESTORIL) 30 MG capsule Take 1 capsule (30 mg total) by mouth at bedtime as needed for sleep. 30 capsule 5   No current facility-administered medications for this visit.    REVIEW OF SYSTEMS:   A 10+ POINT REVIEW OF SYSTEMS WAS OBTAINED including neurology, dermatology, psychiatry, cardiac, respiratory, lymph, extremities, GI, GU, Musculoskeletal, constitutional, breasts, reproductive, HEENT.  All pertinent positives are noted in the HPI.  All others are negative.   PHYSICAL EXAMINATION: ECOG PERFORMANCE  STATUS: 1 - Symptomatic but completely ambulatory  . Vitals:   11/12/20 0932  BP: (!) 157/69  Pulse: 80  Resp: 15  Temp: 97.8 F (36.6 C)  SpO2: 100%   Filed Weights   11/12/20 0932  Weight: 225 lb 3.2 oz (102.2 kg)   .Body mass index is 31.41 kg/m.   GENERAL:alert, in no acute distress and comfortable SKIN: no acute rashes, no significant lesions EYES: conjunctiva are pink and  non-injected, sclera anicteric OROPHARYNX: MMM, no exudates, no oropharyngeal erythema or ulceration NECK: supple, no JVD LYMPH:  no palpable lymphadenopathy in the cervical, axillary or inguinal regions LUNGS: clear to auscultation b/l with normal respiratory effort HEART: regular rate & rhythm ABDOMEN:  normoactive bowel sounds , non tender, not distended. No palpable hepatosplenomegaly.  Extremity: no pedal edema PSYCH: alert & oriented x 3 with fluent speech NEURO: no focal motor/sensory deficits  LABORATORY DATA:  I have reviewed the data as listed  . CBC Latest Ref Rng & Units 10/28/2020 10/24/2019 11/08/2018  WBC 3.8 - 10.8 Thousand/uL 6.4 7.1 6.3  Hemoglobin 13.2 - 17.1 g/dL 13.7 14.0 14.1  Hematocrit 38.5 - 50.0 % 40.4 41.1 41.5  Platelets 140 - 400 Thousand/uL 230 214.0 182   . CBC    Component Value Date/Time   WBC 6.4 10/28/2020 1337   RBC 4.34 10/28/2020 1337   HGB 13.7 10/28/2020 1337   HCT 40.4 10/28/2020 1337   PLT 230 10/28/2020 1337   MCV 93.1 10/28/2020 1337   MCH 31.6 10/28/2020 1337   MCHC 33.9 10/28/2020 1337   RDW 13.4 10/28/2020 1337   LYMPHSABS 2,611 10/28/2020 1337   MONOABS 0.5 10/24/2019 1419   EOSABS 109 10/28/2020 1337   BASOSABS 19 10/28/2020 1337    . CMP Latest Ref Rng & Units 11/01/2020 10/28/2020 11/07/2019  Glucose 70 - 99 mg/dL 291(H) 143(H) 73  BUN 8 - 23 mg/dL 29(H) 31(H) 28(H)  Creatinine 0.61 - 1.24 mg/dL 1.78(H) 1.63(H) 1.61(H)  Sodium 135 - 145 mmol/L 135 135 139  Potassium 3.5 - 5.1 mmol/L 4.5 4.5 4.4  Chloride 98 - 111 mmol/L 104 99  103  CO2 22 - 32 mmol/L $RemoveB'22 25 28  'EjZMcVKu$ Calcium 8.9 - 10.3 mg/dL 9.0 9.2 9.2  Total Protein 6.5 - 8.1 g/dL 8.1 8.1 7.6  Total Bilirubin 0.3 - 1.2 mg/dL 0.5 0.7 0.6  Alkaline Phos 38 - 126 U/L 87 - 64  AST 15 - 41 U/L 13(L) 16 16  ALT 0 - 44 U/L $Remo'19 25 22   'PJOYp$ Component     Latest Ref Rng & Units 05/04/2018  Iron     42 - 163 ug/dL 75  TIBC     202 - 409 ug/dL 255  Saturation Ratios     42 - 163 % 29 (L)  UIBC     ug/dL 180  LDH     125 - 245 U/L 143  Sed Rate     0 - 16 mm/hr 20 (H)  Ferritin     22 - 316 ng/mL 167  Intrinsic Factor     0.0 - 1.1 AU/mL 1.0  Parietal Cell Antibody-IgG     0.0 - 20.0 Units 1.9   Component     Latest Ref Rng & Units 05/09/2018  Total Protein, Urine-UPE24     Not Estab. mg/dL 7.6  Total Protein, Urine-Ur/day     30 - 150 mg/24 hr 148  ALBUMIN, U     % 24.2  ALPHA 1 URINE     % 3.9  Alpha 2, Urine     % 12.7  % BETA, Urine     % 26.6  GAMMA GLOBULIN URINE     % 32.6  Free Kappa Lt Chains,Ur     1.35 - 24.19 mg/L 82.60 (H)  Free Lambda Lt Chains,Ur     0.24 - 6.66 mg/L 16.80 (H)  Free Kappa/Lambda Ratio     2.04 - 10.37 4.92  Immunofixation Result, Urine      Comment  Total Volume      1,950  M-SPIKE %, Urine     Not Observed % Not Observed  NOTE:      Comment      OUTSIDE LABS 04/21/18       RADIOGRAPHIC STUDIES: I have personally reviewed the radiological images as listed and agreed with the findings in the report. DG Chest 2 View  Result Date: 10/13/2020 CLINICAL DATA:  79 year old male with cough EXAM: CHEST - 2 VIEW COMPARISON:  Chest radiograph dated 07/03/2011. FINDINGS: The lungs are clear. There is no pleural effusion pneumothorax. The cardiac silhouette is within limits. Atherosclerotic calcification of the aorta. Degenerative changes of the spine. No acute osseous pathology. IMPRESSION: No active cardiopulmonary disease. Electronically Signed   By: Anner Crete M.D.   On: 10/13/2020 15:08    ASSESSMENT & PLAN:    ZAELYN BARBARY is a 79 y.o. caucasian male with    1. IgG Lambda monoclonal paraproteinemia  He has a M-spike of 0.8g/dl and normal SFLC and ratio. No anemia - nl Hgb No focal bone pains --- Skeletal survey - shows no  No hypercalcemia. 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  05/10/18 Metastatic Bone Survey revealed no lytic lesions on skeletal survey to suggest multiple myeloma.   PLAN:  -Discussed pt labwork, 11/12/20; blood counts are nml, kidney numbers are gradually increasing, slight increase in M Protein. -Discussed the CRAB criteria: no hypercalcemia, relatively stable renal function, no anemia, no new bone pain (no bone lesions?).  -No lab or clinical evidence of MGUS progression at this time.  -Advised pt that a BM Bx could tell us definitively if there has been progression but is not absolutely necessary at this time. -Pt prefers to watch with regular labs and clinic visits. Will get a BM Bx if M Protein continues to rise.  -Advised pt that if there is enough concern for the accumulation of abnormal protein in the kidneys affecting the function his Nephrologist could perform a renal biopsy. Thus far they have felt comfortable that his change in renal function is caused by Diabetes and HTN.  -Recommend pt f/u with PCP for Diabetes and HTN management.  -Recommend pt begin 2000 IU Vitamin D daily. -Will see back in 4 months, with labs 1 week prior    2. 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 Hollie Salk  -mild non nephrotic proteinuria on 24h UPEP-Continue follow up with Dr Hollie Salk for CKD    3. Vitamin B12 deficient  Antibody testing done - no evidence of pernicious anemia. No evidence of pernicious anemia based on neg IF and parietal cell ab -He has been actively losing weight by eating  healthy with no diet restrictions.  -I discussed the possible cause of this deficiency such as long term use of Nexium and Metformin. He is no longer on Metformin.  -continue B12 replacement per PCP  4.  Past Medical History:  Diagnosis Date  . Arthritis    back  . Cancer [facility])    prostate, sees Dr. Raynelle Bring   . Diabetes mellitus   . Erectile dysfunction   . GERD (gastroesophageal reflux disease)   . Hyperlipidemia   . Hypertension   . Migraines   .  Peptic ulcer     FOLLOW UP: Labs in 15 weeks RTC with Dr Irene Limbo in 16 weeks   The total time spent in the appt was 20 minutes and more than 50% was on counseling and direct patient cares.  All of the patient's questions were answered with apparent satisfaction. The patient knows to call the clinic with any problems, questions or concerns.    Sullivan Lone MD [place] AAHIVMS [facility] Hematology/Oncology Physician [facility]  (Office):       [phone] (Work cell):  [phone] (Fax):           [phone]  11/12/2020 10:24 AM  I, Yevette Edwards, am acting as a scribe for Dr. Sullivan Lone.   .I have reviewed the above documentation for accuracy and completeness, and I agree with the above. [name] MD

## 2020-11-12 NOTE — Telephone Encounter (Signed)
Scheduled appointments per 12/14 los. Spoke to patient who is aware of appointments dates and times.  

## 2020-11-12 NOTE — Telephone Encounter (Signed)
Patient called back and stated that insurance covers one touch and frees style meters, please advise. CB is [phone]

## 2020-11-12 NOTE — Telephone Encounter (Signed)
Pt is calling in stating that his glucose meter has stopped working and would like to see if he could get another one sent to the pharmacy and was advise to check with his insurance to see which one that they will cover.  Pt stated that he would do that and give [place] a call back with that information.

## 2020-11-28 ENCOUNTER — Telehealth: Payer: Self-pay | Admitting: Pharmacist

## 2020-11-28 NOTE — Chronic Care Management (AMB) (Signed)
Chronic Care Management Pharmacy Assistant   Name: Troy Howard  MRN: [id number] DOB: [birth year]  Reason for Encounter: Disease State/ Hypertension Assessment   PCP : Laurey Morale, MD  Allergies:  No Known Allergies  Medications: Outpatient Encounter Medications as of 11/28/2020  Medication Sig  . aspirin-acetaminophen-caffeine (EXCEDRIN MIGRAINE) 250-250-65 MG tablet Take 1 tablet by mouth every 8 (eight) hours as needed for migraine.  . Blood Glucose Monitoring Suppl (ONE TOUCH ULTRA 2) w/Device KIT Use to check blood sugars once daily.  . CHELATED ZINC PO Take 1 capsule by mouth daily.  . Cholecalciferol (VITAMIN D3) 2000 units capsule Take 2,000 Units by mouth daily.   . Cyanocobalamin (VITAMIN B 12 PO) Take 2,500 mcg by mouth daily.   Marland Kitchen esomeprazole (NEXIUM) 40 MG capsule TAKE 1 CAPSULE BY MOUTH EVERY DAY  . FOLIC ACID PO Take 992 mcg by mouth daily.   Marland Kitchen glipiZIDE (GLUCOTROL) 10 MG tablet Take 1 tablet (10 mg total) by mouth 2 (two) times daily before a meal.  . glucose blood test strip USE ONCE A DAY TO TEST BLOOD SUGAR  . GNP GARLIC EXTRACT PO Take 426 mg by mouth daily.  . Levomefolate Glucosamine (METHYLFOLATE PO) Take 1 tablet by mouth daily.  Marland Kitchen losartan (COZAAR) 100 MG tablet Take 1 tablet (100 mg total) by mouth daily.  . Omega-3 Fatty Acids (FISH OIL) 1000 MG CAPS Take by mouth daily.   Glory Rosebush Delica Lancets 83M MISC USE ONCE A DAY WHEN CHECKING BLOOD SUGAR  . pyridOXINE (VITAMIN B-6) 100 MG tablet Take 100 mg by mouth daily.  . sitaGLIPtin (JANUVIA) 100 MG tablet Take 1 tablet (100 mg total) by mouth daily.  Marland Kitchen SYNTHROID 137 MCG tablet Take 1 tablet (137 mcg total) by mouth daily before breakfast.  . tamsulosin (FLOMAX) 0.4 MG CAPS capsule Take 2 capsules (0.8 mg total) by mouth daily.  . temazepam (RESTORIL) 30 MG capsule Take 1 capsule (30 mg total) by mouth at bedtime as needed for sleep.   No facility-administered encounter medications on file as of  11/28/2020.    Current Diagnosis: Patient Active Problem List   Diagnosis Date Noted  . Dyslipidemia 02/22/2018  . Hypothyroidism 02/22/2018  . Diabetes mellitus without complication ([place]) 19/62/2297  . 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 PROSTATE CANCER, HX OF 11/13/2010  . GERD 01/17/2008  . ACUT PEPTC ULCR UNS SITE W/HEM W/O MENTION OBST 01/17/2008  . ERECTILE DYSFUNCTION 11/23/2007  . HYPERTENSION, BENIGN 11/23/2007  . PROSTATE SPECIFIC ANTIGEN, ELEVATED 11/23/2007  . MIGRAINE HEADACHE 06/13/2007    Goals Addressed   None     Reviewed chart prior to disease state call. Spoke with patient regarding BP  Recent Office Vitals: BP Readings from Last 3 Encounters:  11/12/20 (!) 157/69  10/28/20 126/70  10/13/20 (!) 173/88   Pulse Readings from Last 3 Encounters:  11/12/20 80  10/28/20 79  10/13/20 68    Wt Readings from Last 3 Encounters:  11/12/20 225 lb 3.2 oz (102.2 kg)  10/28/20 222 lb 9.6 oz (101 kg)  11/14/19 226 lb 1.6 oz (102.6 kg)     Kidney Function Lab Results  Component Value Date/Time   CREATININE 1.78 (H) 11/01/2020 09:01 AM   CREATININE 1.63 (H) 10/28/2020 01:37 PM   CREATININE 1.61 (H) 11/07/2019 11:27 AM   GFR 47.18 (L) 10/24/2019 02:19 PM   GFRNONAA 39 (L) 11/01/2020 09:01 AM   GFRNONAA 40 (L) 10/28/2020 01:37 PM   GFRAA 46 (L)  10/28/2020 01:37 PM    BMP Latest Ref Rng & Units 11/01/2020 10/28/2020 11/07/2019  Glucose 70 - 99 mg/dL 291(H) 143(H) 73  BUN 8 - 23 mg/dL 29(H) 31(H) 28(H)  Creatinine 0.61 - 1.24 mg/dL 1.78(H) 1.63(H) 1.61(H)  BUN/Creat Ratio 6 - 22 (calc) - 19 -  Sodium 135 - 145 mmol/L 135 135 139  Potassium 3.5 - 5.1 mmol/L 4.5 4.5 4.4  Chloride 98 - 111 mmol/L 104 99 103  CO2 22 - 32 mmol/L $RemoveB'22 25 28  'isTATywE$ Calcium 8.9 - 10.3 mg/dL 9.0 9.2 9.2    . Current antihypertensive regimen:  . Losartan (COZAAR) 100 MG tablet Daily . How often are you checking your Blood Pressure? infrequently . Current home BP readings: Unknown . What  recent interventions/DTPs have been made by any provider to improve Blood Pressure control since last CPP Visit: None . Any recent hospitalizations or ED visits since last visit with CPP? No . What diet changes have been made to improve Blood Pressure Control?  o None . What exercise is being done to improve your Blood Pressure Control?  o None  Adherence Review: Is the patient currently on ACE/ARB medication? No Does the patient have >5 day gap between last estimated fill dates? No  Follow-Up:  Pharmacist Review   Maia Breslow, [place] Assistant [phone]

## 2021-01-29 NOTE — Progress Notes (Signed)
Subjective:   Troy Howard is a 80 y.o. male who presents for an Initial Medicare Annual Wellness Visit.  Virtual Visit via Video Note  I connected with Troy Howard by a video enabled telemedicine application and verified that I am speaking with the correct person using two identifiers.  Location: Patient: Home Provider: Office Persons participating in the virtual visit: patient, provider   I discussed the limitations of evaluation and management by telemedicine and the availability of in person appointments. The patient expressed understanding and agreed to proceed.     [place] Jermarcus Mcfadyen,LPN    Review of Systems    N/A  Cardiac Risk Factors include: advanced age (>13mn, >>41women);male gender;hypertension     Objective:    Today's Vitals   There is no height or weight on file to calculate BMI.  Advanced Directives 01/30/2021 07/12/2018  Does Patient Have a Medical Advance Directive? Yes Yes  Type of Advance Directive Living will -  Does patient want to make changes to medical advance directive? No - Patient declined No - Patient declined    Current Medications (verified) Outpatient Encounter Medications as of 01/30/2021  Medication Sig   aspirin-acetaminophen-caffeine (EXCEDRIN MIGRAINE) 250-250-65 MG tablet Take 1 tablet by mouth every 8 (eight) hours as needed for migraine.   Blood Glucose Monitoring Suppl (ONE TOUCH ULTRA 2) w/Device KIT Use to check blood sugars once daily.   CHELATED ZINC PO Take 1 capsule by mouth daily.   Cholecalciferol (VITAMIN D3) 2000 units capsule Take 2,000 Units by mouth daily.    Cyanocobalamin (VITAMIN B 12 PO) Take 2,500 mcg by mouth daily.    esomeprazole (NEXIUM) 40 MG capsule TAKE 1 CAPSULE BY MOUTH EVERY DAY   FOLIC ACID PO Take 8450mcg by mouth daily.    glipiZIDE (GLUCOTROL) 10 MG tablet Take 1 tablet (10 mg total) by mouth 2 (two) times daily before a meal.   glucose blood test strip USE ONCE A DAY TO TEST  BLOOD SUGAR   GNP GARLIC EXTRACT PO Take 8388mg by mouth daily.   Levomefolate Glucosamine (METHYLFOLATE PO) Take 1 tablet by mouth daily.   losartan (COZAAR) 100 MG tablet Take 1 tablet (100 mg total) by mouth daily.   Omega-3 Fatty Acids (FISH OIL) 1000 MG CAPS Take by mouth daily.    OneTouch Delica Lancets 382CMISC USE ONCE A DAY WHEN CHECKING BLOOD SUGAR   pyridOXINE (VITAMIN B-6) 100 MG tablet Take 100 mg by mouth daily.   sitaGLIPtin (JANUVIA) 100 MG tablet Take 1 tablet (100 mg total) by mouth daily.   SYNTHROID 137 MCG tablet Take 1 tablet (137 mcg total) by mouth daily before breakfast.   tamsulosin (FLOMAX) 0.4 MG CAPS capsule Take 2 capsules (0.8 mg total) by mouth daily.   temazepam (RESTORIL) 30 MG capsule Take 1 capsule (30 mg total) by mouth at bedtime as needed for sleep.   No facility-administered encounter medications on file as of 01/30/2021.    Allergies (verified) Patient has no known allergies.   History: Past Medical History:  Diagnosis Date   Arthritis    back   Cancer ([facility]    prostate, sees Dr. LRaynelle Bring   Diabetes mellitus    Erectile dysfunction    GERD (gastroesophageal reflux disease)    Hyperlipidemia    Hypertension    Migraines    Peptic ulcer    Past Surgical History:  Procedure Laterality Date   APPENDECTOMY  1950   COLONOSCOPY  11/14/2015   per Dr. Fuller Plan, adenomatous polyps, repeat in 5 yrs    HERNIA REPAIR     umbilical    MENISCUS REPAIR Left    PROSTATE SURGERY  2009   radical prostatectomy   SPINE SURGERY     L4-L5 fusion per Dr. Glenna Fellows    TONSILLECTOMY AND ADENOIDECTOMY  1954   Family History  Problem Relation Age of Onset   Hypertension Other    Cancer Other    Colon cancer Neg Hx    Social History   Socioeconomic History   Marital status: Married    Spouse name: Not on file   Number of children: Not on file   Years of education: Not on file   Highest education level: Not on  file  Occupational History   Not on file  Tobacco Use   Smoking status: Never Smoker   Smokeless tobacco: Never Used  Vaping Use   Vaping Use: Never used  Substance and Sexual Activity   Alcohol use: Not Currently    Comment: rare   Drug use: No   Sexual activity: Not on file  Other Topics Concern   Not on file  Social History Narrative   Not on file   Social Determinants of Health   Financial Resource Strain: Low Risk    Difficulty of Paying Living Expenses: Not hard at all  Food Insecurity: No Food Insecurity   Worried About Charity fundraiser in the Last Year: Never true   [place] in the Last Year: Never true  Transportation Needs: No Transportation Needs   Lack of Transportation (Medical): No   Lack of Transportation (Non-Medical): No  Physical Activity: Inactive   Days of Exercise per Week: 0 days   Minutes of Exercise per Session: 0 min  Stress: No Stress Concern Present   Feeling of Stress : Not at all  Social Connections: Moderately Integrated   Frequency of Communication with Friends and Family: Twice a week   Frequency of Social Gatherings with Friends and Family: More than three times a week   Attends Religious Services: More than 4 times per year   Active Member of [employer] or Organizations: No   Attends Music therapist: Never   Marital Status: Married    Tobacco Counseling Counseling given: Not Answered   Clinical Intake:  Pre-visit preparation completed: Yes  Pain : No/denies pain     Nutritional Risks: None Diabetes: Yes CBG done?: No Did pt. bring in CBG monitor from home?: No  How often do you need to have someone help you when you read instructions, pamphlets, or other written materials from your doctor or pharmacy?: 1 - Never  Diabetic?Yes Nutrition Risk Assessment:  Has the patient had any N/V/D within the last 2 months?  No  Does the patient have any non-healing wounds?  No  Has the  patient had any unintentional weight loss or weight gain?  No   Diabetes:  Is the patient diabetic?  Yes  If diabetic, was a CBG obtained today?  No  Did the patient bring in their glucometer from home?  No  How often do you monitor your CBG's? States checks glucose once per day .   Financial Strains and Diabetes Management:  Are you having any financial strains with the device, your supplies or your medication? No .  Does the patient want to be seen by Chronic Care Management for management of their diabetes?  No  Would  the patient like to be referred to a Nutritionist or for Diabetic Management?  No   Diabetic Exams:  Diabetic Eye Exam: Overdue for diabetic eye exam. Pt has been advised about the importance in completing this exam. Patient advised to call and schedule an eye exam. Diabetic Foot Exam: Overdue, Pt has been advised about the importance in completing this exam. Pt is scheduled for diabetic foot exam on scheduled in person office visit.   Interpreter Needed?: No  Information entered by :: [place] of Daily Living In your present state of health, do you have any difficulty performing the following activities: 01/30/2021  Hearing? N  Vision? N  Difficulty concentrating or making decisions? N  Walking or climbing stairs? N  Dressing or bathing? N  Doing errands, shopping? N  Preparing Food and eating ? N  Using the Toilet? N  In the past six months, have you accidently leaked urine? N  Do you have problems with loss of bowel control? N  Managing your Medications? N  Managing your Finances? N  Housekeeping or managing your Housekeeping? N  Some recent data might be hidden    Patient Care Team: Laurey Morale, MD as PCP - General (Family Medicine) Earnie Larsson, [facility] as Pharmacist (Pharmacist)  Indicate any recent Medical Services you may have received from other than [facility] providers in the past year (date may be approximate).      Assessment:   This is a routine wellness examination for Khani.  Hearing/Vision screen  Hearing Screening   _0  _1  _2  _3  _4  _5  _6  _7  _8   Right ear:           Left ear:           Vision Screening Comments: Patient states gets eyes examined once per year   Dietary issues and exercise activities discussed: Current Exercise Habits: The patient has a physically strenuous job, but has no regular exercise apart from work.  Goals     Patient Stated     I would like to stay active      Pharmacy Care Plan     CARE PLAN ENTRY  Current Barriers:   Chronic Disease Management support, education, and care coordination needs related to HTN, HLD, DMII, CKD, and Migraines, GERD, Hypothyroidism, Nocturia  Pharmacist Clinical Goal(s):   Diabetes:   Maintain A1c < 7.0%  Maintain up to date on diabetes preventative health (eye exams every 1 to 2 years and foot exams at least once yearly).   Blood pressure:   Maintain blood pressure within goal of your provider (130/80 or 140/90).   Maintain low salt diet.   High cholesterol:   Cholesterol goals: Total Cholesterol goal under 200, Triglycerides goal under 150, HDL goal above 40 (men) or above 50 (women), LDL goal under 70.   Hypothyroidism:   Maintain TSH between 0.45 to 4.5uIU/ml.  Heart burn: Minimize reflux symptoms.   Nighttime urination  Minimize symptoms.   Vitamin D deficiency:   Maintain Vitamin D-25 level at or above 30.   Migraines: Continue to see improvement in pain level.   Interventions:  Comprehensive medication review performed.  Diabetes  Discussed importance of diabetes preventative health exams.  Obtain diabetic eye exam every 1 to 2 years.   Obtain at least once yearly foot exams.  Continue: glipizide 52m, 1 tablet twice daily before meals   Blood pressure:   Discussed need to continue checking blood pressure at home.   Discussed diet modifications.  DASH  diet:  following a diet emphasizing fruits and vegetables and low-fat dairy products along with whole grains, fish, poultry, and nuts. Reducing red meats and sugars.   Exercising  Reducing the amount of salt intake to <1560m/per day.   Recommend using a salt substitute to replace your salt if you need flavor.     Getting enough potassium in your diet equaling 3500-50040mday.  This helps to regulate BP by balancing out the effects of salt.    Weight reduction- We discussed losing 5-10% of body weight  Continue: losartan 10016m1 tablet daily  High cholesterol  How to reduce cholesterol through diet/weight management and physical activity.     We discussed how a diet high in plant sterols (fruits/vegetables/nuts/whole grains/legumes) may reduce your cholesterol.  Encouraged increasing fiber to a daily intake of 10-25g/day   Continue: diet modifications and physical activities.   Hypothyroidism  Continue: levothyroxine 137m44m1 tablet once daily before breakfast   GERD/ heart burn/ acid reflux:  Discussed non-pharmacological interventions for acid reflux. Take measures to prevent acid reflux, such as avoiding spicy foods, avoiding caffeine, avoid laying down a few hours after eating, and raising the head of the bed.  Continue: esomeprazole 40mg106mcapsule daily (around noon time).   Nighttime urination:   Continue tamsulosin 0.4mg, 81mapsules daily (takes 30 minutes following same meal everyday).   Chronic kidney disease  We discussed: maintaining proper hydration. Avoiding NSAID's  Vitamin D deficiency:   Continue: Vitamin D3, 2000 units daily  Vitamin B12 deficiency:   Continue: Vitamin B12 1000mcg 60my   Migraines:  Continue: Excedrin Migraine- takes as needed for occasional migraine  Patient Self Care Activities:   Self administers medications as prescribed and Calls provider office for new concerns or questions  Continue current medications as directed by  providers.   Continue following up with specialists.  Continue at home blood pressure readings.  Continue checking blood sugars.   Continue working on health habits (diet/ exercise).  Initial goal documentation       Depression Screen PHQ 2/9 Scores 01/30/2021 10/28/2020 10/24/2019 10/17/2018 08/30/2018 07/12/2018 10/13/2017  PHQ - 2 Score 0 0 0 0 0 0 0    Fall Risk Fall Risk  01/30/2021 10/28/2020 10/17/2018 08/30/2018 07/12/2018  Falls in the past year? 0 0 0 No No  Number falls in past yr: 0 0 - - -  Injury with Fall? 0 0 - - -  Risk for fall due to : No Fall Risks - - - -  Follow up Falls evaluation completed;Falls prevention discussed - - - -    FALL RISK PREVENTION PERTAINING TO THE HOME:  Any stairs in or around the home? Yes  If so, are there any without handrails? No  Home free of loose throw rugs in walkways, pet beds, electrical cords, etc? Yes  Adequate lighting in your home to reduce risk of falls? Yes   ASSISTIVE DEVICES UTILIZED TO PREVENT FALLS:  Life alert? No  Use of a cane, walker or w/c? No  Grab bars in the bathroom? Yes  Shower chair or bench in shower? No  Elevated toilet seat or a handicapped toilet? No    Cognitive Function:        Immunizations Immunization History  Administered Date(s) Administered   Influenza Whole 09/05/2010   Influenza, High Dose Seasonal PF 08/20/2015, 09/08/2017, 08/19/2018, 08/28/2019, 08/28/2019   Influenza,inj,Quad PF,6+ Mos 08/14/2014   Influenza-Unspecified 09/16/2016, 09/08/2017, 09/23/2020   Moderna Sars-Covid-2 Vaccination 01/05/2020,  01/31/2020, 09/24/2020   Pneumococcal Conjugate-13 08/14/2014   Pneumococcal Polysaccharide-23 11/23/2007   Zoster 11/19/2009   Zoster Recombinat (Shingrix) 10/17/2018, 01/28/2019    TDAP status: Due, Education has been provided regarding the importance of this vaccine. Advised may receive this vaccine at local pharmacy or Health Dept. Aware to provide a copy of the  vaccination record if obtained from local pharmacy or Health Dept. Verbalized acceptance and understanding.  Flu Vaccine status: Up to date  Pneumococcal vaccine status: Up to date  Covid-19 vaccine status: Completed vaccines  Qualifies for Shingles Vaccine? Yes   Zostavax completed Yes   Shingrix Completed?: Yes  Screening Tests Health Maintenance  Topic Date Due   Hepatitis C Screening  Never done   TETANUS/TDAP  Never done   OPHTHALMOLOGY EXAM  05/18/2019   FOOT EXAM  10/23/2020   COLONOSCOPY (Pts 45-90yr Insurance coverage will need to be confirmed)  11/13/2020   COVID-19 Vaccine (4 - Booster for Moderna series) 03/25/2021   HEMOGLOBIN A1C  04/27/2021   INFLUENZA VACCINE  Completed   PNA vac Low Risk Adult  Completed   HPV VACCINES  Aged Out    Health Maintenance  Health Maintenance Due  Topic Date Due   Hepatitis C Screening  Never done   TETANUS/TDAP  Never done   OPHTHALMOLOGY EXAM  05/18/2019   FOOT EXAM  10/23/2020   COLONOSCOPY (Pts 45-428yrInsurance coverage will need to be confirmed)  11/13/2020    Colorectal cancer screening: Referral to GI placed 01/30/2021. Pt aware the office will call re: appt.  Lung Cancer Screening: (Low Dose CT Chest recommended if Age 79-80ears, 30 pack-year currently smoking OR have quit w/in 15years.) does not qualify.   Lung Cancer Screening Referral: N/A   Additional Screening:  Hepatitis C Screening: does qualify;   Vision Screening: Recommended annual ophthalmology exams for early detection of glaucoma and other disorders of the eye. Is the patient up to date with their annual eye exam?  Yes  Who is the provider or what is the name of the office in which the patient attends annual eye exams? Dr. SaBufford Buttnerf pt is not established with a provider, would they like to be referred to a provider to establish care? No .   Dental Screening: Recommended annual dental exams for proper oral  hygiene  Community Resource Referral / Chronic Care Management: CRR required this visit?  No   CCM required this visit?  No      Plan:     I have personally reviewed and noted the following in the patients chart:    Medical and social history  Use of alcohol, tobacco or illicit drugs   Current medications and supplements  Functional ability and status  Nutritional status  Physical activity  Advanced directives  List of other physicians  Hospitalizations, surgeries, and ER visits in previous 12 months  Vitals  Screenings to include cognitive, depression, and falls  Referrals and appointments  In addition, I have reviewed and discussed with patient certain preventive protocols, quality metrics, and best practice recommendations. A written personalized care plan for preventive services as well as general preventive health recommendations were provided to patient.     ShOfilia NeasLPN   3/06/02/2594 Nurse Notes: None

## 2021-01-30 ENCOUNTER — Ambulatory Visit (INDEPENDENT_AMBULATORY_CARE_PROVIDER_SITE_OTHER): Payer: PPO

## 2021-01-30 DIAGNOSIS — Z1211 Encounter for screening for malignant neoplasm of colon: Secondary | ICD-10-CM | POA: Diagnosis not present

## 2021-01-30 DIAGNOSIS — Z0000 Encounter for general adult medical examination without abnormal findings: Secondary | ICD-10-CM | POA: Diagnosis not present

## 2021-01-30 NOTE — Patient Instructions (Signed)
Troy Howard , Thank you for taking time to come for your Medicare Wellness Visit. I appreciate your ongoing commitment to your health goals. Please review the following plan we discussed and let me know if I can assist you in the future.   Screening recommendations/referrals: Colonoscopy: Currently due, order placed to Gastroenterology  Recommended yearly ophthalmology/optometry visit for glaucoma screening and checkup Recommended yearly dental visit for hygiene and checkup  Vaccinations: Influenza vaccine: Up to date, next due fall 2022  Pneumococcal vaccine: Completed series  Tdap vaccine: Currently due, you may await and injury to receive as it will then be covered by medicare or you may receive at your local pharmacy Shingles vaccine: Completed series    Advanced directives: Please bring copies of your advanced medical directives so that we may scan them into your chart.   Conditions/risks identified: None   Next appointment: 02/06/2021 @ 3:30 PM with Pharmacist   Preventive Care 79 Years and Older, Male Preventive care refers to lifestyle choices and visits with your health care provider that can promote health and wellness. What does preventive care include?  A yearly physical exam. This is also called an annual well check.  Dental exams once or twice a year.  Routine eye exams. Ask your health care provider how often you should have your eyes checked.  Personal lifestyle choices, including:  Daily care of your teeth and gums.  Regular physical activity.  Eating a healthy diet.  Avoiding tobacco and drug use.  Limiting alcohol use.  Practicing safe sex.  Taking low doses of aspirin every day.  Taking vitamin and mineral supplements as recommended by your health care provider. What happens during an annual well check? The services and screenings done by your health care provider during your annual well check will depend on your age, overall health, lifestyle risk  factors, and family history of disease. Counseling  Your health care provider may ask you questions about your:  Alcohol use.  Tobacco use.  Drug use.  Emotional well-being.  Home and relationship well-being.  Sexual activity.  Eating habits.  History of falls.  Memory and ability to understand (cognition).  Work and work Statistician. Screening  You may have the following tests or measurements:  Height, weight, and BMI.  Blood pressure.  Lipid and cholesterol levels. These may be checked every 5 years, or more frequently if you are over 5 years old.  Skin check.  Lung cancer screening. You may have this screening every year starting at age 59 if you have a 30-pack-year history of smoking and currently smoke or have quit within the past 15 years.  Fecal occult blood test (FOBT) of the stool. You may have this test every year starting at age 73.  Flexible sigmoidoscopy or colonoscopy. You may have a sigmoidoscopy every 5 years or a colonoscopy every 10 years starting at age 46.  Prostate cancer screening. Recommendations will vary depending on your family history and other risks.  Hepatitis C blood test.  Hepatitis B blood test.  Sexually transmitted disease (STD) testing.  Diabetes screening. This is done by checking your blood sugar (glucose) after you have not eaten for a while (fasting). You may have this done every 1-3 years.  Abdominal aortic aneurysm (AAA) screening. You may need this if you are a current or former smoker.  Osteoporosis. You may be screened starting at age 58 if you are at high risk. Talk with your health care provider about your test results, treatment options,  and if necessary, the need for more tests. Vaccines  Your health care provider may recommend certain vaccines, such as:  Influenza vaccine. This is recommended every year.  Tetanus, diphtheria, and acellular pertussis (Tdap, Td) vaccine. You may need a Td booster every 10  years.  Zoster vaccine. You may need this after age 14.  Pneumococcal 13-valent conjugate (PCV13) vaccine. One dose is recommended after age 1.  Pneumococcal polysaccharide (PPSV23) vaccine. One dose is recommended after age 46. Talk to your health care provider about which screenings and vaccines you need and how often you need them. This information is not intended to replace advice given to you by your health care provider. Make sure you discuss any questions you have with your health care provider. Document Released: 12/13/2015 Document Revised: 08/05/2016 Document Reviewed: 09/17/2015 Elsevier Interactive Patient Education  2017 [employer] Prevention in the Home Falls can cause injuries. They can happen to people of all ages. There are many things you can do to make your home safe and to help prevent falls. What can I do on the outside of my home?  Regularly fix the edges of walkways and driveways and fix any cracks.  Remove anything that might make you trip as you walk through a door, such as a raised step or threshold.  Trim any bushes or trees on the path to your home.  Use bright outdoor lighting.  Clear any walking paths of anything that might make someone trip, such as rocks or tools.  Regularly check to see if handrails are loose or broken. Make sure that both sides of any steps have handrails.  Any raised decks and porches should have guardrails on the edges.  Have any leaves, snow, or ice cleared regularly.  Use sand or salt on walking paths during winter.  Clean up any spills in your garage right away. This includes oil or grease spills. What can I do in the bathroom?  Use night lights.  Install grab bars by the toilet and in the tub and shower. Do not use towel bars as grab bars.  Use non-skid mats or decals in the tub or shower.  If you need to sit down in the shower, use a plastic, non-slip stool.  Keep the floor dry. Clean up any water that  spills on the floor as soon as it happens.  Remove soap buildup in the tub or shower regularly.  Attach bath mats securely with double-sided non-slip rug tape.  Do not have throw rugs and other things on the floor that can make you trip. What can I do in the bedroom?  Use night lights.  Make sure that you have a light by your bed that is easy to reach.  Do not use any sheets or blankets that are too big for your bed. They should not hang down onto the floor.  Have a firm chair that has side arms. You can use this for support while you get dressed.  Do not have throw rugs and other things on the floor that can make you trip. What can I do in the kitchen?  Clean up any spills right away.  Avoid walking on wet floors.  Keep items that you use a lot in easy-to-reach places.  If you need to reach something above you, use a strong step stool that has a grab bar.  Keep electrical cords out of the way.  Do not use floor polish or wax that makes floors slippery. If  you must use wax, use non-skid floor wax.  Do not have throw rugs and other things on the floor that can make you trip. What can I do with my stairs?  Do not leave any items on the stairs.  Make sure that there are handrails on both sides of the stairs and use them. Fix handrails that are broken or loose. Make sure that handrails are as long as the stairways.  Check any carpeting to make sure that it is firmly attached to the stairs. Fix any carpet that is loose or worn.  Avoid having throw rugs at the top or bottom of the stairs. If you do have throw rugs, attach them to the floor with carpet tape.  Make sure that you have a light switch at the top of the stairs and the bottom of the stairs. If you do not have them, ask someone to add them for you. What else can I do to help prevent falls?  Wear shoes that:  Do not have high heels.  Have rubber bottoms.  Are comfortable and fit you well.  Are closed at the  toe. Do not wear sandals.  If you use a stepladder:  Make sure that it is fully opened. Do not climb a closed stepladder.  Make sure that both sides of the stepladder are locked into place.  Ask someone to hold it for you, if possible.  Clearly mark and make sure that you can see:  Any grab bars or handrails.  First and last steps.  Where the edge of each step is.  Use tools that help you move around (mobility aids) if they are needed. These include:  Canes.  Walkers.  Scooters.  Crutches.  Turn on the lights when you go into a dark area. Replace any light bulbs as soon as they burn out.  Set up your furniture so you have a clear path. Avoid moving your furniture around.  If any of your floors are uneven, fix them.  If there are any pets around you, be aware of where they are.  Review your medicines with your doctor. Some medicines can make you feel dizzy. This can increase your chance of falling. Ask your doctor what other things that you can do to help prevent falls. This information is not intended to replace advice given to you by your health care provider. Make sure you discuss any questions you have with your health care provider. Document Released: 09/12/2009 Document Revised: 04/23/2016 Document Reviewed: 12/21/2014 Elsevier Interactive Patient Education  2017 [employer].

## 2021-02-06 ENCOUNTER — Ambulatory Visit (INDEPENDENT_AMBULATORY_CARE_PROVIDER_SITE_OTHER): Payer: PPO | Admitting: Pharmacist

## 2021-02-06 DIAGNOSIS — E119 Type 2 diabetes mellitus without complications: Secondary | ICD-10-CM

## 2021-02-06 DIAGNOSIS — E039 Hypothyroidism, unspecified: Secondary | ICD-10-CM

## 2021-02-06 DIAGNOSIS — I10 Essential (primary) hypertension: Secondary | ICD-10-CM

## 2021-02-06 DIAGNOSIS — E785 Hyperlipidemia, unspecified: Secondary | ICD-10-CM

## 2021-02-06 NOTE — Progress Notes (Signed)
Chronic Care Management Pharmacy Note  02/07/2021 Name:  Troy Howard MRN:  [id number] DOB:  1941/06/18  Subjective: Troy Howard is an 80 y.o. year old male who is a primary patient of Laurey Morale, MD.  The CCM team was consulted for assistance with disease management and care coordination needs.    Engaged with patient by telephone for follow up visit in response to provider referral for pharmacy case management and/or care coordination services.   Consent to Services:  The patient was given information about Chronic Care Management services, agreed to services, and gave verbal consent prior to initiation of services.  Please see initial visit note for detailed documentation.   Patient Care Team: Laurey Morale, MD as PCP - General (Family Medicine) Viona Gilmore, [facility] as Pharmacist (Pharmacist)  Recent office visits: 01/30/21 Ofilia Neas, LPN: Patient presented for medicare annual wellness visit. Referral placed for gastroenterology.   10/28/20 [name], MD: Patient presented for annual exam. A1c increased to 8.7%. Prescribed Januvia 100 mg daily.  Recent consult visits: 11/12/20 Sullivan Lone, MD (oncology): Patient presented for monoclonal paraproteinemia follow up.   09/03/20 Etter Sjogren (dermatology): Patient presented for rosacea follow up. Unable to access notes.  08/28/20 Suella Broad (PT): Unable to access notes.  07/18/20 Timoteo Ace (nephrology): Patient presented for CKD follow up. Unable to access notes.   Hospital visits: 10/13/20 Patient presented to urgent care for upper respiratory infection.   Objective:  Lab Results  Component Value Date   CREATININE 1.78 (H) 11/01/2020   BUN 29 (H) 11/01/2020   GFR 47.18 (L) 10/24/2019   GFRNONAA 39 (L) 11/01/2020   GFRAA 46 (L) 10/28/2020   NA 135 11/01/2020   K 4.5 11/01/2020   CALCIUM 9.0 11/01/2020   CO2 22 11/01/2020    Lab Results  Component Value Date/Time   HGBA1C 8.7 (H)  10/28/2020 01:37 PM   HGBA1C 7.0 (H) 10/24/2019 02:19 PM   GFR 47.18 (L) 10/24/2019 02:19 PM   GFR 53.23 (L) 10/18/2018 07:42 AM   MICROALBUR 7.3 (H) 08/14/2014 10:11 AM   MICROALBUR 1.1 03/22/2012 09:32 AM    Last diabetic Eye exam:  Lab Results  Component Value Date/Time   HMDIABEYEEXA No Retinopathy 09/16/2016 09:15 AM    Last diabetic Foot exam: No results found for: HMDIABFOOTEX   Lab Results  Component Value Date   CHOL 141 10/28/2020   HDL 20 (L) 10/28/2020   LDLCALC  10/28/2020     Comment:     . LDL cholesterol not calculated. Triglyceride levels greater than 400 mg/dL invalidate calculated LDL results. . Reference range: <100 . Desirable range <100 mg/dL for primary prevention;   <70 mg/dL for patients with CHD or diabetic patients  with > or = 2 CHD risk factors. Marland Kitchen LDL-C is now calculated using the Martin-Hopkins  calculation, which is a validated novel method providing  better accuracy than the Friedewald equation in the  estimation of LDL-C.  Cresenciano Genre et al. Annamaria Helling. 3893;734(28): 2061-2068  (http://education.[employer].com/faq/FAQ164)    LDLDIRECT 65.0 10/24/2019   TRIG 476 (H) 10/28/2020   CHOLHDL 7.1 (H) 10/28/2020    Hepatic Function Latest Ref Rng & Units 11/01/2020 10/28/2020 11/07/2019  Total Protein 6.5 - 8.1 g/dL 8.1 8.1 7.6  Albumin 3.5 - 5.0 g/dL 3.5 - 4.0  AST 15 - 41 U/L 13(L) 16 16  ALT 0 - 44 U/L $Remo'19 25 22  'uowEg$ Alk Phosphatase 38 - 126 U/L 87 - 64  Total  Bilirubin 0.3 - 1.2 mg/dL 0.5 0.7 0.6  Bilirubin, Direct 0.0 - 0.2 mg/dL - 0.3(H) -    Lab Results  Component Value Date/Time   TSH 0.52 10/28/2020 01:37 PM   TSH 1.69 10/24/2019 02:19 PM   FREET4 1.4 10/28/2020 01:37 PM   FREET4 0.99 10/24/2019 02:19 PM    CBC Latest Ref Rng & Units 10/28/2020 10/24/2019 11/08/2018  WBC 3.8 - 10.8 Thousand/uL 6.4 7.1 6.3  Hemoglobin 13.2 - 17.1 g/dL 13.7 14.0 14.1  Hematocrit 38.5 - 50.0 % 40.4 41.1 41.5  Platelets 140 - 400 Thousand/uL 230  214.0 182    Lab Results  Component Value Date/Time   VD25OH 23.50 (L) 02/22/2018 04:41 PM   VD25OH 67 10/22/2009 08:53 PM    Clinical ASCVD: No  The ASCVD Risk score Mikey Bussing DC Jr., et al., 2013) failed to calculate for the following reasons:   The systolic blood pressure is missing    Depression screen [facility] 2/9 01/30/2021 10/28/2020 10/24/2019  Decreased Interest 0 0 0  Down, Depressed, Hopeless 0 0 0  PHQ - 2 Score 0 0 0      Social History   Tobacco Use  Smoking Status Never Smoker  Smokeless Tobacco Never Used   BP Readings from Last 3 Encounters:  11/12/20 (!) 157/69  10/28/20 126/70  10/13/20 (!) 173/88   Pulse Readings from Last 3 Encounters:  11/12/20 80  10/28/20 79  10/13/20 68   Wt Readings from Last 3 Encounters:  11/12/20 225 lb 3.2 oz (102.2 kg)  10/28/20 222 lb 9.6 oz (101 kg)  11/14/19 226 lb 1.6 oz (102.6 kg)    Assessment/Interventions: Review of patient past medical history, allergies, medications, health status, including review of consultants reports, laboratory and other test data, was performed as part of comprehensive evaluation and provision of chronic care management services.   SDOH:  (Social Determinants of Health) assessments and interventions performed: Yes   CCM Care Plan  No Known Allergies  Medications Reviewed Today    Reviewed by Launa Grill, LPN (Licensed Practical Nurse) on 01/30/21 at 1533  Med List Status: <None>  Medication Order Taking? Sig Documenting Provider Last Dose Status Informant  aspirin-acetaminophen-caffeine (EXCEDRIN MIGRAINE) 009-381-82 MG tablet [id number] Yes Take 1 tablet by mouth every 8 (eight) hours as needed for migraine. [provider] Taking Active Self  Blood Glucose Monitoring Suppl (ONE TOUCH ULTRA 2) w/Device KIT [id number] Yes Use to check blood sugars once daily. Laurey Morale, MD Taking Active   CHELATED ZINC PO [id number] Yes Take 1 capsule by mouth daily. [provider]  Taking Active Self  Cholecalciferol (VITAMIN D3) 2000 units capsule [id number] Yes Take 2,000 Units by mouth daily.  [provider] Taking Active Self  Cyanocobalamin (VITAMIN B 12 PO) [id number] Yes Take 2,500 mcg by mouth daily.  [provider] Taking Active Self  esomeprazole (NEXIUM) 40 MG capsule [id number] Yes TAKE 1 CAPSULE BY MOUTH EVERY DAY Laurey Morale, MD Taking Active   FOLIC ACID PO [id number] Yes Take 800 mcg by mouth daily.  [provider] Taking Active Self  glipiZIDE (GLUCOTROL) 10 MG tablet [id number] Yes Take 1 tablet (10 mg total) by mouth 2 (two) times daily before a meal. Laurey Morale, MD Taking Active   glucose blood test strip [id number] Yes USE ONCE A DAY TO TEST BLOOD SUGAR Laurey Morale, MD Taking Active   [facility] GARLIC EXTRACT PO [id number] Yes Take 800 mg by mouth daily. [provider] Taking Active Self  Levomefolate Glucosamine (METHYLFOLATE PO) [id number] Yes Take 1 tablet by mouth daily. [provider] Taking Active Self  losartan (COZAAR) 100 MG tablet [id number] Yes Take 1 tablet (100 mg total) by mouth daily. Laurey Morale, MD Taking Active   Omega-3 Fatty Acids (FISH OIL) 1000 MG CAPS [id number] Yes Take by mouth daily.  [provider] Taking Active Self  OneTouch Delica Lancets 34L [place] [id number] Yes USE ONCE A DAY WHEN CHECKING BLOOD SUGAR Laurey Morale, MD Taking Active   pyridOXINE (VITAMIN B-6) 100 MG tablet [id number] Yes Take 100 mg by mouth daily. [provider] Taking Active Self  sitaGLIPtin (JANUVIA) 100 MG tablet [id number] Yes Take 1 tablet (100 mg total) by mouth daily. Laurey Morale, MD Taking Active   SYNTHROID 137 MCG tablet [id number] Yes Take 1 tablet (137 mcg total) by mouth daily before breakfast. Laurey Morale, MD Taking Active   tamsulosin [facility]) 0.4 MG CAPS capsule [id number] Yes Take 2 capsules (0.8 mg total) by mouth daily. Laurey Morale, MD Taking Active   temazepam  (RESTORIL) 30 MG capsule [id number] Yes Take 1 capsule (30 mg total) by mouth at bedtime as needed for sleep. Laurey Morale, MD Taking Active           Patient Active Problem List   Diagnosis Date Noted  . Dyslipidemia 02/22/2018  . Hypothyroidism 02/22/2018  . Diabetes mellitus without complication ([place]) 49/70/2637  . 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 PROSTATE CANCER, HX OF 11/13/2010  . GERD 01/17/2008  . ACUT PEPTC ULCR UNS SITE W/HEM W/O MENTION OBST 01/17/2008  . ERECTILE DYSFUNCTION 11/23/2007  . HYPERTENSION, BENIGN 11/23/2007  . PROSTATE SPECIFIC ANTIGEN, ELEVATED 11/23/2007  . MIGRAINE HEADACHE 06/13/2007    Immunization History  Administered Date(s) Administered  . Influenza Whole 09/05/2010  . Influenza, High Dose Seasonal PF 08/20/2015, 09/08/2017, 08/19/2018, 08/28/2019, 08/28/2019  . Influenza,inj,Quad PF,6+ Mos 08/14/2014  . Influenza-Unspecified 09/16/2016, 09/08/2017, 09/23/2020  . Moderna Sars-Covid-2 Vaccination 01/05/2020, 01/31/2020, 09/24/2020  . Pneumococcal Conjugate-13 08/14/2014  . Pneumococcal Polysaccharide-23 11/23/2007  . Zoster 11/19/2009  . Zoster Recombinat (Shingrix) 10/17/2018, 01/28/2019    Conditions to be addressed/monitored:  Hypertension, Hyperlipidemia, Diabetes, GERD, Chronic Kidney Disease, Hypothyroidism and migraine, nocturia  Care Plan : [place]  Updates made by Viona Gilmore, [place] since 02/07/2021 12:00 AM    Problem: Problem: Hypertension, Hyperlipidemia, Diabetes, GERD, Chronic Kidney Disease, Hypothyroidism and migraine, nocturia     Long-Range Goal: Patient-Specific Goal   Start Date: 02/06/2021  Expected End Date: 02/06/2022  This Visit's Progress: On track  Priority: High  Note:   Current Barriers:  . Unable to achieve control of diabetes and cholesterol   Pharmacist Clinical Goal(s):  Marland Kitchen Over the next 180 days, patient will achieve adherence to monitoring guidelines and medication adherence to achieve  therapeutic efficacy . achieve control of diabetes as evidenced by A1c  through collaboration with PharmD and provider.   Interventions: . 1:1 collaboration with Laurey Morale, MD regarding development and update of comprehensive plan of care as evidenced by provider attestation and co-signature . Inter-disciplinary care team collaboration (see longitudinal plan of care) . Comprehensive medication review performed; medication list updated in electronic medical record  Hypertension (BP goal <140/90) -Uncontrolled -Current treatment: . losartan $RemoveBe'100mg'IBUyDSqDE$ , 1 tablet daily -Medications previously tried: Benicar  -Current home readings: does not check at home -Current dietary habits: he has lost 25 lbs over last year and a half  and has been able to cut down on fattty foods and sweets -Current exercise habits: patients states he walks 3 miles/ day -Denies hypotensive/hypertensive symptoms -Educated on Importance of home blood pressure monitoring; -Counseled to monitor BP at home weekly, document, and provide log at future appointments -Recommended to continue current medication  Hyperlipidemia: (LDL goal < 100) -Uncontrolled -Current treatment: . 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 Januvia 100 mg 1 tablet daily   glipizide 10mg , 1 tablet twice daily before meals -Medications previously tried:  metformin (kidney function) -Current home glucose readings . fasting glucose: could not provide, "all good and nothing high or low" . 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 levothyroxine 190mcg, 1 tablet once daily before breakfast  -Medications previously tried: none  -Recommended to continue current medication  GERD (Goal: minimize symptoms of acid reflux/heartburn) -Controlled -Current treatment  . esomeprazole 40mg , 1 capsule daily (around noon time) -Medications previously tried: none  -Recommended to continue current medication  Nocturia (Goal: minimize symptoms) -Controlled -Current treatment  . tamsulosin 0.4mg , 2 capsules daily -Medications previously tried: none -Recommended to continue current medication  Vitamin D deficiency (Goal: vitamin D 30-100) -Uncontrolled -Current treatment  . Vitamin D 2000 units daily -Medications previously tried: none  -Recommended to continue current medication Recommended repeat vitamin D level  Vitamin B12 deficiency (Goal: [phone]) -Controlled -Current treatment  . Vitamin B12 2500 mcg daily -Medications previously tried: none -Recommended to continue current medication  Insomnia (Goal: improve quality and quantity of  sleep) -Controlled -Current treatment  . Temazepam 30 mg 1 capsule at bedtime as needed -Medications previously tried: none -Counseled on practicing good sleep hygiene by setting a sleep schedule and maintaining it, avoid excessive napping, following a nightly routine, avoiding screen time for 30-60 minutes before going to bed, and making the bedroom a cool, quiet and dark space Patient is not taking every night  Health Maintenance -Vaccine gaps: tetanus -Current therapy:  . Folic acid daily . Vitamin B6 100 mg daily . Zinc daily . Garlic daily . Excedrin Migraine- takes as needed for occasional migraine -Educated on Cost vs benefit of each product must be carefully weighed by individual consumer -Patient is satisfied with current therapy and denies issues -Recommended to continue current medication  Patient Goals/Self-Care Activities . Over the next 180 days, patient will:  -  take medications as prescribed check glucose daily, document, and provide at future appointments check blood pressure weekly, document, and provide at future appointments  Follow Up Plan: Telephone follow up appointment with care management team member scheduled for: 6 months       Medication Assistance: None required.  Patient affirms current coverage meets needs.  Patient's preferred pharmacy is:  [id number] - [place], [place] [address] [place] [address] Phone: [phone] Fax: [phone]  Uses pill box? Yes Pt endorses 100% compliance  We discussed: Current pharmacy is preferred with insurance plan and patient is satisfied with pharmacy services Patient decided to: Continue current medication management strategy  Care Plan and Follow Up Patient Decision:  Patient agrees to Care Plan and Follow-up.  Plan: Telephone follow up appointment with care management team member scheduled for:  6 months  [name], PharmD [place] Pharmacist [place] at [place] [phone]

## 2021-02-07 NOTE — Patient Instructions (Addendum)
Hi Dj,   It was great to get to meet you over the telephone! Below is a summary of some of the topics we discussed. I'll reach out to you once I hear back from Dr. Sarajane Jews about the medication we discussed. I also attached some information about lowering triglycerides through diet that may be helpful.   Please reach out to me if you have any questions or need anything before our follow up!  Best, Maddie  [name], PharmD, [facility]  Visit Information  Goals Addressed   None    Patient Care Plan: [facility] Pharmacy Care Plan    Problem Identified: Problem: Hypertension, Hyperlipidemia, Diabetes, GERD, Chronic Kidney Disease, Hypothyroidism and migraine, nocturia     Long-Range Goal: Patient-Specific Goal   Start Date: 02/06/2021  Expected End Date: 02/06/2022  This Visit's Progress: On track  Priority: High  Note:   Current Barriers:  . Unable to achieve control of diabetes and cholesterol   Pharmacist Clinical Goal(s):  Marland Kitchen Over the next 180 days, patient will achieve adherence to monitoring guidelines and medication adherence to achieve therapeutic efficacy . achieve control of diabetes as evidenced by A1c  through collaboration with PharmD and provider.   Interventions: . 1:1 collaboration with Laurey Morale, MD regarding development and update of comprehensive plan of care as evidenced by provider attestation and co-signature . Inter-disciplinary care team collaboration (see longitudinal plan of care) . Comprehensive medication review performed; medication list updated in electronic medical record  Hypertension (BP goal <140/90) -Uncontrolled -Current treatment: . losartan 100mg , 1 tablet daily -Medications previously tried: Benicar  -Current home readings: does not check at home -Current dietary habits: he has lost 25 lbs over last year and a half and has been able to cut down on fattty foods and  sweets -Current exercise habits: patients states he walks 3 miles/ day -Denies hypotensive/hypertensive symptoms -Educated on Importance of home blood pressure monitoring; -Counseled to monitor BP at home weekly, document, and provide log at future appointments -Recommended to continue current medication  Hyperlipidemia: (LDL goal < 100) -Uncontrolled -Current treatment: . 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 Januvia 100 mg 1 tablet daily   glipizide 10mg , 1 tablet twice daily before meals -Medications previously tried: metformin (kidney function) -Current home glucose readings . fasting glucose: could not provide, "all good and nothing high or low" . 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 levothyroxine 150mcg, 1 tablet once daily before breakfast  -Medications previously tried: none  -Recommended to continue current medication  GERD (Goal: minimize symptoms of acid reflux/heartburn) -Controlled -Current treatment  . esomeprazole 40mg , 1 capsule daily (around noon time) -Medications previously tried: none  -Recommended to continue current medication  Nocturia (Goal: minimize symptoms) -Controlled -Current treatment  . tamsulosin 0.4mg , 2 capsules daily -Medications previously tried: none -Recommended to continue current medication  Vitamin D deficiency (Goal: vitamin D 30-100) -Uncontrolled -Current treatment  . Vitamin D 2000 units daily -Medications previously tried: none  -Recommended to continue current medication Recommended repeat vitamin D level  Vitamin B12 deficiency (Goal: [phone]) -Controlled -Current treatment  . Vitamin B12 2500 mcg daily -Medications previously tried: none -Recommended to continue current medication  Insomnia (Goal: improve quality and quantity of sleep) -Controlled -Current treatment  . Temazepam 30 mg 1 capsule at bedtime as needed -Medications previously tried: none -Counseled on practicing good sleep hygiene by setting a sleep schedule and maintaining it, avoid excessive napping, following a nightly routine, avoiding screen time for 30-60 minutes before going to bed, and making the bedroom a cool, quiet and dark space Patient is not taking every night  Health Maintenance -Vaccine gaps: tetanus -Current therapy:  . Folic acid daily . Vitamin B6 100 mg daily . Zinc daily . Garlic daily . Excedrin Migraine- takes as needed for occasional migraine -Educated on Cost vs benefit of each product must be carefully weighed by individual consumer -Patient is satisfied with current therapy and denies issues -Recommended to continue  current medication  Patient Goals/Self-Care Activities . Over the next 180 days, patient will:  - take medications as prescribed check glucose daily, document, and provide at future appointments check blood pressure weekly, document, and provide at future appointments  Follow Up Plan: Telephone follow up appointment with care management team member scheduled for: 6 months       Patient verbalizes understanding of instructions provided today and agrees to view in [place].  Telephone follow up appointment with pharmacy team member scheduled for: 6 months  Viona Gilmore, [facility]  High Triglycerides Eating Plan Triglycerides are a type of fat in the blood. High levels of triglycerides can increase your risk of heart disease and stroke. If your triglyceride levels are high, choosing the right foods can help lower your triglycerides and keep your heart healthy. Work with your health care provider or a diet and nutrition specialist (dietitian) to develop an eating plan that is right for you. What are tips for following this plan? General guidelines  Lose weight, if you are overweight. For most people, losing 5-10 lbs (2-5 kg) helps lower triglyceride levels. A weight-loss plan may include. ? 30 minutes of exercise at least 5 days a week. ? Reducing the amount of calories, sugar, and fat you eat.  Eat a wide variety of fresh fruits, vegetables, and whole grains. These foods are high in fiber.  Eat foods that contain healthy fats, such as fatty fish, nuts, seeds, and olive oil.  Avoid foods that are high in added sugar, added salt (sodium), saturated fat, and trans fat.  Avoid low-fiber, refined carbohydrates such as white bread, crackers, noodles, and white rice.  Avoid foods with partially hydrogenated oils (trans fats), such as fried foods or stick margarine.  Limit alcohol intake to no more than 1 drink a day for nonpregnant  women and 2 drinks a day for men. One drink equals 12 oz of  beer, 5 oz of wine, or 1 oz of hard liquor. Your health care provider may recommend that you drink less depending on your overall health.   Reading food labels  Check food labels for the amount of saturated fat. Choose foods with no or very little saturated fat.  Check food labels for the amount of trans fat. Choose foods with no trans fat.  Check food labels for the amount of cholesterol. Choose foods low in cholesterol. Ask your dietitian how much cholesterol you should have each day.  Check food labels for the amount of sodium. Choose foods with less than 140 milligrams (mg) per serving. Shopping  Buy dairy products labeled as nonfat (skim) or low-fat (1%).  Avoid buying processed or prepackaged foods. These are often high in added sugar, sodium, and fat. Cooking  Choose healthy fats when cooking, such as olive oil or canola oil.  Cook foods using lower fat methods, such as baking, broiling, boiling, or grilling.  Make your own sauces, dressings, and marinades when possible, instead of buying them. Store-bought sauces, dressings, and marinades are often high in sodium and sugar. Meal planning  Eat more home-cooked food and less restaurant, buffet, and fast food.  Eat fatty fish at least 2 times each week. Examples of fatty fish include salmon, trout, mackerel, tuna, and herring.  If you eat whole eggs, do not eat more than 3 egg yolks per week. What foods are recommended? The items listed may not be a complete list. Talk with your dietitian about what dietary choices are best for you. Grains Whole wheat or whole grain breads, crackers, cereals, and pasta. Unsweetened oatmeal. Bulgur. Barley. Quinoa. Brown rice. Whole wheat flour tortillas. Vegetables Fresh or frozen vegetables. Low-sodium canned vegetables. Fruits All fresh, canned (in natural juice), or frozen fruits. Meats and other protein foods Skinless chicken or [place]. Ground chicken or [place]. Lean cuts of pork,  trimmed of fat. Fish and seafood, especially salmon, trout, and herring. Egg whites. Dried beans, peas, or lentils. Unsalted nuts or seeds. Unsalted canned beans. Natural peanut or almond butter. Dairy Low-fat dairy products. Skim or low-fat (1%) milk. Reduced fat (2%) and low-sodium cheese. Low-fat ricotta cheese. Low-fat cottage cheese. Plain, low-fat yogurt. Fats and oils Tub margarine without trans fats. Light or reduced-fat mayonnaise. Light or reduced-fat salad dressings. Avocado. Safflower, olive, sunflower, soybean, and canola oils. What foods are not recommended? The items listed may not be a complete list. Talk with your dietitian about what dietary choices are best for you. Grains White bread. White (regular) pasta. White rice. Cornbread. Bagels. Pastries. Crackers that contain trans fat. Vegetables Creamed or fried vegetables. Vegetables in a cheese sauce. Fruits Sweetened dried fruit. Canned fruit in syrup. Fruit juice. Meats and other protein foods Fatty cuts of meat. Ribs. Chicken wings. Berniece Salines. Sausage. Bologna. Salami. Chitterlings. Fatback. Hot dogs. Bratwurst. Packaged lunch meats. Dairy Whole or reduced-fat (2%) milk. Half-and-half. Cream cheese. Full-fat or sweetened yogurt. Full-fat cheese. Nondairy creamers. Whipped toppings. Processed cheese or cheese spreads. Cheese curds. Beverages Alcohol. Sweetened drinks, such as soda, lemonade, fruit drinks, or punches. Fats and oils Butter. Stick margarine. Lard. Shortening. Ghee. Bacon fat. Tropical oils, such as coconut, palm kernel, or palm oils. Sweets and desserts Corn syrup. Sugars. Honey. Molasses. Candy. Jam and jelly. Syrup. Sweetened cereals. Cookies. Pies. Cakes. Donuts. Muffins. Ice cream. Condiments Store-bought sauces, dressings, and marinades that are high in sugar, such  as ketchup and barbecue sauce. Summary  High levels of triglycerides can increase the risk of heart disease and stroke. Choosing the right  foods can help lower your triglycerides.  Eat plenty of fresh fruits, vegetables, and whole grains. Choose low-fat dairy and lean meats. Eat fatty fish at least twice a week.  Avoid processed and prepackaged foods with added sugar, sodium, saturated fat, and trans fat.  If you need suggestions or have questions about what types of food are good for you, talk with your health care provider or a dietitian. This information is not intended to replace advice given to you by your health care provider. Make sure you discuss any questions you have with your health care provider. Document Revised: 03/20/2020 Document Reviewed: 03/20/2020 Elsevier Patient Education  2021 [employer].

## 2021-02-11 ENCOUNTER — Other Ambulatory Visit: Payer: Self-pay

## 2021-02-11 ENCOUNTER — Other Ambulatory Visit (INDEPENDENT_AMBULATORY_CARE_PROVIDER_SITE_OTHER): Payer: PPO

## 2021-02-11 DIAGNOSIS — E119 Type 2 diabetes mellitus without complications: Secondary | ICD-10-CM

## 2021-02-11 LAB — POCT GLYCOSYLATED HEMOGLOBIN (HGB A1C): Hemoglobin A1C: 7.2 % — AB (ref 4.0–5.6)

## 2021-02-11 NOTE — Addendum Note (Signed)
Addended by: Elmer Picker on: 02/11/2021 08:56 AM   Modules accepted: Orders

## 2021-02-20 ENCOUNTER — Telehealth: Payer: Self-pay | Admitting: Family Medicine

## 2021-02-20 MED ORDER — ATORVASTATIN CALCIUM 10 MG PO TABS: 10.0000 mg | ORAL_TABLET | Freq: Every day | ORAL | 3 refills | Status: DC

## 2021-02-20 MED ORDER — CANAGLIFLOZIN 300 MG PO TABS: 300.0000 mg | ORAL_TABLET | Freq: Every day | ORAL | 11 refills | Status: DC

## 2021-02-20 NOTE — Telephone Encounter (Signed)
Done

## 2021-02-20 NOTE — Telephone Encounter (Signed)
Called patient to discuss plan for diabetes and cholesterol medications per discussion with PCP.  Based on CKD, Januvia was switched to Farxiga 300 mg daily. Based on elevated TGs and DM diagnosis, prescription for atorvastatin was sent in. Patient is aware of the medication changes. Discussed side effects with both and recommendations for drinking plenty of water with Invokana.  Plan to repeat A1c and lipid panel in 3 months and scheduled follow up appointment.

## 2021-02-22 ENCOUNTER — Telehealth: Payer: Self-pay | Admitting: Family Medicine

## 2021-02-22 DIAGNOSIS — E119 Type 2 diabetes mellitus without complications: Secondary | ICD-10-CM

## 2021-02-22 NOTE — Telephone Encounter (Signed)
-----   Message from Viona Gilmore, [facility] sent at 02/21/2021  1:25 PM EDT ----- Regarding: RE: Lipid panel and DM medication Ok I will set this up! Can you please put in the orders for a lipid panel and A1c?  Thank you, Maddie ----- Message ----- From: Laurey Morale, MD Sent: 02/21/2021  12:56 PM EDT To: Viona Gilmore, RPH Subject: RE: Lipid panel and DM medication              Just labs would be fine  ----- Message ----- From: Viona Gilmore, [facility] Sent: 02/20/2021  12:32 PM EDT To: Laurey Morale, MD Subject: RE: Lipid panel and DM medication              Ok thank you! Do you want him to follow up with you for a visit or just come to get his labs checked at that time? I will help him set up the appointment either way.  Thanks, Maddie ----- Message ----- From: Laurey Morale, MD Sent: 02/20/2021  12:18 PM EDT To: Viona Gilmore, RPH Subject: RE: Lipid panel and DM medication              These are good ideas. I have stopped the Januvia and have started him on Invokana 300 mg daily. I started him on Lipitor 10 mg daily. We will recheck lipids and A1c in 90 days  ----- Message ----- From: Viona Gilmore, [facility] Sent: 02/20/2021  11:48 AM EDT To: Laurey Morale, MD Subject: FW: Lipid panel and DM medication              Hi,  I just want to follow up on this message to see what you think about switching his Januvia to an SGTL2 inhibitor? I think it would be beneficial given his CKD. I know his A1c was much improved with the Januvia but let me know what you think. I would be happy to reach out to nephrology as well if you are on board with the idea.  Best, Maddie ----- Message ----- From: Viona Gilmore, [facility] Sent: 02/07/2021   2:44 PM EDT To: Laurey Morale, MD Subject: Lipid panel and DM medication                  Hi,  I spoke with Mr. Albanese and got him scheduled for a lab appointment to repeat his A1c per your note for next week after starting the [place]. He has  made a lot of diet changes and I would expect his triglycerides to change as well with the A1c change. Can you put in an order for the A1c and a repeat lipid panel? He would probably benefit from a statin or Vascepa with where his triglycerides are currently.  Also, I was looking at his kidney function and technically with his GFR of < 45 he should be on a max of Januvia 50 mg/day. Instead of reducing this, could we think about trying an SGLT2 instead? The cost would be about the same and he does have CKD so there would be a benefit there. I don't mind reaching out to his nephrologist if you agree to make sure he is on board as well with the change. If so, I can call the patient back to move the A1c recheck as well.  Let me know!  Thanks, Maddie

## 2021-02-22 NOTE — Telephone Encounter (Signed)
Done

## 2021-02-25 ENCOUNTER — Inpatient Hospital Stay: Payer: PPO | Attending: Hematology

## 2021-02-25 ENCOUNTER — Other Ambulatory Visit: Payer: Self-pay

## 2021-02-25 DIAGNOSIS — D472 Monoclonal gammopathy: Secondary | ICD-10-CM

## 2021-02-25 LAB — CBC WITH DIFFERENTIAL/PLATELET
Abs Immature Granulocytes: 0.01 10*3/uL (ref 0.00–0.07)
Basophils Absolute: 0 10*3/uL (ref 0.0–0.1)
Basophils Relative: 1 %
Eosinophils Absolute: 0.1 10*3/uL (ref 0.0–0.5)
Eosinophils Relative: 2 %
HCT: 39.6 % (ref 39.0–52.0)
Hemoglobin: 13.4 g/dL (ref 13.0–17.0)
Immature Granulocytes: 0 %
Lymphocytes Relative: 41 %
Lymphs Abs: 1.6 10*3/uL (ref 0.7–4.0)
MCH: 30.9 pg (ref 26.0–34.0)
MCHC: 33.8 g/dL (ref 30.0–36.0)
MCV: 91.2 fL (ref 80.0–100.0)
Monocytes Absolute: 0.4 10*3/uL (ref 0.1–1.0)
Monocytes Relative: 11 %
Neutro Abs: 1.7 10*3/uL (ref 1.7–7.7)
Neutrophils Relative %: 45 %
Platelets: 189 10*3/uL (ref 150–400)
RBC: 4.34 MIL/uL (ref 4.22–5.81)
RDW: 13.2 % (ref 11.5–15.5)
WBC: 3.8 10*3/uL — ABNORMAL LOW (ref 4.0–10.5)
nRBC: 0 % (ref 0.0–0.2)

## 2021-02-25 LAB — CMP (CANCER CENTER ONLY)
ALT: 19 U/L (ref 0–44)
AST: 13 U/L — ABNORMAL LOW (ref 15–41)
Albumin: 3.4 g/dL — ABNORMAL LOW (ref 3.5–5.0)
Alkaline Phosphatase: 76 U/L (ref 38–126)
Anion gap: 11 (ref 5–15)
BUN: 30 mg/dL — ABNORMAL HIGH (ref 8–23)
CO2: 23 mmol/L (ref 22–32)
Calcium: 8.8 mg/dL — ABNORMAL LOW (ref 8.9–10.3)
Chloride: 103 mmol/L (ref 98–111)
Creatinine: 1.89 mg/dL — ABNORMAL HIGH (ref 0.61–1.24)
GFR, Estimated: 36 mL/min — ABNORMAL LOW (ref >60)
Glucose, Bld: 185 mg/dL — ABNORMAL HIGH (ref 70–99)
Potassium: 4.4 mmol/L (ref 3.5–5.1)
Sodium: 137 mmol/L (ref 135–145)
Total Bilirubin: 0.6 mg/dL (ref 0.3–1.2)
Total Protein: 8.6 g/dL — ABNORMAL HIGH (ref 6.5–8.1)

## 2021-02-25 LAB — VITAMIN D 25 HYDROXY (VIT D DEFICIENCY, FRACTURES): Vit D, 25-Hydroxy: 99.17 ng/mL (ref 30–100)

## 2021-02-26 LAB — KAPPA/LAMBDA LIGHT CHAINS
Kappa free light chain: 16.4 mg/L (ref 3.3–19.4)
Kappa, lambda light chain ratio: 0.25 — ABNORMAL LOW (ref 0.26–1.65)
Lambda free light chains: 64.4 mg/L — ABNORMAL HIGH (ref 5.7–26.3)

## 2021-02-27 ENCOUNTER — Encounter: Payer: Self-pay | Admitting: Gastroenterology

## 2021-02-27 LAB — MULTIPLE MYELOMA PANEL, SERUM
Albumin SerPl Elph-Mcnc: 3.6 g/dL (ref 2.9–4.4)
Albumin/Glob SerPl: 0.9 (ref 0.7–1.7)
Alpha 1: 0.3 g/dL (ref 0.0–0.4)
Alpha2 Glob SerPl Elph-Mcnc: 1 g/dL (ref 0.4–1.0)
B-Globulin SerPl Elph-Mcnc: 0.9 g/dL (ref 0.7–1.3)
Gamma Glob SerPl Elph-Mcnc: 2 g/dL — ABNORMAL HIGH (ref 0.4–1.8)
Globulin, Total: 4.2 g/dL — ABNORMAL HIGH (ref 2.2–3.9)
IgA: 71 mg/dL (ref 61–437)
IgG (Immunoglobin G), Serum: 2804 mg/dL — ABNORMAL HIGH (ref 603–1613)
IgM (Immunoglobulin M), Srm: 30 mg/dL (ref 15–143)
M Protein SerPl Elph-Mcnc: 1.9 g/dL — ABNORMAL HIGH
Total Protein ELP: 7.8 g/dL (ref 6.0–8.5)

## 2021-03-03 NOTE — Progress Notes (Signed)
HEMATOLOGY/ONCOLOGY CLINIC NOTE  Date of Service: 03/03/2021  Patient Care Team: Troy Morale, MD as PCP - General (Family Medicine) Troy Howard, [facility] as Pharmacist (Pharmacist)  CHIEF COMPLAINTS/PURPOSE OF CONSULTATION:  Positive M-Spike   HISTORY OF PRESENTING ILLNESS:   Troy Howard 80 y.o. male is a here because of positive M-spike. The patient was referred by nephrologist Dr. Hollie Howard. The patient presents to the clinic today accompanied by his wife.  He is being seen by nephrologist who pursued further work up to why his Creatinine has increased. Upon workup his M-protein was found to be 0.8g/dl.  He has had HTN and DM for many years that can affects his Kidney function and was thought to be the likely cause of his CKD.   Today his wife notes he has had muscle pain, muscle mass loss and fatigue.   He has HTN, DM, GERD, Hypothyroid, Hyperlipidemia. He was recently taken off statins, meloxicam, ASA and metformin. He takes Nexium for her GERD and has been on for many years.   On review of symptoms, pt notes purposeful weight loss, muscle loss and muscle pain. He also has leg pain from b/l knees to anterior shin.  Interval History:   Troy Howard returns today regarding his M-spike. We are joined today by his wife, Troy Howard. The patient's last visit with [place] was on 11/12/2020. The pt reports that he is doing well overall.  The pt reports some increased fatigue  Lab results 02/25/2021 of CBC w/diff and CMP is as follows: all values are WNL except for WBC of 3.8K, Glucose of 185, BUN of 30, Creatinine of 1.89, Calcium of 8.8, Total Protein of 8.6, Albumin of 3.4, AST of 13, GFR est of 36. 02/25/2021 Lambda free light chain of 64.4, Kappa Lambda ratio of 0.25. 02/25/2021 Vitamin D Hydroxy 25 of 99.17. 02/25/2021 MMP WNL except IgG of 2804, Gamma Glob of 2.0, m protein of 1.9 increased from 1.4  On review of systems, pt reports no focal bone pains.   MEDICAL  HISTORY:  Past Medical History:  Diagnosis Date  . Arthritis    back  . Cancer [facility])    prostate, sees Dr. Raynelle Howard   . Diabetes mellitus   . Erectile dysfunction   . GERD (gastroesophageal reflux disease)   . Hyperlipidemia   . Hypertension   . Migraines   . Peptic ulcer     SURGICAL HISTORY: Past Surgical History:  Procedure Laterality Date  . APPENDECTOMY  1950  . COLONOSCOPY  11/14/2015   per Dr. Fuller Plan, adenomatous polyps, repeat in 5 yrs   . HERNIA REPAIR     umbilical   . MENISCUS REPAIR Left   . PROSTATE SURGERY  2009   radical prostatectomy  . SPINE SURGERY     L4-L5 fusion per Dr. Glenna Fellows   . TONSILLECTOMY AND ADENOIDECTOMY  1954    SOCIAL HISTORY: Social History   Socioeconomic History  . Marital status: Married    Spouse name: Not on file  . Number of children: Not on file  . Years of education: Not on file  . Highest education level: Not on file  Occupational History  . Not on file  Tobacco Use  . Smoking status: Never Smoker  . Smokeless tobacco: Never Used  Vaping Use  . Vaping Use: Never used  Substance and Sexual Activity  . Alcohol use: Not Currently    Comment: rare  . Drug use: No  .  Sexual activity: Not on file  Other Topics Concern  . Not on file  Social History Narrative  . Not on file   Social Determinants of Health   Financial Resource Strain: Low Risk   . Difficulty of Paying Living Expenses: Not hard at all  Food Insecurity: No Food Insecurity  . Worried About Charity fundraiser in the Last Year: Never true  . Ran Out of Food in the Last Year: Never true  Transportation Needs: No Transportation Needs  . Lack of Transportation (Medical): No  . Lack of Transportation (Non-Medical): No  Physical Activity: Inactive  . Days of Exercise per Week: 0 days  . Minutes of Exercise per Session: 0 min  Stress: No Stress Concern Present  . Feeling of Stress : Not at all  Social Connections: Moderately Integrated  . Frequency  of Communication with Friends and Family: Twice a week  . Frequency of Social Gatherings with Friends and Family: More than three times a week  . Attends Religious Services: More than 4 times per year  . Active Member of Clubs or Organizations: No  . Attends Archivist Meetings: Never  . Marital Status: Married  Human resources officer Violence: Not At Risk  . Fear of Current or Ex-Partner: No  . Emotionally Abused: No  . Physically Abused: No  . Sexually Abused: No    FAMILY HISTORY: Family History  Problem Relation Age of Onset  . Hypertension Other   . Cancer Other   . Colon cancer Neg Hx     ALLERGIES:  has No Known Allergies.  MEDICATIONS:  Current Outpatient Medications  Medication Sig Dispense Refill  . aspirin-acetaminophen-caffeine (EXCEDRIN MIGRAINE) 250-250-65 MG tablet Take 1 tablet by mouth every 8 (eight) hours as needed for migraine.    Marland Kitchen atorvastatin (LIPITOR) 10 MG tablet Take 1 tablet (10 mg total) by mouth daily. 90 tablet 3  . Blood Glucose Monitoring Suppl (ONE TOUCH ULTRA 2) w/Device KIT Use to check blood sugars once daily. 1 kit 0  . canagliflozin (INVOKANA) 300 MG TABS tablet Take 1 tablet (300 mg total) by mouth daily before breakfast. 30 tablet 11  . CHELATED ZINC PO Take 1 capsule by mouth daily.    . Cholecalciferol (VITAMIN D3) 2000 units capsule Take 2,000 Units by mouth daily.     . Cyanocobalamin (VITAMIN B 12 PO) Take 2,500 mcg by mouth daily.     Marland Kitchen esomeprazole (NEXIUM) 40 MG capsule TAKE 1 CAPSULE BY MOUTH EVERY DAY 90 capsule 3  . FOLIC ACID PO Take 619 mcg by mouth daily.     Marland Kitchen glipiZIDE (GLUCOTROL) 10 MG tablet Take 1 tablet (10 mg total) by mouth 2 (two) times daily before a meal. 180 tablet 3  . glucose blood test strip USE ONCE A DAY TO TEST BLOOD SUGAR 100 each 3  . GNP GARLIC EXTRACT PO Take 509 mg by mouth daily.    . Levomefolate Glucosamine (METHYLFOLATE PO) Take 1 tablet by mouth daily.    Marland Kitchen losartan (COZAAR) 100 MG tablet Take  1 tablet (100 mg total) by mouth daily. 90 tablet 3  . Omega-3 Fatty Acids (FISH OIL) 1000 MG CAPS Take by mouth daily.     Glory Rosebush Delica Lancets 32I MISC USE ONCE A DAY WHEN CHECKING BLOOD SUGAR 100 each 3  . pyridOXINE (VITAMIN B-6) 100 MG tablet Take 100 mg by mouth daily.    Marland Kitchen SYNTHROID 137 MCG tablet Take 1 tablet (137 mcg total)  by mouth daily before breakfast. 90 tablet 3  . tamsulosin (FLOMAX) 0.4 MG CAPS capsule Take 2 capsules (0.8 mg total) by mouth daily. 180 capsule 3  . temazepam (RESTORIL) 30 MG capsule Take 1 capsule (30 mg total) by mouth at bedtime as needed for sleep. 30 capsule 5   No current facility-administered medications for this visit.    REVIEW OF SYSTEMS:   10 Point review of Systems was done is negative except as noted above.  PHYSICAL EXAMINATION: ECOG PERFORMANCE STATUS: 1 - Symptomatic but completely ambulatory  . Vitals:   03/04/21 0940  BP: 131/72  Pulse: 80  Resp: 20  Temp: (!) 97 F (36.1 C)  SpO2: 99%   Filed Weights   03/04/21 0940  Weight: 218 lb 12.8 oz (99.2 kg)   .Body mass index is 30.52 kg/m.   NAD GENERAL:alert, in no acute distress and comfortable SKIN: no acute rashes, no significant lesions EYES: conjunctiva are pink and non-injected, sclera anicteric OROPHARYNX: MMM, no exudates, no oropharyngeal erythema or ulceration NECK: supple, no JVD LYMPH:  no palpable lymphadenopathy in the cervical, axillary or inguinal regions LUNGS: clear to auscultation b/l with normal respiratory effort HEART: regular rate & rhythm ABDOMEN:  normoactive bowel sounds , non tender, not distended. Extremity: no pedal edema PSYCH: alert & oriented x 3 with fluent speech NEURO: no focal motor/sensory deficits   LABORATORY DATA:  I have reviewed the data as listed  . CBC Latest Ref Rng & Units 02/25/2021 10/28/2020 10/24/2019  WBC 4.0 - 10.5 K/uL 3.8(L) 6.4 7.1  Hemoglobin 13.0 - 17.0 g/dL 13.4 13.7 14.0  Hematocrit 39.0 - 52.0 % 39.6  40.4 41.1  Platelets 150 - 400 K/uL 189 230 214.0   . CBC    Component Value Date/Time   WBC 3.8 (L) 02/25/2021 0808   RBC 4.34 02/25/2021 0808   HGB 13.4 02/25/2021 0808   HCT 39.6 02/25/2021 0808   PLT 189 02/25/2021 0808   MCV 91.2 02/25/2021 0808   MCH 30.9 02/25/2021 0808   MCHC 33.8 02/25/2021 0808   RDW 13.2 02/25/2021 0808   LYMPHSABS 1.6 02/25/2021 0808   MONOABS 0.4 02/25/2021 0808   EOSABS 0.1 02/25/2021 0808   BASOSABS 0.0 02/25/2021 0808    . CMP Latest Ref Rng & Units 02/25/2021 11/01/2020 10/28/2020  Glucose 70 - 99 mg/dL 185(H) 291(H) 143(H)  BUN 8 - 23 mg/dL 30(H) 29(H) 31(H)  Creatinine 0.61 - 1.24 mg/dL 1.89(H) 1.78(H) 1.63(H)  Sodium 135 - 145 mmol/L 137 135 135  Potassium 3.5 - 5.1 mmol/L 4.4 4.5 4.5  Chloride 98 - 111 mmol/L 103 104 99  CO2 22 - 32 mmol/L $RemoveB'23 22 25  'SSCuHDun$ Calcium 8.9 - 10.3 mg/dL 8.8(L) 9.0 9.2  Total Protein 6.5 - 8.1 g/dL 8.6(H) 8.1 8.1  Total Bilirubin 0.3 - 1.2 mg/dL 0.6 0.5 0.7  Alkaline Phos 38 - 126 U/L 76 87 -  AST 15 - 41 U/L 13(L) 13(L) 16  ALT 0 - 44 U/L $Remo'19 19 25   'KunVO$ Component     Latest Ref Rng & Units 05/04/2018  Iron     42 - 163 ug/dL 75  TIBC     202 - 409 ug/dL 255  Saturation Ratios     42 - 163 % 29 (L)  UIBC     ug/dL 180  LDH     125 - 245 U/L 143  Sed Rate     0 - 16 mm/hr 20 (H)  Ferritin  22 - 316 ng/mL 167  Intrinsic Factor     0.0 - 1.1 AU/mL 1.0  Parietal Cell Antibody-IgG     0.0 - 20.0 Units 1.9   Component     Latest Ref Rng & Units 05/09/2018  Total Protein, Urine-UPE24     Not Estab. mg/dL 7.6  Total Protein, Urine-Ur/day     30 - 150 mg/24 hr 148  ALBUMIN, U     % 24.2  ALPHA 1 URINE     % 3.9  Alpha 2, Urine     % 12.7  % BETA, Urine     % 26.6  GAMMA GLOBULIN URINE     % 32.6  Free Kappa Lt Chains,Ur     1.35 - 24.19 mg/L 82.60 (H)  Free Lambda Lt Chains,Ur     0.24 - 6.66 mg/L 16.80 (H)  Free Kappa/Lambda Ratio     2.04 - 10.37 4.92  Immunofixation Result, Urine       Comment  Total Volume      1,950  M-SPIKE %, Urine     Not Observed % Not Observed  NOTE:      Comment      OUTSIDE LABS 04/21/18       RADIOGRAPHIC STUDIES: I have personally reviewed the radiological images as listed and agreed with the findings in the report. No results found.  ASSESSMENT & PLAN:   AHMAR PICKRELL is a 80 y.o. caucasian male with    1. IgG Lambda monoclonal paraproteinemia  He has a M-spike of 0.8g/dl and normal SFLC and ratio. No anemia - nl Hgb No focal bone pains --- Skeletal survey - shows no  No hypercalcemia. 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  05/10/18 Metastatic Bone Survey revealed no lytic lesions on skeletal survey to suggest multiple myeloma.   PLAN:  -Discussed pt labwork, cbc - no anemia. cmp - gradually increasing creatinine level. - SPEP shows Mspike increased to 1.9 - concerning for progression of plasma cell dyscrasia. -recommend BM Bx and PET/CT for further characterization of his plasma cell dyscrasia. -Recommend pt f/u with PCP for Diabetes and HTN management.  -Recommend pt begin 2000 IU Vitamin D daily.    2. 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 Hollie Howard  -mild non nephrotic proteinuria on 24h UPEP-Continue follow up with Dr Troy Howard for CKD    3. Vitamin B12 deficient  Antibody testing done - no evidence of pernicious anemia. No evidence of pernicious anemia based on neg IF and parietal cell ab -He has been actively losing weight by eating healthy with no diet restrictions.  -I discussed the possible cause of this deficiency such as long term use of Nexium and Metformin. He is no longer on Metformin.  -continue B12 replacement per PCP  4.  Past Medical History:  Diagnosis Date  . Arthritis    back  . Cancer [facility])    prostate, sees Dr. Raynelle Howard   . Diabetes mellitus   . Erectile dysfunction   .  GERD (gastroesophageal reflux disease)   . Hyperlipidemia   . Hypertension   . Migraines   . Peptic ulcer     FOLLOW UP: Labs today (UPEP- urine  container) CT bone marrow aspiration and biopsy in 7-10 days PET/CT in 7-10 days RTC with Dr Irene Limbo in 3 weeks   The total  time spent in the appt was 30 minutes and more than 50% was on counseling and direct patient cares.  All of the patient's questions were answered with apparent satisfaction. The patient knows to call the clinic with any problems, questions or concerns.    Sullivan Lone MD [place] AAHIVMS [facility] Hematology/Oncology Physician [facility]  (Office):       [phone] (Work cell):  [phone] (Fax):           [phone]  03/03/2021 9:37 PM  I, Reinaldo Raddle, am acting as scribe for Dr. Sullivan Lone, MD.    .I have reviewed the above documentation for accuracy and completeness, and I agree with the above. [name] MD

## 2021-03-04 ENCOUNTER — Inpatient Hospital Stay: Payer: PPO | Attending: Hematology | Admitting: Hematology

## 2021-03-04 ENCOUNTER — Other Ambulatory Visit: Payer: Self-pay

## 2021-03-04 VITALS — BP 131/72 | HR 80 | Temp 97.0°F | Resp 20 | Ht 71.0 in | Wt 218.8 lb

## 2021-03-04 DIAGNOSIS — D472 Monoclonal gammopathy: Secondary | ICD-10-CM | POA: Diagnosis not present

## 2021-03-04 DIAGNOSIS — Z79899 Other long term (current) drug therapy: Secondary | ICD-10-CM | POA: Insufficient documentation

## 2021-03-04 DIAGNOSIS — E119 Type 2 diabetes mellitus without complications: Secondary | ICD-10-CM | POA: Insufficient documentation

## 2021-03-04 DIAGNOSIS — C9000 Multiple myeloma not having achieved remission: Secondary | ICD-10-CM

## 2021-03-04 DIAGNOSIS — I10 Essential (primary) hypertension: Secondary | ICD-10-CM | POA: Insufficient documentation

## 2021-03-05 ENCOUNTER — Telehealth: Payer: Self-pay | Admitting: Pharmacist

## 2021-03-05 NOTE — Chronic Care Management (AMB) (Incomplete Revision)
  Chronic Care Management Pharmacy Assistant   Name: Troy Howard  MRN: [id number] DOB: 10/28/1941  Reason for Encounter: Disease State   Conditions to be addressed/monitored: DMII  Recent office visits:  03.24.2022 Phone Call Atorvastatin Calcium 10 mg Oral Daily -Started Canagliflozin 300 mg Oral Daily before breakfast-Stared D/c'd: Januvia 100 mg daily  Recent consult visits:  04.05.2022 [name], [name], MD Oncology Hospital visits:  None in previous 6 months  Medications: Outpatient Encounter Medications as of 03/05/2021  Medication Sig   aspirin-acetaminophen-caffeine (EXCEDRIN MIGRAINE) 250-250-65 MG tablet Take 1 tablet by mouth every 8 (eight) hours as needed for migraine.   atorvastatin (LIPITOR) 10 MG tablet Take 1 tablet (10 mg total) by mouth daily.   Blood Glucose Monitoring Suppl (ONE TOUCH ULTRA 2) w/Device KIT Use to check blood sugars once daily.   canagliflozin (INVOKANA) 300 MG TABS tablet Take 1 tablet (300 mg total) by mouth daily before breakfast.   CHELATED ZINC PO Take 1 capsule by mouth daily.   Cholecalciferol (VITAMIN D3) 2000 units capsule Take 2,000 Units by mouth daily.    Cyanocobalamin (VITAMIN B 12 PO) Take 2,500 mcg by mouth daily.    esomeprazole (NEXIUM) 40 MG capsule TAKE 1 CAPSULE BY MOUTH EVERY DAY   FOLIC ACID PO Take 800 mcg by mouth daily.    glipiZIDE (GLUCOTROL) 10 MG tablet Take 1 tablet (10 mg total) by mouth 2 (two) times daily before a meal.   glucose blood test strip USE ONCE A DAY TO TEST BLOOD SUGAR   GNP GARLIC EXTRACT PO Take 800 mg by mouth daily.   Levomefolate Glucosamine (METHYLFOLATE PO) Take 1 tablet by mouth daily.   losartan (COZAAR) 100 MG tablet Take 1 tablet (100 mg total) by mouth daily.   Omega-3 Fatty Acids (FISH OIL) 1000 MG CAPS Take by mouth daily.    OneTouch Delica Lancets 30G MISC USE ONCE A DAY WHEN CHECKING BLOOD SUGAR   pyridOXINE (VITAMIN B-6) 100 MG tablet Take 100 mg by mouth daily.    SYNTHROID 137 MCG tablet Take 1 tablet (137 mcg total) by mouth daily before breakfast.   tamsulosin (FLOMAX) 0.4 MG CAPS capsule Take 2 capsules (0.8 mg total) by mouth daily.   temazepam (RESTORIL) 30 MG capsule Take 1 capsule (30 mg total) by mouth at bedtime as needed for sleep.   No facility-administered encounter medications on file as of 03/05/2021.    Recent Relevant Labs: Lab Results  Component Value Date/Time   HGBA1C 7.2 (A) 02/11/2021 08:56 AM   HGBA1C 8.7 (H) 10/28/2020 01:37 PM   HGBA1C 7.0 (H) 10/24/2019 02:19 PM   MICROALBUR 7.3 (H) 08/14/2014 10:11 AM   MICROALBUR 1.1 03/22/2012 09:32 AM    Kidney Function Lab Results  Component Value Date/Time   CREATININE 1.89 (H) 02/25/2021 08:08 AM   CREATININE 1.78 (H) 11/01/2020 09:01 AM   CREATININE 1.63 (H) 10/28/2020 01:37 PM   GFR 47.18 (L) 10/24/2019 02:19 PM   GFRNONAA 36 (L) 02/25/2021 08:08 AM   GFRNONAA 40 (L) 10/28/2020 01:37 PM   GFRAA 46 (L) 10/28/2020 01:37 PM    Current antihyperglycemic regimen:   Invokana 300 mg 1 tablet daily before breakfast  glipizide 10 mg, 1 tablet twice daily before meals What recent interventions/DTPs have been made to improve glycemic control:  Januvia 100 mg 1 tablet daily was discontinued Invokann Have there been any recent hospitalizations or ED visits since last visit with CPP? No Patient denies hypoglycemic symptoms, including   Pale, Sweaty, Shaky, Hungry, Nervous/irritable and Vision changes Patient reports hyperglycemic symptoms, including none How often are you checking your blood sugar? once daily What are your blood sugars ranging?  Fasting: None Before meals:  04.03 96 04.05 89 After meals: none Bedtime: None During the week, how often does your blood glucose drop below 70? Never Are you checking your feet daily/regularly? Yes  Adherence Review: Is the patient currently on a STATIN medication? Yes Is the patient currently on ACE/ARB medication? Yes Does the  patient have >5 day gap between last estimated fill dates? No  I spoke with the patient and discussed medication adherence. There are no issues with his current medication. I questioned the patient on the effects of the changes in his medications from his last appointment.  He states that he is not having any side effects from his medication. He states he does not have many of his blood sugar reading numbers written down. He was encouraged to buy a small notebook so he can keep track of his blood sugar readings. He understood. He denies any emergency department visits since his last CPP or PCP visit. There are no current issues with his pharmacy.  Star Rating Drugs:  Dispensed Quantity Pharmacy  Glipizide  02.25.2022 [facility]  Atorvastatin [facility]24.2022 [facility]  Invokana [facility].24.[facility]22 [facility]   Amilia (Mimi) Frazier, CMA Health Concierge [phone]  

## 2021-03-05 NOTE — Chronic Care Management (AMB) (Addendum)
Chronic Care Management Pharmacy Assistant   Name: Troy Howard  MRN: [id number] DOB: 1941-10-02  Reason for Encounter: Disease State   Conditions to be addressed/monitored: DMII  Recent office visits:  03.24.2022 Phone Call Atorvastatin Calcium 10 mg Oral Daily -Started Canagliflozin 300 mg Oral Daily before breakfast-Stared D/c'd: Januvia 100 mg daily  Recent consult visits:  04.05.2022 [name], [place] Hospital visits:  None in previous 6 months  Medications: Outpatient Encounter Medications as of 03/05/2021  Medication Sig   aspirin-acetaminophen-caffeine (EXCEDRIN MIGRAINE) 250-250-65 MG tablet Take 1 tablet by mouth every 8 (eight) hours as needed for migraine.   atorvastatin (LIPITOR) 10 MG tablet Take 1 tablet (10 mg total) by mouth daily.   Blood Glucose Monitoring Suppl (ONE TOUCH ULTRA 2) w/Device KIT Use to check blood sugars once daily.   canagliflozin (INVOKANA) 300 MG TABS tablet Take 1 tablet (300 mg total) by mouth daily before breakfast.   CHELATED ZINC PO Take 1 capsule by mouth daily.   Cholecalciferol (VITAMIN D3) 2000 units capsule Take 2,000 Units by mouth daily.    Cyanocobalamin (VITAMIN B 12 PO) Take 2,500 mcg by mouth daily.    esomeprazole (NEXIUM) 40 MG capsule TAKE 1 CAPSULE BY MOUTH EVERY DAY   FOLIC ACID PO Take 025 mcg by mouth daily.    glipiZIDE (GLUCOTROL) 10 MG tablet Take 1 tablet (10 mg total) by mouth 2 (two) times daily before a meal.   glucose blood test strip USE ONCE A DAY TO TEST BLOOD SUGAR   GNP GARLIC EXTRACT PO Take 852 mg by mouth daily.   Levomefolate Glucosamine (METHYLFOLATE PO) Take 1 tablet by mouth daily.   losartan (COZAAR) 100 MG tablet Take 1 tablet (100 mg total) by mouth daily.   Omega-3 Fatty Acids (FISH OIL) 1000 MG CAPS Take by mouth daily.    OneTouch Delica Lancets 77O MISC USE ONCE A DAY WHEN CHECKING BLOOD SUGAR   pyridOXINE (VITAMIN B-6) 100 MG tablet Take 100 mg by mouth daily.    SYNTHROID 137 MCG tablet Take 1 tablet (137 mcg total) by mouth daily before breakfast.   tamsulosin (FLOMAX) 0.4 MG CAPS capsule Take 2 capsules (0.8 mg total) by mouth daily.   temazepam (RESTORIL) 30 MG capsule Take 1 capsule (30 mg total) by mouth at bedtime as needed for sleep.   No facility-administered encounter medications on file as of 03/05/2021.    Recent Relevant Labs: Lab Results  Component Value Date/Time   HGBA1C 7.2 (A) 02/11/2021 08:56 AM   HGBA1C 8.7 (H) 10/28/2020 01:37 PM   HGBA1C 7.0 (H) 10/24/2019 02:19 PM   MICROALBUR 7.3 (H) 08/14/2014 10:11 AM   MICROALBUR 1.1 03/22/2012 09:32 AM    Kidney Function Lab Results  Component Value Date/Time   CREATININE 1.89 (H) 02/25/2021 08:08 AM   CREATININE 1.78 (H) 11/01/2020 09:01 AM   CREATININE 1.63 (H) 10/28/2020 01:37 PM   GFR 47.18 (L) 10/24/2019 02:19 PM   GFRNONAA 36 (L) 02/25/2021 08:08 AM   GFRNONAA 40 (L) 10/28/2020 01:37 PM   GFRAA 46 (L) 10/28/2020 01:37 PM    Current antihyperglycemic regimen:   Invokana 300 mg 1 tablet daily before breakfast  glipizide 10 mg, 1 tablet twice daily before meals What recent interventions/DTPs have been made to improve glycemic control:  Januvia 100 mg 1 tablet daily was discontinued Invokanna 300 mg  1 tablet daily was started Have there been any recent hospitalizations or ED visits since last visit  with CPP? No Patient denies hypoglycemic symptoms, including Pale, Sweaty, Shaky, Hungry, Nervous/irritable and Vision changes Patient reports hyperglycemic symptoms, including none How often are you checking your blood sugar? once daily What are your blood sugars ranging?  Fasting: None Before meals:  04.03 96 04.05 89 After meals: none Bedtime: None During the week, how often does your blood glucose drop below 70? Never Are you checking your feet daily/regularly? Yes  Adherence Review: Is the patient currently on a STATIN medication? Yes Is the patient currently on  ACE/ARB medication? Yes Does the patient have >5 day gap between last estimated fill dates? No  I spoke with the patient and discussed medication adherence. There are no issues with his current medication. I questioned the patient on the effects of the changes in his medications from his last appointment.  He states that he is not having any side effects from his medication. He states he does not have many of his blood sugar reading numbers written down. He was encouraged to buy a small notebook so he can keep track of his blood sugar readings. He understood. He denies any emergency department visits since his last CPP or PCP visit. There are no current issues with his pharmacy.  Star Rating Drugs:  Dispensed Quantity Pharmacy  Gl[facility]ide  [facility]22 [facility]  Atorvastatin [facility]  Invokana [facility]   Maia Breslow, [place] [phone]

## 2021-03-06 ENCOUNTER — Encounter: Payer: Self-pay | Admitting: Gastroenterology

## 2021-03-11 ENCOUNTER — Telehealth: Payer: Self-pay | Admitting: Hematology

## 2021-03-11 NOTE — Telephone Encounter (Signed)
Scheduled appt per 4/12 sch msg. Pt aware.  

## 2021-03-12 ENCOUNTER — Other Ambulatory Visit: Payer: Self-pay | Admitting: *Deleted

## 2021-03-12 DIAGNOSIS — D472 Monoclonal gammopathy: Secondary | ICD-10-CM | POA: Diagnosis not present

## 2021-03-14 LAB — UPEP/UIFE/LIGHT CHAINS/TP, 24-HR UR
% BETA, Urine: 43.6 %
ALPHA 1 URINE: 3.1 %
Albumin, U: 31.2 %
Alpha 2, Urine: 12.4 %
Free Kappa Lt Chains,Ur: 47.2 mg/L (ref 1.17–86.46)
Free Kappa/Lambda Ratio: 3.15 (ref 1.83–14.26)
Free Lambda Lt Chains,Ur: 14.97 mg/L (ref 0.27–15.21)
GAMMA GLOBULIN URINE: 9.7 %
Total Protein, Urine-Ur/day: 187 mg/24 hr — ABNORMAL HIGH (ref 30–150)
Total Protein, Urine: 8.5 mg/dL
Total Volume: 2200

## 2021-03-25 ENCOUNTER — Ambulatory Visit (HOSPITAL_COMMUNITY)
Admission: RE | Admit: 2021-03-25 | Discharge: 2021-03-25 | Disposition: A | Discharge status: Home / Self Care | Payer: PPO | Source: Ambulatory Visit | Attending: Hematology | Admitting: Hematology

## 2021-03-25 ENCOUNTER — Other Ambulatory Visit: Payer: Self-pay

## 2021-03-25 ENCOUNTER — Other Ambulatory Visit: Payer: Self-pay | Admitting: Student

## 2021-03-25 DIAGNOSIS — C9000 Multiple myeloma not having achieved remission: Secondary | ICD-10-CM | POA: Insufficient documentation

## 2021-03-25 LAB — GLUCOSE, CAPILLARY: Glucose-Capillary: 133 mg/dL — ABNORMAL HIGH (ref 70–99)

## 2021-03-25 MED ORDER — FLUDEOXYGLUCOSE F - 18 (FDG) INJECTION
10.5200 | Freq: Once | INTRAVENOUS | Status: AC | PRN
  Administered 2021-03-25: 10.52 via INTRAVENOUS

## 2021-03-26 ENCOUNTER — Other Ambulatory Visit: Payer: Self-pay | Admitting: Student

## 2021-03-27 ENCOUNTER — Ambulatory Visit: Payer: PPO | Admitting: Hematology

## 2021-03-27 ENCOUNTER — Other Ambulatory Visit: Payer: Self-pay

## 2021-03-27 ENCOUNTER — Ambulatory Visit (HOSPITAL_COMMUNITY)
Admission: RE | Admit: 2021-03-27 | Discharge: 2021-03-27 | Disposition: A | Discharge status: Home / Self Care | Payer: PPO | Source: Ambulatory Visit | Attending: Hematology | Admitting: Hematology

## 2021-03-27 ENCOUNTER — Encounter (HOSPITAL_COMMUNITY): Payer: Self-pay

## 2021-03-27 DIAGNOSIS — C9000 Multiple myeloma not having achieved remission: Secondary | ICD-10-CM

## 2021-03-27 DIAGNOSIS — D472 Monoclonal gammopathy: Secondary | ICD-10-CM | POA: Diagnosis not present

## 2021-03-27 DIAGNOSIS — Z8546 Personal history of malignant neoplasm of prostate: Secondary | ICD-10-CM | POA: Diagnosis not present

## 2021-03-27 LAB — CBC
HCT: 40 % (ref 39.0–52.0)
Hemoglobin: 13.3 g/dL (ref 13.0–17.0)
MCH: 30.8 pg (ref 26.0–34.0)
MCHC: 33.3 g/dL (ref 30.0–36.0)
MCV: 92.6 fL (ref 80.0–100.0)
Platelets: 209 10*3/uL (ref 150–400)
RBC: 4.32 MIL/uL (ref 4.22–5.81)
RDW: 14.4 % (ref 11.5–15.5)
WBC: 4.7 10*3/uL (ref 4.0–10.5)
nRBC: 0 % (ref 0.0–0.2)

## 2021-03-27 LAB — PROTIME-INR
INR: 1 (ref 0.8–1.2)
Prothrombin Time: 12.8 seconds (ref 11.4–15.2)

## 2021-03-27 LAB — GLUCOSE, CAPILLARY: Glucose-Capillary: 132 mg/dL — ABNORMAL HIGH (ref 70–99)

## 2021-03-27 MED ORDER — LIDOCAINE HCL (PF) 1 % IJ SOLN
INTRAMUSCULAR | Status: AC | PRN
  Administered 2021-03-27: 10 mL

## 2021-03-27 MED ORDER — SODIUM CHLORIDE 0.9 % IV SOLN: INTRAVENOUS | Status: DC

## 2021-03-27 MED ORDER — FENTANYL CITRATE (PF) 100 MCG/2ML IJ SOLN
INTRAMUSCULAR | Status: AC
  Filled 2021-03-27: qty 2

## 2021-03-27 MED ORDER — FENTANYL CITRATE (PF) 100 MCG/2ML IJ SOLN
INTRAMUSCULAR | Status: AC | PRN
  Administered 2021-03-27 (×2): 50 ug via INTRAVENOUS

## 2021-03-27 MED ORDER — MIDAZOLAM HCL 2 MG/2ML IJ SOLN
INTRAMUSCULAR | Status: AC
  Filled 2021-03-27: qty 4

## 2021-03-27 MED ORDER — MIDAZOLAM HCL 2 MG/2ML IJ SOLN
INTRAMUSCULAR | Status: AC | PRN
  Administered 2021-03-27: 2 mg via INTRAVENOUS
  Administered 2021-03-27 (×2): 1 mg via INTRAVENOUS

## 2021-03-27 NOTE — Procedures (Signed)
Interventional Radiology Procedure:   Indications: IgG Lambda monoclonal paraproteinemia  Procedure: CT guided bone marrow biopsy  Findings: 2 aspirates and 1 core from right ilium  Complications: None     EBL: Minimal, less than 10 ml  Plan: Discharge to home in one hour.   Troy Howard R. Anselm Pancoast, MD  Pager: [phone]

## 2021-03-27 NOTE — Discharge Instructions (Signed)
Please call Interventional Radiology clinic [phone] with any questions or concerns.  You may remove your dressing and shower tomorrow.   Bone Marrow Aspiration and Bone Marrow Biopsy, Adult, Care After This sheet gives you information about how to care for yourself after your procedure. Your health care provider may also give you more specific instructions. If you have problems or questions, contact your health care provider. What can I expect after the procedure? After the procedure, it is common to have:  Mild pain and tenderness.  Swelling.  Bruising. Follow these instructions at home: Puncture site care 1. Follow instructions from your health care provider about how to take care of the puncture site. Make sure you: ? Wash your hands with soap and water before and after you change your bandage (dressing). If soap and water are not available, use hand sanitizer. ? Change your dressing as told by your health care provider. 2. Check your puncture site every day for signs of infection. Check for: ? More redness, swelling, or pain. ? Fluid or blood. ? Warmth. ? Pus or a bad smell.   Activity 1. Return to your normal activities as told by your health care provider. Ask your health care provider what activities are safe for you. 2. Do not lift anything that is heavier than 10 lb (4.5 kg), or the limit that you are told, until your health care provider says that it is safe. 3. Do not drive for 24 hours if you were given a sedative during your procedure. General instructions 1. Take over-the-counter and prescription medicines only as told by your health care provider. 2. Do not take baths, swim, or use a hot tub until your health care provider approves. Ask your health care provider if you may take showers. You may only be allowed to take sponge baths. 3. If directed, put ice on the affected area. To do this: ? Put ice in a plastic bag. ? Place a towel between your skin and the  bag. ? Leave the ice on for 20 minutes, 2-3 times a day. 4. Keep all follow-up visits as told by your health care provider. This is important.   Contact a health care provider if:  Your pain is not controlled with medicine.  You have a fever.  You have more redness, swelling, or pain around the puncture site.  You have fluid or blood coming from the puncture site.  Your puncture site feels warm to the touch.  You have pus or a bad smell coming from the puncture site. Summary  After the procedure, it is common to have mild pain, tenderness, swelling, and bruising.  Follow instructions from your health care provider about how to take care of the puncture site and what activities are safe for you.  Take over-the-counter and prescription medicines only as told by your health care provider.  Contact a health care provider if you have any signs of infection, such as fluid or blood coming from the puncture site. This information is not intended to replace advice given to you by your health care provider. Make sure you discuss any questions you have with your health care provider. Document Revised: 04/04/2019 Document Reviewed: 04/04/2019 Elsevier Patient Education  2021 [place].   Moderate Conscious Sedation, Adult, Care After This sheet gives you information about how to care for yourself after your procedure. Your health care provider may also give you more specific instructions. If you have problems or questions, contact your health care provider. What  can I expect after the procedure? After the procedure, it is common to have:  Sleepiness for several hours.  Impaired judgment for several hours.  Difficulty with balance.  Vomiting if you eat too soon. Follow these instructions at home: For the time period you were told by your health care provider: 3. Rest. 4. Do not participate in activities where you could fall or become injured. 5. Do not drive or use  machinery. 6. Do not drink alcohol. 7. Do not take sleeping pills or medicines that cause drowsiness. 8. Do not make important decisions or sign legal documents. 9. Do not take care of children on your own.      Eating and drinking 4. Follow the diet recommended by your health care provider. 5. Drink enough fluid to keep your urine pale yellow. 6. If you vomit: ? Drink water, juice, or soup when you can drink without vomiting. ? Make sure you have little or no nausea before eating solid foods.   General instructions 5. Take over-the-counter and prescription medicines only as told by your health care provider. 6. Have a responsible adult stay with you for the time you are told. It is important to have someone help care for you until you are awake and alert. 7. Do not smoke. 8. Keep all follow-up visits as told by your health care provider. This is important. Contact a health care provider if:  You are still sleepy or having trouble with balance after 24 hours.  You feel light-headed.  You keep feeling nauseous or you keep vomiting.  You develop a rash.  You have a fever.  You have redness or swelling around the IV site. Get help right away if:  You have trouble breathing.  You have new-onset confusion at home. Summary  After the procedure, it is common to feel sleepy, have impaired judgment, or feel nauseous if you eat too soon.  Rest after you get home. Know the things you should not do after the procedure.  Follow the diet recommended by your health care provider and drink enough fluid to keep your urine pale yellow.  Get help right away if you have trouble breathing or new-onset confusion at home. This information is not intended to replace advice given to you by your health care provider. Make sure you discuss any questions you have with your health care provider. Document Revised: 03/15/2020 Document Reviewed: 10/12/2019 Elsevier Patient Education  2021 [employer]      

## 2021-03-27 NOTE — H&P (Signed)
Chief Complaint: Patient was seen in consultation today for image guided bone marrow biopsy and aspiration at the request of Troy Howard  Referring Physician(s): Troy Howard  Supervising Physician: Troy Howard  Patient Status: [facility] - Out-pt  History of Present Illness: Troy Howard is a 80 y.o. male new to our service.  His past medical history includes hypertension, migraines, hyperlipidemia, peptic ulcer, GERD, ED, DM, arthritis, and prostate cancer, being followed by Dr. Alinda Howard.  Patient was referred to hematology and oncology by his nephrologist Dr. Hollie Howard due to positive M spike and was seen by Dr. Irene Howard.  Further lab work showed findings that were not suggestive of myeloma, and patient's bone survey was negative for lytic lesions. Bone marrow biopsy was not indictaed at the time of initial oncology consultation due to above reasons.  Patient has been followed by Dr. 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 A bone marrow biopsy and aspiration was recommended to the patient for further workup.  After thorough discussion and shared discission making, patient decided to undergo a bone marrow biopsy and aspiration.   IR was requested for image guided bone marrow biopsy and aspiration.   Patient laying in bed, not in acute distress.  Denise headache, fever, chills, shortness of breath, cough, chest pain, abdominal pain, nausea ,vomiting, and bleeding.  Past Medical History:  Diagnosis Date  . Arthritis    back  . Cancer [facility])    prostate, sees Dr. Raynelle Howard   . Diabetes mellitus   . Erectile dysfunction   . GERD (gastroesophageal reflux disease)   . Hyperlipidemia   . Hypertension   . Migraines   . Peptic ulcer     Past Surgical History:  Procedure Laterality Date  . APPENDECTOMY  1950  . COLONOSCOPY  11/14/2015   per Dr. Fuller Plan, adenomatous polyps, repeat in 5 yrs   . HERNIA REPAIR      umbilical   . MENISCUS REPAIR Left   . PROSTATE SURGERY  2009   radical prostatectomy  . SPINE SURGERY     L4-L5 fusion per Dr. Glenna Fellows   . TONSILLECTOMY AND ADENOIDECTOMY  1954    Allergies: Patient has no known allergies.  Medications: Prior to Admission medications   Medication Sig Start Date End Date Taking? Authorizing Provider  aspirin-acetaminophen-caffeine (EXCEDRIN MIGRAINE) [phone] MG tablet Take 1 tablet by mouth every 8 (eight) hours as needed for migraine.   Yes [provider]  atorvastatin (LIPITOR) 10 MG tablet Take 1 tablet (10 mg total) by mouth daily. 02/20/21  Yes Laurey Morale, MD  Blood Glucose Monitoring Suppl (ONE TOUCH ULTRA 2) w/Device KIT Use to check blood sugars once daily. 11/12/20  Yes Laurey Morale, MD  canagliflozin [facility]) 300 MG TABS tablet Take 1 tablet (300 mg total) by mouth daily before breakfast. 02/20/21  Yes Laurey Morale, MD  CHELATED ZINC PO Take 1 capsule by mouth daily.   Yes [provider]  Cholecalciferol (VITAMIN D3) 2000 units capsule Take 2,000 Units by mouth daily.    Yes [provider]  Cyanocobalamin (VITAMIN B 12 PO) Take 2,500 mcg by mouth daily.    Yes [provider]  esomeprazole (NEXIUM) 40 MG capsule TAKE 1 CAPSULE BY MOUTH EVERY DAY 10/28/20  Yes Laurey Morale, MD  FOLIC ACID PO Take 833 mcg by mouth daily.    Yes [provider]  glipiZIDE (GLUCOTROL) 10 MG  tablet Take 1 tablet (10 mg total) by mouth 2 (two) times daily before a meal. 10/28/20  Yes Laurey Morale, MD  glucose blood test strip USE ONCE A DAY TO TEST BLOOD SUGAR 11/12/20  Yes Laurey Morale, MD  GNP GARLIC EXTRACT PO Take 696 mg by mouth daily.   Yes [provider]  Levomefolate Glucosamine (METHYLFOLATE PO) Take 1 tablet by mouth daily.   Yes [provider]  losartan (COZAAR) 100 MG tablet Take 1 tablet (100 mg total) by mouth daily. 10/28/20  Yes Laurey Morale, MD  Omega-3 Fatty  Acids (FISH OIL) 1000 MG CAPS Take by mouth daily.    Yes [provider]  OneTouch Delica Lancets 78L MISC USE ONCE A DAY WHEN CHECKING BLOOD SUGAR 11/12/20  Yes Laurey Morale, MD  pyridOXINE (VITAMIN B-6) 100 MG tablet Take 100 mg by mouth daily.   Yes [provider]  SYNTHROID 137 MCG tablet Take 1 tablet (137 mcg total) by mouth daily before breakfast. 10/28/20  Yes Laurey Morale, MD  temazepam (RESTORIL) 30 MG capsule Take 1 capsule (30 mg total) by mouth at bedtime as needed for sleep. 10/28/20  Yes Laurey Morale, MD  tamsulosin (FLOMAX) 0.4 MG CAPS capsule Take 2 capsules (0.8 mg total) by mouth daily. 10/24/19   Laurey Morale, MD     Family History  Problem Relation Age of Onset  . Hypertension Other   . Cancer Other   . Colon cancer Neg Hx     Social History   Socioeconomic History  . Marital status: Married    Spouse name: Not on file  . Number of children: Not on file  . Years of education: Not on file  . Highest education level: Not on file  Occupational History  . Not on file  Tobacco Use  . Smoking status: Never Smoker  . Smokeless tobacco: Never Used  Vaping Use  . Vaping Use: Never used  Substance and Sexual Activity  . Alcohol use: Not Currently    Comment: rare  . Drug use: No  . Sexual activity: Not on file  Other Topics Concern  . Not on file  Social History Narrative  . Not on file   Social Determinants of Health   Financial Resource Strain: Low Risk   . Difficulty of Paying Living Expenses: Not hard at all  Food Insecurity: No Food Insecurity  . Worried About Charity fundraiser in the Last Year: Never true  . Ran Out of Food in the Last Year: Never true  Transportation Needs: No Transportation Needs  . Lack of Transportation (Medical): No  . Lack of Transportation (Non-Medical): No  Physical Activity: Inactive  . Days of Exercise per Week: 0 days  . Minutes of Exercise per Session: 0 min  Stress: No Stress Concern  Present  . Feeling of Stress : Not at all  Social Connections: Moderately Integrated  . Frequency of Communication with Friends and Family: Twice a week  . Frequency of Social Gatherings with Friends and Family: More than three times a week  . Attends Religious Services: More than 4 times per year  . Active Member of Clubs or Organizations: No  . Attends Archivist Meetings: Never  . Marital Status: Married     Review of Systems: A 12 point ROS discussed and pertinent positives are indicated in the HPI above.  All other systems are negative.   Vital Signs: BP Marland Kitchen)  142/82   Pulse 61   Temp (!) 97.4 F (36.3 C) (Oral)   Resp 18   Ht $R'5\' 11"'jM$  (1.803 m)   Wt 210 lb (95.3 kg)   SpO2 100%   BMI 29.29 kg/m   Physical Exam  Vitals and nursing note reviewed.  Constitutional:      General: He is not in acute distress.    Appearance: Normal appearance.  HENT:     Head: Normocephalic and atraumatic.     Mouth/Throat:     Mouth: Mucous membranes are moist.     Pharynx: Oropharynx is clear.  Cardiovascular:     Rate and Rhythm: Normal rate and regular rhythm.     Pulses: Normal pulses.     Heart sounds: Normal heart sounds.  Pulmonary:     Effort: Pulmonary effort is normal.     Breath sounds: Normal breath sounds. No wheezing, rhonchi or rales.  Abdominal:     General: Bowel sounds are normal. There is no distension.     Palpations: Abdomen is soft.  Skin:    General: Skin is warm and dry.  Neurological:     Mental Status: He is alert and oriented to person, place, and time.  Psychiatric:        Mood and Affect: Mood normal.        Behavior: Behavior normal.    MD Evaluation Airway: WNL Heart: WNL Abdomen: WNL Chest/ Lungs: WNL ASA  Classification: 2 Mallampati/Airway Score: Two  Imaging: NM PET Image Initial (PI) Whole Body  Result Date: 03/25/2021 CLINICAL DATA:  Initial treatment strategy for multiple myeloma. EXAM: NUCLEAR MEDICINE PET WHOLE BODY  TECHNIQUE: 10.5 mCi F-18 FDG was injected intravenously. Full-ring PET imaging was performed from the head to foot after the radiotracer. CT data was obtained and used for attenuation correction and anatomic localization. Fasting blood glucose: 133 mg/dl COMPARISON:  None. FINDINGS: Mediastinal blood pool activity: SUV max 2.6 HEAD/NECK: No hypermetabolic activity in the scalp. No hypermetabolic cervical lymph nodes. Incidental CT findings: none CHEST: No hypermetabolic mediastinal or hilar nodes. No suspicious pulmonary nodules on the CT scan. Incidental CT findings: none ABDOMEN/PELVIS: No abnormal hypermetabolic activity within the liver, pancreas, adrenal glands, or spleen. No hypermetabolic lymph nodes in the abdomen or pelvis. Incidental CT findings: none SKELETON: No focal hypermetabolic activity to suggest skeletal metastasis. Incidental CT findings: Posterior lumbar fusion. EXTREMITIES: No abnormal hypermetabolic activity in the lower extremities. Incidental CT findings: none IMPRESSION: 1. No evidence of active multiple myeloma in  the skeleton. 2. No lytic lesions CT exam Electronically Signed   By: Suzy Bouchard M.D.   On: 03/25/2021 12:47    Labs:  CBC: Recent Labs    10/28/20 1337 02/25/21 0808 03/27/21 0718  WBC 6.4 3.8* 4.7  HGB 13.7 13.4 13.3  HCT 40.4 39.6 40.0  PLT 230 189 209    COAGS: No results for input(s): INR, APTT in the last 8760 hours.  BMP: Recent Labs    10/28/20 1337 11/01/20 0901 02/25/21 0808  NA 135 135 137  K 4.5 4.5 4.4  CL 99 104 103  CO2 $Re'25 22 23  'mET$ GLUCOSE 143* 291* 185*  BUN 31* 29* 30*  CALCIUM 9.2 9.0 8.8*  CREATININE 1.63* 1.78* 1.89*  GFRNONAA 40* 39* 36*  GFRAA 46*  --   --     LIVER FUNCTION TESTS: Recent Labs    10/28/20 1337 11/01/20 0901 02/25/21 0808  BILITOT 0.7 0.5 0.6  AST 16 13* 13*  ALT '25 19 19  '$ ALKPHOS  --  87 76  PROT 8.1 8.1 8.6*  ALBUMIN  --  3.5 3.4*    TUMOR MARKERS: No results for input(s): AFPTM,  CEA, CA199, CHROMGRNA in the last 8760 hours.  Assessment and Plan: 80 y.o. 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   IR was requested for image guided bone marrow biopsy and aspiration for further workup of elevated M spike.  Patient presents to IR today for the procedure.  Npo since midnight Afebrile  Risks and benefits of bone marrow biopsy was discussed with the patient and/or patient's family including, but not limited to bleeding, infection, damage to adjacent structures or low yield requiring additional tests.  All of the questions were answered and there is agreement to proceed.  Consent signed and in chart.   Thank you for this interesting consult.  I greatly enjoyed meeting CORT DRAGOO and look forward to participating in their care.  A copy of this report was sent to the requesting provider on this date.  Electronically Signed: Tera Mater, PA-C 03/27/2021, 8:30 AM   I spent a total of  20 minutes  in face to face in clinical consultation, greater than 50% of which was counseling/coordinating care for bone marrow biopsy and aspiration

## 2021-04-04 ENCOUNTER — Encounter (HOSPITAL_COMMUNITY): Payer: Self-pay | Admitting: Hematology

## 2021-04-07 LAB — SURGICAL PATHOLOGY

## 2021-04-08 NOTE — Progress Notes (Signed)
HEMATOLOGY/ONCOLOGY CLINIC NOTE  Date of Service: 04/09/2021  Patient Care Team: Nelwyn Salisbury, MD as PCP - General (Family Medicine) Troy Howard, [facility] as Pharmacist (Pharmacist)  CHIEF COMPLAINTS/PURPOSE OF CONSULTATION:  Follow-up for smoldering myeloma  HISTORY OF PRESENTING ILLNESS:   Troy Howard 80 y.o. male is a here because of positive M-spike. The patient was referred by nephrologist Dr. Signe Colt. The patient presents to the clinic today accompanied by his wife.  He is being seen by nephrologist who pursued further work up to why his Creatinine has increased. Upon workup his M-protein was found to be 0.8g/dl.  He has had HTN and DM for many years that can affects his Kidney function and was thought to be the likely cause of his CKD.   Today his wife notes he has had muscle pain, muscle mass loss and fatigue.   He has HTN, DM, GERD, Hypothyroid, Hyperlipidemia. He was recently taken off statins, meloxicam, ASA and metformin. He takes Nexium for her GERD and has been on for many years.   On review of symptoms, pt notes purposeful weight loss, muscle loss and muscle pain. He also has leg pain from b/l knees to anterior shin.  Interval History:   [name] returns today regarding his M-spike. We are joined today by his wife, Mrs. Troy Howard. The patient's last visit with [place] was on 03/04/2021. The pt reports that he is doing well overall.  The pt reports that he had no issues with the bone marrow biopsy. The pt notes he felt as though he was in the twilight zone and did not experience any bone pains or issues related to it. The pt notes he does not feel any different than he did at the last visit. He noted general fatigue due to age that is not bothersome nor hindersome to the pt.  The pt has had PET Whole Body ([id number]) on 03/25/2021, which revealed "1. No evidence of active multiple myeloma in  the skeleton. 2. No lytic lesions CT exam"  The pt has had CT  Bone Marrow Biopsy & Aspiration on 03/27/2021.  On review of systems, pt denies overt fatigue, sudden change in energy levels, new bone pains, fevers, chills, and any other symptoms.  MEDICAL HISTORY:  Past Medical History:  Diagnosis Date  . Arthritis    back  . Cancer [facility])    prostate, sees Dr. Heloise Purpura   . Diabetes mellitus   . Erectile dysfunction   . GERD (gastroesophageal reflux disease)   . Hyperlipidemia   . Hypertension   . Migraines   . Peptic ulcer     SURGICAL HISTORY: Past Surgical History:  Procedure Laterality Date  . APPENDECTOMY  1950  . COLONOSCOPY  11/14/2015   per Dr. Russella Dar, adenomatous polyps, repeat in 5 yrs   . HERNIA REPAIR     umbilical   . MENISCUS REPAIR Left   . PROSTATE SURGERY  2009   radical prostatectomy  . SPINE SURGERY     L4-L5 fusion per Dr. Trey Sailors   . TONSILLECTOMY AND ADENOIDECTOMY  1954    SOCIAL HISTORY: Social History   Socioeconomic History  . Marital status: Married    Spouse name: Not on file  . Number of children: Not on file  . Years of education: Not on file  . Highest education level: Not on file  Occupational History  . Not on file  Tobacco Use  . Smoking status: Never Smoker  .  Smokeless tobacco: Never Used  Vaping Use  . Vaping Use: Never used  Substance and Sexual Activity  . Alcohol use: Not Currently    Comment: rare  . Drug use: No  . Sexual activity: Not on file  Other Topics Concern  . Not on file  Social History Narrative  . Not on file   Social Determinants of Health   Financial Resource Strain: Low Risk   . Difficulty of Paying Living Expenses: Not hard at all  Food Insecurity: No Food Insecurity  . Worried About Charity fundraiser in the Last Year: Never true  . Ran Out of Food in the Last Year: Never true  Transportation Needs: No Transportation Needs  . Lack of Transportation (Medical): No  . Lack of Transportation (Non-Medical): No  Physical Activity: Inactive  . Days of  Exercise per Week: 0 days  . Minutes of Exercise per Session: 0 min  Stress: No Stress Concern Present  . Feeling of Stress : Not at all  Social Connections: Moderately Integrated  . Frequency of Communication with Friends and Family: Twice a week  . Frequency of Social Gatherings with Friends and Family: More than three times a week  . Attends Religious Services: More than 4 times per year  . Active Member of Clubs or Organizations: No  . Attends Archivist Meetings: Never  . Marital Status: Married  Human resources officer Violence: Not At Risk  . Fear of Current or Ex-Partner: No  . Emotionally Abused: No  . Physically Abused: No  . Sexually Abused: No    FAMILY HISTORY: Family History  Problem Relation Age of Onset  . Hypertension Other   . Cancer Other   . Colon cancer Neg Hx     ALLERGIES:  has No Known Allergies.  MEDICATIONS:  Current Outpatient Medications  Medication Sig Dispense Refill  . aspirin-acetaminophen-caffeine (EXCEDRIN MIGRAINE) 250-250-65 MG tablet Take 1 tablet by mouth every 8 (eight) hours as needed for migraine.    Marland Kitchen atorvastatin (LIPITOR) 10 MG tablet Take 1 tablet (10 mg total) by mouth daily. 90 tablet 3  . Blood Glucose Monitoring Suppl (ONE TOUCH ULTRA 2) w/Device KIT Use to check blood sugars once daily. 1 kit 0  . canagliflozin (INVOKANA) 300 MG TABS tablet Take 1 tablet (300 mg total) by mouth daily before breakfast. 30 tablet 11  . CHELATED ZINC PO Take 1 capsule by mouth daily.    . Cholecalciferol (VITAMIN D3) 2000 units capsule Take 2,000 Units by mouth daily.     . Cyanocobalamin (VITAMIN B 12 PO) Take 2,500 mcg by mouth daily.     Marland Kitchen esomeprazole (NEXIUM) 40 MG capsule TAKE 1 CAPSULE BY MOUTH EVERY DAY 90 capsule 3  . FOLIC ACID PO Take 998 mcg by mouth daily.     Marland Kitchen glipiZIDE (GLUCOTROL) 10 MG tablet Take 1 tablet (10 mg total) by mouth 2 (two) times daily before a meal. 180 tablet 3  . glucose blood test strip USE ONCE A DAY TO TEST  BLOOD SUGAR 100 each 3  . GNP GARLIC EXTRACT PO Take 338 mg by mouth daily.    . Levomefolate Glucosamine (METHYLFOLATE PO) Take 1 tablet by mouth daily.    Marland Kitchen losartan (COZAAR) 100 MG tablet Take 1 tablet (100 mg total) by mouth daily. 90 tablet 3  . Omega-3 Fatty Acids (FISH OIL) 1000 MG CAPS Take by mouth daily.     Glory Rosebush Delica Lancets 25K MISC USE ONCE A  DAY WHEN CHECKING BLOOD SUGAR 100 each 3  . pyridOXINE (VITAMIN B-6) 100 MG tablet Take 100 mg by mouth daily.    Marland Kitchen SYNTHROID 137 MCG tablet Take 1 tablet (137 mcg total) by mouth daily before breakfast. 90 tablet 3  . tamsulosin (FLOMAX) 0.4 MG CAPS capsule Take 2 capsules (0.8 mg total) by mouth daily. 180 capsule 3  . temazepam (RESTORIL) 30 MG capsule Take 1 capsule (30 mg total) by mouth at bedtime as needed for sleep. 30 capsule 5   No current facility-administered medications for this visit.    REVIEW OF SYSTEMS:   10 Point review of Systems was done is negative except as noted above.  PHYSICAL EXAMINATION: ECOG PERFORMANCE STATUS: 1 - Symptomatic but completely ambulatory  . Vitals:   04/09/21 1431  BP: 130/64  Pulse: 81  Resp: 18  Temp: 97.7 F (36.5 C)  SpO2: 100%   Filed Weights   04/09/21 1431  Weight: 214 lb (97.1 kg)   .Body mass index is 29.85 kg/m.    GENERAL:alert, in no acute distress and comfortable SKIN: no acute rashes, no significant lesions EYES: conjunctiva are pink and non-injected, sclera anicteric OROPHARYNX: MMM, no exudates, no oropharyngeal erythema or ulceration NECK: supple, no JVD LYMPH:  no palpable lymphadenopathy in the cervical, axillary or inguinal regions LUNGS: clear to auscultation b/l with normal respiratory effort HEART: regular rate & rhythm ABDOMEN:  normoactive bowel sounds , non tender, not distended. Extremity: no pedal edema PSYCH: alert & oriented x 3 with fluent speech NEURO: no focal motor/sensory deficits   LABORATORY DATA:  I have reviewed the data as  listed  . CBC Latest Ref Rng & Units 03/27/2021 02/25/2021 10/28/2020  WBC 4.0 - 10.5 K/uL 4.7 3.8(L) 6.4  Hemoglobin 13.0 - 17.0 g/dL 13.3 13.4 13.7  Hematocrit 39.0 - 52.0 % 40.0 39.6 40.4  Platelets 150 - 400 K/uL 209 189 230   . CBC    Component Value Date/Time   WBC 4.7 03/27/2021 0718   RBC 4.32 03/27/2021 0718   HGB 13.3 03/27/2021 0718   HCT 40.0 03/27/2021 0718   PLT 209 03/27/2021 0718   MCV 92.6 03/27/2021 0718   MCH 30.8 03/27/2021 0718   MCHC 33.3 03/27/2021 0718   RDW 14.4 03/27/2021 0718   LYMPHSABS 1.6 02/25/2021 0808   MONOABS 0.4 02/25/2021 0808   EOSABS 0.1 02/25/2021 0808   BASOSABS 0.0 02/25/2021 0808    . CMP Latest Ref Rng & Units 02/25/2021 11/01/2020 10/28/2020  Glucose 70 - 99 mg/dL 185(H) 291(H) 143(H)  BUN 8 - 23 mg/dL 30(H) 29(H) 31(H)  Creatinine 0.61 - 1.24 mg/dL 1.89(H) 1.78(H) 1.63(H)  Sodium 135 - 145 mmol/L 137 135 135  Potassium 3.5 - 5.1 mmol/L 4.4 4.5 4.5  Chloride 98 - 111 mmol/L 103 104 99  CO2 22 - 32 mmol/L $RemoveB'23 22 25  'VlDKWOTN$ Calcium 8.9 - 10.3 mg/dL 8.8(L) 9.0 9.2  Total Protein 6.5 - 8.1 g/dL 8.6(H) 8.1 8.1  Total Bilirubin 0.3 - 1.2 mg/dL 0.6 0.5 0.7  Alkaline Phos 38 - 126 U/L 76 87 -  AST 15 - 41 U/L 13(L) 13(L) 16  ALT 0 - 44 U/L $Remo'19 19 25   'ovAMT$ Component     Latest Ref Rng & Units 05/04/2018  Iron     42 - 163 ug/dL 75  TIBC     202 - 409 ug/dL 255  Saturation Ratios     42 - 163 % 29 (L)  UIBC  ug/dL 180  LDH     125 - 245 U/L 143  Sed Rate     0 - 16 mm/hr 20 (H)  Ferritin     22 - 316 ng/mL 167  Intrinsic Factor     0.0 - 1.1 AU/mL 1.0  Parietal Cell Antibody-IgG     0.0 - 20.0 Units 1.9   Component     Latest Ref Rng & Units 05/09/2018  Total Protein, Urine-UPE24     Not Estab. mg/dL 7.6  Total Protein, Urine-Ur/day     30 - 150 mg/24 hr 148  ALBUMIN, U     % 24.2  ALPHA 1 URINE     % 3.9  Alpha 2, Urine     % 12.7  % BETA, Urine     % 26.6  GAMMA GLOBULIN URINE     % 32.6  Free Kappa Lt Chains,Ur      1.35 - 24.19 mg/L 82.60 (H)  Free Lambda Lt Chains,Ur     0.24 - 6.66 mg/L 16.80 (H)  Free Kappa/Lambda Ratio     2.04 - 10.37 4.92  Immunofixation Result, Urine      Comment  Total Volume      1,950  M-SPIKE %, Urine     Not Observed % Not Observed  NOTE:      Comment      OUTSIDE LABS 04/21/18       03/27/2021 BM Bx   DIAGNOSIS:   BONE MARROW, ASPIRATE, CLOT, CORE:  - Hypercellular bone marrow with involvement by plasma cell neoplasm  - See comment    COMMENT:   Plasma cells are increased on aspirate smears (8% by manual differential  count) and by CD138 immunohistochemistry on the core biopsy and clot  section (15%). Plasma cells express lambda-restricted light chains by  in situ hybridization. Together the findings are consistent with bone  marrow involvement by a lambda-restricted plasma cell neoplasm.  Correlation with clinical impressions, radiographic studies, other  laboratory data, and cytogenetic/FISH results is recommended.   03/27/2021 Molecular Pathology    03/27/2021 Cytogenetics    RADIOGRAPHIC STUDIES: I have personally reviewed the radiological images as listed and agreed with the findings in the report. NM PET Image Initial (PI) Whole Body  Result Date: 03/25/2021 CLINICAL DATA:  Initial treatment strategy for multiple myeloma. EXAM: NUCLEAR MEDICINE PET WHOLE BODY TECHNIQUE: 10.5 mCi F-18 FDG was injected intravenously. Full-ring PET imaging was performed from the head to foot after the radiotracer. CT data was obtained and used for attenuation correction and anatomic localization. Fasting blood glucose: 133 mg/dl COMPARISON:  None. FINDINGS: Mediastinal blood pool activity: SUV max 2.6 HEAD/NECK: No hypermetabolic activity in the scalp. No hypermetabolic cervical lymph nodes. Incidental CT findings: none CHEST: No hypermetabolic mediastinal or hilar nodes. No suspicious pulmonary nodules on the CT scan. Incidental CT findings: none  ABDOMEN/PELVIS: No abnormal hypermetabolic activity within the liver, pancreas, adrenal glands, or spleen. No hypermetabolic lymph nodes in the abdomen or pelvis. Incidental CT findings: none SKELETON: No focal hypermetabolic activity to suggest skeletal metastasis. Incidental CT findings: Posterior lumbar fusion. EXTREMITIES: No abnormal hypermetabolic activity in the lower extremities. Incidental CT findings: none IMPRESSION: 1. No evidence of active multiple myeloma in  the skeleton. 2. No lytic lesions CT exam Electronically Signed   By: Suzy Bouchard M.D.   On: 03/25/2021 12:47   CT Biopsy  Result Date: 03/27/2021 INDICATION: Myeloma workup.  IgG lambda monoclonal paraproteinemia. EXAM: CT GUIDED BONE MARROW  ASPIRATES AND BIOPSY Physician: Stephan Minister. Anselm Pancoast, MD MEDICATIONS: None. ANESTHESIA/SEDATION: Fentanyl 100 mcg IV; Versed 4.0 mg IV Moderate Sedation Time:  15 minutes The patient was continuously monitored during the procedure by the interventional radiology nurse under my direct supervision. COMPLICATIONS: None immediate. PROCEDURE: The procedure was explained to the patient. The risks and benefits of the procedure were discussed and the patient's questions were addressed. Informed consent was obtained from the patient. The patient was placed prone on CT table. Images of the pelvis were obtained. The right side of back was prepped and draped in sterile fashion. The skin and right posterior ilium were anesthetized with 1% lidocaine. OnControl bone needle was directed into the right ilium with CT guidance. Two aspirates were obtained. One core biopsy was obtained with the bone drill. Bandage placed over the puncture site. FINDINGS: Bone needle directed in the posterior right ilium. IMPRESSION: CT guided bone marrow aspiration and core biopsy. Electronically Signed   By: Markus Daft M.D.   On: 03/27/2021 14:59   CT BONE MARROW BIOPSY & ASPIRATION  Result Date: 03/27/2021 INDICATION: Myeloma workup.  IgG  lambda monoclonal paraproteinemia. EXAM: CT GUIDED BONE MARROW ASPIRATES AND BIOPSY Physician: Stephan Minister. Anselm Pancoast, MD MEDICATIONS: None. ANESTHESIA/SEDATION: Fentanyl 100 mcg IV; Versed 4.0 mg IV Moderate Sedation Time:  15 minutes The patient was continuously monitored during the procedure by the interventional radiology nurse under my direct supervision. COMPLICATIONS: None immediate. PROCEDURE: The procedure was explained to the patient. The risks and benefits of the procedure were discussed and the patient's questions were addressed. Informed consent was obtained from the patient. The patient was placed prone on CT table. Images of the pelvis were obtained. The right side of back was prepped and draped in sterile fashion. The skin and right posterior ilium were anesthetized with 1% lidocaine. OnControl bone needle was directed into the right ilium with CT guidance. Two aspirates were obtained. One core biopsy was obtained with the bone drill. Bandage placed over the puncture site. FINDINGS: Bone needle directed in the posterior right ilium. IMPRESSION: CT guided bone marrow aspiration and core biopsy. Electronically Signed   By: Markus Daft M.D.   On: 03/27/2021 14:59    ASSESSMENT & PLAN:   Troy Howard is a 80 y.o. caucasian male with    1. IgG Lambda smoldering myeloma He has a M-spike of 0.8g/dl and normal SFLC and ratio. No anemia - nl Hgb No focal bone pains --- Skeletal survey - shows no  No hypercalcemia. 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  05/10/18 Metastatic Bone Survey revealed no lytic lesions on skeletal survey to suggest multiple myeloma.   PLAN:  -Discussed pt PET Whole Body ([id number]) on 03/25/2021; no evidence of active multiple myeloma. -Discussed pt CT Bone Marrow Biopsy & Aspiration on 03/27/2021; at least 15% of plasma cells.  -Advised pt that based on the Bm Bx results, we would  consider this to be at least Smoldering Multiple Myeloma due to presence of greater than 10% and less than 60% plasma cells. -Discussed CRAB criteria: no hypercalcemia, no renal disorder related to myeloma, no anemia, and no bone lesions. -Advised pt that smoldering myeloma is considered inactive at this time given meeting no CRAB criteria. Will continue with watchful observation at this time. -Advised pt that chance of progression into active myeloma would be 5-20% each year. -Discussed typical treatment for active  myeloma.  -Recommended pt eat healthy, sleep well, de-stress, and stay physically active. -Recommend pt f/u with PCP for Diabetes and HTN management.  -Recommended pt continue to f/u w Dr. Hollie Salk at [facility] to evaluate if abnormal protein is affecting the kidney. -Continue 2000 IU Vitamin D daily. -Will see back in 4 months with labs 1 week prior.    2. 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 Hollie Salk  -mild non nephrotic proteinuria on 24h UPEP-Continue follow up with Dr Hollie Salk for CKD    3. Vitamin B12 deficient  Antibody testing done - no evidence of pernicious anemia. No evidence of pernicious anemia based on neg IF and parietal cell ab -He has been actively losing weight by eating healthy with no diet restrictions.  -I discussed the possible cause of this deficiency such as long term use of Nexium and Metformin. He is no longer on Metformin.  -continue B12 replacement per PCP  4.  Past Medical History:  Diagnosis Date  . Arthritis    back  . Cancer [facility])    prostate, sees Dr. Raynelle Bring   . Diabetes mellitus   . Erectile dysfunction   . GERD (gastroesophageal reflux disease)   . Hyperlipidemia   . Hypertension   . Migraines   . Peptic ulcer     FOLLOW UP: RTC w Dr Irene Limbo in 4 months. Please schedule labs 1 week prior.   The total time spent in the appt was 20 minutes and more than 50% was on counseling and direct patient  cares.  All of the patient's questions were answered with apparent satisfaction. The patient knows to call the clinic with any problems, questions or concerns.    Sullivan Lone MD [place] AAHIVMS [facility] Hematology/Oncology Physician [facility]  (Office):       [phone] (Work cell):  [phone] (Fax):           [phone]  04/09/2021 2:58 PM  I, Reinaldo Raddle, am acting as scribe for Dr. Sullivan Lone, MD.   .I have reviewed the above documentation for accuracy and completeness, and I agree with the above. [name] MD

## 2021-04-09 ENCOUNTER — Other Ambulatory Visit: Payer: Self-pay

## 2021-04-09 ENCOUNTER — Inpatient Hospital Stay: Payer: PPO | Attending: Hematology | Admitting: Hematology

## 2021-04-09 VITALS — BP 130/64 | HR 81 | Temp 97.7°F | Resp 18 | Ht 71.0 in | Wt 214.0 lb

## 2021-04-09 DIAGNOSIS — C9000 Multiple myeloma not having achieved remission: Secondary | ICD-10-CM | POA: Diagnosis not present

## 2021-04-09 DIAGNOSIS — D472 Monoclonal gammopathy: Secondary | ICD-10-CM | POA: Insufficient documentation

## 2021-04-11 ENCOUNTER — Telehealth: Payer: Self-pay | Admitting: Pharmacist

## 2021-04-11 NOTE — Chronic Care Management (AMB) (Signed)
Chronic Care Management Pharmacy Assistant   Name: Troy Howard  MRN: [id number] DOB: February 21, 1941  Reason for Encounter: Disease State/ Diabetes Assessment Call.    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   Recent consult visits:  04/09/21 [name] (Oncology) - referred by [name] PCP. No medication changes and no follow up noted.   03/04/21 [name] (Oncology) - presented to clinic for multiple myeloma not having achieved remission. Referral made for PET scan and Bone marrow biopsy. No medication changes and follow up in 3 weeks.   Hospital visits:  None in previous 6 months  Medications: Outpatient Encounter Medications as of 04/11/2021  Medication Sig Note  . aspirin-acetaminophen-caffeine (EXCEDRIN MIGRAINE) 250-250-65 MG tablet Take 1 tablet by mouth every 8 (eight) hours as needed for migraine.   Marland Kitchen atorvastatin (LIPITOR) 10 MG tablet Take 1 tablet (10 mg total) by mouth daily.   . Blood Glucose Monitoring Suppl (ONE TOUCH ULTRA 2) w/Device KIT Use to check blood sugars once daily.   . canagliflozin (INVOKANA) 300 MG TABS tablet Take 1 tablet (300 mg total) by mouth daily before breakfast.   . CHELATED ZINC PO Take 1 capsule by mouth daily.   . Cholecalciferol (VITAMIN D3) 2000 units capsule Take 2,000 Units by mouth daily.    . Cyanocobalamin (VITAMIN B 12 PO) Take 2,500 mcg by mouth daily.    Marland Kitchen esomeprazole (NEXIUM) 40 MG capsule TAKE 1 CAPSULE BY MOUTH EVERY DAY   . FOLIC ACID PO Take 800 mcg by mouth daily.    Marland Kitchen glipiZIDE (GLUCOTROL) 10 MG tablet Take 1 tablet (10 mg total) by mouth 2 (two) times daily before a meal.   . glucose blood test strip USE ONCE A DAY TO TEST BLOOD SUGAR   . GNP GARLIC EXTRACT PO Take 800 mg by mouth daily.   . Levomefolate Glucosamine (METHYLFOLATE PO) Take 1 tablet by mouth daily.   Marland Kitchen losartan (COZAAR) 100 MG tablet Take 1 tablet (100 mg total) by mouth daily.   . Omega-3  Fatty Acids (FISH OIL) 1000 MG CAPS Take by mouth daily.    Letta Pate Delica Lancets 30G MISC USE ONCE A DAY WHEN CHECKING BLOOD SUGAR   . pyridOXINE (VITAMIN B-6) 100 MG tablet Take 100 mg by mouth daily.   Marland Kitchen SYNTHROID 137 MCG tablet Take 1 tablet (137 mcg total) by mouth daily before breakfast.   . tamsulosin (FLOMAX) 0.4 MG CAPS capsule Take 2 capsules (0.8 mg total) by mouth daily. 03/27/2021: No longer taking  . temazepam (RESTORIL) 30 MG capsule Take 1 capsule (30 mg total) by mouth at bedtime as needed for sleep.    No facility-administered encounter medications on file as of 04/11/2021.    Recent Relevant Labs: Lab Results  Component Value Date/Time   HGBA1C 7.2 (A) 02/11/2021 08:56 AM   HGBA1C 8.7 (H) 10/28/2020 01:37 PM   HGBA1C 7.0 (H) 10/24/2019 02:19 PM   MICROALBUR 7.3 (H) 08/14/2014 10:11 AM   MICROALBUR 1.1 03/22/2012 09:32 AM    Kidney Function Lab Results  Component Value Date/Time   CREATININE 1.89 (H) 02/25/2021 08:08 AM   CREATININE 1.78 (H) 11/01/2020 09:01 AM   CREATININE 1.63 (H) 10/28/2020 01:37 PM   GFR 47.18 (L) 10/24/2019 02:19 PM   GFRNONAA 36 (L) 02/25/2021 08:08 AM   GFRNONAA 40 (L) 10/28/2020 01:37 PM   GFRAA 46 (L) 10/28/2020 01:37 PM    . Current  antihyperglycemic regimen:   Invokana 300 mg 1 tablet daily before breakfast  glipizide 10 mg, 1 tablet twice dailybefore meals  . What recent interventions/DTPs have been made to improve glycemic control:  o None.   . Have there been any recent hospitalizations or ED visits since last visit with CPP? No  . Patient denies hypoglycemic symptoms.  . Patient denies hyperglycemic symptoms.  . How often are you checking your blood sugar? once daily  . What are your blood sugars ranging? Patient 14 day average on meter is 122.  o Fasting: 70-130 o Before meals: none.  o After meals: 140-150 o Bedtime: none.   . During the week, how often does your blood glucose drop below 70? Never  . Are you  checking your feet daily/regularly? Yes. Checks everyday.  Adherence Review: Is the patient currently on a STATIN medication? Yes Is the patient currently on ACE/ARB medication? Yes Does the patient have >5 day gap between last estimated fill dates? No  Notes:  Spoke with patient who states that he has lost over 30 pounds and has changed his eating habits. Patient states he does not eat a lot of carbohydrates and has a serving of fruit everyday. Patient states he plays golf twice a week and does all his own yard work. Patient reports his blood sugar has not been above 150 in the last month and since losing weight. Went over all medications with patient and he reports as taking them and no issues from medications.  Star Rating Drugs:   Glipizide $RemoveBe'10mg'LtyhfGyTT$  - last filled on 01/24/21 90DS at [facility]  Atorvastatin $RemoveBefor'10mg'[id number]$  - last filled on 02/20/21 90DS at [facility]  Invokana $RemoveB'300mg'annYWqzI$  - last filled on 03/19/21 30DS at [facility]  Losartan $RemoveB'100mg'eNCgOMLd$  - last filled on 04/02/21 90DS at [facility] [phone]

## 2021-04-15 NOTE — Telephone Encounter (Cosign Needed)
Left message with wife and vm. 2 attempts

## 2021-05-08 ENCOUNTER — Other Ambulatory Visit: Payer: Self-pay

## 2021-05-08 ENCOUNTER — Ambulatory Visit (AMBULATORY_SURGERY_CENTER): Payer: PPO

## 2021-05-08 VITALS — Ht 71.0 in | Wt 215.0 lb

## 2021-05-08 DIAGNOSIS — Z86010 Personal history of colonic polyps: Secondary | ICD-10-CM

## 2021-05-08 MED ORDER — NA SULFATE-K SULFATE-MG SULF 17.5-3.13-1.6 GM/177ML PO SOLN: 1.0000 | Freq: Once | ORAL | 0 refills | Status: AC

## 2021-05-08 NOTE — Progress Notes (Signed)

## 2021-05-13 ENCOUNTER — Telehealth: Payer: Self-pay | Admitting: Pharmacist

## 2021-05-13 NOTE — Chronic Care Management (AMB) (Signed)
Chronic Care Management Pharmacy Assistant   Name: Troy Howard  MRN: [id number] DOB: [birth year]  Reason for Encounter: Disease State/ Diabetes Assessment Call.    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   Recent consult visits:  None.   Hospital visits:  None in previous 6 months  Medications: Outpatient Encounter Medications as of 05/13/2021  Medication Sig Note   aspirin-acetaminophen-caffeine (EXCEDRIN MIGRAINE) 250-250-65 MG tablet Take 1 tablet by mouth every 8 (eight) hours as needed for migraine.    atorvastatin (LIPITOR) 10 MG tablet Take 1 tablet (10 mg total) by mouth daily.    Blood Glucose Monitoring Suppl (ONE TOUCH ULTRA 2) w/Device KIT Use to check blood sugars once daily.    canagliflozin (INVOKANA) 300 MG TABS tablet Take 1 tablet (300 mg total) by mouth daily before breakfast.    CHELATED ZINC PO Take 1 capsule by mouth daily.    Cholecalciferol (VITAMIN D3) 2000 units capsule Take 2,000 Units by mouth daily.     Cyanocobalamin (VITAMIN B 12 PO) Take 2,500 mcg by mouth daily.     esomeprazole (NEXIUM) 40 MG capsule TAKE 1 CAPSULE BY MOUTH EVERY DAY    FOLIC ACID PO Take 295 mcg by mouth daily.     glipiZIDE (GLUCOTROL) 10 MG tablet Take 1 tablet (10 mg total) by mouth 2 (two) times daily before a meal.    glucose blood test strip USE ONCE A DAY TO TEST BLOOD SUGAR    GNP GARLIC EXTRACT PO Take 284 mg by mouth daily.    JANUVIA 100 MG tablet Take 100 mg by mouth daily.    Levomefolate Glucosamine (METHYLFOLATE PO) Take 1 tablet by mouth daily.    losartan (COZAAR) 100 MG tablet Take 1 tablet (100 mg total) by mouth daily.    Omega-3 Fatty Acids (FISH OIL) 1000 MG CAPS Take by mouth daily.     OneTouch Delica Lancets 13K MISC USE ONCE A DAY WHEN CHECKING BLOOD SUGAR    pyridOXINE (VITAMIN B-6) 100 MG tablet Take 100 mg by mouth daily.    SYNTHROID 137 MCG tablet Take 1 tablet (137 mcg total) by mouth daily before  breakfast.    tamsulosin (FLOMAX) 0.4 MG CAPS capsule Take 2 capsules (0.8 mg total) by mouth daily. 03/27/2021: No longer taking   temazepam (RESTORIL) 30 MG capsule Take 1 capsule (30 mg total) by mouth at bedtime as needed for sleep.    No facility-administered encounter medications on file as of 05/13/2021.    Recent Relevant Labs: Lab Results  Component Value Date/Time   HGBA1C 7.2 (A) 02/11/2021 08:56 AM   HGBA1C 8.7 (H) 10/28/2020 01:37 PM   HGBA1C 7.0 (H) 10/24/2019 02:19 PM   MICROALBUR 7.3 (H) 08/14/2014 10:11 AM   MICROALBUR 1.1 03/22/2012 09:32 AM    Kidney Function Lab Results  Component Value Date/Time   CREATININE 1.89 (H) 02/25/2021 08:08 AM   CREATININE 1.78 (H) 11/01/2020 09:01 AM   CREATININE 1.63 (H) 10/28/2020 01:37 PM   GFR 47.18 (L) 10/24/2019 02:19 PM   GFRNONAA 36 (L) 02/25/2021 08:08 AM   GFRNONAA 40 (L) 10/28/2020 01:37 PM   GFRAA 46 (L) 10/28/2020 01:37 PM    Current antihyperglycemic regimen:  Invokana 300 mg 1 tablet daily before breakfast  glipizide 10 mg, 1 tablet twice daily before meals Januvia 138m - 1 tablet daily.  What recent interventions/DTPs have been made to improve glycemic control:  None.  Have there been  any recent hospitalizations or ED visits since last visit with CPP? No Patient denies hypoglycemic symptoms, including None Patient denies hyperglycemic symptoms, including none How often are you checking your blood sugar? once daily What are your blood sugars ranging? 70-150. Patient states he checks at different times.  Fasting:not sure Before meals: not sure After meals: not sure Bedtime: not sure During the week, how often does your blood glucose drop below 70? Never Are you checking your feet daily/regularly? Yes checks them everyday.   Adherence Review: Is the patient currently on a STATIN medication? Yes Is the patient currently on ACE/ARB medication? Yes Does the patient have >5 day gap between last estimated fill  dates? No  Notes: Spoke with patient and reviewed all medications as listed. Patient is taking all medications as prescribed and reports no issues at this time. Patient is checking his blood sugar but has not been writing them down and and he checks at random times everyday. Patient reports his average is as above. Patient states for breakfast he tends to have something like a banana and 2 boiled eggs with water. Patient has lost 32 pounds and wants to get to 40. Patient states he drinks 2 cups pf coffee a day 6-7 16 ounce bottles of water a day. For lunch patient typically has at least 2 salad per week and always eats lunch at home and has a piece of fruit and last week he had grilled shrimp. He sometimes has roast beef sandwich. He loves to snack on cherry tomatoes and raw vegetables like broccoli, cauliflower and carrots. He prefers them raw. For dinner he has chicken about once a week and fish and homemade slaw. He tries to stay away from red meat and real sugar. He always has at least one piece of fruit a day. His daughter recently  brought him some peaches from [place] and he's been one a day. 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 He plays golf 2-3 times a week and does all the mowing and weed eating at home himself. I encouraged patient to start writing his blood sugars and the time of day down. Patient was agreeable and patient thanked me for my call.  Star Rating Drugs:  Glipizide 21m - last filled on 04/28/21 90DS at [facility] Atorvastatin 148m- last filled on 02/20/21 90DS at [facility] Invokana 30064m last filled on 04/21/21 30DS at [facility] Losartan 100m60mlast filled on 04/02/21 90[facility] at [facility] Sitagliptin 100mg38mast filled on 05/01/21 90[facility] at [facility]  Jacobusmacist Assistant [phone]

## 2021-05-20 ENCOUNTER — Other Ambulatory Visit: Payer: Self-pay

## 2021-05-20 ENCOUNTER — Other Ambulatory Visit (INDEPENDENT_AMBULATORY_CARE_PROVIDER_SITE_OTHER): Payer: PPO

## 2021-05-20 DIAGNOSIS — E119 Type 2 diabetes mellitus without complications: Secondary | ICD-10-CM

## 2021-05-20 LAB — LIPID PANEL
Cholesterol: 118 mg/dL (ref 0–200)
HDL: 17 mg/dL — ABNORMAL LOW (ref >39.00)
NonHDL: 101.13
Total CHOL/HDL Ratio: 7
Triglycerides: 261 mg/dL — ABNORMAL HIGH (ref 0.0–149.0)
VLDL: 52.2 mg/dL — ABNORMAL HIGH (ref 0.0–40.0)

## 2021-05-20 LAB — HEMOGLOBIN A1C: Hgb A1c MFr Bld: 7.2 % — ABNORMAL HIGH (ref 4.6–6.5)

## 2021-05-20 LAB — LDL CHOLESTEROL, DIRECT: Direct LDL: 55 mg/dL

## 2021-05-20 LAB — HEPATIC FUNCTION PANEL
ALT: 12 U/L (ref 0–53)
AST: 11 U/L (ref 0–37)
Albumin: 3.8 g/dL (ref 3.5–5.2)
Alkaline Phosphatase: 74 U/L (ref 39–117)
Bilirubin, Direct: 0.1 mg/dL (ref 0.0–0.3)
Total Bilirubin: 0.7 mg/dL (ref 0.2–1.2)
Total Protein: 7.8 g/dL (ref 6.0–8.3)

## 2021-05-22 ENCOUNTER — Other Ambulatory Visit: Payer: Self-pay

## 2021-05-22 ENCOUNTER — Encounter: Payer: Self-pay | Admitting: Gastroenterology

## 2021-05-22 ENCOUNTER — Ambulatory Visit (AMBULATORY_SURGERY_CENTER): Payer: PPO | Admitting: Gastroenterology

## 2021-05-22 VITALS — BP 129/55 | HR 53 | Temp 98.6°F | Resp 11 | Ht 71.0 in | Wt 215.0 lb

## 2021-05-22 DIAGNOSIS — D122 Benign neoplasm of ascending colon: Secondary | ICD-10-CM

## 2021-05-22 DIAGNOSIS — D123 Benign neoplasm of transverse colon: Secondary | ICD-10-CM

## 2021-05-22 DIAGNOSIS — D124 Benign neoplasm of descending colon: Secondary | ICD-10-CM

## 2021-05-22 DIAGNOSIS — K635 Polyp of colon: Secondary | ICD-10-CM

## 2021-05-22 DIAGNOSIS — Z86010 Personal history of colonic polyps: Secondary | ICD-10-CM | POA: Diagnosis not present

## 2021-05-22 DIAGNOSIS — K5140 Inflammatory polyps of colon without complications: Secondary | ICD-10-CM

## 2021-05-22 DIAGNOSIS — Z1211 Encounter for screening for malignant neoplasm of colon: Secondary | ICD-10-CM | POA: Diagnosis not present

## 2021-05-22 HISTORY — PX: COLONOSCOPY: SHX174

## 2021-05-22 MED ORDER — SODIUM CHLORIDE 0.9 % IV SOLN: 500.0000 mL | Freq: Once | INTRAVENOUS | Status: DC

## 2021-05-22 NOTE — Patient Instructions (Signed)
Handouts given:  polyps, Diverticulosis, hemorrhoids Start a higher fiber diet Continue current medications Await pathology results  YOU HAD AN ENDOSCOPIC PROCEDURE TODAY AT [place]:   Refer to the procedure report that was given to you for any specific questions about what was found during the examination.  If the procedure report does not answer your questions, please call your gastroenterologist to clarify.  If you requested that your care partner not be given the details of your procedure findings, then the procedure report has been included in a sealed envelope for you to review at your convenience later.  YOU SHOULD EXPECT: Some feelings of bloating in the abdomen. Passage of more gas than usual.  Walking can help get rid of the air that was put into your GI tract during the procedure and reduce the bloating. If you had a lower endoscopy (such as a colonoscopy or flexible sigmoidoscopy) you may notice spotting of blood in your stool or on the toilet paper. If you underwent a bowel prep for your procedure, you may not have a normal bowel movement for a few days.  Please Note:  You might notice some irritation and congestion in your nose or some drainage.  This is from the oxygen used during your procedure.  There is no need for concern and it should clear up in a day or so.  SYMPTOMS TO REPORT IMMEDIATELY:  Following lower endoscopy (colonoscopy or flexible sigmoidoscopy):  Excessive amounts of blood in the stool  Significant tenderness or worsening of abdominal pains  Swelling of the abdomen that is new, acute  Fever of 100F or higher  For urgent or emergent issues, a gastroenterologist can be reached at any hour by calling [phone]. Do not use MyChart messaging for urgent concerns.   DIET:  We do recommend a small meal at first, but then you may proceed to your regular diet.  Drink plenty of fluids but you should avoid alcoholic beverages for 24  hours.  ACTIVITY:  You should plan to take it easy for the rest of today and you should NOT DRIVE or use heavy machinery until tomorrow (because of the sedation medicines used during the test).    FOLLOW UP: Our staff will call the number listed on your records 48-72 hours following your procedure to check on you and address any questions or concerns that you may have regarding the information given to you following your procedure. If we do not reach you, we will leave a message.  We will attempt to reach you two times.  During this call, we will ask if you have developed any symptoms of COVID 19. If you develop any symptoms (ie: fever, flu-like symptoms, shortness of breath, cough etc.) before then, please call [phone].  If you test positive for Covid 19 in the 2 weeks post procedure, please call and report this information to [place].    If any biopsies were taken you will be contacted by phone or by letter within the next 1-3 weeks.  Please call us at [phone] if you have not heard about the biopsies in 3 weeks.   SIGNATURES/CONFIDENTIALITY: You and/or your care partner have signed paperwork which will be entered into your electronic medical record.  These signatures attest to the fact that that the information above on your After Visit Summary has been reviewed and is understood.  Full responsibility of the confidentiality of this discharge information lies with you and/or your care-partner.

## 2021-05-22 NOTE — Progress Notes (Signed)
Medical history reviewed with no changes noted. VS assessed by C.W 

## 2021-05-22 NOTE — Progress Notes (Signed)
Called to room to assist during endoscopic procedure.  Patient ID and intended procedure confirmed with present staff. Received instructions for my participation in the procedure from the performing physician.  

## 2021-05-22 NOTE — Op Note (Signed)
[facility] Patient Name: Troy Howard Procedure Date: 05/22/2021 11:28 AM MRN: [id number] Endoscopist: Ladene Artist , MD Age: 80 Referring MD:  Date of Birth: 1941-08-10 Gender: Male Account #: [id number] Procedure:                Colonoscopy Indications:              Surveillance: Personal history of adenomatous                            polyps on last colonoscopy > 5 years ago Medicines:                Monitored Anesthesia Care Procedure:                Pre-Anesthesia Assessment:                           - Prior to the procedure, a History and Physical                            was performed, and patient medications and                            allergies were reviewed. The patient's tolerance of                            previous anesthesia was also reviewed. The risks                            and benefits of the procedure and the sedation                            options and risks were discussed with the patient.                            All questions were answered, and informed consent                            was obtained. Prior Anticoagulants: The patient has                            taken no previous anticoagulant or antiplatelet                            agents. ASA Grade Assessment: II - A patient with                            mild systemic disease. After reviewing the risks                            and benefits, the patient was deemed in                            satisfactory condition to undergo the procedure.  After obtaining informed consent, the colonoscope                            was passed under direct vision. Throughout the                            procedure, the patient's blood pressure, pulse, and                            oxygen saturations were monitored continuously. The                            Olympus CF-HQ190 [id number]) Colonoscope was                            introduced through the  anus and advanced to the the                            cecum, identified by appendiceal orifice and                            ileocecal valve. The ileocecal valve, appendiceal                            orifice, and rectum were photographed. The quality                            of the bowel preparation was good. The colonoscopy                            was performed without difficulty. The patient                            tolerated the procedure well. Scope In: 11:34:15 AM Scope Out: 11:50:17 AM Scope Withdrawal Time: 0 hours 13 minutes 26 seconds  Total Procedure Duration: 0 hours 16 minutes 2 seconds  Findings:                 The perianal and digital rectal examinations were                            normal.                           Three sessile polyps were found in the descending                            colon, transverse colon and ascending colon. The                            polyps were 6 to 7 mm in size. These polyps were                            removed with a cold snare. Resection and retrieval  were complete.                           Multiple medium-mouthed diverticula were found in                            the left colon. There was no evidence of                            diverticular bleeding.                           Internal hemorrhoids were found during                            retroflexion. The hemorrhoids were small and Grade                            I (internal hemorrhoids that do not prolapse).                           The exam was otherwise without abnormality on                            direct and retroflexion views. Complications:            No immediate complications. Estimated blood loss:                            None. Estimated Blood Loss:     Estimated blood loss: none. Impression:               - Three 6 to 7 mm polyps in the descending colon,                            in the transverse colon and in the  ascending colon,                            removed with a cold snare. Resected and retrieved.                           - Moderate diverticulosis in the left colon.                           - Internal hemorrhoids.                           - The examination was otherwise normal on direct                            and retroflexion views. Recommendation:           - Patient has a contact number available for                            emergencies. The signs and symptoms of potential  delayed complications were discussed with the                            patient. Return to normal activities tomorrow.                            Written discharge instructions were provided to the                            patient.                           - High fiber diet.                           - Continue present medications.                           - Await pathology results.                           - No repeat colonoscopy due to age. Ladene Artist, MD 05/22/2021 11:55:24 AM This report has been signed electronically.

## 2021-05-22 NOTE — Progress Notes (Signed)
To PAcU, VSS. Report to [email or web address]

## 2021-05-26 ENCOUNTER — Telehealth: Payer: Self-pay | Admitting: *Deleted

## 2021-05-26 ENCOUNTER — Other Ambulatory Visit: Payer: Self-pay | Admitting: Family Medicine

## 2021-05-26 NOTE — Telephone Encounter (Signed)
  Follow up Call-  Call back number 05/22/2021  Post procedure Call Back phone  # [phone]  Permission to leave phone message Yes  Some recent data might be hidden     Patient questions:  Do you have a fever, pain , or abdominal swelling? No. Pain Score  0 *  Have you tolerated food without any problems? Yes.    Have you been able to return to your normal activities? Yes.    Do you have any questions about your discharge instructions: Diet   No. Medications  No. Follow up visit  No.  Do you have questions or concerns about your Care? No.  Actions: * If pain score is 4 or above: No action needed, pain <4.  Have you developed a fever since your procedure? no  2.   Have you had an respiratory symptoms (SOB or cough) since your procedure? no  3.   Have you tested positive for COVID 19 since your procedure no  4.   Have you had any family members/close contacts diagnosed with the COVID 19 since your procedure?  no   If yes to any of these questions please route to Joylene John, RN and Joella Prince, RN

## 2021-05-28 NOTE — Telephone Encounter (Signed)
Pt LOV was 02/06/21 Last refill was done on 10/28/2020 for 30 with 5 refills Please advise

## 2021-06-02 ENCOUNTER — Encounter: Payer: Self-pay | Admitting: Gastroenterology

## 2021-06-16 ENCOUNTER — Telehealth: Payer: Self-pay | Admitting: Pharmacist

## 2021-06-16 NOTE — Chronic Care Management (AMB) (Signed)
    Chronic Care Management Pharmacy Assistant   Name: JARAMIAH BOSSARD  MRN: [id number] DOB: 1941/09/24  06/16/2021- Patient called to remind of appointment with [name], CPP on 06/17/2021 at 4pm.  Patient aware of appointment date, time, and type of appointment (either telephone or in person). Patient aware to have/bring all medications, supplements, blood pressure and/or blood sugar logs to visit.  SIG:  Pattricia Boss, [place] Pharmacist Assistant [phone]

## 2021-06-17 ENCOUNTER — Ambulatory Visit (INDEPENDENT_AMBULATORY_CARE_PROVIDER_SITE_OTHER): Payer: PPO | Admitting: Pharmacist

## 2021-06-17 DIAGNOSIS — E119 Type 2 diabetes mellitus without complications: Secondary | ICD-10-CM | POA: Diagnosis not present

## 2021-06-17 DIAGNOSIS — I10 Essential (primary) hypertension: Secondary | ICD-10-CM

## 2021-06-17 NOTE — Progress Notes (Signed)
Chronic Care Management Pharmacy Note  06/29/2021 Name:  Troy Howard MRN:  [id number] DOB:  1941-08-30  Summary: A1c not at goal < 7% Triglycerides improved but not at goal  Recommendations/Changes made from today's visit: -Scheduled CPE for November -Recommended stopping Januiva as patient continued to take  Plan: Follow up DM assessment in 3-4 weeks  Subjective: Troy Howard is an 80 y.o. year old male who is a primary patient of Troy Morale, MD.  The CCM team was consulted for assistance with disease management and care coordination needs.    Engaged with patient by telephone for follow up visit in response to provider referral for pharmacy case management and/or care coordination services.   Consent to Services:  The patient was given information about Chronic Care Management services, agreed to services, and gave verbal consent prior to initiation of services.  Please see initial visit note for detailed documentation.   Patient Care Team: Troy Morale, MD as PCP - General (Family Medicine) Viona Gilmore, [facility] as Pharmacist (Pharmacist)  Recent office visits: 01/30/21 Ofilia Neas, LPN: Patient presented for medicare annual wellness visit. Referral placed for gastroenterology.   10/28/20 [name], MD: Patient presented for annual exam. A1c increased to 8.7%. Prescribed Januvia 100 mg daily.  Recent consult visits: 05/22/21 Gastro: Patient presented for colonoscopy.  05/08/21 Gastro: Patient presented for colonoscopy prep visit.  04/09/21 Sullivan Lone, MD (oncology): Patient presented for monoclonal paraproteinemia follow up.   03/04/21 Sullivan Lone, MD (oncology): Patient presented for monoclonal paraproteinemia follow up.   09/03/20 Etter Sjogren (dermatology): Patient presented for rosacea follow up. Unable to access notes.  08/28/20 Suella Broad (PT): Unable to access notes.  07/18/20 Timoteo Ace (nephrology): Patient presented for CKD follow up.  Unable to access notes.   Hospital visits: 10/13/20 Patient presented to urgent care for upper respiratory infection.   Objective:  Lab Results  Component Value Date   CREATININE 1.89 (H) 02/25/2021   BUN 30 (H) 02/25/2021   GFR 47.18 (L) 10/24/2019   GFRNONAA 36 (L) 02/25/2021   GFRAA 46 (L) 10/28/2020   NA 137 02/25/2021   K 4.4 02/25/2021   CALCIUM 8.8 (L) 02/25/2021   CO2 23 02/25/2021    Lab Results  Component Value Date/Time   HGBA1C 7.2 (H) 05/20/2021 07:01 AM   HGBA1C 7.2 (A) 02/11/2021 08:56 AM   HGBA1C 8.7 (H) 10/28/2020 01:37 PM   GFR 47.18 (L) 10/24/2019 02:19 PM   GFR 53.23 (L) 10/18/2018 07:42 AM   MICROALBUR 7.3 (H) 08/14/2014 10:11 AM   MICROALBUR 1.1 03/22/2012 09:32 AM    Last diabetic Eye exam:  Lab Results  Component Value Date/Time   HMDIABEYEEXA No Retinopathy 09/16/2016 09:15 AM    Last diabetic Foot exam: No results found for: HMDIABFOOTEX   Lab Results  Component Value Date   CHOL 118 05/20/2021   HDL 17.00 (L) 05/20/2021   [place]  10/28/2020     Comment:     . LDL cholesterol not calculated. Triglyceride levels greater than 400 mg/dL invalidate calculated LDL results. . Reference range: <100 . Desirable range <100 mg/dL for primary prevention;   <70 mg/dL for patients with CHD or diabetic patients  with > or = 2 CHD risk factors. Marland Kitchen LDL-C is now calculated using the Martin-Hopkins  calculation, which is a validated novel method providing  better accuracy than the Friedewald equation in the  estimation of LDL-C.  Cresenciano Genre et al. Annamaria Helling. 5625;638(93): 2061-2068  (http://education.[employer].com/faq/FAQ164)  LDLDIRECT 55.0 05/20/2021   TRIG 261.0 (H) 05/20/2021   CHOLHDL 7 05/20/2021    Hepatic Function Latest Ref Rng & Units 05/20/2021 02/25/2021 11/01/2020  Total Protein 6.0 - 8.3 g/dL 7.8 8.6(H) 8.1  Albumin 3.5 - 5.2 g/dL 3.8 3.4(L) 3.5  AST 0 - 37 U/L 11 13(L) 13(L)  ALT 0 - 53 U/L _0 Alk Phosphatase 39 - 117  U/L 74 76 87  Total Bilirubin 0.2 - 1.2 mg/dL 0.7 0.6 0.5  Bilirubin, Direct 0.0 - 0.3 mg/dL 0.1 - -    Lab Results  Component Value Date/Time   TSH 0.52 10/28/2020 01:37 PM   TSH 1.69 10/24/2019 02:19 PM   FREET4 1.4 10/28/2020 01:37 PM   FREET4 0.99 10/24/2019 02:19 PM    CBC Latest Ref Rng & Units 03/27/2021 02/25/2021 10/28/2020  WBC 4.0 - 10.5 K/uL 4.7 3.8(L) 6.4  Hemoglobin 13.0 - 17.0 g/dL 13.3 13.4 13.7  Hematocrit 39.0 - 52.0 % 40.0 39.6 40.4  Platelets 150 - 400 K/uL 209 189 230    Lab Results  Component Value Date/Time   VD25OH 99.17 02/25/2021 08:08 AM   VD25OH 23.50 (L) 02/22/2018 04:41 PM   VD25OH 67 10/22/2009 08:53 PM    Clinical ASCVD: No  The ASCVD Risk score (Goff DC Jr., et al., 2013) failed to calculate for the following reasons:   The valid HDL cholesterol range is 20 to 100 mg/dL   The valid total cholesterol range is 130 to 320 mg/dL    Depression screen [facility] 2/9 01/30/2021 10/28/2020 10/24/2019  Decreased Interest 0 0 0  Down, Depressed, Hopeless 0 0 0  PHQ - 2 Score 0 0 0      Social History   Tobacco Use  Smoking Status Former   Packs/day: 0.25   Years: 5.00   Pack years: 1.25   Types: Cigarettes   Quit date: 38   Years since quitting: 55.6  Smokeless Tobacco Never   BP Readings from Last 3 Encounters:  05/22/21 (!) 129/55  04/09/21 130/64  03/27/21 (!) 142/68   Pulse Readings from Last 3 Encounters:  05/22/21 (!) 53  04/09/21 81  03/27/21 64   Wt Readings from Last 3 Encounters:  05/22/21 215 lb (97.5 kg)  05/08/21 215 lb (97.5 kg)  04/09/21 214 lb (97.1 kg)    Assessment/Interventions: Review of patient past medical history, allergies, medications, health status, including review of consultants reports, laboratory and other test data, was performed as part of comprehensive evaluation and provision of chronic care management services.   SDOH:  (Social Determinants of Health) assessments and interventions performed:  Yes   CCM Care Plan  No Known Allergies  Medications Reviewed Today     Reviewed by Laretta Alstrom, CRNA (Certified Registered Nurse Anesthetist) on 05/22/21 at 1128  Med List Status: <None>   Medication Order Taking? Sig Documenting Provider Last Dose Status Informant  0.9 %  sodium chloride infusion [id number]   Ladene Artist, MD  Active   aspirin-acetaminophen-caffeine [facility] MIGRAINE) [phone] MG tablet [id number] Yes Take 1 tablet by mouth every 8 (eight) hours as needed for migraine. [provider] 05/21/2021 Active Self  atorvastatin (LIPITOR) 10 MG tablet [id number] Yes Take 1 tablet (10 mg total) by mouth daily. Troy Morale, MD 05/21/2021 Active   Blood Glucose Monitoring Suppl (ONE TOUCH ULTRA 2) w/Device KIT [id number] Yes Use to check blood sugars once daily. Troy Morale, MD 05/21/2021 Active   canagliflozin [facility]) 300  MG TABS tablet [id number] Yes Take 1 tablet (300 mg total) by mouth daily before breakfast. Troy Morale, MD 05/21/2021 Active   CHELATED ZINC PO [id number] Yes Take 1 capsule by mouth daily. [provider] 05/21/2021 Active Self  Cholecalciferol (VITAMIN D3) 2000 units capsule [id number] Yes Take 2,000 Units by mouth daily.  [provider] 05/21/2021 Active Self  Cyanocobalamin (VITAMIN B 12 PO) [id number] Yes Take 2,500 mcg by mouth daily.  [provider] 05/21/2021 Active Self  esomeprazole (NEXIUM) 40 MG capsule [id number] Yes TAKE 1 CAPSULE BY MOUTH EVERY DAY Troy Morale, MD 3/87/5643 Active   FOLIC ACID PO [id number] Yes Take 800 mcg by mouth daily.  [provider] 05/21/2021 Active Self  glipiZIDE (GLUCOTROL) 10 MG tablet [id number] Yes Take 1 tablet (10 mg total) by mouth 2 (two) times daily before a meal. Troy Morale, MD 05/21/2021 Active   glucose blood test strip [id number] Yes USE ONCE A DAY TO TEST BLOOD SUGAR Troy Morale, MD 05/21/2021 Active   GNP GARLIC EXTRACT PO [id number] Yes Take 800 mg by  mouth daily. [provider] 05/21/2021 Active Self  JANUVIA 100 MG tablet [id number] Yes Take 100 mg by mouth daily. [provider] 05/21/2021 Active   Levomefolate Glucosamine (METHYLFOLATE PO) [id number] Yes Take 1 tablet by mouth daily. [provider] 05/21/2021 Active Self  losartan (COZAAR) 100 MG tablet [id number] Yes Take 1 tablet (100 mg total) by mouth daily. Troy Morale, MD 05/21/2021 Active   Omega-3 Fatty Acids (FISH OIL) 1000 MG CAPS [id number] Yes Take by mouth daily.  [provider] 05/21/2021 Active Self  OneTouch Delica Lancets 00X MISC [id number] Yes USE ONCE A DAY WHEN CHECKING BLOOD SUGAR Troy Morale, MD 05/21/2021 Active   pyridOXINE (VITAMIN B-6) 100 MG tablet [id number] Yes Take 100 mg by mouth daily. [provider] 05/21/2021 Active Self  SYNTHROID 137 MCG tablet [id number] Yes Take 1 tablet (137 mcg total) by mouth daily before breakfast. Troy Morale, MD 05/21/2021 Active   tamsulosin (FLOMAX) 0.4 MG CAPS capsule [id number] No Take 2 capsules (0.8 mg total) by mouth daily.  Patient not taking: Reported on 05/22/2021   Troy Morale, MD Not Taking Active            Med Note Ernestina Columbia Mar 27, 2021  7:24 AM) No longer taking  temazepam (RESTORIL) 30 MG capsule [id number] Yes Take 1 capsule (30 mg total) by mouth at bedtime as needed for sleep. 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   HYPERTENSION, BENIGN 11/23/2007   PROSTATE SPECIFIC ANTIGEN, ELEVATED 11/23/2007   MIGRAINE HEADACHE 06/13/2007    Immunization History  Administered Date(s) Administered    Influenza Whole 09/05/2010   Influenza, High Dose Seasonal PF 08/20/2015, 09/08/2017, 08/19/2018, 08/28/2019, 08/28/2019   Influenza,inj,Quad PF,6+ Mos 08/14/2014   Influenza-Unspecified 09/16/2016, 09/08/2017, 09/23/2020   Moderna Sars-Covid-2 Vaccination 01/05/2020, 01/31/2020, 09/24/2020   Pneumococcal Conjugate-13 08/14/2014   Pneumococcal Polysaccharide-23 11/23/2007   Zoster Recombinat (Shingrix) 10/17/2018, 01/28/2019   Zoster, Live 11/19/2009    Conditions  to be addressed/monitored:  Hypertension, Hyperlipidemia, Diabetes, GERD, Chronic Kidney Disease, Hypothyroidism and migraine, nocturia  Care Plan : [place]  Updates made by Viona Gilmore, [place] since 06/29/2021 12:00 AM     Problem: Problem: Hypertension, Hyperlipidemia, Diabetes, GERD, Chronic Kidney Disease, Hypothyroidism and migraine, nocturia      Long-Range Goal: Patient-Specific Goal   Start Date: 02/06/2021  Expected End Date: 02/06/2022  Recent Progress: On track  Priority: High  Note:   Current Barriers:  Unable to achieve control of diabetes and cholesterol   Pharmacist Clinical Goal(s):  Patient will achieve adherence to monitoring guidelines and medication adherence to achieve therapeutic efficacy achieve control of diabetes as evidenced by A1c  through collaboration with PharmD and provider.   Interventions: 1:1 collaboration with Troy Morale, MD regarding development and update of comprehensive plan of care as evidenced by provider attestation and co-signature Inter-disciplinary care team collaboration (see longitudinal plan of care) Comprehensive medication review performed; medication list updated in electronic medical record  Hypertension (BP goal <140/90) -Uncontrolled -Current treatment: losartan 143m, 1 tablet daily -Medications previously tried: Benicar  -Current home readings: does not check at home -Current dietary habits: he has lost 25 lbs over last year and a half  and has been able to cut down on fattty foods and sweets -Current exercise habits: patients states he walks 3 miles/ day -Denies hypotensive/hypertensive symptoms -Educated on Importance of home blood pressure monitoring; -Counseled to monitor BP at home weekly, document, and provide log at future appointments -Recommended to continue current medication  Hyperlipidemia: (LDL goal < 100) -Uncontrolled -Current treatment: Omega-3 Fatty-acids 10046m 2 capsules daily Atorvastatin 10 mg daily  -Medications previously tried: atorvastatin, fenofibrate -Current dietary patterns: he has cut back on sweets and smaller portion sizes in general; he is eating a lot for fruit and vegetables -Current exercise habits: patient walks 3 miles a day and is golfing on Mon and Wednesdays -Educated on Cholesterol goals;  Importance of limiting foods high in cholesterol; Exercise goal of 150 minutes per week; -Recommended repeat cholesterol check and may need to consider medications to avoid pancreatitis risk  Diabetes (A1c goal <7%) -Uncontrolled -Current medications: Invokana 300 mg 1 tablet daily glipizide 1072m1 tablet twice daily before meals -Medications previously tried: metformin (kidney function), Januvia (kidney function) -Current home glucose readings fasting glucose:71-156; usually in the 130-140s post prandial glucose: n/a -Denies hypoglycemic/hyperglycemic symptoms -Current meal patterns: he has cut back on sweets and smaller portion sizes in general; he is eating a lot for fruit and vegetables; not eating fast food -Current exercise: plays golf 3 times a week 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  Hypothyroidism (Goal: TSH 0.35-4.5) -Controlled -Current treatment  levothyroxine 137m42m1 tablet once daily before breakfast  -Medications previously tried: none  -Recommended to continue current medication  GERD (Goal: minimize symptoms of acid reflux/heartburn) -Controlled -Current treatment  esomeprazole 40mg33mcapsule daily (around noon time) -Medications previously tried: none  -Recommended to continue current medication  Nocturia (Goal: minimize symptoms) -Controlled -Current treatment  tamsulosin 0.4mg, 70mapsules daily -Medications previously tried: none -Recommended to continue current medication  Vitamin D deficiency (Goal: vitamin D 30-100) -Uncontrolled -Current  treatment  Vitamin D 2000 units daily -Medications previously tried: none  -Recommended to continue current medication Recommended repeat vitamin D level  Vitamin B12 deficiency (Goal: [phone]) -Controlled -Current treatment  Vitamin B12 2500 mcg daily -Medications previously tried: none -Recommended to continue current medication  Insomnia (Goal: improve quality and quantity of sleep) -Controlled -Current treatment  Temazepam 30 mg 1 capsule at bedtime as needed -Medications previously tried: none -Counseled on practicing good sleep hygiene by setting a sleep schedule and maintaining it, avoid excessive napping, following a nightly routine, avoiding screen time for 30-60 minutes before going to bed, and making the bedroom a cool, quiet and dark space Patient is not taking every night  Health Maintenance -Vaccine gaps: tetanus -Current therapy:  Folic acid daily Vitamin B6 100 mg daily Zinc daily Garlic daily Excedrin Migraine- takes as needed for occasional migraine -Educated on Cost vs benefit of each product must be carefully weighed by individual consumer -Patient is satisfied with current therapy and denies issues -Recommended to continue current  medication  Patient Goals/Self-Care Activities Over the next 180 days, patient will:  - take medications as prescribed check glucose daily, document, and provide at future appointments check blood pressure weekly, document, and provide at future appointments  Follow Up Plan: Telephone follow up appointment with care management team member scheduled for: 6 months       Medication Assistance: None required.  Patient affirms current coverage meets needs.  Compliance/Adherence/Medication fill history: Care Gaps: Eye exam, foot exam, tetanus, COVID booster, Hep C screening  Star-Rating Drugs: Glipizide 32m - [facility] Atorvastatin 152m- last filled on [facility]/18/21 [facility] Invokana 30065m last filled on [facility]/21 [facility] Losartan 100m78mlast filled on [facility]/02/[facility] Sitagliptin 100mg29mast filled [facility]/21 [facility]  [facility]  [id number] [address]: [phone] pill box? Yes Pt endorses 100% compliance  We discussed: Current pharmacy is preferred with insurance plan and patient is satisfied with pharmacy services Patient decided to: Continue current medication management strategy  Care Plan and Follow Up Patient Decision:  Patient agrees to Care Plan and Follow-up.  Plan: Telephone follow up appointment with care management team member scheduled for:  5 months  MadeliJeni SallesmD BCACP [phone]

## 2021-06-20 ENCOUNTER — Telehealth: Payer: PPO

## 2021-06-29 NOTE — Patient Instructions (Signed)
Hi Troy Howard,  It was great to catch up again! Below is a summary of some of the topics we discussed.   Please reach out to me if you have any questions or need anything before our follow up!  Best, Maddie  [name], PharmD, [place] [facility]   Visit Information   Goals Addressed   None    Patient Care Plan: CCM Pharmacy Care Plan     Problem Identified: Problem: Hypertension, Hyperlipidemia, Diabetes, GERD, Chronic Kidney Disease, Hypothyroidism and migraine, nocturia      Long-Range Goal: Patient-Specific Goal   Start Date: 02/06/2021  Expected End Date: 02/06/2022  Recent Progress: On track  Priority: High  Note:   Current Barriers:  Unable to achieve control of diabetes and cholesterol   Pharmacist Clinical Goal(s):  Patient will achieve adherence to monitoring guidelines and medication adherence to achieve therapeutic efficacy achieve control of diabetes as evidenced by A1c  through collaboration with PharmD and provider.   Interventions: 1:1 collaboration with Laurey Morale, MD regarding development and update of comprehensive plan of care as evidenced by provider attestation and co-signature Inter-disciplinary care team collaboration (see longitudinal plan of care) Comprehensive medication review performed; medication list updated in electronic medical record  Hypertension (BP goal <140/90) -Uncontrolled -Current treatment: losartan '100mg'$ , 1 tablet daily -Medications previously tried: Benicar  -Current home readings: does not check at home -Current dietary habits: he has lost 25 lbs over last year and a half and has been able to cut down on fattty foods and sweets -Current exercise habits: patients states he walks 3 miles/ day -Denies hypotensive/hypertensive symptoms -Educated on Importance of home blood pressure monitoring; -Counseled to monitor BP at home weekly, document, and provide log at  future appointments -Recommended to continue current medication  Hyperlipidemia: (LDL goal < 100) -Uncontrolled -Current treatment: Omega-3 Fatty-acids '1000mg'$ , 2 capsules daily Atorvastatin 10 mg daily  -Medications previously tried: atorvastatin, fenofibrate -Current dietary patterns: he has cut back on sweets and smaller portion sizes in general; he is eating a lot for fruit and vegetables -Current exercise habits: patient walks 3 miles a day and is golfing on Mon and Wednesdays -Educated on Cholesterol goals;  Importance of limiting foods high in cholesterol; Exercise goal of 150 minutes per week; -Recommended repeat cholesterol check and may need to consider medications to avoid pancreatitis risk  Diabetes (A1c goal <7%) -Uncontrolled -Current medications: Invokana 300 mg 1 tablet daily glipizide '10mg'$ , 1 tablet twice daily before meals -Medications previously tried: metformin (kidney function), Januvia (kidney function) -Current home glucose readings fasting glucose:71-156; usually in the 130-140s post prandial glucose: n/a -Denies hypoglycemic/hyperglycemic symptoms -Current meal patterns: he has cut back on sweets and smaller portion sizes in general; he is eating a lot for fruit and vegetables; not eating fast food -Current exercise: plays golf 3 times a week 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  Hypothyroidism (Goal: TSH 0.35-4.5) -Controlled -Current treatment  levothyroxine 189mg, 1 tablet once daily before breakfast  -Medications previously tried: none  -Recommended to continue current medication  GERD (Goal: minimize symptoms of acid  reflux/heartburn) -Controlled -Current treatment  esomeprazole '40mg'$ , 1 capsule daily (around noon time) -Medications  previously tried: none  -Recommended to continue current medication  Nocturia (Goal: minimize symptoms) -Controlled -Current treatment  tamsulosin 0.'4mg'$ , 2 capsules daily -Medications previously tried: none -Recommended to continue current medication  Vitamin D deficiency (Goal: vitamin D 30-100) -Uncontrolled -Current treatment  Vitamin D 2000 units daily -Medications previously tried: none  -Recommended to continue current medication Recommended repeat vitamin D level  Vitamin B12 deficiency (Goal: [phone]) -Controlled -Current treatment  Vitamin B12 2500 mcg daily -Medications previously tried: none -Recommended to continue current medication  Insomnia (Goal: improve quality and quantity of sleep) -Controlled -Current treatment  Temazepam 30 mg 1 capsule at bedtime as needed -Medications previously tried: none -Counseled on practicing good sleep hygiene by setting a sleep schedule and maintaining it, avoid excessive napping, following a nightly routine, avoiding screen time for 30-60 minutes before going to bed, and making the bedroom a cool, quiet and dark space Patient is not taking every night  Health Maintenance -Vaccine gaps: tetanus -Current therapy:  Folic acid daily Vitamin B6 100 mg daily Zinc daily Garlic daily Excedrin Migraine- takes as needed for occasional migraine -Educated on Cost vs benefit of each product must be carefully weighed by individual consumer -Patient is satisfied with current therapy and denies issues -Recommended to continue current medication  Patient Goals/Self-Care Activities Over the next 180 days, patient will:  - take medications as prescribed check glucose daily, document, and provide at future appointments check blood pressure weekly, document, and provide at future appointments  Follow Up Plan: Telephone  follow up appointment with care management team member scheduled for: 6 months       Patient verbalizes understanding of instructions provided today and agrees to view in [place].  The pharmacy team will reach out to the patient again over the next 30 days.   Viona Gilmore, [facility]

## 2021-07-14 ENCOUNTER — Telehealth: Payer: Self-pay | Admitting: Pharmacist

## 2021-07-14 NOTE — Chronic Care Management (AMB) (Signed)
Chronic Care Management Pharmacy Assistant   Name: Troy Howard  MRN: [id number] DOB: [birth year]  Reason for Encounter: Disease State/ Diabetes Assessment Call.   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   Recent consult visits:  None.  Hospital visits:  None in previous 6 months  Medications: Outpatient Encounter Medications as of 07/14/2021  Medication Sig Note   aspirin-acetaminophen-caffeine (EXCEDRIN MIGRAINE) 250-250-65 MG tablet Take 1 tablet by mouth every 8 (eight) hours as needed for migraine.    atorvastatin (LIPITOR) 10 MG tablet Take 1 tablet (10 mg total) by mouth daily.    Blood Glucose Monitoring Suppl (ONE TOUCH ULTRA 2) w/Device KIT Use to check blood sugars once daily.    canagliflozin (INVOKANA) 300 MG TABS tablet Take 1 tablet (300 mg total) by mouth daily before breakfast.    CHELATED ZINC PO Take 1 capsule by mouth daily.    Cholecalciferol (VITAMIN D3) 2000 units capsule Take 2,000 Units by mouth daily.     Cyanocobalamin (VITAMIN B 12 PO) Take 2,500 mcg by mouth daily.     esomeprazole (NEXIUM) 40 MG capsule TAKE 1 CAPSULE BY MOUTH EVERY DAY    FOLIC ACID PO Take 800 mcg by mouth daily.     glipiZIDE (GLUCOTROL) 10 MG tablet Take 1 tablet (10 mg total) by mouth 2 (two) times daily before a meal.    glucose blood test strip USE ONCE A DAY TO TEST BLOOD SUGAR    GNP GARLIC EXTRACT PO Take 800 mg by mouth daily.    Levomefolate Glucosamine (METHYLFOLATE PO) Take 1 tablet by mouth daily.    losartan (COZAAR) 100 MG tablet Take 1 tablet (100 mg total) by mouth daily.    Omega-3 Fatty Acids (FISH OIL) 1000 MG CAPS Take by mouth daily.     OneTouch Delica Lancets 30G MISC USE ONCE A DAY WHEN CHECKING BLOOD SUGAR    pyridOXINE (VITAMIN B-6) 100 MG tablet Take 100 mg by mouth daily.    SYNTHROID 137 MCG tablet Take 1 tablet (137 mcg total) by mouth daily before breakfast.    tamsulosin (FLOMAX) 0.4 MG CAPS capsule Take  2 capsules (0.8 mg total) by mouth daily. (Patient not taking: Reported on 05/22/2021) 03/27/2021: No longer taking   temazepam (RESTORIL) 30 MG capsule TAKE 1 CAPSULE BY MOUTH AT BEDTIME AS NEEDED FOR SLEEP.    Facility-Administered Encounter Medications as of 07/14/2021  Medication   0.9 %  sodium chloride infusion   Fill History: ATORVASTATIN 10 MG TABLET 05/18/2021 90   INVOKANA 300 MG TABLET 06/18/2021 30   ESOMEPRAZOLE MAG DR 40 MG CAP 05/01/2021 90   ONETOUCH VERIO TEST STRIP 05/19/2021 90   SYNTHROID 137 MCG TABLET 06/18/2021 90   losartan (COZAAR) tablet 06/28/2021 90   JANUVIA 100 MG TABLET 05/23/2021 30   TEMAZEPAM 30 MG CAPSULE 06/29/2021 30   GLIPIZIDE 10 MG TABLET 04/28/2021 90   Recent Relevant Labs: Lab Results  Component Value Date/Time   HGBA1C 7.2 (H) 05/20/2021 07:01 AM   HGBA1C 7.2 (A) 02/11/2021 08:56 AM   HGBA1C 8.7 (H) 10/28/2020 01:37 PM   MICROALBUR 7.3 (H) 08/14/2014 10:11 AM   MICROALBUR 1.1 03/22/2012 09:32 AM    Kidney Function Lab Results  Component Value Date/Time   CREATININE 1.89 (H) 02/25/2021 08:08 AM   CREATININE 1.78 (H) 11/01/2020 09:01 AM   CREATININE 1.63 (H) 10/28/2020 01:37 PM   GFR 47.18 (L) 10/24/2019 02:19 PM   GFRNONAA  36 (L) 02/25/2021 08:08 AM   GFRNONAA 40 (L) 10/28/2020 01:37 PM   GFRAA 46 (L) 10/28/2020 01:37 PM    Current antihyperglycemic regimen:  Canagliflozin 300mg  - take 1 tablet by mouth daily before breakfast Glipizide 10mg  - take 1 tablet by mouth twice daily.  What recent interventions/DTPs have been made to improve glycemic control:  Januvia was stopped.  Have there been any recent hospitalizations or ED visits since last visit with CPP? No Patient hypoglycemic symptoms, including  Patient hyperglycemic symptoms, including  How often are you checking your blood sugar?  What are your blood sugars ranging?  Fasting:  Before meals:  After meals:  Bedtime:  During the week, how often does your blood  glucose drop below 70?  Are you checking your feet daily/regularly?   Adherence Review: Is the patient currently on a STATIN medication? Yes Is the patient currently on ACE/ARB medication? Yes Does the patient have >5 day gap between last estimated fill dates? No  Notes: Spoke with patient who was annoyed by me calling and stated he is "tired of having to answer the same questions" and he is "doing fine". Patient did confirm stopping the Januvia. Patient refused to any any other questions for me.  Care Gaps:  AWV - completed on 01/30/21 Hepatitis C screening - never done Tetanus/TDAP - never done Ophthalmology exam - overdue since 05/18/19 Foot exam - overdue since 10/23/20 Covid - 19 booster 4  - overdue since 12/25/20 Flu vaccine - due  Star Rating Drugs:  Atorvastatin 10mg  - [facility] Glipizide 10mg  - last filled [facility]/21 [facility] Canagliflozin 300mg  - last filled on [facility]18/21 [facility] Losartan 100mg  - last filled on 06/28/21 90DS at at [place] [phone]

## 2021-08-12 ENCOUNTER — Inpatient Hospital Stay: Payer: PPO | Admitting: Hematology

## 2021-08-12 ENCOUNTER — Other Ambulatory Visit: Payer: Self-pay

## 2021-08-12 ENCOUNTER — Other Ambulatory Visit: Payer: Self-pay | Admitting: *Deleted

## 2021-08-12 ENCOUNTER — Inpatient Hospital Stay: Payer: PPO | Attending: Hematology

## 2021-08-12 VITALS — BP 148/70 | HR 63 | Temp 97.8°F | Resp 16 | Ht 71.0 in | Wt 209.2 lb

## 2021-08-12 DIAGNOSIS — Z87891 Personal history of nicotine dependence: Secondary | ICD-10-CM | POA: Diagnosis not present

## 2021-08-12 DIAGNOSIS — C9000 Multiple myeloma not having achieved remission: Secondary | ICD-10-CM | POA: Insufficient documentation

## 2021-08-12 DIAGNOSIS — N183 Chronic kidney disease, stage 3 unspecified: Secondary | ICD-10-CM | POA: Insufficient documentation

## 2021-08-12 DIAGNOSIS — E538 Deficiency of other specified B group vitamins: Secondary | ICD-10-CM | POA: Insufficient documentation

## 2021-08-12 DIAGNOSIS — E785 Hyperlipidemia, unspecified: Secondary | ICD-10-CM | POA: Diagnosis not present

## 2021-08-12 DIAGNOSIS — I10 Essential (primary) hypertension: Secondary | ICD-10-CM | POA: Insufficient documentation

## 2021-08-12 DIAGNOSIS — D472 Monoclonal gammopathy: Secondary | ICD-10-CM

## 2021-08-12 DIAGNOSIS — E039 Hypothyroidism, unspecified: Secondary | ICD-10-CM | POA: Diagnosis not present

## 2021-08-12 DIAGNOSIS — E119 Type 2 diabetes mellitus without complications: Secondary | ICD-10-CM | POA: Insufficient documentation

## 2021-08-12 DIAGNOSIS — Z79899 Other long term (current) drug therapy: Secondary | ICD-10-CM | POA: Insufficient documentation

## 2021-08-12 LAB — CBC WITH DIFFERENTIAL (CANCER CENTER ONLY)
Abs Immature Granulocytes: 0.02 10*3/uL (ref 0.00–0.07)
Basophils Absolute: 0 10*3/uL (ref 0.0–0.1)
Basophils Relative: 1 %
Eosinophils Absolute: 0.1 10*3/uL (ref 0.0–0.5)
Eosinophils Relative: 1 %
HCT: 36.6 % — ABNORMAL LOW (ref 39.0–52.0)
Hemoglobin: 12.7 g/dL — ABNORMAL LOW (ref 13.0–17.0)
Immature Granulocytes: 1 %
Lymphocytes Relative: 39 %
Lymphs Abs: 1.7 10*3/uL (ref 0.7–4.0)
MCH: 31.4 pg (ref 26.0–34.0)
MCHC: 34.7 g/dL (ref 30.0–36.0)
MCV: 90.6 fL (ref 80.0–100.0)
Monocytes Absolute: 0.5 10*3/uL (ref 0.1–1.0)
Monocytes Relative: 13 %
Neutro Abs: 2 10*3/uL (ref 1.7–7.7)
Neutrophils Relative %: 45 %
Platelet Count: 196 10*3/uL (ref 150–400)
RBC: 4.04 MIL/uL — ABNORMAL LOW (ref 4.22–5.81)
RDW: 13.9 % (ref 11.5–15.5)
WBC Count: 4.3 10*3/uL (ref 4.0–10.5)
nRBC: 0 % (ref 0.0–0.2)

## 2021-08-12 LAB — CMP (CANCER CENTER ONLY)
ALT: 16 U/L (ref 0–44)
AST: 13 U/L — ABNORMAL LOW (ref 15–41)
Albumin: 3.3 g/dL — ABNORMAL LOW (ref 3.5–5.0)
Alkaline Phosphatase: 88 U/L (ref 38–126)
Anion gap: 9 (ref 5–15)
BUN: 31 mg/dL — ABNORMAL HIGH (ref 8–23)
CO2: 22 mmol/L (ref 22–32)
Calcium: 9.4 mg/dL (ref 8.9–10.3)
Chloride: 106 mmol/L (ref 98–111)
Creatinine: 1.67 mg/dL — ABNORMAL HIGH (ref 0.61–1.24)
GFR, Estimated: 42 mL/min — ABNORMAL LOW (ref >60)
Glucose, Bld: 117 mg/dL — ABNORMAL HIGH (ref 70–99)
Potassium: 4.4 mmol/L (ref 3.5–5.1)
Sodium: 137 mmol/L (ref 135–145)
Total Bilirubin: 1 mg/dL (ref 0.3–1.2)
Total Protein: 8.6 g/dL — ABNORMAL HIGH (ref 6.5–8.1)

## 2021-08-13 LAB — KAPPA/LAMBDA LIGHT CHAINS
Kappa free light chain: 19 mg/L (ref 3.3–19.4)
Kappa, lambda light chain ratio: 0.35 (ref 0.26–1.65)
Lambda free light chains: 53.7 mg/L — ABNORMAL HIGH (ref 5.7–26.3)

## 2021-08-14 DIAGNOSIS — D472 Monoclonal gammopathy: Secondary | ICD-10-CM | POA: Diagnosis not present

## 2021-08-14 DIAGNOSIS — E785 Hyperlipidemia, unspecified: Secondary | ICD-10-CM | POA: Diagnosis not present

## 2021-08-14 DIAGNOSIS — N1831 Chronic kidney disease, stage 3a: Secondary | ICD-10-CM | POA: Diagnosis not present

## 2021-08-14 DIAGNOSIS — E1122 Type 2 diabetes mellitus with diabetic chronic kidney disease: Secondary | ICD-10-CM | POA: Diagnosis not present

## 2021-08-14 DIAGNOSIS — I129 Hypertensive chronic kidney disease with stage 1 through stage 4 chronic kidney disease, or unspecified chronic kidney disease: Secondary | ICD-10-CM | POA: Diagnosis not present

## 2021-08-16 LAB — MULTIPLE MYELOMA PANEL, SERUM
Albumin SerPl Elph-Mcnc: 3.5 g/dL (ref 2.9–4.4)
Albumin/Glob SerPl: 0.9 (ref 0.7–1.7)
Alpha 1: 0.2 g/dL (ref 0.0–0.4)
Alpha2 Glob SerPl Elph-Mcnc: 0.9 g/dL (ref 0.4–1.0)
B-Globulin SerPl Elph-Mcnc: 0.8 g/dL (ref 0.7–1.3)
Gamma Glob SerPl Elph-Mcnc: 2.3 g/dL — ABNORMAL HIGH (ref 0.4–1.8)
Globulin, Total: 4.2 g/dL — ABNORMAL HIGH (ref 2.2–3.9)
IgA: 54 mg/dL — ABNORMAL LOW (ref 61–437)
IgG (Immunoglobin G), Serum: 2663 mg/dL — ABNORMAL HIGH (ref 603–1613)
IgM (Immunoglobulin M), Srm: 25 mg/dL (ref 15–143)
M Protein SerPl Elph-Mcnc: 2.1 g/dL — ABNORMAL HIGH
Total Protein ELP: 7.7 g/dL (ref 6.0–8.5)

## 2021-08-18 NOTE — Progress Notes (Signed)
HEMATOLOGY/ONCOLOGY CLINIC NOTE  Date of Service: .08/12/2021   Patient Care Team: Laurey Morale, MD as PCP - General (Family Medicine) Viona Gilmore, [facility] as Pharmacist (Pharmacist)  CHIEF COMPLAINTS/PURPOSE OF CONSULTATION:  Follow-up for smoldering myeloma  HISTORY OF PRESENTING ILLNESS:   Troy Howard 80 y.o. male is a here because of positive M-spike. The patient was referred by nephrologist Dr. Hollie Salk. The patient presents to the clinic today accompanied by his wife.  He is being seen by nephrologist who pursued further work up to why his Creatinine has increased. Upon workup his M-protein was found to be 0.8g/dl.  He has had HTN and DM for many years that can affects his Kidney function and was thought to be the likely cause of his CKD.   Today his wife notes he has had muscle pain, muscle mass loss and fatigue.   He has HTN, DM, GERD, Hypothyroid, Hyperlipidemia. He was recently taken off statins, meloxicam, ASA and metformin. He takes Nexium for her GERD and has been on for many years.   On review of symptoms, pt notes purposeful weight loss, muscle loss and muscle pain. He also has leg pain from b/l knees to anterior shin.  Interval History:   Troy Howard returns today regarding follow-up of his smoldering myeloma. We are joined today by his wife, Troy Howard. The patient's last visit with [place] was on 04/09/2021.  The pt reports that he has been feeling well and has no acute new symptoms.  No new bone pains.  No new fatigue.  Continues to play his golf.  Notes his other medical issues have been stable.  Labs done on 08/12/2021 show stable CBC with no significant anemia, CMP shows chronic kidney disease with a stable creatinine of 1.67.  No hypercalcemia. Myeloma panel shows an M spike of 2.1 g/dL slightly up from 1.9 g/dL previously in May 2022.  Patient has had a colonoscopy on 05/22/2021 which showed inflammatory and hyperplastic polyps.  No evidence of  colon cancer.  On review of systems, pt denies overt fatigue, sudden change in energy levels, new bone pains, fevers, chills, and any other symptoms.  MEDICAL HISTORY:  Past Medical History:  Diagnosis Date   Arthritis    back   Cancer [facility])    prostate, sees Dr. Raynelle Bring    Chronic kidney disease 2017   stage 3 kidney disease   Diabetes mellitus    Erectile dysfunction    GERD (gastroesophageal reflux disease)    Hyperlipidemia    Hypertension    Migraines    Peptic ulcer    Thyroid disease     SURGICAL HISTORY: Past Surgical History:  Procedure Laterality Date   APPENDECTOMY  1950   COLONOSCOPY  11/14/2015   per Dr. Fuller Plan, adenomatous polyps, repeat in 5 yrs    HERNIA REPAIR     umbilical    MENISCUS REPAIR Left    PROSTATE SURGERY  2009   radical prostatectomy   SPINE SURGERY     L4-L5 fusion per Dr. Glenna Fellows    TONSILLECTOMY AND ADENOIDECTOMY  1954    SOCIAL HISTORY: Social History   Socioeconomic History   Marital status: Married    Spouse name: Not on file   Number of children: Not on file   Years of education: Not on file   Highest education level: Not on file  Occupational History   Not on file  Tobacco Use   Smoking status: Former  Packs/day: 0.25    Years: 5.00    Pack years: 1.25    Types: Cigarettes    Quit date: 29    Years since quitting: 55.7   Smokeless tobacco: Never  Vaping Use   Vaping Use: Never used  Substance and Sexual Activity   Alcohol use: Not Currently    Comment: rare one glass every 6 months   Drug use: No   Sexual activity: Yes  Other Topics Concern   Not on file  Social History Narrative   Not on file   Social Determinants of Health   Financial Resource Strain: Low Risk    Difficulty of Paying Living Expenses: Not hard at all  Food Insecurity: No Food Insecurity   Worried About Charity fundraiser in the Last Year: Never true   [place] in the Last Year: Never true  Transportation Needs: No  Transportation Needs   Lack of Transportation (Medical): No   Lack of Transportation (Non-Medical): No  Physical Activity: Inactive   Days of Exercise per Week: 0 days   Minutes of Exercise per Session: 0 min  Stress: No Stress Concern Present   Feeling of Stress : Not at all  Social Connections: Moderately Integrated   Frequency of Communication with Friends and Family: Twice a week   Frequency of Social Gatherings with Friends and Family: More than three times a week   Attends Religious Services: More than 4 times per year   Active Member of [employer] or Organizations: No   Attends Music therapist: Never   Marital Status: Married  Human resources officer Violence: Not At Risk   Fear of Current or Ex-Partner: No   Emotionally Abused: No   Physically Abused: No   Sexually Abused: No    FAMILY HISTORY: Family History  Problem Relation Age of Onset   Hypertension Other    Cancer Other    Colon cancer Neg Hx    Colon polyps Neg Hx    Esophageal cancer Neg Hx    Rectal cancer Neg Hx    Stomach cancer Neg Hx     ALLERGIES:  has No Known Allergies.  MEDICATIONS:  Current Outpatient Medications  Medication Sig Dispense Refill   aspirin-acetaminophen-caffeine (EXCEDRIN MIGRAINE) 250-250-65 MG tablet Take 1 tablet by mouth every 8 (eight) hours as needed for migraine.     atorvastatin (LIPITOR) 10 MG tablet Take 1 tablet (10 mg total) by mouth daily. 90 tablet 3   Blood Glucose Monitoring Suppl (ONE TOUCH ULTRA 2) w/Device KIT Use to check blood sugars once daily. 1 kit 0   canagliflozin (INVOKANA) 300 MG TABS tablet Take 1 tablet (300 mg total) by mouth daily before breakfast. 30 tablet 11   CHELATED ZINC PO Take 1 capsule by mouth daily.     Cholecalciferol (VITAMIN D3) 2000 units capsule Take 2,000 Units by mouth daily.      Cyanocobalamin (VITAMIN B 12 PO) Take 2,500 mcg by mouth daily.      esomeprazole (NEXIUM) 40 MG capsule TAKE 1 CAPSULE BY MOUTH EVERY DAY 90 capsule 3    FOLIC ACID PO Take 664 mcg by mouth daily.      glipiZIDE (GLUCOTROL) 10 MG tablet Take 1 tablet (10 mg total) by mouth 2 (two) times daily before a meal. 180 tablet 3   glucose blood test strip USE ONCE A DAY TO TEST BLOOD SUGAR 100 each 3   GNP GARLIC EXTRACT PO Take 403 mg by mouth  daily.     Levomefolate Glucosamine (METHYLFOLATE PO) Take 1 tablet by mouth daily.     losartan (COZAAR) 100 MG tablet Take 1 tablet (100 mg total) by mouth daily. 90 tablet 3   Omega-3 Fatty Acids (FISH OIL) 1000 MG CAPS Take by mouth daily.      OneTouch Delica Lancets 45Y MISC USE ONCE A DAY WHEN CHECKING BLOOD SUGAR 100 each 3   pyridOXINE (VITAMIN B-6) 100 MG tablet Take 100 mg by mouth daily.     SYNTHROID 137 MCG tablet Take 1 tablet (137 mcg total) by mouth daily before breakfast. 90 tablet 3   tamsulosin (FLOMAX) 0.4 MG CAPS capsule Take 2 capsules (0.8 mg total) by mouth daily. (Patient not taking: Reported on 05/22/2021) 180 capsule 3   temazepam (RESTORIL) 30 MG capsule TAKE 1 CAPSULE BY MOUTH AT BEDTIME AS NEEDED FOR SLEEP. 30 capsule 5   Current Facility-Administered Medications  Medication Dose Route Frequency Provider Last Rate Last Admin   0.9 %  sodium chloride infusion  500 mL Intravenous Once Ladene Artist, MD        REVIEW OF SYSTEMS:   .10 Point review of Systems was done is negative except as noted above.  PHYSICAL EXAMINATION:  ECOG PERFORMANCE STATUS: 1 - Symptomatic but completely ambulatory  Vitals:   08/12/21 0914  BP: (!) 148/70  Pulse: 63  Resp: 16  Temp: 97.8 F (36.6 C)  SpO2: 100%   Filed Weights   08/12/21 0914  Weight: 209 lb 3.2 oz (94.9 kg)   .Body mass index is 29.18 kg/m.  Marland Kitchen GENERAL:alert, in no acute distress and comfortable SKIN: no acute rashes, no significant lesions EYES: conjunctiva are pink and non-injected, sclera anicteric OROPHARYNX: MMM, no exudates, no oropharyngeal erythema or ulceration NECK: supple, no JVD LYMPH:  no palpable  lymphadenopathy in the cervical, axillary or inguinal regions LUNGS: clear to auscultation b/l with normal respiratory effort HEART: regular rate & rhythm ABDOMEN:  normoactive bowel sounds , non tender, not distended. Extremity: no pedal edema PSYCH: alert & oriented x 3 with fluent speech NEURO: no focal motor/sensory deficits    LABORATORY DATA:  I have reviewed the data as listed  . CBC Latest Ref Rng & Units 08/12/2021 03/27/2021 02/25/2021  WBC 4.0 - 10.5 K/uL 4.3 4.7 3.8(L)  Hemoglobin 13.0 - 17.0 g/dL 12.7(L) 13.3 13.4  Hematocrit 39.0 - 52.0 % 36.6(L) 40.0 39.6  Platelets 150 - 400 K/uL 196 209 189   . CBC    Component Value Date/Time   WBC 4.3 08/12/2021 0856   WBC 4.7 03/27/2021 0718   RBC 4.04 (L) 08/12/2021 0856   HGB 12.7 (L) 08/12/2021 0856   HCT 36.6 (L) 08/12/2021 0856   PLT 196 08/12/2021 0856   MCV 90.6 08/12/2021 0856   MCH 31.4 08/12/2021 0856   MCHC 34.7 08/12/2021 0856   RDW 13.9 08/12/2021 0856   LYMPHSABS 1.7 08/12/2021 0856   MONOABS 0.5 08/12/2021 0856   EOSABS 0.1 08/12/2021 0856   BASOSABS 0.0 08/12/2021 0856    . CMP Latest Ref Rng & Units 08/12/2021 05/20/2021 02/25/2021  Glucose 70 - 99 mg/dL 117(H) - 185(H)  BUN 8 - 23 mg/dL 31(H) - 30(H)  Creatinine 0.61 - 1.24 mg/dL 1.67(H) - 1.89(H)  Sodium 135 - 145 mmol/L 137 - 137  Potassium 3.5 - 5.1 mmol/L 4.4 - 4.4  Chloride 98 - 111 mmol/L 106 - 103  CO2 22 - 32 mmol/L 22 - 23  Calcium  8.9 - 10.3 mg/dL 9.4 - 8.8(L)  Total Protein 6.5 - 8.1 g/dL 8.6(H) 7.8 8.6(H)  Total Bilirubin 0.3 - 1.2 mg/dL 1.0 0.7 0.6  Alkaline Phos 38 - 126 U/L 88 74 76  AST 15 - 41 U/L 13(L) 11 13(L)  ALT 0 - 44 U/L _0 03/27/2021 BM Bx   DIAGNOSIS:   BONE MARROW, ASPIRATE, CLOT, CORE:  -  Hypercellular bone marrow with involvement by plasma cell neoplasm  -  See comment    COMMENT:   Plasma cells are increased on aspirate smears (8% by manual differential  count) and by CD138  immunohistochemistry on the core biopsy and clot  section (15%).  Plasma cells express lambda-restricted light chains by  in situ hybridization.  Together the findings are consistent with bone  marrow involvement by a lambda-restricted plasma cell neoplasm.  Correlation with clinical impressions, radiographic studies, other  laboratory data, and cytogenetic/FISH results is recommended.   03/27/2021 Molecular Pathology    03/27/2021 Cytogenetics    RADIOGRAPHIC STUDIES: I have personally reviewed the radiological images as listed and agreed with the findings in the report. No results found.   ASSESSMENT & PLAN:   Troy Howard is a 80 y.o. caucasian male with    1. IgG Lambda smoldering myeloma He has a M-spike of 0.8g/dl and normal SFLC and ratio. No anemia - nl Hgb No focal bone pains --- Skeletal survey - shows no  No hypercalcemia. 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  05/10/18 Metastatic Bone Survey revealed no lytic lesions on skeletal survey to suggest multiple myeloma.   PLAN:  -Patient has no new symptoms suggestive of myeloma progression. -Labs done on 9/20 2022 were discussed with him Labs done on 08/12/2021 show stable CBC with no significant anemia, CMP shows chronic kidney disease with a stable creatinine of 1.67.  No hypercalcemia. Myeloma panel shows an M spike of 2.1 g/dL slightly up from 1.9 g/dL previously in May 2022. -Discussed CRAB criteria: no hypercalcemia, no renal disorder related to myeloma, no anemia, and no bone lesions. -Will continue with watchful observation at this time. -Recommended pt eat healthy, sleep well, de-stress, and stay physically active. -Recommend pt f/u with PCP for Diabetes and HTN management.  -Continue 2000 IU Vitamin D daily. -Will see back in 4 months with labs 1 week prior.    2. CKD, Stage III - likely related to HTN =  DM2 -Managed by Dr. Hollie Salk  -mild non nephrotic proteinuria on 24h UPEP-Continue follow up with Dr Hollie Salk for CKD    3. Vitamin B12 deficient  Antibody testing done - no evidence of pernicious anemia. No evidence of pernicious anemia based on neg IF and parietal cell ab -He has been actively losing weight by eating healthy with no diet restrictions.  -I discussed the possible cause of this deficiency such as long term use of Nexium and Metformin. He is no longer on Metformin.  -continue B12 replacement per PCP  4.  Past Medical History:  Diagnosis Date   Arthritis    back   Cancer [facility])    prostate, sees Dr. Raynelle Bring    Chronic kidney disease 2017   stage 3 kidney disease   Diabetes mellitus    Erectile dysfunction    GERD (gastroesophageal reflux disease)    Hyperlipidemia    Hypertension  Migraines    Peptic ulcer    Thyroid disease     FOLLOW UP: Phone visit with Dr Irene Limbo in 4 months Plz schedule lab appointment 1 week prior to phone visit  . The total time spent in the appointment was 23 minutes and more than 50% was on counseling and direct patient cares.   All of the patient's questions were answered with apparent satisfaction. The patient knows to call the clinic with any problems, questions or concerns.    Sullivan Lone MD [place] AAHIVMS [facility] Hematology/Oncology Physician [facility]

## 2021-08-19 ENCOUNTER — Telehealth: Payer: Self-pay

## 2021-08-19 NOTE — Telephone Encounter (Signed)
Contacted pt per Dr Irene Limbo:  to let patient know his M spike is minimally higher at 2.1 g/dL up from 1.9 g/dL.  No other evidence of active multiple myeloma.  We will continue to monitor as per discussed plan.   Pt acknowledged and verbalized understanding.

## 2021-09-04 ENCOUNTER — Telehealth: Payer: Self-pay | Admitting: Pharmacist

## 2021-09-04 DIAGNOSIS — D225 Melanocytic nevi of trunk: Secondary | ICD-10-CM | POA: Diagnosis not present

## 2021-09-04 DIAGNOSIS — D2272 Melanocytic nevi of left lower limb, including hip: Secondary | ICD-10-CM | POA: Diagnosis not present

## 2021-09-04 DIAGNOSIS — Z85820 Personal history of malignant melanoma of skin: Secondary | ICD-10-CM | POA: Diagnosis not present

## 2021-09-04 DIAGNOSIS — D485 Neoplasm of uncertain behavior of skin: Secondary | ICD-10-CM | POA: Diagnosis not present

## 2021-09-04 DIAGNOSIS — L821 Other seborrheic keratosis: Secondary | ICD-10-CM | POA: Diagnosis not present

## 2021-09-04 DIAGNOSIS — L570 Actinic keratosis: Secondary | ICD-10-CM | POA: Diagnosis not present

## 2021-09-04 NOTE — Chronic Care Management (AMB) (Signed)
Chronic Care Management Pharmacy Assistant   Name: Troy Howard  MRN: [id number] DOB: [birth year]  Reason for Encounter: Disease State/ Diabetes Assessment Call.   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  Recent consult visits:  08/12/21 Troy Maine MD (Oncology) - seen for smoldering myeloma. No medication changes. Follow up in 4 months.   Hospital visits:  None in previous 6 months  Medications: Outpatient Encounter Medications as of 09/04/2021  Medication Sig Note   aspirin-acetaminophen-caffeine (EXCEDRIN MIGRAINE) 250-250-65 MG tablet Take 1 tablet by mouth every 8 (eight) hours as needed for migraine.    atorvastatin (LIPITOR) 10 MG tablet Take 1 tablet (10 mg total) by mouth daily.    Blood Glucose Monitoring Suppl (ONE TOUCH ULTRA 2) w/Device KIT Use to check blood sugars once daily.    canagliflozin (INVOKANA) 300 MG TABS tablet Take 1 tablet (300 mg total) by mouth daily before breakfast.    CHELATED ZINC PO Take 1 capsule by mouth daily.    Cholecalciferol (VITAMIN D3) 2000 units capsule Take 2,000 Units by mouth daily.     Cyanocobalamin (VITAMIN B 12 PO) Take 2,500 mcg by mouth daily.     esomeprazole (NEXIUM) 40 MG capsule TAKE 1 CAPSULE BY MOUTH EVERY DAY    FOLIC ACID PO Take 800 mcg by mouth daily.     glipiZIDE (GLUCOTROL) 10 MG tablet Take 1 tablet (10 mg total) by mouth 2 (two) times daily before a meal.    glucose blood test strip USE ONCE A DAY TO TEST BLOOD SUGAR    GNP GARLIC EXTRACT PO Take 800 mg by mouth daily.    Levomefolate Glucosamine (METHYLFOLATE PO) Take 1 tablet by mouth daily.    losartan (COZAAR) 100 MG tablet Take 1 tablet (100 mg total) by mouth daily.    Omega-3 Fatty Acids (FISH OIL) 1000 MG CAPS Take by mouth daily.     OneTouch Delica Lancets 30G MISC USE ONCE A DAY WHEN CHECKING BLOOD SUGAR    pyridOXINE (VITAMIN B-6) 100 MG tablet Take 100 mg by mouth daily.    SYNTHROID 137 MCG tablet  Take 1 tablet (137 mcg total) by mouth daily before breakfast.    tamsulosin (FLOMAX) 0.4 MG CAPS capsule Take 2 capsules (0.8 mg total) by mouth daily. (Patient not taking: Reported on 05/22/2021) 03/27/2021: No longer taking   temazepam (RESTORIL) 30 MG capsule TAKE 1 CAPSULE BY MOUTH AT BEDTIME AS NEEDED FOR SLEEP.    Facility-Administered Encounter Medications as of 09/04/2021  Medication   0.9 %  sodium chloride infusion   Fill History: ATORVASTATIN 10 MG TABLET 08/17/2021 90   INVOKANA 300 MG TABLET 08/22/2021 30   ESOMEPRAZOLE MAG DR 40 MG CAP 07/30/2021 90   ONETOUCH VERIO TEST STRIP 08/17/2021 90   SYNTHROID 137 MCG TABLET 06/18/2021 90   LOSARTAN POTASSIUM 100 MG TAB 04/02/2021 90   JANUVIA 100 MG TABLET 05/23/2021 30   TEMAZEPAM 30 MG CAPSULE 08/11/2021 30   GLIPIZIDE 10 MG TABLET 07/30/2021 90   Recent Relevant Labs: Lab Results  Component Value Date/Time   HGBA1C 7.2 (H) 05/20/2021 07:01 AM   HGBA1C 7.2 (A) 02/11/2021 08:56 AM   HGBA1C 8.7 (H) 10/28/2020 01:37 PM   MICROALBUR 7.3 (H) 08/14/2014 10:11 AM   MICROALBUR 1.1 03/22/2012 09:32 AM    Kidney Function Lab Results  Component Value Date/Time   CREATININE 1.67 (H) 08/12/2021 08:56 AM   CREATININE 1.89 (H) 02/25/2021 08:08  AM   CREATININE 1.63 (H) 10/28/2020 01:37 PM   GFR 47.18 (L) 10/24/2019 02:19 PM   GFRNONAA 42 (L) 08/12/2021 08:56 AM   GFRNONAA 40 (L) 10/28/2020 01:37 PM   GFRAA 46 (L) 10/28/2020 01:37 PM    Current antihyperglycemic regimen:  Canagliflozin 300mg  - take 1 tablet by mouth daily before breakfast Glipizide 10mg  - take 1 tablet by mouth two times daily. What recent interventions/DTPs have been made to improve glycemic control:  None. Have there been any recent hospitalizations or ED visits since last visit with CPP? No Patient denies hypoglycemic symptoms, including None Patient denies hyperglycemic symptoms, including none How often are you checking your blood sugar? twice  daily What are your blood sugars ranging? 120, 99, 117, 116, 140 and 124 are some of his recent readings. Patient does not know exact dates.  During the week, how often does your blood glucose drop below 70? Never Are you checking your feet daily/regularly? Yes. Checks everyday.   Adherence Review: Is the patient currently on a STATIN medication? Yes Is the patient currently on ACE/ARB medication? Yes Does the patient have >5 day gap between last estimated fill dates? No  Notes: Spoke with patient and reviewed all medications as prescribed. Patient reports taking all medications the same and no issues at this time. Patients sugars have been normal and he's lost 25 pounds. He would like to come off one of his diabetic oral medications. Patient eats healthy and sugar free items. He had tuna salad, boiled eggs and some crackers for lunch. He had a protein shake for breakfast. He plays gold regularly and stays active. Patient thanked me for my call.   Care Gaps:  AWV - completed on 01/30/21 Hepatitis C screening - never done Tetanus/TDAP - never done Ophthalmology exam - overdue since 2020 Foot exam - overdue since 2021 Covid-19 vaccine booster 4 - overdue since 12/17/20 Flu vaccine - due   Star Rating Drugs:  Atorvastatin 10mg  - [facility] Canagliflozin 300mg  - last [facility]/21 [facility] Glipizide 10mg  - last filled [facility]/30/21 [facility] Losartan 100mg  - last filled on 04/02/21 90DS at [facility] Pharmacist Assistant [phone]

## 2021-09-09 ENCOUNTER — Other Ambulatory Visit: Payer: Self-pay | Admitting: Family Medicine

## 2021-09-10 NOTE — Telephone Encounter (Signed)
Done

## 2021-09-10 NOTE — Telephone Encounter (Signed)
Insurance has requested an alternative for Losartan (Cozaar) 100mg .   Alternatives listed below.  Telmisartan 20mg , 40, 80 Irbesartan 75mg , 150, 300 Valsartan 40mg , 80, 160,320 Candesartan Cilexetil 4mg , 8, 16, 32 Olmesartan 20mg , 40, 60  Please advise

## 2021-10-08 ENCOUNTER — Telehealth: Payer: Self-pay | Admitting: Family Medicine

## 2021-10-08 NOTE — Telephone Encounter (Signed)
Called and spoke with patient, over the last two days he has not take his blood pressure medication Olmesartan 40mg , because over the last year he has lost 40 pounds.     He checked his blood pressure over the last two days.   One day it was 119/89, next day it was 97/69.     I advised patient to continue to take medication until we receive advisement from Dr. Sarajane Jews.    Patient said that when he arrives at home today he will check his blood pressure today and will send a message via my chart with reading.   Next appointment physical 10/29/21

## 2021-10-08 NOTE — Telephone Encounter (Signed)
Patient called because he has not taken his glipiZIDE (GLUCOTROL) 10 MG tablet for blood pressure as his blood pressure has been down the last two days. Patient wants to know if he should continue taking it or only take it of blood pressure is elevated. Also wants to know if he needs an appointment      Good callback number [phone]     Please advise

## 2021-10-09 NOTE — Telephone Encounter (Signed)
Spoke with pt advised of Dr Fry recommendation, pt verbalized understanding 

## 2021-10-09 NOTE — Telephone Encounter (Signed)
Tell him to STOP the Olmesartan until we see him next month

## 2021-10-16 ENCOUNTER — Telehealth: Payer: Self-pay | Admitting: Pharmacist

## 2021-10-16 NOTE — Chronic Care Management (AMB) (Signed)
    Chronic Care Management Pharmacy Assistant   Name: Troy Howard  MRN: [id number] DOB: 05-Oct-1941  Reason for Encounter: Gaps report for blood pressures.  Spoke with patient, he is currently on vacation in [place] and does not have any current blood pressure readings.  Patient was asked when he returns home to check his blood pressures and record them and we will touch base with him next month for updated blood pressure readings at his scheduled follow up visit. Patient agree's.  Medications: Outpatient Encounter Medications as of 10/16/2021  Medication Sig Note   aspirin-acetaminophen-caffeine (EXCEDRIN MIGRAINE) 250-250-65 MG tablet Take 1 tablet by mouth every 8 (eight) hours as needed for migraine.    atorvastatin (LIPITOR) 10 MG tablet Take 1 tablet (10 mg total) by mouth daily.    Blood Glucose Monitoring Suppl (ONE TOUCH ULTRA 2) w/Device KIT Use to check blood sugars once daily.    canagliflozin (INVOKANA) 300 MG TABS tablet Take 1 tablet (300 mg total) by mouth daily before breakfast.    CHELATED ZINC PO Take 1 capsule by mouth daily.    Cholecalciferol (VITAMIN D3) 2000 units capsule Take 2,000 Units by mouth daily.     Cyanocobalamin (VITAMIN B 12 PO) Take 2,500 mcg by mouth daily.     esomeprazole (NEXIUM) 40 MG capsule TAKE 1 CAPSULE BY MOUTH EVERY DAY    FOLIC ACID PO Take 629 mcg by mouth daily.     glipiZIDE (GLUCOTROL) 10 MG tablet Take 1 tablet (10 mg total) by mouth 2 (two) times daily before a meal.    GNP GARLIC EXTRACT PO Take 528 mg by mouth daily.    Levomefolate Glucosamine (METHYLFOLATE PO) Take 1 tablet by mouth daily.    olmesartan (BENICAR) 40 MG tablet Take 1 tablet (40 mg total) by mouth daily.    Omega-3 Fatty Acids (FISH OIL) 1000 MG CAPS Take by mouth daily.     OneTouch Delica Lancets 41L MISC USE ONCE A DAY WHEN CHECKING BLOOD SUGAR    ONETOUCH VERIO test strip USE ONCE A DAY TO TEST BLOOD SUGAR    pyridOXINE (VITAMIN B-6) 100 MG tablet Take  100 mg by mouth daily.    SYNTHROID 137 MCG tablet Take 1 tablet (137 mcg total) by mouth daily before breakfast.    tamsulosin (FLOMAX) 0.4 MG CAPS capsule Take 2 capsules (0.8 mg total) by mouth daily. (Patient not taking: Reported on 05/22/2021) 03/27/2021: No longer taking   temazepam (RESTORIL) 30 MG capsule TAKE 1 CAPSULE BY MOUTH AT BEDTIME AS NEEDED FOR SLEEP.    Facility-Administered Encounter Medications as of 10/16/2021  Medication   0.9 %  sodium chloride infusion    Care Gaps: AWV - completed on 01/30/21 Last BP - 148/70 on 08/12/2021 Hepatitis C screening - never done Tetanus/TDAP - never done Ophthalmology exam - overdue  Foot exam - overdue since 2021 Covid-19 vaccine - overdue Flu vaccine - due   Star Rating Drugs: Atorvastatin 10mg  - last filled on 08/17/21 [facility] Canagliflozin 300mg  - last filled on 09/27/2021 [facility] Glipizide 10mg  - last filled on 07/30/21 [facility] Losartan 100mg  - last filled on 04/02/21 90DS at [facility] [phone]

## 2021-10-29 ENCOUNTER — Ambulatory Visit (INDEPENDENT_AMBULATORY_CARE_PROVIDER_SITE_OTHER): Payer: PPO | Admitting: Family Medicine

## 2021-10-29 ENCOUNTER — Encounter: Payer: Self-pay | Admitting: Family Medicine

## 2021-10-29 VITALS — BP 110/78 | HR 77 | Temp 98.0°F | Ht 71.0 in | Wt 200.0 lb

## 2021-10-29 DIAGNOSIS — Z0000 Encounter for general adult medical examination without abnormal findings: Secondary | ICD-10-CM

## 2021-10-29 DIAGNOSIS — E039 Hypothyroidism, unspecified: Secondary | ICD-10-CM | POA: Diagnosis not present

## 2021-10-29 LAB — CBC WITH DIFFERENTIAL/PLATELET
Basophils Absolute: 0 10*3/uL (ref 0.0–0.1)
Basophils Relative: 0.2 % (ref 0.0–3.0)
Eosinophils Absolute: 0 10*3/uL (ref 0.0–0.7)
Eosinophils Relative: 0.6 % (ref 0.0–5.0)
HCT: 37.3 % — ABNORMAL LOW (ref 39.0–52.0)
Hemoglobin: 12.5 g/dL — ABNORMAL LOW (ref 13.0–17.0)
Lymphocytes Relative: 29.6 % (ref 12.0–46.0)
Lymphs Abs: 1.5 10*3/uL (ref 0.7–4.0)
MCHC: 33.4 g/dL (ref 30.0–36.0)
MCV: 93.4 fl (ref 78.0–100.0)
Monocytes Absolute: 0.6 10*3/uL (ref 0.1–1.0)
Monocytes Relative: 10.8 % (ref 3.0–12.0)
Neutro Abs: 3 10*3/uL (ref 1.4–7.7)
Neutrophils Relative %: 58.8 % (ref 43.0–77.0)
Platelets: 211 10*3/uL (ref 150.0–400.0)
RBC: 4 Mil/uL — ABNORMAL LOW (ref 4.22–5.81)
RDW: 14.5 % (ref 11.5–15.5)
WBC: 5.1 10*3/uL (ref 4.0–10.5)

## 2021-10-29 LAB — BASIC METABOLIC PANEL
BUN: 44 mg/dL — ABNORMAL HIGH (ref 6–23)
CO2: 23 mEq/L (ref 19–32)
Calcium: 9 mg/dL (ref 8.4–10.5)
Chloride: 105 mEq/L (ref 96–112)
Creatinine, Ser: 1.84 mg/dL — ABNORMAL HIGH (ref 0.40–1.50)
GFR: 34.53 mL/min — ABNORMAL LOW (ref >60.00)
Glucose, Bld: 98 mg/dL (ref 70–99)
Potassium: 4.1 mEq/L (ref 3.5–5.1)
Sodium: 135 mEq/L (ref 135–145)

## 2021-10-29 LAB — T4, FREE: Free T4: 0.99 ng/dL (ref 0.60–1.60)

## 2021-10-29 LAB — LIPID PANEL
Cholesterol: 95 mg/dL (ref 0–200)
HDL: 19.5 mg/dL — ABNORMAL LOW (ref >39.00)
LDL Cholesterol: 44 mg/dL (ref 0–99)
NonHDL: 75.97
Total CHOL/HDL Ratio: 5
Triglycerides: 162 mg/dL — ABNORMAL HIGH (ref 0.0–149.0)
VLDL: 32.4 mg/dL (ref 0.0–40.0)

## 2021-10-29 LAB — HEPATIC FUNCTION PANEL
ALT: 14 U/L (ref 0–53)
AST: 13 U/L (ref 0–37)
Albumin: 3.8 g/dL (ref 3.5–5.2)
Alkaline Phosphatase: 68 U/L (ref 39–117)
Bilirubin, Direct: 0.1 mg/dL (ref 0.0–0.3)
Total Bilirubin: 0.6 mg/dL (ref 0.2–1.2)
Total Protein: 8.3 g/dL (ref 6.0–8.3)

## 2021-10-29 LAB — PSA: PSA: 0 ng/mL — ABNORMAL LOW (ref 0.10–4.00)

## 2021-10-29 LAB — T3, FREE: T3, Free: 3.1 pg/mL (ref 2.3–4.2)

## 2021-10-29 LAB — HEMOGLOBIN A1C: Hgb A1c MFr Bld: 6.7 % — ABNORMAL HIGH (ref 4.6–6.5)

## 2021-10-29 LAB — TSH: TSH: 0.21 u[IU]/mL — ABNORMAL LOW (ref 0.35–5.50)

## 2021-10-29 MED ORDER — GLIPIZIDE 10 MG PO TABS: 10.0000 mg | ORAL_TABLET | Freq: Two times a day (BID) | ORAL | 3 refills | Status: DC

## 2021-10-29 MED ORDER — SYNTHROID 137 MCG PO TABS: 137.0000 ug | ORAL_TABLET | Freq: Every day | ORAL | 3 refills | Status: DC

## 2021-10-29 MED ORDER — ESOMEPRAZOLE MAGNESIUM 40 MG PO CPDR: DELAYED_RELEASE_CAPSULE | ORAL | 3 refills | Status: DC

## 2021-10-29 NOTE — Progress Notes (Signed)
   Subjective:    Patient ID: Troy Howard, male    DOB: Jun 10, 1941, 80 y.o.   MRN: [id number]  HPI Here for a well exam. He feels great. 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    Review of Systems  Constitutional: Negative.   HENT: Negative.    Eyes: Negative.   Respiratory: Negative.    Cardiovascular: Negative.   Gastrointestinal: Negative.   Genitourinary: Negative.   Musculoskeletal: Negative.   Skin: Negative.   Neurological: Negative.   Psychiatric/Behavioral: Negative.        Objective:   Physical Exam Constitutional:      General: He is not in acute distress.    Appearance: Normal appearance. He is well-developed. He is not diaphoretic.  HENT:     Head: Normocephalic and atraumatic.     Right Ear: External ear normal.     Left Ear: External ear normal.     Nose: Nose normal.     Mouth/Throat:     Pharynx: No oropharyngeal exudate.  Eyes:     General: No scleral icterus.       Right eye: No discharge.        Left eye: No discharge.     Conjunctiva/sclera: Conjunctivae normal.     Pupils: Pupils are equal, round, and reactive to light.  Neck:     Thyroid: No thyromegaly.     Vascular: No JVD.     Trachea: No tracheal deviation.  Cardiovascular:     Rate and Rhythm: Normal rate and regular rhythm.     Heart sounds: Normal heart sounds. No murmur heard.   No friction rub. No gallop.  Pulmonary:     Effort: Pulmonary effort is normal. No respiratory distress.     Breath sounds: Normal breath sounds. No wheezing or rales.  Chest:     Chest wall: No tenderness.  Abdominal:     General: Bowel sounds are normal. There is no distension.     Palpations: Abdomen is soft. There is no mass.     Tenderness: There is no abdominal tenderness. There is no guarding or rebound.  Genitourinary:    Penis: No tenderness.   Musculoskeletal:        General: No tenderness. Normal range of motion.     Cervical back: Neck supple.  Lymphadenopathy:      Cervical: No cervical adenopathy.  Skin:    General: Skin is warm and dry.     Coloration: Skin is not pale.     Findings: No erythema or rash.  Neurological:     Mental Status: He is alert and oriented to person, place, and time.     Cranial Nerves: No cranial nerve deficit.     Motor: No abnormal muscle tone.     Coordination: Coordination normal.     Deep Tendon Reflexes: Reflexes are normal and symmetric. Reflexes normal.  Psychiatric:        Behavior: Behavior normal.        Thought Content: Thought content normal.        Judgment: Judgment normal.          Assessment & Plan:  Well exam. We discussed diet and exercise. Get fasting labs. [name], MD

## 2021-11-04 LAB — HM DIABETES EYE EXAM

## 2021-11-17 ENCOUNTER — Telehealth: Payer: Self-pay | Admitting: Pharmacist

## 2021-11-17 NOTE — Chronic Care Management (AMB) (Signed)
° ° °  Chronic Care Management Pharmacy Assistant   Name: Troy Howard  MRN: [id number] DOB: Oct 25, 1941  11/19/2021 APPOINTMENT REMINDER   Called [name], left message with patients wife of appointment on 11/19/2021 at 4:00 via telephone visit with [name] Pharm D. Notified to have all medications, supplements, blood pressure and/or blood sugar logs available during appointment and to return call if need to reschedule.   Care Gaps: AWV - completed on 01/30/21 Last BP - 110/78 on 10/29/2021 Hepatitis C screening - never done Tetanus/TDAP - never done Ophthalmology exam - overdue  Foot exam - overdue since 2021 Covid-19 vaccine - overdue Flu vaccine - due  Star Rating Drug: Atorvastatin 10mg  - last [facility] on 11/17/2021 [facility] Canagliflozin 300mg  - last filled on 10/29/2021 [facility] Glipizide 10mg  - last filled on 11/07/2021 [facility] Losartan 100mg  - [facility] filled on 09/10/21 [facility] with [name]  Any gaps in medications fill history? No  [name] [facility]  Catering manager [phone]

## 2021-11-19 ENCOUNTER — Ambulatory Visit (INDEPENDENT_AMBULATORY_CARE_PROVIDER_SITE_OTHER): Payer: PPO | Admitting: Pharmacist

## 2021-11-19 DIAGNOSIS — E119 Type 2 diabetes mellitus without complications: Secondary | ICD-10-CM

## 2021-11-19 DIAGNOSIS — E785 Hyperlipidemia, unspecified: Secondary | ICD-10-CM

## 2021-11-19 NOTE — Progress Notes (Signed)
° °Chronic Care Management °Pharmacy Note ° °11/30/2021 °Name:  Tadan G Kalka MRN:  [id number] DOB:  09/02/1941 ° °Summary: °A1c at goal < 7% °Triglycerides improved but not at goal of < 150 ° °Recommendations/Changes made from today's visit: °-Recommend repeat vitamin D level °-Recommended continued BP and blood sugar monitoring at home ° °Plan: °Follow up DM assessment in 3 months ° ° °Subjective: °Coalton G Bayles is an 80 y.o. year old male who is a primary patient of Fry, Stephen A, MD.  The CCM team was consulted for assistance with disease management and care coordination needs.   ° °Engaged with patient by telephone for follow up visit in response to provider referral for pharmacy case management and/or care coordination services.  ° °Consent to Services:  °The patient was given information about Chronic Care Management services, agreed to services, and gave verbal consent prior to initiation of services.  Please see initial visit note for detailed documentation.  ° °Patient Care Team: °Fry, Stephen A, MD as PCP - General (Family Medicine) °, Madeline G, RPH as Pharmacist (Pharmacist) ° °Recent office visits: °10/29/21 Stephen Fry, MD: Patient presented for annual exam. A1c decreased to 6.7%.  ° °Recent consult visits: °08/12/21 [name] [name] MD (Oncology) - seen for smoldering myeloma. No medication changes. Follow up in 4 months.  ° °05/22/21 Gastro: Patient presented for colonoscopy. ° °05/08/21 Gastro: Patient presented for colonoscopy prep visit. ° °04/09/21 Gautam [name], MD (oncology): Patient presented for monoclonal paraproteinemia follow up.  ° °03/04/21 Gautam [name], MD (oncology): Patient presented for monoclonal paraproteinemia follow up.  ° °09/03/20 Daneil Jones (dermatology): Patient presented for rosacea follow up. Unable to access notes. ° °08/28/20 Richard Ramos (PT): Unable to access notes. ° °07/18/20 Ogechi Ejeigri (nephrology): Patient presented for CKD follow up. Unable to access  notes.  ° °Hospital visits: °10/13/20 Patient presented to urgent care for upper respiratory infection.  ° °Objective: ° °Lab Results  °Component Value Date  ° CREATININE 1.84 (H) 10/29/2021  ° BUN 44 (H) 10/29/2021  ° GFR 34.53 (L) 10/29/2021  ° GFRNONAA 42 (L) 08/12/2021  ° GFRAA 46 (L) 10/28/2020  ° NA 135 10/29/2021  ° K 4.1 10/29/2021  ° CALCIUM 9.0 10/29/2021  ° CO2 23 10/29/2021  ° ° °Lab Results  °Component Value Date/Time  ° HGBA1C 6.7 (H) 10/29/2021 08:27 AM  ° HGBA1C 7.2 (H) 05/20/2021 07:01 AM  ° GFR 34.53 (L) 10/29/2021 08:27 AM  ° GFR 47.18 (L) 10/24/2019 02:19 PM  ° MICROALBUR 7.3 (H) 08/14/2014 10:11 AM  ° MICROALBUR 1.1 03/22/2012 09:32 AM  °  °Last diabetic Eye exam:  °Lab Results  °Component Value Date/Time  ° HMDIABEYEEXA No Retinopathy 09/16/2016 09:15 AM  °  °Last diabetic Foot exam: No results found for: HMDIABFOOTEX  ° °Lab Results  °Component Value Date  ° CHOL 95 10/29/2021  ° HDL 19.50 (L) 10/29/2021  ° LDLCALC 44 10/29/2021  ° LDLDIRECT 55.0 05/20/2021  ° TRIG 162.0 (H) 10/29/2021  ° CHOLHDL 5 10/29/2021  ° ° °Hepatic Function Latest Ref Rng & Units 10/29/2021 08/12/2021 05/20/2021  °Total Protein 6.0 - 8.3 g/dL 8.3 8.6(H) 7.8  °Albumin 3.5 - 5.2 g/dL 3.8 3.3(L) 3.8  °AST 0 - 37 U/L 13 13(L) 11  °ALT 0 - 53 U/L 14 16 12  °Alk Phosphatase 39 - 117 U/L 68 88 74  °Total Bilirubin 0.2 - 1.2 mg/dL 0.6 1.0 0.7  °Bilirubin, Direct 0.0 - 0.3 mg/dL 0.1 - 0.1  ° ° °Lab   Results  °Component Value Date/Time  ° TSH 0.21 (L) 10/29/2021 08:27 AM  ° TSH 0.52 10/28/2020 01:37 PM  ° FREET4 0.99 10/29/2021 08:27 AM  ° FREET4 1.4 10/28/2020 01:37 PM  ° ° °CBC Latest Ref Rng & Units 10/29/2021 08/12/2021 03/27/2021  °WBC 4.0 - 10.5 K/uL 5.1 4.3 4.7  °Hemoglobin 13.0 - 17.0 g/dL 12.5(L) 12.7(L) 13.3  °Hematocrit 39.0 - 52.0 % 37.3(L) 36.6(L) 40.0  °Platelets 150.0 - 400.0 K/uL 211.0 196 209  ° ° °Lab Results  °Component Value Date/Time  ° VD25OH 99.17 02/25/2021 08:08 AM  ° VD25OH 23.50 (L) 02/22/2018 04:41 PM  °  VD25OH 67 10/22/2009 08:53 PM  ° ° °Clinical ASCVD: No  °The ASCVD Risk score (Arnett DK, et al., 2019) failed to calculate for the following reasons: °  The valid HDL cholesterol range is 20 to 100 mg/dL °  The valid total cholesterol range is 130 to 320 mg/dL   ° °Depression screen PHQ 2/9 10/29/2021 01/30/2021 10/28/2020  °Decreased Interest 0 0 0  °Down, Depressed, Hopeless 0 0 0  °PHQ - 2 Score 0 0 0  °Altered sleeping 0 - -  °Tired, decreased energy 0 - -  °Change in appetite 0 - -  °Feeling bad or failure about yourself  0 - -  °Trouble concentrating 0 - -  °Moving slowly or fidgety/restless 0 - -  °Suicidal thoughts 0 - -  °PHQ-9 Score 0 - -  °Difficult doing work/chores Not difficult at all - -  °  ° ° °Social History  ° °Tobacco Use  °Smoking Status Former  ° Packs/day: 0.25  ° Years: 5.00  ° Pack years: 1.25  ° Types: Cigarettes  ° Quit date: 1967  ° Years since quitting: 56.0  °Smokeless Tobacco Never  ° °BP Readings from Last 3 Encounters:  °10/29/21 110/78  °08/12/21 (!) 148/70  °05/22/21 (!) 129/55  ° °Pulse Readings from Last 3 Encounters:  °10/29/21 77  °08/12/21 63  °05/22/21 (!) 53  ° °Wt Readings from Last 3 Encounters:  °10/29/21 200 lb (90.7 kg)  °08/12/21 209 lb 3.2 oz (94.9 kg)  °05/22/21 215 lb (97.5 kg)  ° ° °Assessment/Interventions: Review of patient past medical history, allergies, medications, health status, including review of consultants reports, laboratory and other test data, was performed as part of comprehensive evaluation and provision of chronic care management services.  ° °SDOH:  (Social Determinants of Health) assessments and interventions performed: Yes ° ° °CCM Care Plan ° °No Known Allergies ° °Medications Reviewed Today   ° ° Reviewed by Fry, Stephen A, MD (Physician) on 10/29/21 at 0808  Med List Status: <None>  ° °Medication Order Taking? Sig Documenting Provider Last Dose Status Informant  °0.9 %  sodium chloride infusion [id number]   Stark, Malcolm T, MD  Active    °aspirin-acetaminophen-caffeine (EXCEDRIN MIGRAINE) 250-250-65 MG tablet [id number] Yes Take 1 tablet by mouth every 8 (eight) hours as needed for migraine. [provider] Taking Active Self  °atorvastatin (LIPITOR) 10 MG tablet [id number] Yes Take 1 tablet (10 mg total) by mouth daily. Fry, Stephen A, MD Taking Active   °Blood Glucose Monitoring Suppl (ONE TOUCH ULTRA 2) w/Device KIT [id number] Yes Use to check blood sugars once daily. Fry, Stephen A, MD Taking Active   °canagliflozin (INVOKANA) 300 MG TABS tablet [id number] Yes Take 1 tablet (300 mg total) by mouth daily before breakfast. Fry, Stephen A, MD Taking Active   °CHELATED ZINC PO [id number] Yes Take 1 capsule   by mouth daily. [provider] Taking Active Self  °Cholecalciferol (VITAMIN D3) 2000 units capsule [id number] Yes Take 2,000 Units by mouth daily.  [provider] Taking Active Self  °Cyanocobalamin (VITAMIN B 12 PO) [id number] Yes Take 2,500 mcg by mouth daily.  [provider] Taking Active Self  °esomeprazole (NEXIUM) 40 MG capsule [id number] Yes TAKE 1 CAPSULE BY MOUTH EVERY DAY Fry, Stephen A, MD Taking Active   °FOLIC ACID PO [id number] Yes Take 800 mcg by mouth daily.  [provider] Taking Active Self  °glipiZIDE (GLUCOTROL) 10 MG tablet [id number] Yes Take 1 tablet (10 mg total) by mouth 2 (two) times daily before a meal. Fry, Stephen A, MD Taking Active   °GNP GARLIC EXTRACT PO [id number] Yes Take 800 mg by mouth daily. [provider] Taking Active Self  °Levomefolate Glucosamine (METHYLFOLATE PO) [id number] Yes Take 1 tablet by mouth daily. [provider] Taking Active Self  °olmesartan (BENICAR) 40 MG tablet [id number] Yes Take 1 tablet (40 mg total) by mouth daily. Fry, Stephen A, MD Taking Active   °Omega-3 Fatty Acids (FISH OIL) 1000 MG CAPS [id number] Yes Take by mouth daily.  [provider] Taking Active Self  °OneTouch Delica Lancets 30G MISC [id number] Yes USE ONCE  A DAY WHEN CHECKING BLOOD SUGAR Fry, Stephen A, MD Taking Active   °ONETOUCH VERIO test strip [id number] Yes USE ONCE A DAY TO TEST BLOOD SUGAR Fry, Stephen A, MD Taking Active   °pyridOXINE (VITAMIN B-6) 100 MG tablet [id number] Yes Take 100 mg by mouth daily. [provider] Taking Active Self  °SYNTHROID 137 MCG tablet [id number] Yes Take 1 tablet (137 mcg total) by mouth daily before breakfast. Fry, Stephen A, MD Taking Active   °tamsulosin (FLOMAX) 0.4 MG CAPS capsule [id number]  Take 2 capsules (0.8 mg total) by mouth daily.  °Patient not taking: Reported on 05/22/2021  ° Fry, Stephen A, MD  Active   °         °Med Note (GAINES, JANEL R   Thu Mar 27, 2021  7:24 AM) No longer taking  °temazepam (RESTORIL) 30 MG capsule [id number] Yes TAKE 1 CAPSULE BY MOUTH AT BEDTIME AS NEEDED FOR SLEEP. 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 01/17/2008  ° ERECTILE DYSFUNCTION 11/23/2007  ° HYPERTENSION, BENIGN 11/23/2007  ° PROSTATE SPECIFIC ANTIGEN, ELEVATED 11/23/2007  ° MIGRAINE HEADACHE 06/13/2007  ° ° °Immunization History  °Administered Date(s) Administered  ° Influenza Whole 09/05/2010  ° Influenza, High Dose Seasonal PF 08/20/2015, 09/08/2017, 08/19/2018, 08/28/2019, 08/28/2019  ° Influenza,inj,Quad PF,6+ Mos 08/14/2014  ° Influenza-Unspecified 09/16/2016, 09/08/2017, 09/23/2020, 09/24/2021  ° Moderna Covid-19 Vaccine Bivalent Booster 18yrs & up 09/01/2021  ° Moderna Sars-Covid-2 Vaccination 01/05/2020, 01/31/2020, 09/24/2020  ° Pneumococcal Conjugate-13 08/14/2014  ° Pneumococcal Polysaccharide-23 11/23/2007  ° Zoster Recombinat (Shingrix) 10/17/2018, 01/28/2019  ° Zoster, Live 11/19/2009  ° ° °Patient  reports he has really put in the effort to working on his diet and making lifestyle changes. He has lost 35 lbs over the last year by cutting out fast food and no fried foods and having very little sugar. He eats fruit   about once or twice a day and has been eating a lot leaner meets such as tuna fish and chicken. He is not drinking sodas and only drinking water. 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 ° °He reports his family and grandchildren especially have been motivating for him to make these changes.  ° °Conditions to be addressed/monitored:  °Hypertension, Hyperlipidemia, Diabetes, GERD, Chronic Kidney Disease, Hypothyroidism and migraine, nocturia ° °Conditions addressed this visit: °Diabetes, hyperlipidemia, hypertension ° °Care Plan : CCM Pharmacy Care Plan  °Updates made by , Madeline G, RPH since 11/30/2021 12:00 AM  °  ° °Problem: Problem: Hypertension, Hyperlipidemia, Diabetes, GERD, Chronic Kidney Disease, Hypothyroidism and migraine, nocturia   °  ° °Long-Range Goal: Patient-Specific Goal   °Start Date: 02/06/2021  °Expected End Date: 02/06/2022  °Recent Progress: On track  °Priority: High  °Note:   °Current Barriers:  °Unable to independently monitor therapeutic efficacy ° °Pharmacist Clinical Goal(s):  °Patient will achieve adherence to monitoring guidelines and medication adherence to achieve therapeutic efficacy °achieve control of diabetes as evidenced by A1c  through collaboration with PharmD and provider.  ° °Interventions: °1:1 collaboration with Fry, Stephen A, MD regarding development and update of comprehensive plan of care as evidenced by provider attestation and co-signature °Inter-disciplinary care team collaboration (see longitudinal plan of care) °Comprehensive medication review performed; medication list updated in electronic medical record ° °Hypertension (BP goal <140/90) °-Controlled °-Current treatment: °losartan 100mg,  1 tablet daily °-Medications previously tried: Benicar  °-Current home readings: 120/86, 129/89, 118/79 (checking 2-3 times a week), highest he had was 139  °-Current dietary habits: he has lost 35 lbs over last year and a half and has been able to cut down on fattty foods and sweets °-Current exercise habits: patients states he walks 3 miles/ day °-Denies hypotensive/hypertensive symptoms °-Educated on Importance of home blood pressure monitoring; °-Counseled to monitor BP at home weekly, document, and provide log at future appointments °-Recommended to continue current medication ° °Hyperlipidemia: (LDL goal < 100) °-Uncontrolled °-Current treatment: °Omega-3 Fatty-acids 1000mg, 2 capsules daily °Atorvastatin 10 mg daily  °-Medications previously tried: fenofibrate °-Current dietary patterns: he has cut back on sweets and smaller portion sizes in general; he is eating a lot for fruit and vegetables °-Current exercise habits: patient walks 3 miles a day and is golfing on Mon and Wednesdays  °-Educated on Cholesterol goals;  °Importance of limiting foods high in cholesterol; °Exercise goal of 150 minutes per week; °-Recommended to continue current medication ° °Diabetes (A1c goal <7%) °-Controlled °-Current medications: °Invokana 300 mg 1 tablet daily °glipizide 10mg, 1 tablet twice daily before meals °-Medications previously tried: metformin (kidney function), Januvia (kidney function) °-Current home glucose readings °fasting glucose: 115, 145, 113, 150, 142, 87, 116 °post prandial glucose: 135 (after meals) °-Denies hypoglycemic/hyperglycemic symptoms °-Current meal patterns: he has cut back on sweets and smaller portion sizes in general; he is eating a lot for fruit and vegetables; not eating fast food °-Current exercise: plays golf 3 times a week 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 °-Educated on A1c and blood sugar goals; °Benefits of routine self-monitoring of blood sugar; °Different mechanism for other  diabetes medications  °-Counseled to check feet daily and get yearly eye exams °-Counseled on diet and exercise extensively ° °Hypothyroidism (Goal: TSH 0.35-4.5) °-Controlled °-Current treatment  °levothyroxine 137mcg, 1 tablet once daily before breakfast  °-Medications previously tried: none  °-Recommended to continue   current medication ° °GERD (Goal: minimize symptoms of acid reflux/heartburn) °-Controlled °-Current treatment  °esomeprazole 40mg, 1 capsule daily (around noon time) °-Medications previously tried: none  °-Recommended to continue current medication ° °Nocturia (Goal: minimize symptoms) °-Controlled °-Current treatment  °tamsulosin 0.4mg, 2 capsules daily °-Medications previously tried: none °-Recommended to continue current medication ° °Vitamin D deficiency (Goal: vitamin D 30-100) °-Uncontrolled °-Current treatment  °Vitamin D 2000 units daily °-Medications previously tried: none  °-Recommended to continue current medication °Recommended repeat vitamin D level ° °Vitamin B12 deficiency (Goal: 300-1000) °-Controlled °-Current treatment  °Vitamin B12 2500 mcg daily °-Medications previously tried: none °-Recommended to continue current medication ° °Insomnia (Goal: improve quality and quantity of sleep) °-Controlled °-Current treatment  °Temazepam 30 mg 1 capsule at bedtime as needed °-Medications previously tried: none °-Counseled on practicing good sleep hygiene by setting a sleep schedule and maintaining it, avoid excessive napping, following a nightly routine, avoiding screen time for 30-60 minutes before going to bed, and making the bedroom a cool, quiet and dark space °Patient is not taking every night ° °Health Maintenance °-Vaccine gaps: tetanus °-Current therapy:  °Folic acid daily °Vitamin B6 100 mg daily °Zinc daily °Garlic daily °Excedrin Migraine- takes as needed for occasional migraine °-Educated on Cost vs benefit of each product must be carefully weighed by individual  consumer °-Patient is satisfied with current therapy and denies issues °-Recommended to continue current medication ° °Patient Goals/Self-Care Activities °Over the next 180 days, patient will:  °- take medications as prescribed °check glucose daily, document, and provide at future appointments °check blood pressure weekly, document, and provide at future appointments ° °Follow Up Plan: Telephone follow up appointment with care management team member scheduled for: 6 months  ° °  ° ° °Medication Assistance: None required.  Patient affirms current coverage meets needs. ° °Compliance/Adherence/Medication fill history: °Care Gaps: °Eye exam, foot exam, tetanus, Hep C screening, 2nd dose of shingrix  °Last BP - 110/78 on 10/29/2021 °A1c - 6.7 on 10/29/2021 ° °Star-Rating Drugs: °Atorvastatin 10mg - last filled on 11/17/2021 90[facility] °Canagliflozin 300mg - last filled on 10/29/2021 30DS at [facility] °Glipizide 10mg - last filled on 11/07/2021 90DS at [facility] °Losartan 100mg - last filled on 09/10/21 [facility]DS at [facility] ° °Patient's preferred pharmacy is: ° °[id number] - [place], [place] - [address] °[address] °[place] [address] °Phone: [phone] Fax: [phone] ° ° °Uses pill box? Yes °Pt endorses 100% compliance ° °We discussed: Current pharmacy is preferred with insurance plan and patient is satisfied with pharmacy services °Patient decided to: Continue current medication management strategy ° °Care Plan and Follow Up Patient Decision:  Patient agrees to Care Plan and Follow-up. ° °Plan: Telephone follow up appointment with care management team member scheduled for:  5 months ° °Madeline , PharmD BCACP °Clinical Pharmacist °[facility] °[phone] ° ° ° ° ° °

## 2021-11-28 ENCOUNTER — Other Ambulatory Visit: Payer: Self-pay

## 2021-11-28 DIAGNOSIS — D472 Monoclonal gammopathy: Secondary | ICD-10-CM

## 2021-11-29 DIAGNOSIS — E785 Hyperlipidemia, unspecified: Secondary | ICD-10-CM

## 2021-11-29 DIAGNOSIS — E119 Type 2 diabetes mellitus without complications: Secondary | ICD-10-CM

## 2021-11-30 NOTE — Patient Instructions (Signed)
Hi Shanta,  It was great to get to catch up with you again! I am so glad you are feeling better! Keep up the good work with all of your lifestyle changes!  Please reach out to me if you have any questions or need anything before our follow up!  Best, Maddie  [name], PharmD, [facility]   Visit Information   Goals Addressed   None    Patient Care Plan: CCM Pharmacy Care Plan     Problem Identified: Problem: Hypertension, Hyperlipidemia, Diabetes, GERD, Chronic Kidney Disease, Hypothyroidism and migraine, nocturia      Long-Range Goal: Patient-Specific Goal   Start Date: 02/06/2021  Expected End Date: 02/06/2022  Recent Progress: On track  Priority: High  Note:   Current Barriers:  Unable to independently monitor therapeutic efficacy  Pharmacist Clinical Goal(s):  Patient will achieve adherence to monitoring guidelines and medication adherence to achieve therapeutic efficacy achieve control of diabetes as evidenced by A1c  through collaboration with PharmD and provider.   Interventions: 1:1 collaboration with Laurey Morale, MD regarding development and update of comprehensive plan of care as evidenced by provider attestation and co-signature Inter-disciplinary care team collaboration (see longitudinal plan of care) Comprehensive medication review performed; medication list updated in electronic medical record  Hypertension (BP goal <140/90) -Controlled -Current treatment: losartan 100mg , 1 tablet daily -Medications previously tried: Benicar  -Current home readings: 120/86, 129/89, 118/79 (checking 2-3 times a week), highest he had was 139  -Current dietary habits: he has lost 35 lbs over last year and a half and has been able to cut down on fattty foods and sweets -Current exercise habits: patients states he walks 3 miles/ day -Denies hypotensive/hypertensive symptoms -Educated on Importance of home  blood pressure monitoring; -Counseled to monitor BP at home weekly, document, and provide log at future appointments -Recommended to continue current medication  Hyperlipidemia: (LDL goal < 100) -Uncontrolled -Current treatment: Omega-3 Fatty-acids 1000mg , 2 capsules daily Atorvastatin 10 mg daily  -Medications previously tried: fenofibrate -Current dietary patterns: he has cut back on sweets and smaller portion sizes in general; he is eating a lot for fruit and vegetables -Current exercise habits: patient walks 3 miles a day and is golfing on Mon and Wednesdays  -Educated on Cholesterol goals;  Importance of limiting foods high in cholesterol; Exercise goal of 150 minutes per week; -Recommended to continue current medication  Diabetes (A1c goal <7%) -Controlled -Current medications: Invokana 300 mg 1 tablet daily glipizide 10mg , 1 tablet twice daily before meals -Medications previously tried: metformin (kidney function), Januvia (kidney function) -Current home glucose readings fasting glucose: 115, 145, 113, 150, 142, 87, 116 post prandial glucose: 135 (after meals) -Denies hypoglycemic/hyperglycemic symptoms -Current meal patterns: he has cut back on sweets and smaller portion sizes in general; he is eating a lot for fruit and vegetables; not eating fast food -Current exercise: plays golf 3 times a week 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 -Educated on A1c and blood sugar goals; Benefits of routine self-monitoring of blood sugar; Different mechanism for other diabetes medications  -Counseled to check feet daily and get yearly eye exams -Counseled on diet and exercise extensively  Hypothyroidism (Goal: TSH 0.35-4.5) -Controlled -Current treatment  levothyroxine 181mcg, 1 tablet once daily before breakfast  -Medications previously tried: none  -Recommended to continue current medication  GERD (Goal: minimize symptoms of acid reflux/heartburn) -Controlled -Current  treatment  esomeprazole 40mg , 1 capsule daily (around noon time) -Medications  previously tried: none  -Recommended to continue current medication  Nocturia (Goal: minimize symptoms) -Controlled -Current treatment  tamsulosin 0.4mg , 2 capsules daily -Medications previously tried: none -Recommended to continue current medication  Vitamin D deficiency (Goal: vitamin D 30-100) -Uncontrolled -Current treatment  Vitamin D 2000 units daily -Medications previously tried: none  -Recommended to continue current medication Recommended repeat vitamin D level  Vitamin B12 deficiency (Goal: [phone]) -Controlled -Current treatment  Vitamin B12 2500 mcg daily -Medications previously tried: none -Recommended to continue current medication  Insomnia (Goal: improve quality and quantity of sleep) -Controlled -Current treatment  Temazepam 30 mg 1 capsule at bedtime as needed -Medications previously tried: none -Counseled on practicing good sleep hygiene by setting a sleep schedule and maintaining it, avoid excessive napping, following a nightly routine, avoiding screen time for 30-60 minutes before going to bed, and making the bedroom a cool, quiet and dark space Patient is not taking every night  Health Maintenance -Vaccine gaps: tetanus -Current therapy:  Folic acid daily Vitamin B6 100 mg daily Zinc daily Garlic daily Excedrin Migraine- takes as needed for occasional migraine -Educated on Cost vs benefit of each product must be carefully weighed by individual consumer -Patient is satisfied with current therapy and denies issues -Recommended to continue current medication  Patient Goals/Self-Care Activities Over the next 180 days, patient will:  - take medications as prescribed check glucose daily, document, and provide at future appointments check blood pressure weekly, document, and provide at future appointments  Follow Up Plan: Telephone follow up appointment with care  management team member scheduled for: 6 months       Patient verbalizes understanding of instructions provided today and agrees to view in [place].  Telephone follow up appointment with pharmacy team member scheduled for: 6 months  Viona Gilmore, [facility]

## 2021-12-02 ENCOUNTER — Inpatient Hospital Stay: Payer: PPO | Attending: Hematology

## 2021-12-02 ENCOUNTER — Other Ambulatory Visit: Payer: Self-pay

## 2021-12-02 DIAGNOSIS — E1122 Type 2 diabetes mellitus with diabetic chronic kidney disease: Secondary | ICD-10-CM | POA: Insufficient documentation

## 2021-12-02 DIAGNOSIS — Z809 Family history of malignant neoplasm, unspecified: Secondary | ICD-10-CM | POA: Insufficient documentation

## 2021-12-02 DIAGNOSIS — K219 Gastro-esophageal reflux disease without esophagitis: Secondary | ICD-10-CM | POA: Insufficient documentation

## 2021-12-02 DIAGNOSIS — N183 Chronic kidney disease, stage 3 unspecified: Secondary | ICD-10-CM | POA: Insufficient documentation

## 2021-12-02 DIAGNOSIS — E785 Hyperlipidemia, unspecified: Secondary | ICD-10-CM | POA: Diagnosis not present

## 2021-12-02 DIAGNOSIS — D472 Monoclonal gammopathy: Secondary | ICD-10-CM | POA: Diagnosis not present

## 2021-12-02 DIAGNOSIS — Z79899 Other long term (current) drug therapy: Secondary | ICD-10-CM | POA: Insufficient documentation

## 2021-12-02 DIAGNOSIS — I129 Hypertensive chronic kidney disease with stage 1 through stage 4 chronic kidney disease, or unspecified chronic kidney disease: Secondary | ICD-10-CM | POA: Diagnosis not present

## 2021-12-02 DIAGNOSIS — Z8249 Family history of ischemic heart disease and other diseases of the circulatory system: Secondary | ICD-10-CM | POA: Diagnosis not present

## 2021-12-02 DIAGNOSIS — E039 Hypothyroidism, unspecified: Secondary | ICD-10-CM | POA: Diagnosis not present

## 2021-12-02 DIAGNOSIS — E538 Deficiency of other specified B group vitamins: Secondary | ICD-10-CM | POA: Insufficient documentation

## 2021-12-02 DIAGNOSIS — C9000 Multiple myeloma not having achieved remission: Secondary | ICD-10-CM | POA: Diagnosis not present

## 2021-12-02 DIAGNOSIS — Z87891 Personal history of nicotine dependence: Secondary | ICD-10-CM | POA: Insufficient documentation

## 2021-12-02 LAB — CBC WITH DIFFERENTIAL (CANCER CENTER ONLY)
Abs Immature Granulocytes: 0.02 10*3/uL (ref 0.00–0.07)
Basophils Absolute: 0 10*3/uL (ref 0.0–0.1)
Basophils Relative: 0 %
Eosinophils Absolute: 0 10*3/uL (ref 0.0–0.5)
Eosinophils Relative: 1 %
HCT: 38.3 % — ABNORMAL LOW (ref 39.0–52.0)
Hemoglobin: 12.7 g/dL — ABNORMAL LOW (ref 13.0–17.0)
Immature Granulocytes: 1 %
Lymphocytes Relative: 41 %
Lymphs Abs: 1.5 10*3/uL (ref 0.7–4.0)
MCH: 30.6 pg (ref 26.0–34.0)
MCHC: 33.2 g/dL (ref 30.0–36.0)
MCV: 92.3 fL (ref 80.0–100.0)
Monocytes Absolute: 0.5 10*3/uL (ref 0.1–1.0)
Monocytes Relative: 12 %
Neutro Abs: 1.6 10*3/uL — ABNORMAL LOW (ref 1.7–7.7)
Neutrophils Relative %: 45 %
Platelet Count: 187 10*3/uL (ref 150–400)
RBC: 4.15 MIL/uL — ABNORMAL LOW (ref 4.22–5.81)
RDW: 13.5 % (ref 11.5–15.5)
WBC Count: 3.7 10*3/uL — ABNORMAL LOW (ref 4.0–10.5)
nRBC: 0 % (ref 0.0–0.2)

## 2021-12-02 LAB — CMP (CANCER CENTER ONLY)
ALT: 11 U/L (ref 0–44)
AST: 11 U/L — ABNORMAL LOW (ref 15–41)
Albumin: 3.7 g/dL (ref 3.5–5.0)
Alkaline Phosphatase: 75 U/L (ref 38–126)
Anion gap: 7 (ref 5–15)
BUN: 42 mg/dL — ABNORMAL HIGH (ref 8–23)
CO2: 23 mmol/L (ref 22–32)
Calcium: 9.1 mg/dL (ref 8.9–10.3)
Chloride: 106 mmol/L (ref 98–111)
Creatinine: 1.83 mg/dL — ABNORMAL HIGH (ref 0.61–1.24)
GFR, Estimated: 37 mL/min — ABNORMAL LOW (ref >60)
Glucose, Bld: 111 mg/dL — ABNORMAL HIGH (ref 70–99)
Potassium: 4.6 mmol/L (ref 3.5–5.1)
Sodium: 136 mmol/L (ref 135–145)
Total Bilirubin: 0.6 mg/dL (ref 0.3–1.2)
Total Protein: 8.9 g/dL — ABNORMAL HIGH (ref 6.5–8.1)

## 2021-12-03 LAB — KAPPA/LAMBDA LIGHT CHAINS
Kappa free light chain: 16.3 mg/L (ref 3.3–19.4)
Kappa, lambda light chain ratio: 0.28 (ref 0.26–1.65)
Lambda free light chains: 57.2 mg/L — ABNORMAL HIGH (ref 5.7–26.3)

## 2021-12-07 ENCOUNTER — Other Ambulatory Visit: Payer: Self-pay | Admitting: Family Medicine

## 2021-12-08 LAB — MULTIPLE MYELOMA PANEL, SERUM
Albumin SerPl Elph-Mcnc: 3.5 g/dL (ref 2.9–4.4)
Albumin/Glob SerPl: 0.8 (ref 0.7–1.7)
Alpha 1: 0.3 g/dL (ref 0.0–0.4)
Alpha2 Glob SerPl Elph-Mcnc: 1 g/dL (ref 0.4–1.0)
B-Globulin SerPl Elph-Mcnc: 0.9 g/dL (ref 0.7–1.3)
Gamma Glob SerPl Elph-Mcnc: 2.5 g/dL — ABNORMAL HIGH (ref 0.4–1.8)
Globulin, Total: 4.7 g/dL — ABNORMAL HIGH (ref 2.2–3.9)
IgA: 47 mg/dL — ABNORMAL LOW (ref 61–437)
IgG (Immunoglobin G), Serum: 2965 mg/dL — ABNORMAL HIGH (ref 603–1613)
IgM (Immunoglobulin M), Srm: 27 mg/dL (ref 15–143)
M Protein SerPl Elph-Mcnc: 2.3 g/dL — ABNORMAL HIGH
Total Protein ELP: 8.2 g/dL (ref 6.0–8.5)

## 2021-12-09 ENCOUNTER — Other Ambulatory Visit: Payer: Self-pay

## 2021-12-09 ENCOUNTER — Inpatient Hospital Stay: Payer: PPO | Admitting: Hematology

## 2021-12-09 VITALS — BP 137/81 | HR 84 | Temp 97.7°F | Resp 18 | Wt 200.6 lb

## 2021-12-09 DIAGNOSIS — D472 Monoclonal gammopathy: Secondary | ICD-10-CM | POA: Diagnosis not present

## 2021-12-09 DIAGNOSIS — C9000 Multiple myeloma not having achieved remission: Secondary | ICD-10-CM | POA: Diagnosis not present

## 2021-12-09 NOTE — Telephone Encounter (Signed)
Last refill- 05/29/21--30 capsules, 5 refills Last OV physical- 10/29/21  No future OV scheduled.   Can this patient receive a refill?

## 2021-12-10 ENCOUNTER — Telehealth: Payer: Self-pay | Admitting: Hematology

## 2021-12-10 NOTE — Telephone Encounter (Signed)
Scheduled follow-up appointments per 1/10 los. Patient is aware.

## 2021-12-15 NOTE — Progress Notes (Addendum)
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 male is a here because of positive M-spike. The patient was referred by nephrologist Dr. Signe Colt. The patient presents to the clinic today accompanied by his wife.  He is being seen by nephrologist who pursued further work up to why his Creatinine has increased. Upon workup his M-protein was found to be 0.8g/dl.  He has had HTN and DM for many years that can affects his Kidney function and was thought to be the likely cause of his CKD.   Today his wife notes he has had muscle pain, muscle mass loss and fatigue.   He has HTN, DM, GERD, Hypothyroid, Hyperlipidemia. He was recently taken off statins, meloxicam, ASA and metformin. He takes Nexium for her GERD and has been on for many years.   On review of symptoms, pt notes purposeful weight loss, muscle loss and muscle pain. He also has leg pain from b/l knees to anterior shin.  Interval History:   Troy Howard is here for follow-up of his smoldering myeloma for continued evaluation and management after his last clinic visit about 4 months ago. He notes he has stayed physically active and has no acute new symptoms. No fevers no chills no night sweats. No new focal bone pains. No new fatigue or weight loss. Labs done on 12/02/2021 were reviewed in detail with him  MEDICAL HISTORY:  Past Medical History:  Diagnosis Date   Arthritis    back   Cancer [facility])    prostate, sees Dr. Heloise Purpura    Chronic kidney disease 2017   stage 3 kidney disease   Diabetes mellitus    Erectile dysfunction    GERD (gastroesophageal reflux disease)    Hyperlipidemia     Hypertension    Migraines    Peptic ulcer    Thyroid disease     SURGICAL HISTORY: Past Surgical History:  Procedure Laterality Date   APPENDECTOMY  1950   COLONOSCOPY  05/22/2021   per Dr. Russella Dar, benign polyps, no repeats needed   HERNIA REPAIR     umbilical    MENISCUS REPAIR Left    PROSTATE SURGERY  2009   radical prostatectomy   SPINE SURGERY     L4-L5 fusion per Dr. Trey Sailors    TONSILLECTOMY AND ADENOIDECTOMY  1954    SOCIAL HISTORY: Social History   Socioeconomic History   Marital status: Married    Spouse name: Not on file   Number of children: Not on file   Years of education: Not on file   Highest education level: Not on file  Occupational History   Not on file  Tobacco Use   Smoking status: Former    Packs/day: 0.25    Years: 5.00    Pack years: 1.25    Types: Cigarettes    Quit date: 42    Years since quitting: 56.0   Smokeless tobacco: Never  Vaping Use   Vaping Use: Never used  Substance and Sexual Activity   Alcohol use: Not Currently    Comment: rare one glass every 6 months   Drug use: No   Sexual activity: Yes  Other Topics Concern   Not on file  Social History Narrative   Not on file   Social Determinants of Health   Financial Resource Strain: Low Risk    Difficulty of Paying Living Expenses: Not hard at all  Food Insecurity: No Food Insecurity   Worried About Charity fundraiser in the Last Year: Never true   Arboriculturist in the Last Year: Never true  Transportation Needs: No Transportation Needs   Lack of Transportation (Medical): No   Lack of Transportation (Non-Medical): No  Physical Activity: Inactive   Days of Exercise per Week: 0 days   Minutes of Exercise per Session: 0 min  Stress: No Stress Concern Present   Feeling of Stress : Not at all  Social Connections: Moderately Integrated   Frequency of Communication with Friends and Family: Twice a week   Frequency of Social Gatherings with Friends and Family: More than  three times a week   Attends Religious Services: More than 4 times per year   Active Member of [employer] or Organizations: No   Attends Music therapist: Never   Marital Status: Married  Human resources officer Violence: Not At Risk   Fear of Current or Ex-Partner: No   Emotionally Abused: No   Physically Abused: No   Sexually Abused: No    FAMILY HISTORY: Family History  Problem Relation Age of Onset   Hypertension Other    Cancer Other    Colon cancer Neg Hx    Colon polyps Neg Hx    Esophageal cancer Neg Hx    Rectal cancer Neg Hx    Stomach cancer Neg Hx     ALLERGIES:  has No Known Allergies.  MEDICATIONS:  Current Outpatient Medications  Medication Sig Dispense Refill   aspirin-acetaminophen-caffeine (EXCEDRIN MIGRAINE) 250-250-65 MG tablet Take 1 tablet by mouth every 8 (eight) hours as needed for migraine.     atorvastatin (LIPITOR) 10 MG tablet Take 1 tablet (10 mg total) by mouth daily. 90 tablet 3   Blood Glucose Monitoring Suppl (ONE TOUCH ULTRA 2) w/Device KIT Use to check blood sugars once daily. 1 kit 0   canagliflozin (INVOKANA) 300 MG TABS tablet Take 1 tablet (300 mg total) by mouth daily before breakfast. 30 tablet 11   CHELATED ZINC PO Take 1 capsule by mouth daily.     Cholecalciferol (VITAMIN D3) 2000 units capsule Take 2,000 Units by mouth daily.      Cyanocobalamin (VITAMIN B 12 PO) Take 2,500 mcg by mouth daily.      esomeprazole (NEXIUM) 40 MG capsule TAKE 1 CAPSULE BY MOUTH EVERY DAY 90 capsule 3   FOLIC ACID PO Take 161 mcg by mouth daily.      glipiZIDE (GLUCOTROL) 10 MG tablet Take 1 tablet (10 mg total) by mouth 2 (two) times daily before a meal. 180 tablet 3   GNP GARLIC EXTRACT PO Take 096 mg by mouth daily.     Levomefolate Glucosamine (METHYLFOLATE PO) Take 1 tablet by mouth daily.     olmesartan (BENICAR) 40 MG tablet Take 1 tablet (40 mg total) by mouth daily. 90 tablet 3   Omega-3 Fatty Acids (FISH OIL) 1000 MG CAPS Take by mouth  daily.      OneTouch Delica Lancets 04V MISC USE ONCE A DAY WHEN CHECKING BLOOD SUGAR 100 each 3   ONETOUCH VERIO test strip USE ONCE A DAY TO TEST BLOOD SUGAR 100 strip 3   pyridOXINE (VITAMIN B-6) 100 MG tablet Take 100 mg by mouth daily.  SYNTHROID 137 MCG tablet Take 1 tablet (137 mcg total) by mouth daily before breakfast. 90 tablet 3   tamsulosin (FLOMAX) 0.4 MG CAPS capsule Take 2 capsules (0.8 mg total) by mouth daily. (Patient not taking: Reported on 05/22/2021) 180 capsule 3   temazepam (RESTORIL) 30 MG capsule TAKE 1 CAPSULE BY MOUTH AT BEDTIME AS NEEDED FOR SLEEP 30 capsule 5   Current Facility-Administered Medications  Medication Dose Route Frequency Provider Last Rate Last Admin   0.9 %  sodium chloride infusion  500 mL Intravenous Once Ladene Artist, MD        REVIEW OF SYSTEMS:   .10 Point review of Systems was done is negative except as noted above.  PHYSICAL EXAMINATION:  ECOG PERFORMANCE STATUS: 1 - Symptomatic but completely ambulatory  Vitals:   12/09/21 0915  BP: 137/81  Pulse: 84  Resp: 18  Temp: 97.7 F (36.5 C)  SpO2: 100%   Filed Weights   12/09/21 0915  Weight: 200 lb 9.6 oz (91 kg)   .Body mass index is 27.98 kg/m.   GENERAL:alert, in no acute distress and comfortable SKIN: no acute rashes, no significant lesions EYES: conjunctiva are pink and non-injected, sclera anicteric OROPHARYNX: MMM, no exudates, no oropharyngeal erythema or ulceration NECK: supple, no JVD LYMPH:  no palpable lymphadenopathy in the cervical, axillary or inguinal regions LUNGS: clear to auscultation b/l with normal respiratory effort HEART: regular rate & rhythm ABDOMEN:  normoactive bowel sounds , non tender, not distended. Extremity: no pedal edema PSYCH: alert & oriented x 3 with fluent speech NEURO: no focal motor/sensory deficits   LABORATORY DATA:  I have reviewed the data as listed  . CBC Latest Ref Rng & Units 12/02/2021 10/29/2021 08/12/2021  WBC 4.0  - 10.5 K/uL 3.7(L) 5.1 4.3  Hemoglobin 13.0 - 17.0 g/dL 12.7(L) 12.5(L) 12.7(L)  Hematocrit 39.0 - 52.0 % 38.3(L) 37.3(L) 36.6(L)  Platelets 150 - 400 K/uL 187 211.0 196   . CBC    Component Value Date/Time   WBC 3.7 (L) 12/02/2021 0736   WBC 5.1 10/29/2021 0827   RBC 4.15 (L) 12/02/2021 0736   HGB 12.7 (L) 12/02/2021 0736   HCT 38.3 (L) 12/02/2021 0736   PLT 187 12/02/2021 0736   MCV 92.3 12/02/2021 0736   MCH 30.6 12/02/2021 0736   MCHC 33.2 12/02/2021 0736   RDW 13.5 12/02/2021 0736   LYMPHSABS 1.5 12/02/2021 0736   MONOABS 0.5 12/02/2021 0736   EOSABS 0.0 12/02/2021 0736   BASOSABS 0.0 12/02/2021 0736    . CMP Latest Ref Rng & Units 12/02/2021 10/29/2021 08/12/2021  Glucose 70 - 99 mg/dL 111(H) 98 117(H)  BUN 8 - 23 mg/dL 42(H) 44(H) 31(H)  Creatinine 0.61 - 1.24 mg/dL 1.83(H) 1.84(H) 1.67(H)  Sodium 135 - 145 mmol/L 136 135 137  Potassium 3.5 - 5.1 mmol/L 4.6 4.1 4.4  Chloride 98 - 111 mmol/L 106 105 106  CO2 22 - 32 mmol/L $RemoveB'23 23 22  'vMIjVSfZ$ Calcium 8.9 - 10.3 mg/dL 9.1 9.0 9.4  Total Protein 6.5 - 8.1 g/dL 8.9(H) 8.3 8.6(H)  Total Bilirubin 0.3 - 1.2 mg/dL 0.6 0.6 1.0  Alkaline Phos 38 - 126 U/L 75 68 88  AST 15 - 41 U/L 11(L) 13 13(L)  ALT 0 - 44 U/L $Remo'11 14 16     'LwYbj$ 03/27/2021 BM Bx   DIAGNOSIS:   BONE MARROW, ASPIRATE, CLOT, CORE:  -  Hypercellular bone marrow with involvement by plasma cell neoplasm  -  See comment  COMMENT:   Plasma cells are increased on aspirate smears (8% by manual differential  count) and by CD138 immunohistochemistry on the core biopsy and clot  section (15%).  Plasma cells express lambda-restricted light chains by  in situ hybridization.  Together the findings are consistent with bone  marrow involvement by a lambda-restricted plasma cell neoplasm.  Correlation with clinical impressions, radiographic studies, other  laboratory data, and cytogenetic/FISH results is recommended.   03/27/2021 Molecular Pathology    03/27/2021  Cytogenetics    RADIOGRAPHIC STUDIES: I have personally reviewed the radiological images as listed and agreed with the findings in the report. No results found.   ASSESSMENT & PLAN:   Troy Howard is a 81 y.o. caucasian male with    1. IgG Lambda smoldering myeloma He has a M-spike of 0.8g/dl and normal SFLC and ratio. No anemia - nl Hgb No focal bone pains --- Skeletal survey - shows no  No hypercalcemia. 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  05/10/18 Metastatic Bone Survey revealed no lytic lesions on skeletal survey to suggest multiple myeloma.   PLAN:  -Patient reports no acute new symptoms suggestive of smoldering myeloma progression at this time. -Labs done on 12/02/2021 showed stable CBC with a hemoglobin of 12.7, WBC count of 3.7k and normal platelets of 187k CMP with stable chronic kidney disease creatinine of 1.83 which is at baseline.  No hypercalcemia calcium 9.1 Myeloma panel shows M protein of 2.3 which is slightly higher than his previous level of 2.1 but does show gradually increasing trend over the last year. Serum kappa lambda free light chains normal ratio -Given his progressive increasing M protein we discussed and patient is agreeable for repeat PET CT scan prior to his next clinic visit to rule out any presence of new skeletal myeloma lesions that would trigger need to initiate treatment. -Will continue with watchful observation at this time. --Continue 2000 IU Vitamin D daily. -Will see back in 4 months with labs 1 week prior and PET/CT prior to clinic visit.    2. 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 Hollie Salk  -mild non nephrotic proteinuria on 24h UPEP-Continue follow up with Dr Hollie Salk for CKD    3. Vitamin B12 deficient  Antibody testing done - no evidence of pernicious anemia. No evidence of pernicious anemia based on neg IF and parietal  cell ab -continue B12 replacement per PCP  4.  Past Medical History:  Diagnosis Date   Arthritis    back   Cancer [facility])    prostate, sees Dr. Raynelle Bring    Chronic kidney disease 2017   stage 3 kidney disease   Diabetes mellitus    Erectile dysfunction    GERD (gastroesophageal reflux disease)    Hyperlipidemia    Hypertension    Migraines    Peptic ulcer    Thyroid disease     FOLLOW UP: Phone visit with Dr Irene Limbo in 4 months Plz schedule lab appointment 1 week prior to phone visit PET/CT in 12 weeks  All of the patient's questions were answered with apparent satisfaction. The patient knows to call the clinic with any problems, questions or concerns.    Sullivan Lone MD [place] AAHIVMS [facility] Hematology/Oncology Physician [facility]

## 2021-12-29 ENCOUNTER — Telehealth (INDEPENDENT_AMBULATORY_CARE_PROVIDER_SITE_OTHER): Payer: PPO | Admitting: Family Medicine

## 2021-12-29 ENCOUNTER — Encounter: Payer: Self-pay | Admitting: Family Medicine

## 2021-12-29 DIAGNOSIS — U071 COVID-19: Secondary | ICD-10-CM | POA: Insufficient documentation

## 2021-12-29 MED ORDER — MOLNUPIRAVIR EUA 200MG CAPSULE: 4.0000 | ORAL_CAPSULE | Freq: Two times a day (BID) | ORAL | 0 refills | Status: AC

## 2021-12-29 NOTE — Progress Notes (Signed)
Subjective:    Patient ID: Troy Howard, male    DOB: Jun 15, 1941, 81 y.o.   MRN: [id number]  HPI Virtual Visit via Video Note  I connected with the patient on 12/29/21 at  2:15 PM EST by a video enabled telemedicine application and verified that I am speaking with the correct person using two identifiers.  Location patient: home Location provider:work or home office Persons participating in the virtual visit: patient, provider  I discussed the limitations of evaluation and management by telemedicine and the availability of in person appointments. The patient expressed understanding and agreed to proceed.   HPI: Here for a Covid-19 infection. 2 days ago he developed fever to 100 degrees, body aches, headache, and a dry cough. Drinking fluids and taking Excedrin Migraine or Tylenol. No chest pain or SOB. He tested positive for the Covid virus yesterday.   ROS: See pertinent positives and negatives per HPI.  Past Medical History:  Diagnosis Date   Arthritis    back   Cancer [facility])    prostate, sees Dr. Raynelle Bring    Chronic kidney disease 2017   stage 3 kidney disease   Diabetes mellitus    Erectile dysfunction    GERD (gastroesophageal reflux disease)    Hyperlipidemia    Hypertension    Migraines    Peptic ulcer    Thyroid disease     Past Surgical History:  Procedure Laterality Date   APPENDECTOMY  1950   COLONOSCOPY  05/22/2021   per Dr. Fuller Plan, benign polyps, no repeats needed   HERNIA REPAIR     umbilical    MENISCUS REPAIR Left    PROSTATE SURGERY  2009   radical prostatectomy   SPINE SURGERY     L4-L5 fusion per Dr. Glenna Fellows    TONSILLECTOMY AND ADENOIDECTOMY  1954    Family History  Problem Relation Age of Onset   Hypertension Other    Cancer Other    Colon cancer Neg Hx    Colon polyps Neg Hx    Esophageal cancer Neg Hx    Rectal cancer Neg Hx    Stomach cancer Neg Hx      Current Outpatient Medications:     aspirin-acetaminophen-caffeine (EXCEDRIN MIGRAINE) 250-250-65 MG tablet, Take 1 tablet by mouth every 8 (eight) hours as needed for migraine., Disp: , Rfl:    atorvastatin (LIPITOR) 10 MG tablet, Take 1 tablet (10 mg total) by mouth daily., Disp: 90 tablet, Rfl: 3   Blood Glucose Monitoring Suppl (ONE TOUCH ULTRA 2) w/Device KIT, Use to check blood sugars once daily., Disp: 1 kit, Rfl: 0   canagliflozin (INVOKANA) 300 MG TABS tablet, Take 1 tablet (300 mg total) by mouth daily before breakfast., Disp: 30 tablet, Rfl: 11   CHELATED ZINC PO, Take 1 capsule by mouth daily., Disp: , Rfl:    Cholecalciferol (VITAMIN D3) 2000 units capsule, Take 2,000 Units by mouth daily. , Disp: , Rfl:    Cyanocobalamin (VITAMIN B 12 PO), Take 2,500 mcg by mouth daily. , Disp: , Rfl:    esomeprazole (NEXIUM) 40 MG capsule, TAKE 1 CAPSULE BY MOUTH EVERY DAY, Disp: 90 capsule, Rfl: 3   FOLIC ACID PO, Take 371 mcg by mouth daily. , Disp: , Rfl:    glipiZIDE (GLUCOTROL) 10 MG tablet, Take 1 tablet (10 mg total) by mouth 2 (two) times daily before a meal., Disp: 180 tablet, Rfl: 3   GNP GARLIC EXTRACT PO, Take 800 mg by mouth daily.,  Disp: , Rfl:    Levomefolate Glucosamine (METHYLFOLATE PO), Take 1 tablet by mouth daily., Disp: , Rfl:    molnupiravir EUA (LAGEVRIO) 200 mg CAPS capsule, Take 4 capsules (800 mg total) by mouth 2 (two) times daily for 5 days., Disp: 40 capsule, Rfl: 0   olmesartan (BENICAR) 40 MG tablet, Take 1 tablet (40 mg total) by mouth daily., Disp: 90 tablet, Rfl: 3   Omega-3 Fatty Acids (FISH OIL) 1000 MG CAPS, Take by mouth daily. , Disp: , Rfl:    OneTouch Delica Lancets 36U MISC, USE ONCE A DAY WHEN CHECKING BLOOD SUGAR, Disp: 100 each, Rfl: 3   ONETOUCH VERIO test strip, USE ONCE A DAY TO TEST BLOOD SUGAR, Disp: 100 strip, Rfl: 3   pyridOXINE (VITAMIN B-6) 100 MG tablet, Take 100 mg by mouth daily., Disp: , Rfl:    SYNTHROID 137 MCG tablet, Take 1 tablet (137 mcg total) by mouth daily before  breakfast., Disp: 90 tablet, Rfl: 3   temazepam (RESTORIL) 30 MG capsule, TAKE 1 CAPSULE BY MOUTH AT BEDTIME AS NEEDED FOR SLEEP, Disp: 30 capsule, Rfl: 5  Current Facility-Administered Medications:    0.9 %  sodium chloride infusion, 500 mL, Intravenous, Once, Ladene Artist, MD  EXAM:  VITALS per patient if applicable:  GENERAL: alert, oriented, appears well and in no acute distress  HEENT: atraumatic, conjunttiva clear, no obvious abnormalities on inspection of external nose and ears  NECK: normal movements of the head and neck  LUNGS: on inspection no signs of respiratory distress, breathing rate appears normal, no obvious gross SOB, gasping or wheezing  CV: no obvious cyanosis  MS: moves all visible extremities without noticeable abnormality  PSYCH/NEURO: pleasant and cooperative, no obvious depression or anxiety, speech and thought processing grossly intact  ASSESSMENT AND PLAN: Covid infection, treat with 5 days of Molnupiravir. He will quarantine for 5 days. [name], MD  Discussed the following assessment and plan:  No diagnosis found.     I discussed the assessment and treatment plan with the patient. The patient was provided an opportunity to ask questions and all were answered. The patient agreed with the plan and demonstrated an understanding of the instructions.   The patient was advised to call back or seek an in-person evaluation if the symptoms worsen or if the condition fails to improve as anticipated.      Review of Systems     Objective:   Physical Exam        Assessment & Plan:

## 2022-01-15 ENCOUNTER — Ambulatory Visit (INDEPENDENT_AMBULATORY_CARE_PROVIDER_SITE_OTHER): Payer: PPO

## 2022-01-15 VITALS — Ht 71.0 in | Wt 201.0 lb

## 2022-01-15 DIAGNOSIS — Z0000 Encounter for general adult medical examination without abnormal findings: Secondary | ICD-10-CM

## 2022-01-15 NOTE — Patient Instructions (Addendum)
[name] , Thank you for taking time to come for your Medicare Wellness Visit. I appreciate your ongoing commitment to your health goals. Please review the following plan we discussed and let me know if I can assist you in the future.   These are the goals we discussed:  Goals       Patient Stated (pt-stated)      I would like to stay active.         This is a list of the screening recommended for you and due dates:  Health Maintenance  Topic Date Due   Eye exam for diabetics  05/18/2019   Complete foot exam   10/23/2020   Tetanus Vaccine  10/29/2022*   Hepatitis C Screening: USPSTF Recommendation to screen - Ages 18-79 yo.  01/15/2023*   Hemoglobin A1C  04/28/2022   Pneumonia Vaccine  Completed   Flu Shot  Completed   COVID-19 Vaccine  Completed   Zoster (Shingles) Vaccine  Completed   HPV Vaccine  Aged Out   Colon Cancer Screening  Discontinued  *Topic was postponed. The date shown is not the original due date.   Advanced directives: No Patient will bring copy  Conditions/risks identified: None  Next appointment: Follow up in one year for your annual wellness visit.   Preventive Care 25 Years and Older, Male Preventive care refers to lifestyle choices and visits with your health care provider that can promote health and wellness. What does preventive care include? A yearly physical exam. This is also called an annual well check. Dental exams once or twice a year. Routine eye exams. Ask your health care provider how often you should have your eyes checked. Personal lifestyle choices, including: Daily care of your teeth and gums. Regular physical activity. Eating a healthy diet. Avoiding tobacco and drug use. Limiting alcohol use. Practicing safe sex. Taking low doses of aspirin every day. Taking vitamin and mineral supplements as recommended by your health care provider. What happens during an annual well check? The services and screenings done by your health  care provider during your annual well check will depend on your age, overall health, lifestyle risk factors, and family history of disease. Counseling  Your health care provider may ask you questions about your: Alcohol use. Tobacco use. Drug use. Emotional well-being. Home and relationship well-being. Sexual activity. Eating habits. History of falls. Memory and ability to understand (cognition). Work and work Statistician. Screening  You may have the following tests or measurements: Height, weight, and BMI. Blood pressure. Lipid and cholesterol levels. These may be checked every 5 years, or more frequently if you are over 38 years old. Skin check. Lung cancer screening. You may have this screening every year starting at age 24 if you have a 30-pack-year history of smoking and currently smoke or have quit within the past 15 years. Fecal occult blood test (FOBT) of the stool. You may have this test every year starting at age 57. Flexible sigmoidoscopy or colonoscopy. You may have a sigmoidoscopy every 5 years or a colonoscopy every 10 years starting at age 3. Prostate cancer screening. Recommendations will vary depending on your family history and other risks. Hepatitis C blood test. Hepatitis B blood test. Sexually transmitted disease (STD) testing. Diabetes screening. This is done by checking your blood sugar (glucose) after you have not eaten for a while (fasting). You may have this done every 1-3 years. Abdominal aortic aneurysm (AAA) screening. You may need this if you are a current or  former smoker. Osteoporosis. You may be screened starting at age 3 if you are at high risk. Talk with your health care provider about your test results, treatment options, and if necessary, the need for more tests. Vaccines  Your health care provider may recommend certain vaccines, such as: Influenza vaccine. This is recommended every year. Tetanus, diphtheria, and acellular pertussis (Tdap, Td)  vaccine. You may need a Td booster every 10 years. Zoster vaccine. You may need this after age 19. Pneumococcal 13-valent conjugate (PCV13) vaccine. One dose is recommended after age 64. Pneumococcal polysaccharide (PPSV23) vaccine. One dose is recommended after age 34. Talk to your health care provider about which screenings and vaccines you need and how often you need them. This information is not intended to replace advice given to you by your health care provider. Make sure you discuss any questions you have with your health care provider. Document Released: 12/13/2015 Document Revised: 08/05/2016 Document Reviewed: 09/17/2015 Elsevier Interactive Patient Education  2017 [place] Prevention in the Home Falls can cause injuries. They can happen to people of all ages. There are many things you can do to make your home safe and to help prevent falls. What can I do on the outside of my home? Regularly fix the edges of walkways and driveways and fix any cracks. Remove anything that might make you trip as you walk through a door, such as a raised step or threshold. Trim any bushes or trees on the path to your home. Use bright outdoor lighting. Clear any walking paths of anything that might make someone trip, such as rocks or tools. Regularly check to see if handrails are loose or broken. Make sure that both sides of any steps have handrails. Any raised decks and porches should have guardrails on the edges. Have any leaves, snow, or ice cleared regularly. Use sand or salt on walking paths during winter. Clean up any spills in your garage right away. This includes oil or grease spills. What can I do in the bathroom? Use night lights. Install grab bars by the toilet and in the tub and shower. Do not use towel bars as grab bars. Use non-skid mats or decals in the tub or shower. If you need to sit down in the shower, use a plastic, non-slip stool. Keep the floor dry. Clean up any  water that spills on the floor as soon as it happens. Remove soap buildup in the tub or shower regularly. Attach bath mats securely with double-sided non-slip rug tape. Do not have throw rugs and other things on the floor that can make you trip. What can I do in the bedroom? Use night lights. Make sure that you have a light by your bed that is easy to reach. Do not use any sheets or blankets that are too big for your bed. They should not hang down onto the floor. Have a firm chair that has side arms. You can use this for support while you get dressed. Do not have throw rugs and other things on the floor that can make you trip. What can I do in the kitchen? Clean up any spills right away. Avoid walking on wet floors. Keep items that you use a lot in easy-to-reach places. If you need to reach something above you, use a strong step stool that has a grab bar. Keep electrical cords out of the way. Do not use floor polish or wax that makes floors slippery. If you must use wax, use  non-skid floor wax. Do not have throw rugs and other things on the floor that can make you trip. What can I do with my stairs? Do not leave any items on the stairs. Make sure that there are handrails on both sides of the stairs and use them. Fix handrails that are broken or loose. Make sure that handrails are as long as the stairways. Check any carpeting to make sure that it is firmly attached to the stairs. Fix any carpet that is loose or worn. Avoid having throw rugs at the top or bottom of the stairs. If you do have throw rugs, attach them to the floor with carpet tape. Make sure that you have a light switch at the top of the stairs and the bottom of the stairs. If you do not have them, ask someone to add them for you. What else can I do to help prevent falls? Wear shoes that: Do not have high heels. Have rubber bottoms. Are comfortable and fit you well. Are closed at the toe. Do not wear sandals. If you use a  stepladder: Make sure that it is fully opened. Do not climb a closed stepladder. Make sure that both sides of the stepladder are locked into place. Ask someone to hold it for you, if possible. Clearly mark and make sure that you can see: Any grab bars or handrails. First and last steps. Where the edge of each step is. Use tools that help you move around (mobility aids) if they are needed. These include: Canes. Walkers. Scooters. Crutches. Turn on the lights when you go into a dark area. Replace any light bulbs as soon as they burn out. Set up your furniture so you have a clear path. Avoid moving your furniture around. If any of your floors are uneven, fix them. If there are any pets around you, be aware of where they are. Review your medicines with your doctor. Some medicines can make you feel dizzy. This can increase your chance of falling. Ask your doctor what other things that you can do to help prevent falls. This information is not intended to replace advice given to you by your health care provider. Make sure you discuss any questions you have with your health care provider. Document Released: 09/12/2009 Document Revised: 04/23/2016 Document Reviewed: 12/21/2014 Elsevier Interactive Patient Education  2017 [employer].

## 2022-01-15 NOTE — Progress Notes (Signed)
Subjective:   Troy Howard is a 81 y.o. male who presents for Medicare Annual/Subsequent preventive examination.  Review of Systems    Virtual Visit via Telephone Note  I connected with  Troy Howard on 01/15/22 at  9:00 AM EST by telephone and verified that I am speaking with the correct person using two identifiers.  Location: Patient: Home Provider: Office Persons participating in the virtual visit: patient/Nurse Health Advisor   I discussed the limitations, risks, security and privacy concerns of performing an evaluation and management service by telephone and the availability of in person appointments. The patient expressed understanding and agreed to proceed.  Interactive audio and video telecommunications were attempted between this nurse and patient, however failed, due to patient having technical difficulties OR patient did not have access to video capability.  We continued and completed visit with audio only.  Some vital signs may be absent or patient reported.   Tillie Rung, LPN  Cardiac Risk Factors include: advanced age (>34men, >80 women);male gender;hypertension     Objective:    Today's Vitals   01/15/22 0857  Weight: 201 lb (91.2 kg)  Height: 5\' 11"  (1.803 m)   Body mass index is 28.03 kg/m.  Advanced Directives 01/15/2022 03/27/2021 01/30/2021 07/12/2018  Does Patient Have a Medical Advance Directive? Yes Yes Yes Yes  Type of Advance Directive Living will Living will Living will -  Does patient want to make changes to medical advance directive? No - Patient declined No - Patient declined No - Patient declined No - Patient declined    Current Medications (verified) Outpatient Encounter Medications as of 01/15/2022  Medication Sig   aspirin-acetaminophen-caffeine (EXCEDRIN MIGRAINE) 250-250-65 MG tablet Take 1 tablet by mouth every 8 (eight) hours as needed for migraine.   atorvastatin (LIPITOR) 10 MG tablet Take 1 tablet (10 mg total) by  mouth daily.   Blood Glucose Monitoring Suppl (ONE TOUCH ULTRA 2) w/Device KIT Use to check blood sugars once daily.   canagliflozin (INVOKANA) 300 MG TABS tablet Take 1 tablet (300 mg total) by mouth daily before breakfast.   CHELATED ZINC PO Take 1 capsule by mouth daily.   Cholecalciferol (VITAMIN D3) 2000 units capsule Take 2,000 Units by mouth daily.    Cyanocobalamin (VITAMIN B 12 PO) Take 2,500 mcg by mouth daily.    esomeprazole (NEXIUM) 40 MG capsule TAKE 1 CAPSULE BY MOUTH EVERY DAY   FOLIC ACID PO Take 800 mcg by mouth daily.    glipiZIDE (GLUCOTROL) 10 MG tablet Take 1 tablet (10 mg total) by mouth 2 (two) times daily before a meal.   GNP GARLIC EXTRACT PO Take 800 mg by mouth daily.   Levomefolate Glucosamine (METHYLFOLATE PO) Take 1 tablet by mouth daily.   olmesartan (BENICAR) 40 MG tablet Take 1 tablet (40 mg total) by mouth daily.   Omega-3 Fatty Acids (FISH OIL) 1000 MG CAPS Take by mouth daily.    OneTouch Delica Lancets 30G MISC USE ONCE A DAY WHEN CHECKING BLOOD SUGAR   ONETOUCH VERIO test strip USE ONCE A DAY TO TEST BLOOD SUGAR   pyridOXINE (VITAMIN B-6) 100 MG tablet Take 100 mg by mouth daily.   SYNTHROID 137 MCG tablet Take 1 tablet (137 mcg total) by mouth daily before breakfast.   temazepam (RESTORIL) 30 MG capsule TAKE 1 CAPSULE BY MOUTH AT BEDTIME AS NEEDED FOR SLEEP   Facility-Administered Encounter Medications as of 01/15/2022  Medication   0.9 %  sodium chloride infusion  Allergies (verified) Patient has no known allergies.   History: Past Medical History:  Diagnosis Date   Arthritis    back   Cancer [facility])    prostate, sees Dr. Raynelle Bring    Chronic kidney disease 2017   stage 3 kidney disease   Diabetes mellitus    Erectile dysfunction    GERD (gastroesophageal reflux disease)    Hyperlipidemia    Hypertension    Migraines    Peptic ulcer    Thyroid disease    Past Surgical History:  Procedure Laterality Date   APPENDECTOMY  1950    COLONOSCOPY  05/22/2021   per Dr. Fuller Plan, benign polyps, no repeats needed   HERNIA REPAIR     umbilical    MENISCUS REPAIR Left    PROSTATE SURGERY  2009   radical prostatectomy   SPINE SURGERY     L4-L5 fusion per Dr. Glenna Fellows    TONSILLECTOMY AND ADENOIDECTOMY  1954   Family History  Problem Relation Age of Onset   Hypertension Other    Cancer Other    Colon cancer Neg Hx    Colon polyps Neg Hx    Esophageal cancer Neg Hx    Rectal cancer Neg Hx    Stomach cancer Neg Hx    Social History   Socioeconomic History   Marital status: Married    Spouse name: Not on file   Number of children: Not on file   Years of education: Not on file   Highest education level: Not on file  Occupational History   Not on file  Tobacco Use   Smoking status: Former    Packs/day: 0.25    Years: 5.00    Pack years: 1.25    Types: Cigarettes    Quit date: 5    Years since quitting: 56.1   Smokeless tobacco: Never  Vaping Use   Vaping Use: Never used  Substance and Sexual Activity   Alcohol use: Not Currently    Comment: rare one glass every 6 months   Drug use: No   Sexual activity: Yes  Other Topics Concern   Not on file  Social History Narrative   Not on file   Social Determinants of Health   Financial Resource Strain: Low Risk    Difficulty of Paying Living Expenses: Not hard at all  Food Insecurity: No Food Insecurity   Worried About Charity fundraiser in the Last Year: Never true   Ran Out of Food in the Last Year: Never true  Transportation Needs: No Transportation Needs   Lack of Transportation (Medical): No   Lack of Transportation (Non-Medical): No  Physical Activity: Insufficiently Active   Days of Exercise per Week: 2 days   Minutes of Exercise per Session: 30 min  Stress: No Stress Concern Present   Feeling of Stress : Not at all  Social Connections: Socially Integrated   Frequency of Communication with Friends and Family: Three times a week   Frequency of  Social Gatherings with Friends and Family: Three times a week   Attends Religious Services: More than 4 times per year   Active Member of Clubs or Organizations: Yes   Attends Archivist Meetings: 1 to 4 times per year   Marital Status: Married     Clinical Intake: BMI - recorded: 27.98 Nutritional Status: BMI 25 -29 Overweight Nutritional Risks: None Diabetes: Yes CBG done?: No Did pt. bring in CBG monitor from home?: No  How often  do you need to have someone help you when you read instructions, pamphlets, or other written materials from your doctor or pharmacy?: 1 - Never  Diabetic? Yes Nutrition Risk Assessment:  Has the patient had any N/V/D within the last 2 months?  No  Does the patient have any non-healing wounds?  No  Has the patient had any unintentional weight loss or weight gain?  No   Diabetes:  Is the patient diabetic?  Yes  If diabetic, was a CBG obtained today?  No  Did the patient bring in their glucometer from home?  No  How often do you monitor your CBG's? Daily.   Financial Strains and Diabetes Management:  Are you having any financial strains with the device, your supplies or your medication? No .  Does the patient want to be seen by Chronic Care Management for management of their diabetes?  No  Would the patient like to be referred to a Nutritionist or for Diabetic Management?  No   Diabetic Exams:  Diabetic Eye Exam: Completed Yes. Overdue for diabetic eye exam. Pt has been advised about the importance in completing this exam. A referral has been placed today. Message sent to referral coordinator for scheduling purposes. Advised pt to expect a call from office referred to regarding appt.  Diabetic Foot Exam: Completed Yes. Pt has been advised about the importance in completing this exam. Pt is scheduled for diabetic foot exam on Followed by PCP.   Interpreter Needed?: No  Activities of Daily Living In your present state of health, do you  have any difficulty performing the following activities: 01/15/2022 01/14/2022  Hearing? N N  Vision? N N  Comment - -  Difficulty concentrating or making decisions? N N  Walking or climbing stairs? N N  Dressing or bathing? N N  Doing errands, shopping? N N  Preparing Food and eating ? N N  Using the Toilet? N N  In the past six months, have you accidently leaked urine? N N  Do you have problems with loss of bowel control? N N  Managing your Medications? N N  Managing your Finances? N N  Housekeeping or managing your Housekeeping? N N  Some recent data might be hidden    Patient Care Team: Laurey Morale, MD as PCP - General (Family Medicine) Viona Gilmore, [facility] as Pharmacist (Pharmacist)  Indicate any recent Medical Services you may have received from other than [facility] providers in the past year (date may be approximate).     Assessment:   This is a routine wellness examination for [place].  Hearing/Vision screen Hearing Screening - Comments:: No difficulty hearing Vision Screening - Comments:: No vision difficulty  Dietary issues and exercise activities discussed: Exercise limited by: None identified   Goals Addressed               This Visit's Progress     Patient Stated (pt-stated)        I would like to stay active.        Depression Screen PHQ 2/9 Scores 01/15/2022 10/29/2021 01/30/2021 10/28/2020 10/24/2019 10/17/2018 08/30/2018  PHQ - 2 Score 0 0 0 0 0 0 0  PHQ- 9 Score - 0 - - - - -    Fall Risk Fall Risk  01/15/2022 01/14/2022 10/29/2021 01/30/2021 10/28/2020  Falls in the past year? 0 0 0 0 0  Number falls in past yr: 0 - 0 0 0  Injury with Fall? 0 - 0 0 0  Risk for fall due to : No Fall Risks - No Fall Risks No Fall Risks -  Follow up - - - Falls evaluation completed;Falls prevention discussed -    FALL RISK PREVENTION PERTAINING TO THE HOME:  Any stairs in or around the home? Yes  If so, are there any without handrails? No  Home free of loose  throw rugs in walkways, pet beds, electrical cords, etc? Yes  Adequate lighting in your home to reduce risk of falls? Yes   ASSISTIVE DEVICES UTILIZED TO PREVENT FALLS:  Life alert? No  Use of a cane, walker or w/c? No  Grab bars in the bathroom? Yes Shower chair or bench in shower? No  Elevated toilet seat or a handicapped toilet? Yes   TIMED UP AND GO:  Was the test performed? No . Audio Visit  Cognitive Function:     Immunizations Immunization History  Administered Date(s) Administered   Influenza Whole 09/05/2010   Influenza, High Dose Seasonal PF 08/20/2015, 09/08/2017, 08/19/2018, 08/28/2019, 08/28/2019   Influenza,inj,Quad PF,6+ Mos 08/14/2014   Influenza-Unspecified 09/16/2016, 09/08/2017, 09/23/2020, 09/24/2021   Moderna Covid-19 Vaccine Bivalent Booster 29yrs & up 09/01/2021   Moderna Sars-Covid-2 Vaccination 01/05/2020, 01/31/2020, 09/24/2020   Pneumococcal Conjugate-13 08/14/2014   Pneumococcal Polysaccharide-23 11/23/2007   Zoster Recombinat (Shingrix) 10/17/2018, 01/28/2019   Zoster, Live 11/19/2009    TDAP status: Up to date  Flu Vaccine status: Up to date  Pneumococcal vaccine status: Up to date  Covid-19 vaccine status: Completed vaccines  Qualifies for Shingles Vaccine? Yes   Zostavax completed Yes   Shingrix Completed?: Yes  Screening Tests Health Maintenance  Topic Date Due   OPHTHALMOLOGY EXAM  05/18/2019   FOOT EXAM  10/23/2020   TETANUS/TDAP  10/29/2022 (Originally 09/19/1960)   Hepatitis C Screening  01/15/2023 (Originally 09/19/1959)   HEMOGLOBIN A1C  04/28/2022   Pneumonia Vaccine 61+ Years old  Completed   INFLUENZA VACCINE  Completed   COVID-19 Vaccine  Completed   Zoster Vaccines- Shingrix  Completed   HPV VACCINES  Aged Out   COLONOSCOPY (Pts 45-3yrs Insurance coverage will need to be confirmed)  Discontinued    Health Maintenance  Health Maintenance Due  Topic Date Due   OPHTHALMOLOGY EXAM  05/18/2019   FOOT EXAM   10/23/2020    Colorectal cancer screening: No longer required.   Lung Cancer Screening: (Low Dose CT Chest recommended if Age 7-80 years, 30 pack-year currently smoking OR have quit w/in 15years.) does not qualify.    Additional Screening:  Hepatitis C Screening: does not qualify; Completed   Vision Screening: Recommended annual ophthalmology exams for early detection of glaucoma and other disorders of the eye. Is the patient up to date with their annual eye exam?  Yes  Who is the provider or what is the name of the office in which the patient attends annual eye exams? [facility] If pt is not established with a provider, would they like to be referred to a provider to establish care? No .   Dental Screening: Recommended annual dental exams for proper oral hygiene  Community Resource Referral / Chronic Care Management:  CRR required this visit?  No   CCM required this visit?  No      Plan:     I have personally reviewed and noted the following in the patients chart:   Medical and social history Use of alcohol, tobacco or illicit drugs  Current medications and supplements including opioid prescriptions. Patient is not currently taking opioid  prescriptions. Functional ability and status Nutritional status Physical activity Advanced directives List of other physicians Hospitalizations, surgeries, and ER visits in previous 12 months Vitals Screenings to include cognitive, depression, and falls Referrals and appointments  In addition, I have reviewed and discussed with patient certain preventive protocols, quality metrics, and best practice recommendations. A written personalized care plan for preventive services as well as general preventive health recommendations were provided to patient.     Criselda Peaches, LPN   06/26/6183   Nurse Notes: None

## 2022-01-29 ENCOUNTER — Other Ambulatory Visit: Payer: Self-pay | Admitting: Family Medicine

## 2022-01-30 ENCOUNTER — Telehealth: Payer: Self-pay | Admitting: Pharmacist

## 2022-01-30 NOTE — Chronic Care Management (AMB) (Signed)
? ? ?Chronic Care Management ?Pharmacy Assistant  ? ?Name: Troy Howard  MRN: [id number] DOB: 1941/02/18 ? ?Reason for Encounter: Disease State / Hypertension and Diabetes Assessment Call ?  ?Conditions to be addressed/monitored: ?HTN and DMII ? ? ?Recent office visits:  ?01/15/2022 Troy Arbour LPN - Medicare annual wellness exam ? ?12/29/2021 Troy Penna MD - Patient was seen for Covid virus. Started on Monlnupiravir 800 mg twice daily. Discontinued Tamsulosin. No follow up noted.  ? ?Recent consult visits:  ?12/09/2021 Troy Bruns MD (oncology) - Patient was seen for smoldering myeloma. No medication changes. Follow up in 4 months. ? ?Hospital visits:  ?None ? ?Medications: ?Outpatient Encounter Medications as of 01/30/2022  ?Medication Sig  ? aspirin-acetaminophen-caffeine (EXCEDRIN MIGRAINE) 250-250-65 MG tablet Take 1 tablet by mouth every 8 (eight) hours as needed for migraine.  ? atorvastatin (LIPITOR) 10 MG tablet Take 1 tablet (10 mg total) by mouth daily.  ? Blood Glucose Monitoring Suppl (ONE TOUCH ULTRA 2) w/Device KIT Use to check blood sugars once daily.  ? canagliflozin (INVOKANA) 300 MG TABS tablet Take 1 tablet (300 mg total) by mouth daily before breakfast.  ? CHELATED ZINC PO Take 1 capsule by mouth daily.  ? Cholecalciferol (VITAMIN D3) 2000 units capsule Take 2,000 Units by mouth daily.   ? Cyanocobalamin (VITAMIN B 12 PO) Take 2,500 mcg by mouth daily.   ? esomeprazole (NEXIUM) 40 MG capsule TAKE 1 CAPSULE BY MOUTH EVERY DAY  ? FOLIC ACID PO Take 672 mcg by mouth daily.   ? glipiZIDE (GLUCOTROL) 10 MG tablet Take 1 tablet (10 mg total) by mouth 2 (two) times daily before a meal.  ? GNP GARLIC EXTRACT PO Take 094 mg by mouth daily.  ? Levomefolate Glucosamine (METHYLFOLATE PO) Take 1 tablet by mouth daily.  ? olmesartan (BENICAR) 40 MG tablet Take 1 tablet (40 mg total) by mouth daily.  ? Omega-3 Fatty Acids (FISH OIL) 1000 MG CAPS Take by mouth daily.   ? OneTouch Delica Lancets 70J MISC  USE ONCE A DAY WHEN CHECKING BLOOD SUGAR  ? ONETOUCH VERIO test strip USE ONCE A DAY TO TEST BLOOD SUGAR  ? pyridOXINE (VITAMIN B-6) 100 MG tablet Take 100 mg by mouth daily.  ? SYNTHROID 137 MCG tablet Take 1 tablet (137 mcg total) by mouth daily before breakfast.  ? temazepam (RESTORIL) 30 MG capsule TAKE 1 CAPSULE BY MOUTH AT BEDTIME AS NEEDED FOR SLEEP  ? ?Facility-Administered Encounter Medications as of 01/30/2022  ?Medication  ? 0.9 %  sodium chloride infusion  ?Fill History: ?ATORVASTATIN CALCIUM 10MG  TABLET 11/13/2021 90  ? ?INVOKANA 300 MG TABLET 12/30/2021 30  ? ?ESOMEPRAZOLE MAG DR 40 MG CAP 10/29/2021 90  ? ?GLIPIZIDE 10 MG TABLET 10/29/2021 90  ? ?SYNTHROID 137 MCG TABLET 11/26/2021 90  ? ?OLMESARTAN MEDOXOMIL 40 MG TAB 12/07/2021 90  ? ?TEMAZEPAM 30 MG CAPSULE 01/06/2022 30  ? ?Reviewed chart prior to disease state call. Spoke with patient regarding BP ? ?Recent Office Vitals: ?BP Readings from Last 3 Encounters:  ?12/09/21 137/81  ?10/29/21 110/78  ?08/12/21 (!) 148/70  ? ?Pulse Readings from Last 3 Encounters:  ?12/09/21 84  ?10/29/21 77  ?08/12/21 63  ?  ?Wt Readings from Last 3 Encounters:  ?01/15/22 201 lb (91.2 kg)  ?12/09/21 200 lb 9.6 oz (91 kg)  ?10/29/21 200 lb (90.7 kg)  ?  ? ?Kidney Function ?Lab Results  ?Component Value Date/Time  ? CREATININE 1.83 (H) 12/02/2021 07:36 AM  ? CREATININE 1.84 (  H) 10/29/2021 08:27 AM  ? CREATININE 1.67 (H) 08/12/2021 08:56 AM  ? CREATININE 1.63 (H) 10/28/2020 01:37 PM  ? GFR 34.53 (L) 10/29/2021 08:27 AM  ? GFRNONAA 37 (L) 12/02/2021 07:36 AM  ? GFRNONAA 40 (L) 10/28/2020 01:37 PM  ? GFRAA 46 (L) 10/28/2020 01:37 PM  ? ? ?BMP Latest Ref Rng & Units 12/02/2021 10/29/2021 08/12/2021  ?Glucose 70 - 99 mg/dL 111(H) 98 117(H)  ?BUN 8 - 23 mg/dL 42(H) 44(H) 31(H)  ?Creatinine 0.61 - 1.24 mg/dL 1.83(H) 1.84(H) 1.67(H)  ?BUN/Creat Ratio 6 - 22 (calc) - - -  ?Sodium 135 - 145 mmol/L 136 135 137  ?Potassium 3.5 - 5.1 mmol/L 4.6 4.1 4.4  ?Chloride 98 - 111 mmol/L 106 105  106  ?CO2 22 - 32 mmol/L $RemoveB'23 23 22  'CkaxCIgs$ ?Calcium 8.9 - 10.3 mg/dL 9.1 9.0 9.4  ? ? ? ?Recent Relevant Labs: ?Lab Results  ?Component Value Date/Time  ? HGBA1C 6.7 (H) 10/29/2021 08:27 AM  ? HGBA1C 7.2 (H) 05/20/2021 07:01 AM  ? MICROALBUR 7.3 (H) 08/14/2014 10:11 AM  ? MICROALBUR 1.1 03/22/2012 09:32 AM  ?  ?Kidney Function ?Lab Results  ?Component Value Date/Time  ? CREATININE 1.83 (H) 12/02/2021 07:36 AM  ? CREATININE 1.84 (H) 10/29/2021 08:27 AM  ? CREATININE 1.67 (H) 08/12/2021 08:56 AM  ? CREATININE 1.63 (H) 10/28/2020 01:37 PM  ? GFR 34.53 (L) 10/29/2021 08:27 AM  ? GFRNONAA 37 (L) 12/02/2021 07:36 AM  ? GFRNONAA 40 (L) 10/28/2020 01:37 PM  ? GFRAA 46 (L) 10/28/2020 01:37 PM  ? ?Current antihypertensive regimen:  ?Olmesartan 40 mg daily ? ?How often are you checking your Blood Pressure? Patient states he is checking 3 per week ? ?Current home BP readings: Latest blood pressure was 130/78, he states they are always in this range. ? ?What recent interventions/DTPs have been made by any provider to improve Blood Pressure control since last CPP Visit: No recent interventions ? ?Any recent hospitalizations or ED visits since last visit with CPP? No recent hospital visits. ? ?What diet changes have been made to improve Blood Pressure Control?  ?Patient has cut back on sweets and breads and not eating fast food ?Breakfast - patient will have oatmeal, boiled eggs, cereal at times ?Lunch - patient will have ham or [place] lunch meat rolled up ?Dinner - patient will have a meal with meat and vegetables ? ?What exercise is being done to improve your Blood Pressure Control?  ?Patient walks 3 miles daily and plays golf ? ? ?Current antihyperglycemic regimen:  ?Invokana 300 mg daily ?Glipizide 10 mg twice daily ? ?What recent interventions/DTPs have been made to improve glycemic control:  ?No recent interventions ? ?Have there been any recent hospitalizations or ED visits since last visit with CPP? No recent hospital  visits ? ?Patient denies hypoglycemic symptoms ? ?Patient denies hyperglycemic symptoms ? ?How often are you checking your blood sugar? Patient is checking blood sugars twice daily ? ?What are your blood sugars ranging? Patients recent reading have been: ?Fasting: 87, 120, 140, 120, 116, 109, 130 ?After meals: 120, 127, 107, 131, 144, 132, 99 ? ?During the week, how often does your blood glucose drop below 70? Patient denies readings below 70 ? ?Are you checking your feet daily/regularly? Patient checks on occasion ? ?Adherence Review: ?Is the patient currently on a STATIN medication? Yes ?Is the patient currently on ACE/ARB medication? Yes ?Does the patient have >5 day gap between last estimated fill dates? No ? ? ?Care  Gaps: ?AWV - completed on 01/30/21 ?Last BP - 137/81 on 12/09/2021 ?Last A1C - 6.7 on 10/29/2022 ?Eye exam - overdue  ?Foot exam - overdue  ?  ?Star Rating Drug: ?Atorvastatin 10mg  - [facility] ?Canagliflozin 300mg  - [facility] ?Glipizide 10mg  - [facility] ?Olmesartan 40 mg - last filled [facility]07/[facility]21 [facility] ? ?[name] CMA  ?Clinical Pharmacist Assistant ?[phone] ? ?

## 2022-02-02 ENCOUNTER — Telehealth: Payer: Self-pay | Admitting: Pharmacist

## 2022-02-02 NOTE — Chronic Care Management (AMB) (Signed)
? ? ?  Chronic Care Management ?Pharmacy Assistant  ? ?Name: Troy Howard  MRN: [id number] DOB: 08-30-1941 ? ?Reason for Encounter: Reschedule appointment.  ? Rescheduled with patient to 07/14/2022 ? ?[name] CMA  ?Clinical Pharmacist Assistant ?[phone] ? ?

## 2022-02-06 NOTE — Telephone Encounter (Signed)
Awaiting for CoverMyMed to fax over some information regarding PA for Invokana ?

## 2022-02-10 NOTE — Telephone Encounter (Signed)
Awaiting a fax from pt plan for Dr Sarajane Jews to complete since pt insurance is not able to verify pt information ?

## 2022-02-25 ENCOUNTER — Other Ambulatory Visit: Payer: Self-pay | Admitting: Family Medicine

## 2022-02-27 ENCOUNTER — Other Ambulatory Visit: Payer: Self-pay | Admitting: Family Medicine

## 2022-03-06 IMAGING — DX DG CHEST 2V
2 series · 2 of 2 positions shown · non-contrast
Comparison: Chest radiograph dated 07/03/2011.

CLINICAL DATA: 79-year-old male with cough

EXAM:
CHEST - 2 VIEW

[chest pa]
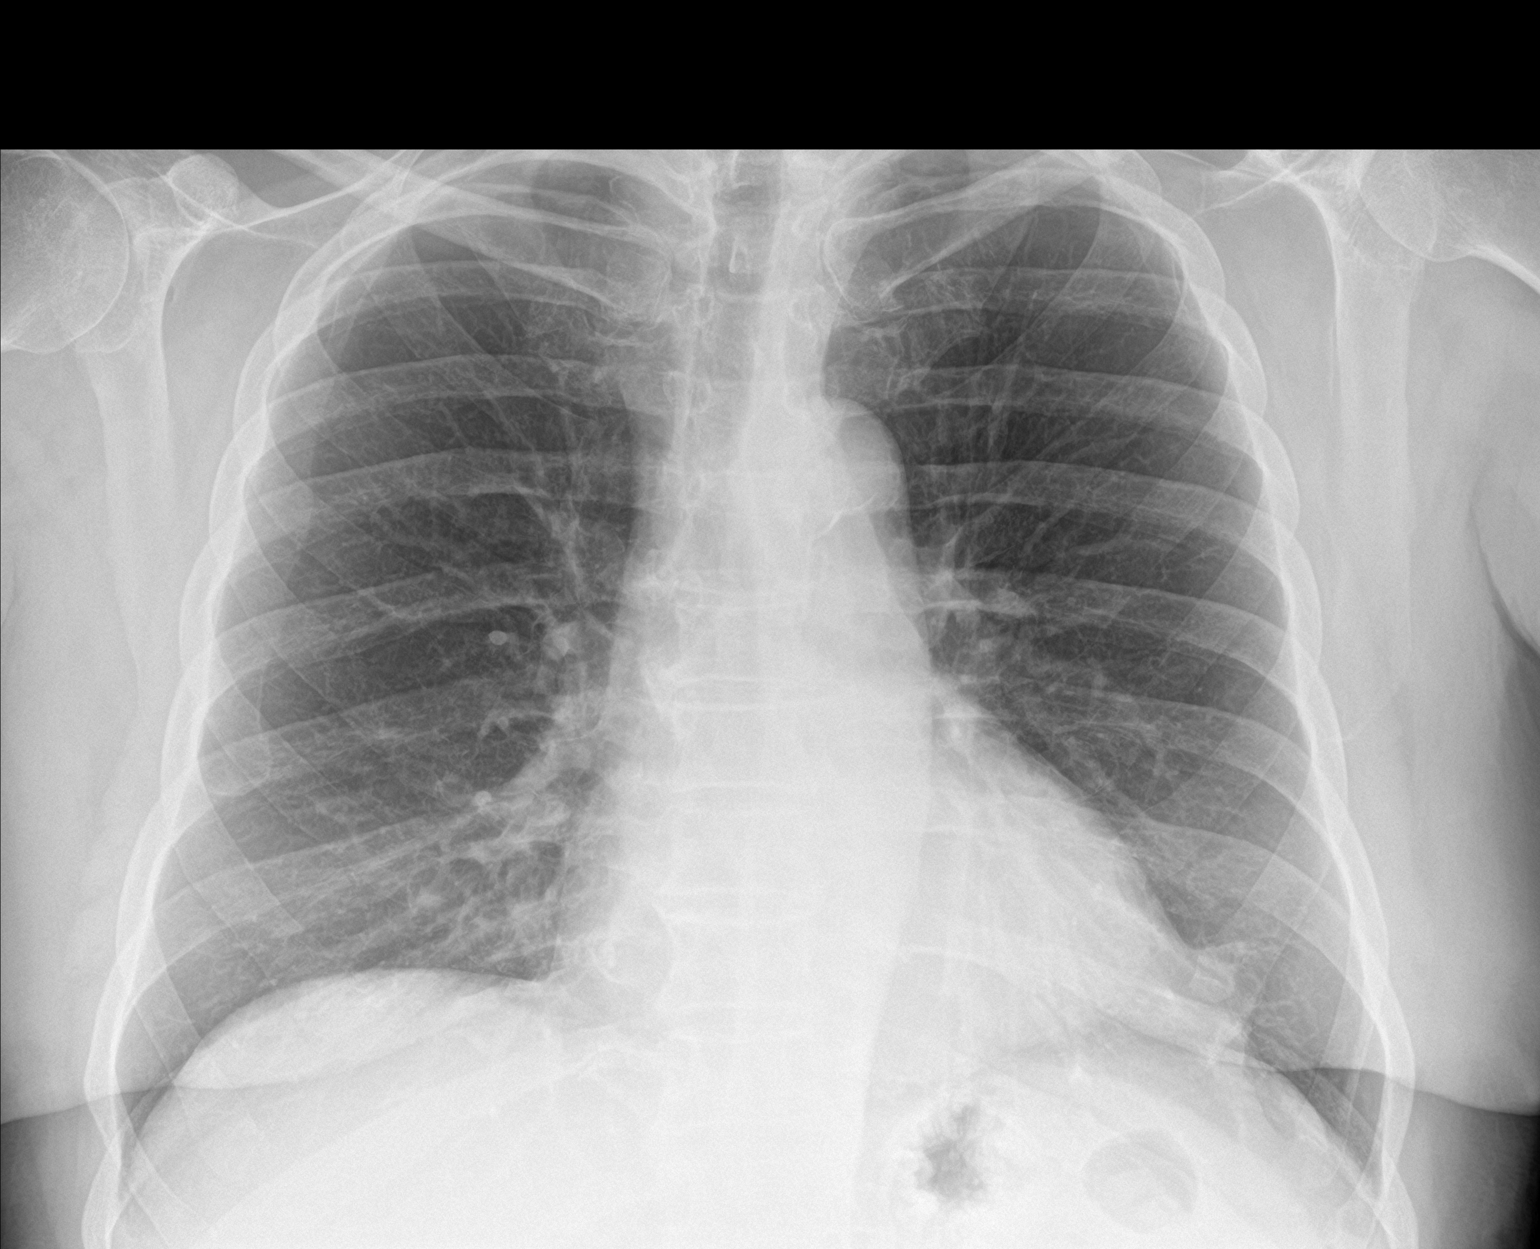

[chest lat]
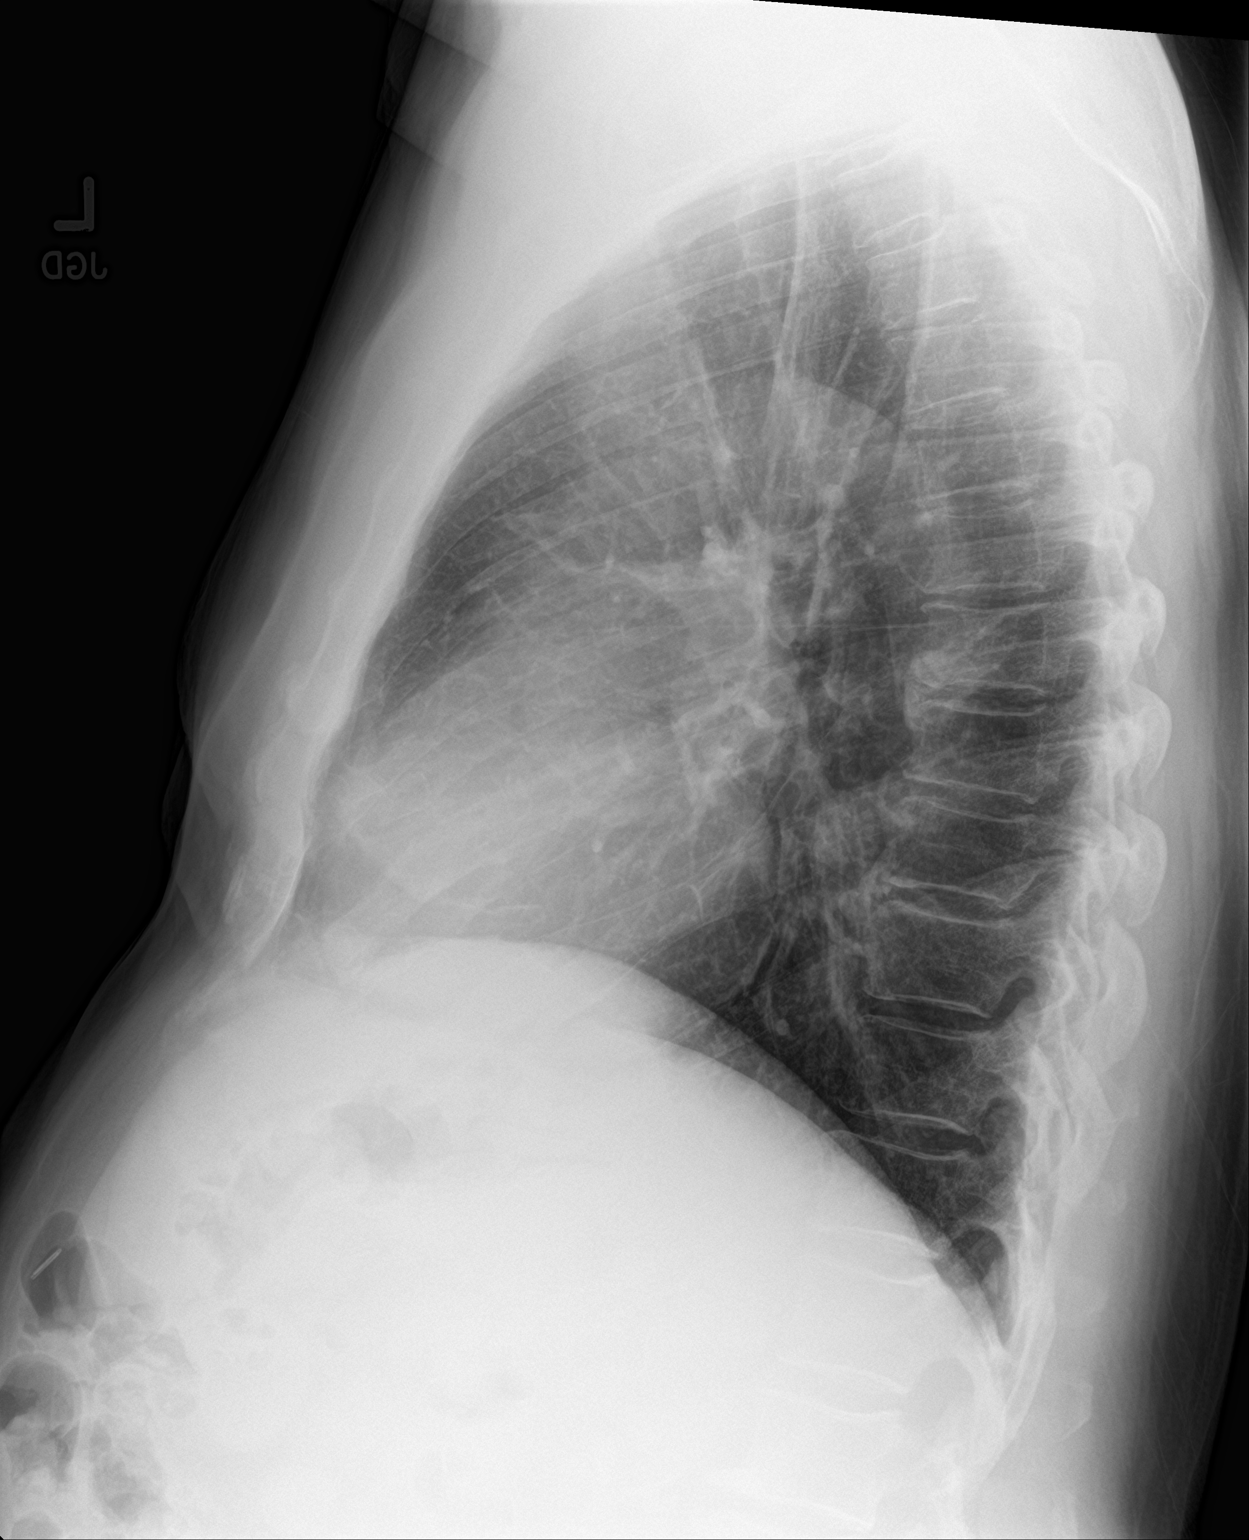

[2 of 2 positions shown; findings below may reference images not displayed]

FINDINGS: The lungs are clear. There is no pleural effusion pneumothorax. The
cardiac silhouette is within limits. Atherosclerotic calcification
of the aorta. Degenerative changes of the spine. No acute osseous
pathology.
IMPRESSION: No active cardiopulmonary disease.

## 2022-03-19 ENCOUNTER — Telehealth: Payer: Self-pay | Admitting: Hematology

## 2022-03-19 NOTE — Telephone Encounter (Signed)
Per provider reschedule called and left pt a message about appointment changes. Call back number left if changes are needed ?

## 2022-03-25 ENCOUNTER — Ambulatory Visit (HOSPITAL_COMMUNITY)
Admission: RE | Admit: 2022-03-25 | Discharge: 2022-03-25 | Disposition: A | Discharge status: Home / Self Care | Payer: PPO | Source: Ambulatory Visit | Attending: Hematology | Admitting: Hematology

## 2022-03-25 DIAGNOSIS — C9000 Multiple myeloma not having achieved remission: Secondary | ICD-10-CM | POA: Diagnosis not present

## 2022-03-25 DIAGNOSIS — D472 Monoclonal gammopathy: Secondary | ICD-10-CM | POA: Insufficient documentation

## 2022-03-25 LAB — GLUCOSE, CAPILLARY: Glucose-Capillary: 145 mg/dL — ABNORMAL HIGH (ref 70–99)

## 2022-03-25 MED ORDER — FLUDEOXYGLUCOSE F - 18 (FDG) INJECTION
10.0000 | Freq: Once | INTRAVENOUS | Status: AC | PRN
  Administered 2022-03-25: 10 via INTRAVENOUS

## 2022-04-01 ENCOUNTER — Other Ambulatory Visit: Payer: Self-pay

## 2022-04-01 ENCOUNTER — Inpatient Hospital Stay: Payer: PPO | Attending: Hematology

## 2022-04-01 DIAGNOSIS — Z79899 Other long term (current) drug therapy: Secondary | ICD-10-CM | POA: Insufficient documentation

## 2022-04-01 DIAGNOSIS — C9000 Multiple myeloma not having achieved remission: Secondary | ICD-10-CM | POA: Diagnosis not present

## 2022-04-01 DIAGNOSIS — E538 Deficiency of other specified B group vitamins: Secondary | ICD-10-CM | POA: Diagnosis not present

## 2022-04-01 DIAGNOSIS — E119 Type 2 diabetes mellitus without complications: Secondary | ICD-10-CM | POA: Diagnosis not present

## 2022-04-01 DIAGNOSIS — D472 Monoclonal gammopathy: Secondary | ICD-10-CM

## 2022-04-01 DIAGNOSIS — N183 Chronic kidney disease, stage 3 unspecified: Secondary | ICD-10-CM | POA: Insufficient documentation

## 2022-04-01 DIAGNOSIS — I10 Essential (primary) hypertension: Secondary | ICD-10-CM | POA: Diagnosis not present

## 2022-04-01 LAB — CMP (CANCER CENTER ONLY)
ALT: 13 U/L (ref 0–44)
AST: 11 U/L — ABNORMAL LOW (ref 15–41)
Albumin: 3.8 g/dL (ref 3.5–5.0)
Alkaline Phosphatase: 81 U/L (ref 38–126)
Anion gap: 6 (ref 5–15)
BUN: 32 mg/dL — ABNORMAL HIGH (ref 8–23)
CO2: 25 mmol/L (ref 22–32)
Calcium: 9.2 mg/dL (ref 8.9–10.3)
Chloride: 105 mmol/L (ref 98–111)
Creatinine: 1.6 mg/dL — ABNORMAL HIGH (ref 0.61–1.24)
GFR, Estimated: 44 mL/min — ABNORMAL LOW (ref >60)
Glucose, Bld: 147 mg/dL — ABNORMAL HIGH (ref 70–99)
Potassium: 4.4 mmol/L (ref 3.5–5.1)
Sodium: 136 mmol/L (ref 135–145)
Total Bilirubin: 0.5 mg/dL (ref 0.3–1.2)
Total Protein: 8.9 g/dL — ABNORMAL HIGH (ref 6.5–8.1)

## 2022-04-01 LAB — CBC WITH DIFFERENTIAL/PLATELET
Abs Immature Granulocytes: 0.01 10*3/uL (ref 0.00–0.07)
Basophils Absolute: 0 10*3/uL (ref 0.0–0.1)
Basophils Relative: 0 %
Eosinophils Absolute: 0 10*3/uL (ref 0.0–0.5)
Eosinophils Relative: 1 %
HCT: 34.7 % — ABNORMAL LOW (ref 39.0–52.0)
Hemoglobin: 11.7 g/dL — ABNORMAL LOW (ref 13.0–17.0)
Immature Granulocytes: 0 %
Lymphocytes Relative: 38 %
Lymphs Abs: 1.5 10*3/uL (ref 0.7–4.0)
MCH: 31 pg (ref 26.0–34.0)
MCHC: 33.7 g/dL (ref 30.0–36.0)
MCV: 92 fL (ref 80.0–100.0)
Monocytes Absolute: 0.4 10*3/uL (ref 0.1–1.0)
Monocytes Relative: 11 %
Neutro Abs: 1.9 10*3/uL (ref 1.7–7.7)
Neutrophils Relative %: 50 %
Platelets: 196 10*3/uL (ref 150–400)
RBC: 3.77 MIL/uL — ABNORMAL LOW (ref 4.22–5.81)
RDW: 13.6 % (ref 11.5–15.5)
WBC: 3.9 10*3/uL — ABNORMAL LOW (ref 4.0–10.5)
nRBC: 0 % (ref 0.0–0.2)

## 2022-04-02 LAB — KAPPA/LAMBDA LIGHT CHAINS
Kappa free light chain: 19.3 mg/L (ref 3.3–19.4)
Kappa, lambda light chain ratio: 0.28 (ref 0.26–1.65)
Lambda free light chains: 68.4 mg/L — ABNORMAL HIGH (ref 5.7–26.3)

## 2022-04-06 LAB — MULTIPLE MYELOMA PANEL, SERUM
Albumin SerPl Elph-Mcnc: 3.8 g/dL (ref 2.9–4.4)
Albumin/Glob SerPl: 0.8 (ref 0.7–1.7)
Alpha 1: 0.3 g/dL (ref 0.0–0.4)
Alpha2 Glob SerPl Elph-Mcnc: 1 g/dL (ref 0.4–1.0)
B-Globulin SerPl Elph-Mcnc: 0.9 g/dL (ref 0.7–1.3)
Gamma Glob SerPl Elph-Mcnc: 2.7 g/dL — ABNORMAL HIGH (ref 0.4–1.8)
Globulin, Total: 4.8 g/dL — ABNORMAL HIGH (ref 2.2–3.9)
IgA: 45 mg/dL — ABNORMAL LOW (ref 61–437)
IgG (Immunoglobin G), Serum: 3044 mg/dL — ABNORMAL HIGH (ref 603–1613)
IgM (Immunoglobulin M), Srm: 20 mg/dL (ref 15–143)
M Protein SerPl Elph-Mcnc: 2.4 g/dL — ABNORMAL HIGH
Total Protein ELP: 8.6 g/dL — ABNORMAL HIGH (ref 6.0–8.5)

## 2022-04-07 ENCOUNTER — Other Ambulatory Visit: Payer: Self-pay | Admitting: Family Medicine

## 2022-04-08 ENCOUNTER — Inpatient Hospital Stay (HOSPITAL_BASED_OUTPATIENT_CLINIC_OR_DEPARTMENT_OTHER): Payer: PPO | Admitting: Hematology

## 2022-04-08 ENCOUNTER — Inpatient Hospital Stay: Payer: PPO | Admitting: Hematology

## 2022-04-08 DIAGNOSIS — D472 Monoclonal gammopathy: Secondary | ICD-10-CM | POA: Diagnosis not present

## 2022-04-08 DIAGNOSIS — C9000 Multiple myeloma not having achieved remission: Secondary | ICD-10-CM | POA: Diagnosis not present

## 2022-04-09 ENCOUNTER — Telehealth: Payer: Self-pay | Admitting: Hematology

## 2022-04-09 NOTE — Telephone Encounter (Signed)
Scheduled follow-up appointments per 5/10 los. Patient is aware. ?

## 2022-04-14 NOTE — Progress Notes (Signed)
? ? ?HEMATOLOGY/ONCOLOGY PHONE VISIT NOTE ? ?Date of Service: .04/08/2022 ? ? ?Patient Care Team: ?Laurey Morale, MD as PCP - General (Family Medicine) ?Viona Gilmore, [facility] as Pharmacist (Pharmacist) ? 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 ?HISTORY OF PRESENTING ILLNESS:   ?Please see previous note for details on initial presentation ? ?Interval History:  ? ?Troy Howard is here for follow-up of his smoldering myeloma for continued evaluation and management after his last clinic visit a.I connected with [name] on .04/08/2022 at  3:30 PM EDT by telephone visit and verified that I am speaking with the correct person using two identifiers.  ? ?I discussed the limitations, risks, security and privacy concerns of performing an evaluation and management service by telemedicine and the availability of in-person appointments. I also discussed with the patient that there may be a patient responsible charge related to this service. The patient expressed understanding and agreed to proceed.  ? ?Other persons participating in the visit and their role in the encounter: patients wife  ? ?Patient?s location: home  ?Provider?s location: [facility]  ? ?Chief Complaint: Follow-up for smoldering myeloma and discussion of labs. ? ?Notes no acute new symptoms since his last clinic visit.  No new focal bone pains.  No change in energy levels.  No chest pain or shortness of breath.  No abdominal pain or distention. ?Labs done on 04/01/2022 were discussed in detail with the patient. ?PET CT scan done 03/25/2022 was also discussed in detail with the patient. ? ? ?Notes intermittent issues with low blood sugars.  Was recommended to discuss this with his primary care physician so they can optimize his diabetes management to avoid hypoglycemic episodes. ? ? ? ?MEDICAL HISTORY:  ?Past Medical History:  ?Diagnosis Date  ? Arthritis    ? back  ? Cancer [facility])   ? prostate, sees Dr. Raynelle Bring   ? Chronic kidney disease 2017  ? stage 3 kidney disease  ? Diabetes mellitus   ? Erectile dysfunction   ? GERD (gastroesophageal reflux disease)   ? Hyperlipidemia   ? Hypertension   ? Migraines   ? Peptic ulcer   ? Thyroid disease   ? ? ?SURGICAL HISTORY: ?Past Surgical History:  ?Procedure Laterality Date  ? APPENDECTOMY  1950  ? COLONOSCOPY  05/22/2021  ? per Dr. Fuller Plan, benign polyps, no repeats needed  ? HERNIA REPAIR    ? umbilical   ? MENISCUS REPAIR Left   ? PROSTATE SURGERY  2009  ? radical prostatectomy  ? SPINE SURGERY    ? L4-L5 fusion per Dr. Glenna Fellows   ? TONSILLECTOMY AND ADENOIDECTOMY  1954  ? ? ?SOCIAL HISTORY: ?Social History  ? ?Socioeconomic History  ? Marital status: Married  ?  Spouse name: Not on file  ? Number of children: Not on file  ? Years of education: Not on file  ? Highest education level: Not on file  ?Occupational History  ? Not on file  ?Tobacco Use  ? Smoking status: Former  ?  Packs/day: 0.25  ?  Years: 5.00  ?  Pack years: 1.25  ?  Types: Cigarettes  ?  Quit date: 68  ?  Years since quitting: 39.4  ? Smokeless tobacco: Never  ?Vaping Use  ? Vaping Use: Never used  ?Substance and Sexual Activity  ? Alcohol use: Not Currently  ?  Comment: rare one glass every 6 months  ?  Drug use: No  ? Sexual activity: Yes  ?Other Topics Concern  ? Not on file  ?Social History Narrative  ? Not on file  ? ?Social Determinants of Health  ? ?Financial Resource Strain: Low Risk   ? Difficulty of Paying Living Expenses: Not hard at all  ?Food Insecurity: No Food Insecurity  ? Worried About Running Out of Food in the Last Year: Never true  ? Ran Out of Food in the Last Year: Never true  ?Transportation Needs: No Transportation Needs  ? Lack of Transportation (Medical): No  ? Lack of Transportation (Non-Medical): No  ?Physical Activity: Insufficiently Active  ? Days of Exercise per Week: 2 days  ? Minutes of Exercise per Session: 30 min   ?Stress: No Stress Concern Present  ? Feeling of Stress : Not at all  ?Social Connections: Socially Integrated  ? Frequency of Communication with Friends and Family: Three times a week  ? Frequency of Social Gatherings with Friends and Family: Three times a week  ? Attends Religious Services: More than 4 times per year  ? Active Member of Clubs or Organizations: Yes  ? Attends Club or Organization Meetings: 1 to 4 times per year  ? Marital Status: Married  ?Intimate Partner Violence: Not At Risk  ? Fear of Current or Ex-Partner: No  ? Emotionally Abused: No  ? Physically Abused: No  ? Sexually Abused: No  ? ? ?FAMILY HISTORY: ?Family History  ?Problem Relation Age of Onset  ? Hypertension Other   ? Cancer Other   ? Colon cancer Neg Hx   ? Colon polyps Neg Hx   ? Esophageal cancer Neg Hx   ? Rectal cancer Neg Hx   ? Stomach cancer Neg Hx   ? ? ?ALLERGIES:  has No Known Allergies. ? ?MEDICATIONS:  ?Current Outpatient Medications  ?Medication Sig Dispense Refill  ? aspirin-acetaminophen-caffeine (EXCEDRIN MIGRAINE) 250-250-65 MG tablet Take 1 tablet by mouth every 8 (eight) hours as needed for migraine.    ? atorvastatin (LIPITOR) 10 MG tablet TAKE 1 TABLET BY MOUTH EVERY DAY 90 tablet 1  ? Blood Glucose Monitoring Suppl (ONE TOUCH ULTRA 2) w/Device KIT Use to check blood sugars once daily. 1 kit 0  ? CHELATED ZINC PO Take 1 capsule by mouth daily.    ? Cholecalciferol (VITAMIN D3) 2000 units capsule Take 2,000 Units by mouth daily.     ? Cyanocobalamin (VITAMIN B 12 PO) Take 2,500 mcg by mouth daily.     ? esomeprazole (NEXIUM) 40 MG capsule TAKE 1 CAPSULE BY MOUTH EVERY DAY 90 capsule 3  ? FOLIC ACID PO Take 800 mcg by mouth daily.     ? glipiZIDE (GLUCOTROL) 10 MG tablet Take 1 tablet (10 mg total) by mouth 2 (two) times daily before a meal. 180 tablet 3  ? GNP GARLIC EXTRACT PO Take 800 mg by mouth daily.    ? INVOKANA 300 MG TABS tablet TAKE 1 TABLET (300 MG TOTAL) BY MOUTH DAILY BEFORE BREAKFAST. 30 tablet 11  ?  Levomefolate Glucosamine (METHYLFOLATE PO) Take 1 tablet by mouth daily.    ? olmesartan (BENICAR) 40 MG tablet Take 1 tablet (40 mg total) by mouth daily. 90 tablet 3  ? Omega-3 Fatty Acids (FISH OIL) 1000 MG CAPS Take by mouth daily.     ? OneTouch Delica Lancets 30G MISC USE ONCE A DAY WHEN CHECKING BLOOD SUGAR 100 each 3  ? ONETOUCH VERIO test strip USE ONCE A DAY TO TEST   BLOOD SUGAR 100 strip 3  ? pyridOXINE (VITAMIN B-6) 100 MG tablet Take 100 mg by mouth daily.    ? SYNTHROID 137 MCG tablet Take 1 tablet (137 mcg total) by mouth daily before breakfast. 90 tablet 3  ? temazepam (RESTORIL) 30 MG capsule TAKE 1 CAPSULE BY MOUTH AT BEDTIME AS NEEDED FOR SLEEP 30 capsule 5  ? ?Current Facility-Administered Medications  ?Medication Dose Route Frequency Provider Last Rate Last Admin  ? 0.9 %  sodium chloride infusion  500 mL Intravenous Once Ladene Artist, MD      ? ? ?REVIEW OF SYSTEMS:   ?10 Point review of Systems was done is negative except as noted above. ? ?PHYSICAL EXAMINATION: ?Telemedicine visit ? ?LABORATORY DATA:  ?I have reviewed the data as listed ? ?. ? ?  Latest Ref Rng & Units 04/01/2022  ?  7:41 AM 12/02/2021  ?  7:36 AM 10/29/2021  ?  8:27 AM  ?CBC  ?WBC 4.0 - 10.5 K/uL 3.9   3.7   5.1    ?Hemoglobin 13.0 - 17.0 g/dL 11.7   12.7   12.5    ?Hematocrit 39.0 - 52.0 % 34.7   38.3   37.3    ?Platelets 150 - 400 K/uL 196   187   211.0    ? ?. ?CBC ?   ?Component Value Date/Time  ? WBC 3.9 (L) 04/01/2022 0741  ? RBC 3.77 (L) 04/01/2022 0741  ? HGB 11.7 (L) 04/01/2022 0741  ? HGB 12.7 (L) 12/02/2021 0736  ? HCT 34.7 (L) 04/01/2022 0741  ? PLT 196 04/01/2022 0741  ? PLT 187 12/02/2021 0736  ? MCV 92.0 04/01/2022 0741  ? MCH 31.0 04/01/2022 0741  ? MCHC 33.7 04/01/2022 0741  ? RDW 13.6 04/01/2022 0741  ? LYMPHSABS 1.5 04/01/2022 0741  ? MONOABS 0.4 04/01/2022 0741  ? EOSABS 0.0 04/01/2022 0741  ? BASOSABS 0.0 04/01/2022 0741  ? ? ?. ? ?  Latest Ref Rng & Units 04/01/2022  ?  7:41 AM 12/02/2021  ?  7:36 AM  10/29/2021  ?  8:27 AM  ?CMP  ?Glucose 70 - 99 mg/dL 147   111   98    ?BUN 8 - 23 mg/dL 32   42   44    ?Creatinine 0.61 - 1.24 mg/dL 1.60   1.83   1.84    ?Sodium 135 - 145 mmol/L 136   136   135    ?[place]

## 2022-05-27 ENCOUNTER — Telehealth: Payer: PPO

## 2022-06-07 ENCOUNTER — Other Ambulatory Visit: Payer: Self-pay | Admitting: Family Medicine

## 2022-06-08 NOTE — Telephone Encounter (Signed)
Last VV- 12/09/21 Last refill- 12/09/21--30 capsules, 5 refills  No future OV scheduled.

## 2022-06-15 ENCOUNTER — Telehealth: Payer: Self-pay | Admitting: Family Medicine

## 2022-06-15 NOTE — Telephone Encounter (Signed)
Pt went to the pharmacy to refill his Rx because he Is totally out of the:  temazepam (RESTORIL) 30 MG capsule  Pharmacy told him they have no record of this medication, yet Pt states he's gotten it there before.  Please advise.  [id number] [place]

## 2022-06-15 NOTE — Telephone Encounter (Signed)
[facility]   [facility]am '30mg'$ .    Called patient to inform that prescription is currently being filled at pharmacy.

## 2022-07-13 ENCOUNTER — Telehealth: Payer: Self-pay | Admitting: Pharmacist

## 2022-07-13 NOTE — Chronic Care Management (AMB) (Signed)
    Chronic Care Management Pharmacy Assistant   Name: Troy Howard  MRN: [id number] DOB: 08/17/1941  07/14/2022 APPOINTMENT REMINDER  [name] was reminded to have all medications, supplements and any blood glucose and blood pressure readings available for review with [name], Pharm. D, at his telephone visit on 07/14/2022 at 10:30.  Care Gaps: AWV - scheduled 01/19/2023 Last BP - 137/81 on 12/09/2021 Last A1C - 6.7 on 10/29/2022 Eye exam - overdue Foot exam - overdue Covid - overdue HGA1C - overdue Flu - due Tdap - postponed Hep C Screen - postoned  Star Rating Drug: Atorvastatin '10mg'$  - last filled [facility]/24/[facility] Invokana '300mg'$  - last filled [facility]30/2021 [facility] verified with Pharmacist Glipizide '10mg'$  - last filled [facility]/03/2022 [facility] Olmesartan 40 mg - last [facility]  Any gaps in medications fill history? Yes  [place] Pharmacist Assistant [phone]

## 2022-07-14 ENCOUNTER — Ambulatory Visit (INDEPENDENT_AMBULATORY_CARE_PROVIDER_SITE_OTHER): Payer: PPO | Admitting: Pharmacist

## 2022-07-14 DIAGNOSIS — I10 Essential (primary) hypertension: Secondary | ICD-10-CM

## 2022-07-14 DIAGNOSIS — E119 Type 2 diabetes mellitus without complications: Secondary | ICD-10-CM

## 2022-07-14 NOTE — Progress Notes (Signed)
Chronic Care Management Pharmacy Note  07/14/2022 Name:  Troy Howard MRN:  [id number] DOB:  [birth year]  Summary: A1c at goal < 7% but patient stopped taking Invokana Triglycerides improved but not at goal of < 150  Recommendations/Changes made from today's visit: -Recommended CGM monitoring with Freestyle libre 3 per patient request -Reached out to oncologist about Invokana -Recommend repeat lipid panel  Plan: Follow up after discussion with specialist and PCP about Invokana  Subjective: Troy Howard is an 81 y.o. year old male who is a primary patient of Laurey Morale, MD.  The CCM team was consulted for assistance with disease management and care coordination needs.    Engaged with patient by telephone for follow up visit in response to provider referral for pharmacy case management and/or care coordination services.   Consent to Services:  The patient was given information about Chronic Care Management services, agreed to services, and gave verbal consent prior to initiation of services.  Please see initial visit note for detailed documentation.   Patient Care Team: Laurey Morale, MD as PCP - General (Family Medicine) Viona Gilmore, [facility] as Pharmacist (Pharmacist)  Recent office visits: 01/15/2022 [name] LPN - Medicare annual wellness exam   12/29/2021 [name] MD - Patient was seen for Covid virus. Started on Monlnupiravir 800 mg twice daily. Discontinued Tamsulosin. No follow up noted.   Recent consult visits: 04/08/22 [name] MD (Oncology): Patient presented for smoldering myeloma follow up. No medication changes. Follow up in 4 months.   Hospital visits: None in previous 6 months  Objective:  Lab Results  Component Value Date   CREATININE 1.60 (H) 04/01/2022   BUN 32 (H) 04/01/2022   GFR 34.53 (L) 10/29/2021   GFRNONAA 44 (L) 04/01/2022   GFRAA 46 (L) 10/28/2020   NA 136 04/01/2022   K 4.4 04/01/2022   CALCIUM 9.2  04/01/2022   CO2 25 04/01/2022    Lab Results  Component Value Date/Time   HGBA1C 6.7 (H) 10/29/2021 08:27 AM   HGBA1C 7.2 (H) 05/20/2021 07:01 AM   GFR 34.53 (L) 10/29/2021 08:27 AM   GFR 47.18 (L) 10/24/2019 02:19 PM   MICROALBUR 7.3 (H) 08/14/2014 10:11 AM   MICROALBUR 1.1 03/22/2012 09:32 AM    Last diabetic Eye exam:  Lab Results  Component Value Date/Time   HMDIABEYEEXA No Retinopathy 09/16/2016 09:15 AM    Last diabetic Foot exam: No results found for: "HMDIABFOOTEX"   Lab Results  Component Value Date   CHOL 95 10/29/2021   HDL 19.50 (L) 10/29/2021   LDLCALC 44 10/29/2021   LDLDIRECT 55.0 05/20/2021   TRIG 162.0 (H) 10/29/2021   CHOLHDL 5 10/29/2021       Latest Ref Rng & Units 04/01/2022    7:41 AM 12/02/2021    7:36 AM 10/29/2021    8:27 AM  Hepatic Function  Total Protein 6.5 - 8.1 g/dL 8.9  8.9  8.3   Albumin 3.5 - 5.0 g/dL 3.8  3.7  3.8   AST 15 - 41 U/L $Remo'11  11  13   'ZsYnu$ ALT 0 - 44 U/L $Remo'13  11  14   'TUJjI$ Alk Phosphatase 38 - 126 U/L 81  75  68   Total Bilirubin 0.3 - 1.2 mg/dL 0.5  0.6  0.6   Bilirubin, Direct 0.0 - 0.3 mg/dL   0.1     Lab Results  Component Value Date/Time   TSH 0.21 (L) 10/29/2021 08:27 AM  TSH 0.52 10/28/2020 01:37 PM   FREET4 0.99 10/29/2021 08:27 AM   FREET4 1.4 10/28/2020 01:37 PM       Latest Ref Rng & Units 04/01/2022    7:41 AM 12/02/2021    7:36 AM 10/29/2021    8:27 AM  CBC  WBC 4.0 - 10.5 K/uL 3.9  3.7  5.1   Hemoglobin 13.0 - 17.0 g/dL 11.7  12.7  12.5   Hematocrit 39.0 - 52.0 % 34.7  38.3  37.3   Platelets 150 - 400 K/uL 196  187  211.0     Lab Results  Component Value Date/Time   VD25OH 99.17 02/25/2021 08:08 AM   VD25OH 23.50 (L) 02/22/2018 04:41 PM   VD25OH 67 10/22/2009 08:53 PM    Clinical ASCVD: No  The ASCVD Risk score (Arnett DK, et al., 2019) failed to calculate for the following reasons:   The systolic blood pressure is missing   The valid HDL cholesterol range is 20 to 100 mg/dL   The valid total  cholesterol range is 130 to 320 mg/dL       01/15/2022    9:03 AM 10/29/2021    8:05 AM 01/30/2021    3:36 PM  Depression screen PHQ 2/9  Decreased Interest 0 0 0  Down, Depressed, Hopeless 0 0 0  PHQ - 2 Score 0 0 0  Altered sleeping  0   Tired, decreased energy  0   Change in appetite  0   Feeling bad or failure about yourself   0   Trouble concentrating  0   Moving slowly or fidgety/restless  0   Suicidal thoughts  0   PHQ-9 Score  0   Difficult doing work/chores  Not difficult at all       Social History   Tobacco Use  Smoking Status Former   Packs/day: 0.25   Years: 5.00   Total pack years: 1.25   Types: Cigarettes   Quit date: 34   Years since quitting: 56.6  Smokeless Tobacco Never   BP Readings from Last 3 Encounters:  12/09/21 137/81  10/29/21 110/78  08/12/21 (!) 148/70   Pulse Readings from Last 3 Encounters:  12/09/21 84  10/29/21 77  08/12/21 63   Wt Readings from Last 3 Encounters:  01/15/22 201 lb (91.2 kg)  12/09/21 200 lb 9.6 oz (91 kg)  10/29/21 200 lb (90.7 kg)    Assessment/Interventions: Review of patient past medical history, allergies, medications, health status, including review of consultants reports, laboratory and other test data, was performed as part of comprehensive evaluation and provision of chronic care management services.   SDOH:  (Social Determinants of Health) assessments and interventions performed: Yes   CCM Care Plan  No Known Allergies  Medications Reviewed Today     Reviewed by Viona Gilmore, [facility] (Pharmacist) on 07/14/22 at 1047  Med List Status: <None>   Medication Order Taking? Sig Documenting Provider Last Dose Status Informant  0.9 %  sodium chloride infusion [id number]   Ladene Artist, MD  Active   aspirin-acetaminophen-caffeine [facility] MIGRAINE) [phone] MG tablet [id number]  Take 1 tablet by mouth every 8 (eight) hours as needed for migraine. [provider]  Active Self  atorvastatin  (LIPITOR) 10 MG tablet [id number]  TAKE 1 TABLET BY MOUTH EVERY DAY Laurey Morale, MD  Active   Blood Glucose Monitoring Suppl (ONE TOUCH ULTRA 2) w/Device KIT [id number]  Use to check blood sugars once daily. Laurey Morale,  MD  Active   CHELATED ZINC PO [id number]  Take 1 capsule by mouth daily. [provider]  Active Self  Cholecalciferol (VITAMIN D3) 2000 units capsule [id number]  Take 2,000 Units by mouth daily.  [provider]  Active Self  Cyanocobalamin (VITAMIN B 12 PO) [id number]  Take 2,500 mcg by mouth daily.  [provider]  Active Self  esomeprazole (NEXIUM) 40 MG capsule [id number] Yes TAKE 1 CAPSULE BY MOUTH EVERY DAY  Patient taking differently: Take 40 mg by mouth as needed. TAKE 1 CAPSULE BY MOUTH EVERY DAY   Laurey Morale, MD Taking Active   FOLIC ACID PO [id number]  Take 800 mcg by mouth daily.  [provider]  Active Self  glipiZIDE (GLUCOTROL) 10 MG tablet [id number] Yes Take 1 tablet (10 mg total) by mouth 2 (two) times daily before a meal. Laurey Morale, MD Taking Active   [facility] GARLIC EXTRACT PO [id number]  Take 800 mg by mouth daily. [provider]  Active Self  INVOKANA 300 MG TABS tablet [id number] No TAKE 1 TABLET (300 MG TOTAL) BY MOUTH DAILY BEFORE BREAKFAST.  Patient not taking: Reported on 07/14/2022   Laurey Morale, MD Not Taking Active   Levomefolate Glucosamine (METHYLFOLATE PO) [id number]  Take 1 tablet by mouth daily. [provider]  Active Self  olmesartan (BENICAR) 40 MG tablet [id number]  Take 1 tablet (40 mg total) by mouth daily. Laurey Morale, MD  Active   Omega-3 Fatty Acids (FISH OIL) 1000 MG CAPS [id number]  Take by mouth daily.  [provider]  Active Self  OneTouch Delica Lancets 75Y [place] [id number]  USE ONCE A DAY WHEN CHECKING BLOOD SUGAR Laurey Morale, MD  Active   [facility] VERIO test strip [id number]  USE ONCE A DAY TO TEST BLOOD SUGAR Laurey Morale, MD  Active   pyridOXINE (VITAMIN  B-6) 100 MG tablet [id number]  Take 100 mg by mouth daily. [provider]  Active Self  SYNTHROID 137 MCG tablet [id number] Yes Take 1 tablet (137 mcg total) by mouth daily before breakfast. 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   HYPERTENSION, BENIGN 11/23/2007   PROSTATE SPECIFIC ANTIGEN, ELEVATED 11/23/2007   MIGRAINE HEADACHE 06/13/2007    Immunization History  Administered Date(s) Administered   Influenza Whole 09/05/2010   Influenza, High Dose Seasonal PF 08/20/2015, 09/08/2017, 08/19/2018, 08/28/2019, 08/28/2019   Influenza,inj,Quad PF,6+ Mos 08/14/2014   Influenza-Unspecified 09/16/2016, 09/08/2017, 09/23/2020, 09/24/2021   Moderna Covid-19 Vaccine Bivalent Booster 66yrs & up 09/01/2021   Moderna Sars-Covid-2 Vaccination 01/05/2020, 01/31/2020, 09/24/2020   Pneumococcal Conjugate-13 08/14/2014   Pneumococcal Polysaccharide-23 11/23/2007   Zoster Recombinat (Shingrix) 10/17/2018, 01/28/2019   Zoster, Live 11/19/2009   Patient reports the only change with his medications is that he is no longer taking Invokana but he is unsure why. He said Dr. Irene Limbo took him off a few months ago  but he never reached out to our office to discuss it.  Patient is maintaining the weight loss of 40 lbs. He is still without fast foods, fried foods, ice cream and only eats sugar in his coffee in the morning. Patient reports lifestyle changes have really made a big difference for  him and he feels a lot better as well.  Patient doesn't have any acid reflux anymore. Patient does take the esomeprazole every once in a while. Patient is still on autorefill for his esomeprazole on it which is why it is still being filled.  Patient inquired about getting on another device to check his blood sugars besides the glucometer. He inquired about a device on his wrist or the sensors on his arm. Patient does have an iphone and will look for the Freestyle libre 3 app to see if his phone is compatible. His daughter is pretty techy savy so he will ask for her help with setting it up.  Conditions to be addressed/monitored:  Hypertension, Hyperlipidemia, Diabetes, GERD, Chronic Kidney Disease, Hypothyroidism and migraine, nocturia  Conditions addressed this visit: Diabetes, GERD, CKD  Care Plan : CCM Pharmacy Care Plan  Updates made by Viona Gilmore, [place] since 07/14/2022 12:00 AM     Problem: Problem: Hypertension, Hyperlipidemia, Diabetes, GERD, Chronic Kidney Disease, Hypothyroidism and migraine, nocturia      Long-Range Goal: Patient-Specific Goal   Start Date: 02/06/2021  Expected End Date: 02/06/2022  Recent Progress: On track  Priority: High  Note:   Current Barriers:  Unable to independently monitor therapeutic efficacy  Pharmacist Clinical Goal(s):  Patient will achieve adherence to monitoring guidelines and medication adherence to achieve therapeutic efficacy maintain control of diabetes as evidenced by A1c  through collaboration with PharmD and provider.   Interventions: 1:1 collaboration with Laurey Morale, MD regarding development and update of comprehensive plan of care as evidenced by provider attestation and co-signature Inter-disciplinary care team collaboration (see longitudinal plan of care) Comprehensive medication review performed; medication list updated in electronic medical record  Hypertension (BP goal <140/90) -Controlled -Current  treatment: losartan 100 mg, 1 tablet daily - Appropriate, Effective, Safe, Accessible -Medications previously tried: Benicar  -Current home readings: 120/86, 129/89, 118/79 (checking 2-3 times a week), highest he had was 139  -Current dietary habits: he has lost 35 lbs over last year and a half and has been able to cut down on fattty foods and sweets -Current exercise habits: patients states he walks 3 miles/ day -Denies hypotensive/hypertensive symptoms -Educated on Importance of home blood pressure monitoring; -Counseled to monitor BP at home weekly, document, and provide log at future appointments -Recommended to continue current medication  Hyperlipidemia: (LDL goal < 70, Triglycerides <150) -Uncontrolled TG, LDL controlled -Current treatment: Omega-3 Fatty-acids 1000mg , 2 capsules daily - Appropriate, Query effective, Safe, Accessible Atorvastatin 10 mg daily  - Appropriate, Effective, Safe, Accessible -Medications previously tried: fenofibrate -Current dietary patterns: he has cut back on sweets and smaller portion sizes in general; he is eating a lot for fruit and vegetables -Current exercise habits: patient walks 3 miles a day and is golfing on Mon and Wednesdays  -Educated on Cholesterol goals;  Importance of limiting foods high in cholesterol; Exercise goal of 150 minutes per week; -Recommended to continue current medication  Diabetes (A1c goal <7%) -Controlled -Current medications: Invokana 300 mg 1 tablet daily - not taking since January Glipizide 10mg , 1 tablet twice daily before meals - Appropriate, Query effective, Safe, Accessible -Medications previously tried: metformin (kidney function),  Januvia (kidney function) -Current home glucose readings fasting glucose: 127 (average over the last 10 days) post prandial glucose: 135 (after meals) -Denies hypoglycemic/hyperglycemic symptoms -Current meal patterns: he has cut back on sweets and smaller portion sizes in general;  he is eating a lot for fruit and vegetables; not eating fast food -Current exercise: plays golf 3 times a week 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  GERD (Goal: minimize symptoms of acid reflux/heartburn) -Controlled -Current treatment  esomeprazole 40mg , 1 capsule daily as needed - Appropriate, Effective, Safe, Accessible -Medications previously tried: none  -Recommended removing from autorefill as patient is only taking as needed.  Nocturia (Goal: minimize symptoms) -Controlled -Current treatment  No medications -Medications previously tried: tamsulsoin (ineffective) -Recommended to continue current medication  Vitamin D deficiency (Goal: vitamin D 30-100) -Uncontrolled -Current treatment  Vitamin D 2000 units daily - Appropriate, Query effective, Safe, Accessible -Medications previously tried: none  -Recommended to continue current medication Recommended repeat vitamin D level  Vitamin B12 deficiency (Goal: [phone]) -Controlled -Current treatment  Vitamin B12 2500 mcg daily - Appropriate, Effective, Safe, Accessible -Medications previously tried: none -Recommended to continue current medication  Insomnia (Goal: improve quality and quantity of sleep) -Controlled -Current treatment  Temazepam 30 mg 1 capsule at bedtime as needed - Appropriate, Effective, Query Safe, Accessible -Medications previously tried: none -Counseled on  practicing good sleep hygiene by setting a sleep schedule and maintaining it, avoid excessive napping, following a nightly routine, avoiding screen time for 30-60 minutes before going to bed, and making the bedroom a cool, quiet and dark space Patient is not taking every night  Health Maintenance -Vaccine gaps: tetanus -Current therapy:  Folic acid daily Vitamin B6 100 mg daily Zinc daily Garlic daily Excedrin Migraine- takes as needed for occasional migraine -Educated on Cost vs benefit of each product must be carefully weighed by individual consumer -Patient is satisfied with current therapy and denies issues -Recommended to continue current medication  Patient Goals/Self-Care Activities Over the next 180 days, patient will:  - take medications as prescribed check glucose daily, document, and provide at future appointments check blood pressure weekly, document, and provide at future appointments  Follow Up Plan: The care management team will reach out to the patient again over the next 14 days.       Medication Assistance: None required.  Patient affirms current coverage meets needs.  Compliance/Adherence/Medication fill history: Care Gaps: Eye exam, foot exam, tetanus, Hep C screening, COVID booster , A1c Last BP - 137/81 on 12/09/2021 Last A1C - 6.7 on 10/29/2021  Star-Rating Drugs: Atorvastatin 10mg  - last [facility]/24/2022 [facility] Invokana 300mg  - last filled 12/30/2021 [facility] verified with Pharmacist Glipizide 10mg  - last filled [facility]/03/2022 [facility] Olmesartan 40 mg - last filled [facility]/03/2022 [facility] at [facility]  Patient's preferred pharmacy is:  [id number] [place], [place] - [place] [place] [address] Phone: [phone] Fax: [phone]   Uses pill box? Yes Pt endorses 100% compliance  We discussed:  Current pharmacy is preferred with insurance plan and patient is satisfied with pharmacy services Patient decided to:  Continue current medication management strategy  Care Plan and Follow Up Patient Decision:  Patient agrees to Care Plan and Follow-up.  Plan: The care management team will reach out to the patient again over the next 14 days.  [name], PharmD [facility] Clinical Pharmacist [place] [facility] [place]

## 2022-07-14 NOTE — Patient Instructions (Signed)
Hi Troy Howard,  Glad things are going so well with you! Keep up the good work!! I will let you know what I can find out about the [place].  Please reach out to me if you have any questions or need anything!  Best, Maddie  [name], PharmD, [place]   Visit Information   Goals Addressed   None    Patient Care Plan: CCM Pharmacy Care Plan     Problem Identified: Problem: Hypertension, Hyperlipidemia, Diabetes, GERD, Chronic Kidney Disease, Hypothyroidism and migraine, nocturia      Long-Range Goal: Patient-Specific Goal   Start Date: 02/06/2021  Expected End Date: 02/06/2022  Recent Progress: On track  Priority: High  Note:   Current Barriers:  Unable to independently monitor therapeutic efficacy  Pharmacist Clinical Goal(s):  Patient will achieve adherence to monitoring guidelines and medication adherence to achieve therapeutic efficacy maintain control of diabetes as evidenced by A1c  through collaboration with PharmD and provider.   Interventions: 1:1 collaboration with Laurey Morale, MD regarding development and update of comprehensive plan of care as evidenced by provider attestation and co-signature Inter-disciplinary care team collaboration (see longitudinal plan of care) Comprehensive medication review performed; medication list updated in electronic medical record  Hypertension (BP goal <140/90) -Controlled -Current treatment: losartan 100 mg, 1 tablet daily - Appropriate, Effective, Safe, Accessible -Medications previously tried: Benicar  -Current home readings: 120/86, 129/89, 118/79 (checking 2-3 times a week), highest he had was 139  -Current dietary habits: he has lost 35 lbs over last year and a half and has been able to cut down on fattty foods and sweets -Current exercise habits: patients states he walks 3 miles/ day -Denies hypotensive/hypertensive symptoms -Educated on Importance of  home blood pressure monitoring; -Counseled to monitor BP at home weekly, document, and provide log at future appointments -Recommended to continue current medication  Hyperlipidemia: (LDL goal < 70, Triglycerides <150) -Uncontrolled TG, LDL controlled -Current treatment: Omega-3 Fatty-acids '1000mg'$ , 2 capsules daily - Appropriate, Query effective, Safe, Accessible Atorvastatin 10 mg daily  - Appropriate, Effective, Safe, Accessible -Medications previously tried: fenofibrate -Current dietary patterns: he has cut back on sweets and smaller portion sizes in general; he is eating a lot for fruit and vegetables -Current exercise habits: patient walks 3 miles a day and is golfing on Mon and Wednesdays  -Educated on Cholesterol goals;  Importance of limiting foods high in cholesterol; Exercise goal of 150 minutes per week; -Recommended to continue current medication  Diabetes (A1c goal <7%) -Controlled -Current medications: Invokana 300 mg 1 tablet daily - not taking since January Glipizide '10mg'$ , 1 tablet twice daily before meals - Appropriate, Query effective, Safe, Accessible -Medications previously tried: metformin (kidney function), Januvia (kidney function) -Current home glucose readings fasting glucose: 127 (average over the last 10 days) post prandial glucose: 135 (after meals) -Denies hypoglycemic/hyperglycemic symptoms -Current meal patterns: he has cut back on sweets and smaller portion sizes in general; he is eating a lot for fruit and vegetables; not eating fast food -Current exercise: plays golf 3 times a week 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  GERD (Goal: minimize symptoms of acid reflux/heartburn) -Controlled -Current treatment  esomeprazole '40mg'$ , 1 capsule daily as needed - Appropriate, Effective, Safe, Accessible -Medications previously tried: none  -Recommended removing from autorefill as patient is only taking as needed.  Nocturia (Goal: minimize symptoms) -Controlled -Current treatment  No medications -Medications previously tried: tamsulsoin (ineffective) -Recommended to continue current medication  Vitamin D deficiency (Goal: vitamin D 30-100) -Controlled -Current treatment  Vitamin D 2000 units daily - Appropriate, Effective, Safe, Accessible -Medications previously tried: none  -Recommended to continue current medication Recommended repeat vitamin D level  Vitamin B12 deficiency (Goal: [phone]) -Controlled -Current treatment  Vitamin B12 2500 mcg daily - Appropriate, Effective, Safe, Accessible -Medications previously tried: none -Recommended to continue current medication  Insomnia (Goal: improve quality and quantity of sleep) -Controlled -Current treatment  Temazepam 30 mg 1 capsule at bedtime as needed - Appropriate, Effective, Query Safe, Accessible -Medications previously tried: none -Counseled on practicing good sleep hygiene by setting a sleep schedule and maintaining it, avoid excessive napping, following a nightly routine, avoiding screen time for 30-60 minutes before going to bed, and making the bedroom a cool, quiet and dark space Patient is not taking every night  Health Maintenance -Vaccine gaps: tetanus -Current therapy:  Folic acid daily Vitamin B6 100 mg daily Zinc daily Garlic daily Excedrin Migraine- takes as needed for occasional migraine -Educated on Cost vs benefit of  each product must be carefully weighed by individual consumer -Patient is satisfied with current therapy and denies issues -Recommended to continue current medication  Patient Goals/Self-Care Activities Patient will:  - take medications as prescribed check glucose daily, document, and provide at future appointments check blood pressure weekly, document, and provide at future appointments  Follow Up Plan: The care management team will reach out to the patient again over the next 14 days.        Patient verbalizes understanding of instructions and care plan provided today and agrees to view in [place]. Active MyChart status and patient understanding of how to access instructions and care plan via MyChart confirmed with patient.    The pharmacy team will reach out to the patient again over the next 14 days.   Viona Gilmore, [facility]

## 2022-07-23 ENCOUNTER — Telehealth: Payer: Self-pay

## 2022-07-23 NOTE — Telephone Encounter (Signed)
Last CPE 10/29/21  Pt notified of above & appt scheduled.

## 2022-07-30 DIAGNOSIS — E039 Hypothyroidism, unspecified: Secondary | ICD-10-CM

## 2022-07-30 DIAGNOSIS — I10 Essential (primary) hypertension: Secondary | ICD-10-CM | POA: Diagnosis not present

## 2022-07-30 DIAGNOSIS — E785 Hyperlipidemia, unspecified: Secondary | ICD-10-CM

## 2022-07-30 DIAGNOSIS — E1159 Type 2 diabetes mellitus with other circulatory complications: Secondary | ICD-10-CM | POA: Diagnosis not present

## 2022-07-31 ENCOUNTER — Other Ambulatory Visit: Payer: Self-pay | Admitting: *Deleted

## 2022-07-31 DIAGNOSIS — D472 Monoclonal gammopathy: Secondary | ICD-10-CM

## 2022-08-04 ENCOUNTER — Inpatient Hospital Stay: Payer: PPO | Attending: Hematology

## 2022-08-04 ENCOUNTER — Other Ambulatory Visit: Payer: Self-pay

## 2022-08-04 DIAGNOSIS — Z79899 Other long term (current) drug therapy: Secondary | ICD-10-CM | POA: Diagnosis not present

## 2022-08-04 DIAGNOSIS — C9000 Multiple myeloma not having achieved remission: Secondary | ICD-10-CM | POA: Diagnosis not present

## 2022-08-04 DIAGNOSIS — D472 Monoclonal gammopathy: Secondary | ICD-10-CM

## 2022-08-04 LAB — CMP (CANCER CENTER ONLY)
ALT: 11 U/L (ref 0–44)
AST: 11 U/L — ABNORMAL LOW (ref 15–41)
Albumin: 3.8 g/dL (ref 3.5–5.0)
Alkaline Phosphatase: 94 U/L (ref 38–126)
Anion gap: 5 (ref 5–15)
BUN: 40 mg/dL — ABNORMAL HIGH (ref 8–23)
CO2: 24 mmol/L (ref 22–32)
Calcium: 9.6 mg/dL (ref 8.9–10.3)
Chloride: 106 mmol/L (ref 98–111)
Creatinine: 1.81 mg/dL — ABNORMAL HIGH (ref 0.61–1.24)
GFR, Estimated: 38 mL/min — ABNORMAL LOW (ref >60)
Glucose, Bld: 121 mg/dL — ABNORMAL HIGH (ref 70–99)
Potassium: 4.8 mmol/L (ref 3.5–5.1)
Sodium: 135 mmol/L (ref 135–145)
Total Bilirubin: 0.5 mg/dL (ref 0.3–1.2)
Total Protein: 9.2 g/dL — ABNORMAL HIGH (ref 6.5–8.1)

## 2022-08-04 LAB — CBC WITH DIFFERENTIAL (CANCER CENTER ONLY)
Abs Immature Granulocytes: 0.01 10*3/uL (ref 0.00–0.07)
Basophils Absolute: 0 10*3/uL (ref 0.0–0.1)
Basophils Relative: 0 %
Eosinophils Absolute: 0.1 10*3/uL (ref 0.0–0.5)
Eosinophils Relative: 2 %
HCT: 33.4 % — ABNORMAL LOW (ref 39.0–52.0)
Hemoglobin: 11.4 g/dL — ABNORMAL LOW (ref 13.0–17.0)
Immature Granulocytes: 0 %
Lymphocytes Relative: 38 %
Lymphs Abs: 1.4 10*3/uL (ref 0.7–4.0)
MCH: 31.6 pg (ref 26.0–34.0)
MCHC: 34.1 g/dL (ref 30.0–36.0)
MCV: 92.5 fL (ref 80.0–100.0)
Monocytes Absolute: 0.5 10*3/uL (ref 0.1–1.0)
Monocytes Relative: 14 %
Neutro Abs: 1.8 10*3/uL (ref 1.7–7.7)
Neutrophils Relative %: 46 %
Platelet Count: 225 10*3/uL (ref 150–400)
RBC: 3.61 MIL/uL — ABNORMAL LOW (ref 4.22–5.81)
RDW: 13.4 % (ref 11.5–15.5)
WBC Count: 3.8 10*3/uL — ABNORMAL LOW (ref 4.0–10.5)
nRBC: 0 % (ref 0.0–0.2)

## 2022-08-05 ENCOUNTER — Other Ambulatory Visit: Payer: Self-pay | Admitting: Family Medicine

## 2022-08-05 LAB — KAPPA/LAMBDA LIGHT CHAINS
Kappa free light chain: 17.1 mg/L (ref 3.3–19.4)
Kappa, lambda light chain ratio: 0.21 — ABNORMAL LOW (ref 0.26–1.65)
Lambda free light chains: 83 mg/L — ABNORMAL HIGH (ref 5.7–26.3)

## 2022-08-06 ENCOUNTER — Telehealth: Payer: Self-pay | Admitting: Pharmacist

## 2022-08-06 NOTE — Telephone Encounter (Signed)
Called patient after receiving a response from Dr. Irene Limbo about Troy Howard. Dr. Irene Limbo did not recall telling the patient to stop taking the medication. Made patient aware and he will start back with taking it. He was also unable to find the [employer] app on his phone but may have looked in the wrong place. He is going to ask his daughter to help and will follow up next week on his progress.

## 2022-08-10 LAB — MULTIPLE MYELOMA PANEL, SERUM
Albumin SerPl Elph-Mcnc: 3.5 g/dL (ref 2.9–4.4)
Albumin/Glob SerPl: 0.8 (ref 0.7–1.7)
Alpha 1: 0.3 g/dL (ref 0.0–0.4)
Alpha2 Glob SerPl Elph-Mcnc: 1 g/dL (ref 0.4–1.0)
B-Globulin SerPl Elph-Mcnc: 0.8 g/dL (ref 0.7–1.3)
Gamma Glob SerPl Elph-Mcnc: 2.6 g/dL — ABNORMAL HIGH (ref 0.4–1.8)
Globulin, Total: 4.7 g/dL — ABNORMAL HIGH (ref 2.2–3.9)
IgA: 49 mg/dL — ABNORMAL LOW (ref 61–437)
IgG (Immunoglobin G), Serum: 3640 mg/dL — ABNORMAL HIGH (ref 603–1613)
IgM (Immunoglobulin M), Srm: 22 mg/dL (ref 15–143)
M Protein SerPl Elph-Mcnc: 2.5 g/dL — ABNORMAL HIGH
Total Protein ELP: 8.2 g/dL (ref 6.0–8.5)

## 2022-08-11 ENCOUNTER — Inpatient Hospital Stay (HOSPITAL_BASED_OUTPATIENT_CLINIC_OR_DEPARTMENT_OTHER): Payer: PPO | Admitting: Hematology

## 2022-08-11 DIAGNOSIS — D472 Monoclonal gammopathy: Secondary | ICD-10-CM

## 2022-08-11 DIAGNOSIS — C9000 Multiple myeloma not having achieved remission: Secondary | ICD-10-CM | POA: Diagnosis not present

## 2022-08-11 NOTE — Progress Notes (Signed)
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   I discussed the limitations, risks, security and privacy concerns of performing an evaluation and management service by telemedicine and the availability of in-person appointments. I also discussed with the patient that there may be a patient responsible charge related to this service. The patient expressed understanding and agreed to proceed.   Other persons participating in the visit and their role in the encounter: patients wife   Patient's location: home  Provider's location: [facility]   Chief Complaint: Follow-up for smoldering myeloma and discussion of labs.  EDER MACEK is a 81 y.o. male who was contacted via phone for continued evaluation and management of multiple myeloma. He reports He is doing well with no new symptoms or concerns.  We discussed that while his M protein appears to be gradually increasing, we discussed that this is not concerning for progressive or recurrent disease and that we will continue to monitor this closely.  No fever, chills, night sweats. No new lumps, bumps, or lesions/rashes. No abdominal pain or distension. No new focal bone pains. No significant change in energy levels. No new or unexpected weight  loss. No SOB or chest pain. No other new or acute focal symptoms.  Labs done today were reviewed in detail.  MEDICAL HISTORY:  Past Medical History:  Diagnosis Date   Arthritis    back   Cancer [facility])    prostate, sees Dr. Heloise Purpura    Chronic kidney disease 2017   stage 3 kidney disease   Diabetes mellitus    Erectile dysfunction    GERD (gastroesophageal reflux disease)    Hyperlipidemia    Hypertension    Migraines    Peptic ulcer    Thyroid disease     SURGICAL HISTORY: Past Surgical History:  Procedure Laterality Date   APPENDECTOMY  1950   COLONOSCOPY  05/22/2021   per Dr. Russella Dar, benign polyps, no repeats needed   HERNIA REPAIR     umbilical    MENISCUS REPAIR Left    PROSTATE SURGERY  2009   radical prostatectomy   SPINE SURGERY     L4-L5 fusion per Dr. Trey Sailors    TONSILLECTOMY AND ADENOIDECTOMY  1954    SOCIAL HISTORY: Social History   Socioeconomic History   Marital status: Married    Spouse name: Not on file   Number of children: Not on file   Years of education: Not on file   Highest education level: Not on file  Occupational History   Not on file  Tobacco Use   Smoking status: Former    Packs/day: 0.25    Years: 5.00    Total pack years: 1.25    Types: Cigarettes    Quit date: 3    Years since quitting: 56.7   Smokeless tobacco: Never  Vaping  Use   Vaping Use: Never used  Substance and Sexual Activity   Alcohol use: Not Currently    Comment: rare one glass every 6 months   Drug use: No   Sexual activity: Yes  Other Topics Concern   Not on file  Social History Narrative   Not on file   Social Determinants of Health   Financial Resource Strain: Low Risk  (07/14/2022)   Overall Financial Resource Strain (CARDIA)    Difficulty of Paying Living Expenses: Not hard at all  Food Insecurity: No Food Insecurity (01/15/2022)   Hunger Vital Sign    Worried About Running Out of Food in the Last Year: Never true    Ran Out of Food in  the Last Year: Never true  Transportation Needs: No Transportation Needs (01/15/2022)   PRAPARE - Hydrologist (Medical): No    Lack of Transportation (Non-Medical): No  Physical Activity: Insufficiently Active (01/15/2022)   Exercise Vital Sign    Days of Exercise per Week: 2 days    Minutes of Exercise per Session: 30 min  Stress: No Stress Concern Present (01/15/2022)   [place]    Feeling of Stress : Not at all  Social Connections: [place] (01/15/2022)   Social Connection and Isolation Panel [NHANES]    Frequency of Communication with Friends and Family: Three times a week    Frequency of Social Gatherings with Friends and Family: Three times a week    Attends Religious Services: More than 4 times per year    Active Member of Clubs or Organizations: Yes    Attends Archivist Meetings: 1 to 4 times per year    Marital Status: Married  Human resources officer Violence: Not At Risk (01/15/2022)   Humiliation, Afraid, Rape, and Kick questionnaire    Fear of Current or Ex-Partner: No    Emotionally Abused: No    Physically Abused: No    Sexually Abused: No    FAMILY HISTORY: Family History  Problem Relation Age of Onset   Hypertension Other    Cancer Other    Colon cancer Neg Hx    Colon polyps Neg Hx    Esophageal cancer Neg Hx    Rectal cancer Neg Hx    Stomach cancer Neg Hx     ALLERGIES:  has No Known Allergies.  MEDICATIONS:  Current Outpatient Medications  Medication Sig Dispense Refill   temazepam (RESTORIL) 30 MG capsule TAKE 1 CAPSULE BY MOUTH AT BEDTIME AS NEEDED FOR SLEEP 30 capsule 5   aspirin-acetaminophen-caffeine (EXCEDRIN MIGRAINE) 250-250-65 MG tablet Take 1 tablet by mouth every 8 (eight) hours as needed for migraine.     atorvastatin (LIPITOR) 10 MG tablet TAKE 1 TABLET BY MOUTH EVERY DAY 90 tablet 1   Blood Glucose Monitoring Suppl (ONE TOUCH  ULTRA 2) w/Device KIT Use to check blood sugars once daily. 1 kit 0   CHELATED ZINC PO Take 1 capsule by mouth daily.     Cholecalciferol (VITAMIN D3) 2000 units capsule Take 2,000 Units by mouth daily.      Cyanocobalamin (VITAMIN B 12 PO) Take 2,500 mcg by mouth daily.      esomeprazole (NEXIUM) 40 MG capsule TAKE 1 CAPSULE BY MOUTH EVERY DAY (Patient taking differently: Take 40 mg by mouth as needed. TAKE 1 CAPSULE BY MOUTH EVERY DAY) 90 capsule 3   FOLIC ACID PO Take 637 mcg by mouth daily.  glipiZIDE (GLUCOTROL) 10 MG tablet Take 1 tablet (10 mg total) by mouth 2 (two) times daily before a meal. 180 tablet 3   GNP GARLIC EXTRACT PO Take 546 mg by mouth daily.     INVOKANA 300 MG TABS tablet TAKE 1 TABLET (300 MG TOTAL) BY MOUTH DAILY BEFORE BREAKFAST. (Patient not taking: Reported on 07/14/2022) 30 tablet 11   Levomefolate Glucosamine (METHYLFOLATE PO) Take 1 tablet by mouth daily.     olmesartan (BENICAR) 40 MG tablet TAKE 1 TABLET BY MOUTH EVERY DAY 90 tablet 1   Omega-3 Fatty Acids (FISH OIL) 1000 MG CAPS Take by mouth daily.      OneTouch Delica Lancets 27O MISC USE ONCE A DAY WHEN CHECKING BLOOD SUGAR 100 each 3   ONETOUCH VERIO test strip USE ONCE A DAY TO TEST BLOOD SUGAR 100 strip 3   pyridOXINE (VITAMIN B-6) 100 MG tablet Take 100 mg by mouth daily.     SYNTHROID 137 MCG tablet Take 1 tablet (137 mcg total) by mouth daily before breakfast. 90 tablet 3   Current Facility-Administered Medications  Medication Dose Route Frequency Provider Last Rate Last Admin   0.9 %  sodium chloride infusion  500 mL Intravenous Once Ladene Artist, MD        REVIEW OF SYSTEMS:   10 Point review of Systems was done is negative except as noted above.  PHYSICAL EXAMINATION: Telemedicine visit  LABORATORY DATA:  I have reviewed the data as listed  .    Latest Ref Rng & Units 08/04/2022    7:45 AM 04/01/2022    7:41 AM 12/02/2021    7:36 AM  CBC  WBC 4.0 - 10.5 K/uL 3.8  3.9  3.7    Hemoglobin 13.0 - 17.0 g/dL 11.4  11.7  12.7   Hematocrit 39.0 - 52.0 % 33.4  34.7  38.3   Platelets 150 - 400 K/uL 225  196  187    . CBC    Component Value Date/Time   WBC 3.8 (L) 08/04/2022 0745   WBC 3.9 (L) 04/01/2022 0741   RBC 3.61 (L) 08/04/2022 0745   HGB 11.4 (L) 08/04/2022 0745   HCT 33.4 (L) 08/04/2022 0745   PLT 225 08/04/2022 0745   MCV 92.5 08/04/2022 0745   MCH 31.6 08/04/2022 0745   MCHC 34.1 08/04/2022 0745   RDW 13.4 08/04/2022 0745   LYMPHSABS 1.4 08/04/2022 0745   MONOABS 0.5 08/04/2022 0745   EOSABS 0.1 08/04/2022 0745   BASOSABS 0.0 08/04/2022 0745    .    Latest Ref Rng & Units 08/04/2022    7:44 AM 04/01/2022    7:41 AM 12/02/2021    7:36 AM  CMP  Glucose 70 - 99 mg/dL 121  147  111   BUN 8 - 23 mg/dL 40  32  42   Creatinine 0.61 - 1.24 mg/dL 1.81  1.60  1.83   Sodium 135 - 145 mmol/L 135  136  136   Potassium 3.5 - 5.1 mmol/L 4.8  4.4  4.6   Chloride 98 - 111 mmol/L 106  105  106   CO2 22 - 32 mmol/L $RemoveB'24  25  23   'iqYGzoBZ$ Calcium 8.9 - 10.3 mg/dL 9.6  9.2  9.1   Total Protein 6.5 - 8.1 g/dL 9.2  8.9  8.9   Total Bilirubin 0.3 - 1.2 mg/dL 0.5  0.5  0.6   Alkaline Phos 38 - 126 U/L 94  81  75  AST 15 - 41 U/L $Remo'11  11  11   'JnoGQ$ ALT 0 - 44 U/L $Remo'11  13  11      'ZOvmg$ 03/27/2021 BM Bx   DIAGNOSIS:   BONE MARROW, ASPIRATE, CLOT, CORE:  -  Hypercellular bone marrow with involvement by plasma cell neoplasm  -  See comment    COMMENT:   Plasma cells are increased on aspirate smears (8% by manual differential  count) and by CD138 immunohistochemistry on the core biopsy and clot  section (15%).  Plasma cells express lambda-restricted light chains by  in situ hybridization.  Together the findings are consistent with bone  marrow involvement by a lambda-restricted plasma cell neoplasm.  Correlation with clinical impressions, radiographic studies, other  laboratory data, and cytogenetic/FISH results is recommended.   03/27/2021 Molecular Pathology    03/27/2021  Cytogenetics    RADIOGRAPHIC STUDIES: I have personally reviewed the radiological images as listed and agreed with the findings in the report. No results found.   ASSESSMENT & PLAN:   Troy Howard is a 81 y.o. caucasian male with    1. IgG Lambda smoldering myeloma He has a M-spike of 0.8g/dl and normal SFLC and ratio. No anemia - nl Hgb No focal bone pains --- Skeletal survey - shows no  No hypercalcemia. 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  05/10/18 Metastatic Bone Survey revealed no lytic lesions on skeletal survey to suggest multiple myeloma.   PLAN:  -Patient does not report any symptoms suggestive of clinical progression of smoldering myeloma to active myeloma. -Labs done today were discussed in detail. Myeloma panel shows relatively stable M spike at 2.5 g/dL Kappa lambda free light chain showed ratio of 0.21 CMP shows stable chronic kidney disease with a creatinine of 1.81 and no hypercalcemia.  Calcium 9.6 CBC shows hemoglobin of 11.7 with a WBC count of 3.8k and platelets of 225k -PET CT scan on 03/25/2022 is stable without any hypermetabolic osseous lesions or extraosseous lesions No indication to initiate treatment for active myeloma at this time We will continue active surveillance with labs every 4 months -Continue vitamin D 2000 units daily -Continue follow-up with PCP for management of other medical issues including optimization of diabetes treatment -We discussed that while his M protein appears to be gradually increasing, we discussed that this is not concerning for progressive or recurrent disease and that we will continue to monitor this closely.   2. 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 Hollie Salk  -mild non nephrotic proteinuria on 24h UPEP-Continue follow up with Dr Hollie Salk for CKD    3. Vitamin B12 deficient  Antibody testing done - no  evidence of pernicious anemia. No evidence of pernicious anemia based on neg IF and parietal cell ab -continue B12 replacement per PCP  4.  Past Medical History:  Diagnosis Date   Arthritis    back   Cancer [facility])    prostate, sees Dr. Raynelle Bring    Chronic kidney disease 2017   stage 3 kidney disease   Diabetes mellitus    Erectile dysfunction    GERD (gastroesophageal reflux disease)    Hyperlipidemia    Hypertension    Migraines    Peptic ulcer    Thyroid disease     FOLLOW UP: Phone visit with Dr Irene Limbo in 4 months Plz schedule lab appointment 1 week prior  to phone visit  The total time spent in the appointment was 15 minutes*.  All of the patient's questions were answered with apparent satisfaction. The patient knows to call the clinic with any problems, questions or concerns.   Sullivan Lone MD MS AAHIVMS [facility] Hematology/Oncology Physician [facility]  .*Total Encounter Time as defined by the Centers for Medicare and Medicaid Services includes, in addition to the face-to-face time of a patient visit (documented in the note above) non-face-to-face time: obtaining and reviewing outside history, ordering and reviewing medications, tests or procedures, care coordination (communications with other health care professionals or caregivers) and documentation in the medical record.  I, Melene Muller, am acting as scribe for Dr. Sullivan Lone, MD.  .I have reviewed the above documentation for accuracy and completeness, and I agree with the above.Marland Kitchen Marland KitchenBrunetta Genera MD

## 2022-08-15 ENCOUNTER — Other Ambulatory Visit: Payer: Self-pay | Admitting: Family Medicine

## 2022-09-01 NOTE — Telephone Encounter (Signed)
Called patient to follow up on [facility] as he still has not reached out about starting on this. 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 He requested for me to call back in a couple of months to ask again.

## 2022-09-10 DIAGNOSIS — D225 Melanocytic nevi of trunk: Secondary | ICD-10-CM | POA: Diagnosis not present

## 2022-09-10 DIAGNOSIS — L820 Inflamed seborrheic keratosis: Secondary | ICD-10-CM | POA: Diagnosis not present

## 2022-09-10 DIAGNOSIS — L570 Actinic keratosis: Secondary | ICD-10-CM | POA: Diagnosis not present

## 2022-09-10 DIAGNOSIS — D2262 Melanocytic nevi of left upper limb, including shoulder: Secondary | ICD-10-CM | POA: Diagnosis not present

## 2022-09-10 DIAGNOSIS — D2261 Melanocytic nevi of right upper limb, including shoulder: Secondary | ICD-10-CM | POA: Diagnosis not present

## 2022-09-10 DIAGNOSIS — Z85820 Personal history of malignant melanoma of skin: Secondary | ICD-10-CM | POA: Diagnosis not present

## 2022-09-10 DIAGNOSIS — L821 Other seborrheic keratosis: Secondary | ICD-10-CM | POA: Diagnosis not present

## 2022-09-15 ENCOUNTER — Other Ambulatory Visit: Payer: Self-pay | Admitting: Family Medicine

## 2022-09-15 DIAGNOSIS — Z0000 Encounter for general adult medical examination without abnormal findings: Secondary | ICD-10-CM

## 2022-10-18 ENCOUNTER — Other Ambulatory Visit: Payer: Self-pay | Admitting: Family Medicine

## 2022-10-28 ENCOUNTER — Telehealth: Payer: Self-pay | Admitting: *Deleted

## 2022-10-28 NOTE — Patient Outreach (Signed)
  Care Coordination   10/28/2022 Name: HARSHA YUSKO MRN: [id number] DOB: 1941-01-15   Care Coordination Outreach Attempts:  An unsuccessful telephone outreach was attempted today to offer the patient information about available care coordination services as a benefit of their health plan.   Follow Up Plan:  Additional outreach attempts will be made to offer the patient care coordination information and services.   Encounter Outcome:  No Answer   Care Coordination Interventions:  No, not indicated    Raina Mina, RN Care Management Coordinator [place] Office [phone]

## 2022-10-29 ENCOUNTER — Encounter: Payer: PPO | Admitting: Family Medicine

## 2022-10-30 ENCOUNTER — Encounter: Payer: Self-pay | Admitting: Family Medicine

## 2022-10-30 ENCOUNTER — Ambulatory Visit (INDEPENDENT_AMBULATORY_CARE_PROVIDER_SITE_OTHER): Payer: PPO | Admitting: Family Medicine

## 2022-10-30 VITALS — BP 122/78 | HR 71 | Temp 97.5°F | Ht 70.0 in | Wt 195.0 lb

## 2022-10-30 DIAGNOSIS — E039 Hypothyroidism, unspecified: Secondary | ICD-10-CM

## 2022-10-30 DIAGNOSIS — Z23 Encounter for immunization: Secondary | ICD-10-CM | POA: Diagnosis not present

## 2022-10-30 DIAGNOSIS — Z0000 Encounter for general adult medical examination without abnormal findings: Secondary | ICD-10-CM | POA: Diagnosis not present

## 2022-10-30 DIAGNOSIS — Z125 Encounter for screening for malignant neoplasm of prostate: Secondary | ICD-10-CM

## 2022-10-30 LAB — T3, FREE: T3, Free: 3.5 pg/mL (ref 2.3–4.2)

## 2022-10-30 LAB — HEPATIC FUNCTION PANEL
ALT: 17 U/L (ref 0–53)
AST: 13 U/L (ref 0–37)
Albumin: 3.9 g/dL (ref 3.5–5.2)
Alkaline Phosphatase: 75 U/L (ref 39–117)
Bilirubin, Direct: 0.1 mg/dL (ref 0.0–0.3)
Total Bilirubin: 0.6 mg/dL (ref 0.2–1.2)
Total Protein: 9 g/dL — ABNORMAL HIGH (ref 6.0–8.3)

## 2022-10-30 LAB — LDL CHOLESTEROL, DIRECT: Direct LDL: 51 mg/dL

## 2022-10-30 LAB — CBC WITH DIFFERENTIAL/PLATELET
Basophils Absolute: 0 10*3/uL (ref 0.0–0.1)
Basophils Relative: 0.3 % (ref 0.0–3.0)
Eosinophils Absolute: 0.1 10*3/uL (ref 0.0–0.7)
Eosinophils Relative: 2 % (ref 0.0–5.0)
HCT: 36 % — ABNORMAL LOW (ref 39.0–52.0)
Hemoglobin: 12 g/dL — ABNORMAL LOW (ref 13.0–17.0)
Lymphocytes Relative: 43.8 % (ref 12.0–46.0)
Lymphs Abs: 2.2 10*3/uL (ref 0.7–4.0)
MCHC: 33.3 g/dL (ref 30.0–36.0)
MCV: 95.5 fl (ref 78.0–100.0)
Monocytes Absolute: 0.7 10*3/uL (ref 0.1–1.0)
Monocytes Relative: 13.9 % — ABNORMAL HIGH (ref 3.0–12.0)
Neutro Abs: 2 10*3/uL (ref 1.4–7.7)
Neutrophils Relative %: 40 % — ABNORMAL LOW (ref 43.0–77.0)
Platelets: 245 10*3/uL (ref 150.0–400.0)
RBC: 3.77 Mil/uL — ABNORMAL LOW (ref 4.22–5.81)
RDW: 14.4 % (ref 11.5–15.5)
WBC: 4.9 10*3/uL (ref 4.0–10.5)

## 2022-10-30 LAB — BASIC METABOLIC PANEL
BUN: 43 mg/dL — ABNORMAL HIGH (ref 6–23)
CO2: 21 mEq/L (ref 19–32)
Calcium: 8.7 mg/dL (ref 8.4–10.5)
Chloride: 105 mEq/L (ref 96–112)
Creatinine, Ser: 1.63 mg/dL — ABNORMAL HIGH (ref 0.40–1.50)
GFR: 39.66 mL/min — ABNORMAL LOW (ref >60.00)
Glucose, Bld: 71 mg/dL (ref 70–99)
Potassium: 4.6 mEq/L (ref 3.5–5.1)
Sodium: 135 mEq/L (ref 135–145)

## 2022-10-30 LAB — TSH: TSH: 0.44 u[IU]/mL (ref 0.35–5.50)

## 2022-10-30 LAB — LIPID PANEL
Cholesterol: 101 mg/dL (ref 0–200)
HDL: 19.1 mg/dL — ABNORMAL LOW (ref >39.00)
NonHDL: 81.44
Total CHOL/HDL Ratio: 5
Triglycerides: 208 mg/dL — ABNORMAL HIGH (ref 0.0–149.0)
VLDL: 41.6 mg/dL — ABNORMAL HIGH (ref 0.0–40.0)

## 2022-10-30 LAB — HEMOGLOBIN A1C: Hgb A1c MFr Bld: 6.9 % — ABNORMAL HIGH (ref 4.6–6.5)

## 2022-10-30 LAB — T4, FREE: Free T4: 0.87 ng/dL (ref 0.60–1.60)

## 2022-10-30 LAB — PSA: PSA: 0 ng/mL — ABNORMAL LOW (ref 0.10–4.00)

## 2022-10-30 MED ORDER — OLMESARTAN MEDOXOMIL 40 MG PO TABS: 40.0000 mg | ORAL_TABLET | Freq: Every day | ORAL | 3 refills | Status: DC

## 2022-10-30 MED ORDER — GLIPIZIDE 10 MG PO TABS: 10.0000 mg | ORAL_TABLET | Freq: Two times a day (BID) | ORAL | 3 refills | Status: DC

## 2022-10-30 MED ORDER — TEMAZEPAM 30 MG PO CAPS: ORAL_CAPSULE | ORAL | 1 refills | Status: DC

## 2022-10-30 MED ORDER — ATORVASTATIN CALCIUM 10 MG PO TABS
10.0000 mg | ORAL_TABLET | Freq: Every day | ORAL | 3 refills | Status: DC
Start: 2022-10-30 — End: 2023-11-02

## 2022-10-30 MED ORDER — ESOMEPRAZOLE MAGNESIUM 40 MG PO CPDR: 40.0000 mg | DELAYED_RELEASE_CAPSULE | Freq: Every day | ORAL | 3 refills | Status: DC

## 2022-10-30 MED ORDER — CANAGLIFLOZIN 300 MG PO TABS: 300.0000 mg | ORAL_TABLET | Freq: Every day | ORAL | 3 refills | Status: DC

## 2022-10-30 MED ORDER — SYNTHROID 137 MCG PO TABS: 137.0000 ug | ORAL_TABLET | Freq: Every day | ORAL | 3 refills | Status: DC

## 2022-10-30 NOTE — Addendum Note (Signed)
Addended by: Wyvonne Lenz on: 10/30/2022 09:10 AM   Modules accepted: Orders

## 2022-10-30 NOTE — Progress Notes (Signed)
   Subjective:    Patient ID: [name], male    DOB: 1941-01-19, 81 y.o.   MRN: [id number]  HPI Here for a well exam. He feels well. He still plays golf 3-4 times a week.   Review of Systems  Constitutional: Negative.   HENT: Negative.    Eyes: Negative.   Respiratory: Negative.    Cardiovascular: Negative.   Gastrointestinal: Negative.   Genitourinary: Negative.   Musculoskeletal: Negative.   Skin: Negative.   Neurological: Negative.   Psychiatric/Behavioral: Negative.         Objective:   Physical Exam Constitutional:      General: He is not in acute distress.    Appearance: Normal appearance. He is well-developed. He is not diaphoretic.  HENT:     Head: Normocephalic and atraumatic.     Right Ear: External ear normal.     Left Ear: External ear normal.     Nose: Nose normal.     Mouth/Throat:     Pharynx: No oropharyngeal exudate.  Eyes:     General: No scleral icterus.       Right eye: No discharge.        Left eye: No discharge.     Conjunctiva/sclera: Conjunctivae normal.     Pupils: Pupils are equal, round, and reactive to light.  Neck:     Thyroid: No thyromegaly.     Vascular: No JVD.     Trachea: No tracheal deviation.  Cardiovascular:     Rate and Rhythm: Normal rate and regular rhythm.     Heart sounds: Normal heart sounds. No murmur heard.    No friction rub. No gallop.  Pulmonary:     Effort: Pulmonary effort is normal. No respiratory distress.     Breath sounds: Normal breath sounds. No wheezing or rales.  Chest:     Chest wall: No tenderness.  Abdominal:     General: Bowel sounds are normal. There is no distension.     Palpations: Abdomen is soft. There is no mass.     Tenderness: There is no abdominal tenderness. There is no guarding or rebound.  Genitourinary:    Penis: Normal. No tenderness.      Testes: Normal.  Musculoskeletal:        General: No tenderness. Normal range of motion.     Cervical back: Neck supple.   Lymphadenopathy:     Cervical: No cervical adenopathy.  Skin:    General: Skin is warm and dry.     Coloration: Skin is not pale.     Findings: No erythema or rash.  Neurological:     Mental Status: He is alert and oriented to person, place, and time.     Cranial Nerves: No cranial nerve deficit.     Motor: No abnormal muscle tone.     Coordination: Coordination normal.     Deep Tendon Reflexes: Reflexes are normal and symmetric. Reflexes normal.  Psychiatric:        Behavior: Behavior normal.        Thought Content: Thought content normal.        Judgment: Judgment normal.           Assessment & Plan:  Well exam. We discussed diet and exercise. Get fasting labs. [name], MD

## 2022-11-12 ENCOUNTER — Other Ambulatory Visit (HOSPITAL_COMMUNITY): Payer: Self-pay

## 2022-12-07 ENCOUNTER — Other Ambulatory Visit: Payer: Self-pay

## 2022-12-07 DIAGNOSIS — D472 Monoclonal gammopathy: Secondary | ICD-10-CM

## 2022-12-08 ENCOUNTER — Inpatient Hospital Stay: Payer: PPO | Attending: Hematology

## 2022-12-08 DIAGNOSIS — Z79899 Other long term (current) drug therapy: Secondary | ICD-10-CM | POA: Diagnosis not present

## 2022-12-08 DIAGNOSIS — C9000 Multiple myeloma not having achieved remission: Secondary | ICD-10-CM | POA: Diagnosis not present

## 2022-12-08 DIAGNOSIS — D472 Monoclonal gammopathy: Secondary | ICD-10-CM

## 2022-12-08 LAB — CBC WITH DIFFERENTIAL (CANCER CENTER ONLY)
Abs Immature Granulocytes: 0.01 10*3/uL (ref 0.00–0.07)
Basophils Absolute: 0 10*3/uL (ref 0.0–0.1)
Basophils Relative: 0 %
Eosinophils Absolute: 0.1 10*3/uL (ref 0.0–0.5)
Eosinophils Relative: 3 %
HCT: 33.5 % — ABNORMAL LOW (ref 39.0–52.0)
Hemoglobin: 11.6 g/dL — ABNORMAL LOW (ref 13.0–17.0)
Immature Granulocytes: 0 %
Lymphocytes Relative: 47 %
Lymphs Abs: 1.8 10*3/uL (ref 0.7–4.0)
MCH: 32.2 pg (ref 26.0–34.0)
MCHC: 34.6 g/dL (ref 30.0–36.0)
MCV: 93.1 fL (ref 80.0–100.0)
Monocytes Absolute: 0.5 10*3/uL (ref 0.1–1.0)
Monocytes Relative: 13 %
Neutro Abs: 1.4 10*3/uL — ABNORMAL LOW (ref 1.7–7.7)
Neutrophils Relative %: 37 %
Platelet Count: 200 10*3/uL (ref 150–400)
RBC: 3.6 MIL/uL — ABNORMAL LOW (ref 4.22–5.81)
RDW: 13.6 % (ref 11.5–15.5)
WBC Count: 3.9 10*3/uL — ABNORMAL LOW (ref 4.0–10.5)
nRBC: 0 % (ref 0.0–0.2)

## 2022-12-08 LAB — CMP (CANCER CENTER ONLY)
ALT: 13 U/L (ref 0–44)
AST: 11 U/L — ABNORMAL LOW (ref 15–41)
Albumin: 3.7 g/dL (ref 3.5–5.0)
Alkaline Phosphatase: 90 U/L (ref 38–126)
Anion gap: 5 (ref 5–15)
BUN: 34 mg/dL — ABNORMAL HIGH (ref 8–23)
CO2: 24 mmol/L (ref 22–32)
Calcium: 9.1 mg/dL (ref 8.9–10.3)
Chloride: 105 mmol/L (ref 98–111)
Creatinine: 1.61 mg/dL — ABNORMAL HIGH (ref 0.61–1.24)
GFR, Estimated: 43 mL/min — ABNORMAL LOW (ref >60)
Glucose, Bld: 100 mg/dL — ABNORMAL HIGH (ref 70–99)
Potassium: 4.5 mmol/L (ref 3.5–5.1)
Sodium: 134 mmol/L — ABNORMAL LOW (ref 135–145)
Total Bilirubin: 0.5 mg/dL (ref 0.3–1.2)
Total Protein: 9.3 g/dL — ABNORMAL HIGH (ref 6.5–8.1)

## 2022-12-09 LAB — KAPPA/LAMBDA LIGHT CHAINS
Kappa free light chain: 17.7 mg/L (ref 3.3–19.4)
Kappa, lambda light chain ratio: 0.2 — ABNORMAL LOW (ref 0.26–1.65)
Lambda free light chains: 86.7 mg/L — ABNORMAL HIGH (ref 5.7–26.3)

## 2022-12-10 DIAGNOSIS — E1122 Type 2 diabetes mellitus with diabetic chronic kidney disease: Secondary | ICD-10-CM | POA: Diagnosis not present

## 2022-12-10 DIAGNOSIS — N1831 Chronic kidney disease, stage 3a: Secondary | ICD-10-CM | POA: Diagnosis not present

## 2022-12-10 DIAGNOSIS — E785 Hyperlipidemia, unspecified: Secondary | ICD-10-CM | POA: Diagnosis not present

## 2022-12-10 DIAGNOSIS — I129 Hypertensive chronic kidney disease with stage 1 through stage 4 chronic kidney disease, or unspecified chronic kidney disease: Secondary | ICD-10-CM | POA: Diagnosis not present

## 2022-12-10 DIAGNOSIS — D472 Monoclonal gammopathy: Secondary | ICD-10-CM | POA: Diagnosis not present

## 2022-12-11 LAB — MULTIPLE MYELOMA PANEL, SERUM
Albumin SerPl Elph-Mcnc: 3.4 g/dL (ref 2.9–4.4)
Albumin/Glob SerPl: 0.7 (ref 0.7–1.7)
Alpha 1: 0.3 g/dL (ref 0.0–0.4)
Alpha2 Glob SerPl Elph-Mcnc: 1 g/dL (ref 0.4–1.0)
B-Globulin SerPl Elph-Mcnc: 0.9 g/dL (ref 0.7–1.3)
Gamma Glob SerPl Elph-Mcnc: 2.8 g/dL — ABNORMAL HIGH (ref 0.4–1.8)
Globulin, Total: 5 g/dL — ABNORMAL HIGH (ref 2.2–3.9)
IgA: 45 mg/dL — ABNORMAL LOW (ref 61–437)
IgG (Immunoglobin G), Serum: 3674 mg/dL — ABNORMAL HIGH (ref 603–1613)
IgM (Immunoglobulin M), Srm: 21 mg/dL (ref 15–143)
M Protein SerPl Elph-Mcnc: 2.7 g/dL — ABNORMAL HIGH
Total Protein ELP: 8.4 g/dL (ref 6.0–8.5)

## 2022-12-15 ENCOUNTER — Inpatient Hospital Stay (HOSPITAL_BASED_OUTPATIENT_CLINIC_OR_DEPARTMENT_OTHER): Payer: PPO | Admitting: Hematology

## 2022-12-15 DIAGNOSIS — C9000 Multiple myeloma not having achieved remission: Secondary | ICD-10-CM

## 2022-12-15 DIAGNOSIS — D472 Monoclonal gammopathy: Secondary | ICD-10-CM

## 2022-12-15 NOTE — Progress Notes (Signed)
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   Troy Howard is a 82 y.o. male who was contacted via phone for continued evaluation and management of multiple myeloma.   I had an phone visit with the patient on 08/11/2022 and he was doing well overall. During the last visit, his M protein was gradually increasing, but we discussed that it does not show progression or recurrence.   Patient reports he has been doing well without any new medical concerns since our last visit. He notes he had cold symptoms, which have resolved. He denies fever, chills, night sweats, bone pain, unexpected weight loss, abdominal pain, chest pain, or leg swelling.   He is complaint with all of his medications without any changes. He is regularly taking vitamin-D supplement, once daily.    He has received the influenza vaccine, COVID-19 Booster, and RSV vaccine. He is up to date with other age appropriate vaccines.   I discussed the limitations, risks, security and privacy concerns of performing an evaluation and management service by telemedicine and the availability of in-person appointments. I also discussed with the patient that there may be a patient responsible charge related to this service. The patient expressed understanding and agreed to proceed.   Other persons participating in the visit and their role in the encounter: None   Patient's location:  Home  Provider's location: [facility]   Chief Complaint: Follow-up for smoldering myeloma and discussion of labs.   MEDICAL HISTORY:  Past Medical History:  Diagnosis Date   Arthritis    back   Cancer [facility])    prostate, sees Dr. Raynelle Bring    Chronic kidney disease 2017   stage 3 kidney disease   Diabetes mellitus    Erectile dysfunction    GERD (gastroesophageal reflux disease)    Hyperlipidemia    Hypertension    Migraines    Peptic ulcer    Thyroid disease     SURGICAL HISTORY: Past Surgical History:  Procedure Laterality Date   APPENDECTOMY  1950   COLONOSCOPY  05/22/2021   per Dr. Fuller Plan, benign polyps, no repeats needed   HERNIA REPAIR     umbilical    MENISCUS REPAIR Left    PROSTATE SURGERY  2009   radical prostatectomy   SPINE SURGERY     L4-L5 fusion per Dr. Glenna Fellows    TONSILLECTOMY AND ADENOIDECTOMY  1954    SOCIAL HISTORY: Social History   Socioeconomic History   Marital status: Married    Spouse name: Not on file   Number of children: Not on file   Years of education: Not on file   Highest education level: Not on file  Occupational History   Not on file  Tobacco Use   Smoking status: Former    Packs/day: 0.25    Years: 5.00    Total pack years: 1.25    Types: Cigarettes    Quit date: 71    Years since quitting: 57.0  Smokeless tobacco: Never  Vaping Use   Vaping Use: Never used  Substance and Sexual Activity   Alcohol use: Not Currently    Comment: rare one glass every 6 months   Drug use: No   Sexual activity: Yes  Other Topics Concern   Not on file  Social History Narrative   Not on file   Social Determinants of Health   Financial Resource Strain: Low Risk  (07/14/2022)   Overall Financial Resource Strain (CARDIA)    Difficulty of Paying Living Expenses: Not hard at all  Food Insecurity: No Food Insecurity (01/15/2022)   Hunger Vital Sign    Worried About Running Out of Food in the Last Year: Never true    Ran Out of Food in  the Last Year: Never true  Transportation Needs: No Transportation Needs (01/15/2022)   PRAPARE - Hydrologist (Medical): No    Lack of Transportation (Non-Medical): No  Physical Activity: Insufficiently Active (01/15/2022)   Exercise Vital Sign    Days of Exercise per Week: 2 days    Minutes of Exercise per Session: 30 min  Stress: No Stress Concern Present (01/15/2022)   [place]    Feeling of Stress : Not at all  Social Connections: [place] (01/15/2022)   Social Connection and Isolation Panel [NHANES]    Frequency of Communication with Friends and Family: Three times a week    Frequency of Social Gatherings with Friends and Family: Three times a week    Attends Religious Services: More than 4 times per year    Active Member of Clubs or Organizations: Yes    Attends Archivist Meetings: 1 to 4 times per year    Marital Status: Married  Human resources officer Violence: Not At Risk (01/15/2022)   Humiliation, Afraid, Rape, and Kick questionnaire    Fear of Current or Ex-Partner: No    Emotionally Abused: No    Physically Abused: No    Sexually Abused: No    FAMILY HISTORY: Family History  Problem Relation Age of Onset   Hypertension Other    Cancer Other    Colon cancer Neg Hx    Colon polyps Neg Hx    Esophageal cancer Neg Hx    Rectal cancer Neg Hx    Stomach cancer Neg Hx     ALLERGIES:  has No Known Allergies.  MEDICATIONS:  Current Outpatient Medications  Medication Sig Dispense Refill   aspirin-acetaminophen-caffeine (EXCEDRIN MIGRAINE) 250-250-65 MG tablet Take 1 tablet by mouth every 8 (eight) hours as needed for migraine.     atorvastatin (LIPITOR) 10 MG tablet Take 1 tablet (10 mg total) by mouth daily. 90 tablet 3   Blood Glucose Monitoring Suppl (ONE TOUCH ULTRA 2) w/Device KIT Use to check blood sugars once daily. 1 kit 0   canagliflozin (INVOKANA)  300 MG TABS tablet Take 1 tablet (300 mg total) by mouth daily before breakfast. 90 tablet 3   CHELATED ZINC PO Take 1 capsule by mouth daily.     Cholecalciferol (VITAMIN D3) 2000 units capsule Take 2,000 Units by mouth daily.      Cyanocobalamin (VITAMIN B 12 PO) Take 2,500 mcg by mouth daily.      esomeprazole (NEXIUM) 40 MG capsule Take 1 capsule (40 mg total) by mouth daily. 90 capsule 3   FOLIC ACID PO Take 528 mcg by mouth daily.      glipiZIDE (GLUCOTROL) 10  MG tablet Take 1 tablet (10 mg total) by mouth 2 (two) times daily before a meal. 180 tablet 3   GNP GARLIC EXTRACT PO Take 008 mg by mouth daily.     Levomefolate Glucosamine (METHYLFOLATE PO) Take 1 tablet by mouth daily.     olmesartan (BENICAR) 40 MG tablet Take 1 tablet (40 mg total) by mouth daily. 90 tablet 3   Omega-3 Fatty Acids (FISH OIL) 1000 MG CAPS Take by mouth daily.      OneTouch Delica Lancets 67Y MISC USE ONCE A DAY WHEN CHECKING BLOOD SUGAR 100 each 3   ONETOUCH VERIO test strip USE ONCE A DAY TO TEST BLOOD SUGAR 100 strip 3   pyridOXINE (VITAMIN B-6) 100 MG tablet Take 100 mg by mouth daily.     SYNTHROID 137 MCG tablet Take 1 tablet (137 mcg total) by mouth daily before breakfast. 90 tablet 3   temazepam (RESTORIL) 30 MG capsule TAKE 1 CAPSULE BY MOUTH AT BEDTIME AS NEEDED FOR SLEEP 90 capsule 1   Current Facility-Administered Medications  Medication Dose Route Frequency Provider Last Rate Last Admin   0.9 %  sodium chloride infusion  500 mL Intravenous Once Ladene Artist, MD        REVIEW OF SYSTEMS:   10 Point review of Systems was done is negative except as noted above.  PHYSICAL EXAMINATION: Telemedicine visit  LABORATORY DATA:  I have reviewed the data as listed  .    Latest Ref Rng & Units 12/08/2022    8:42 AM 10/30/2022    8:59 AM 08/04/2022    7:45 AM  CBC  WBC 4.0 - 10.5 K/uL 3.9  4.9  3.8   Hemoglobin 13.0 - 17.0 g/dL 11.6  12.0  11.4   Hematocrit 39.0 - 52.0 % 33.5  36.0  33.4    Platelets 150 - 400 K/uL 200  245.0  225    . CBC    Component Value Date/Time   WBC 3.9 (L) 12/08/2022 0842   WBC 4.9 10/30/2022 0859   RBC 3.60 (L) 12/08/2022 0842   HGB 11.6 (L) 12/08/2022 0842   HCT 33.5 (L) 12/08/2022 0842   PLT 200 12/08/2022 0842   MCV 93.1 12/08/2022 0842   MCH 32.2 12/08/2022 0842   MCHC 34.6 12/08/2022 0842   RDW 13.6 12/08/2022 0842   LYMPHSABS 1.8 12/08/2022 0842   MONOABS 0.5 12/08/2022 0842   EOSABS 0.1 12/08/2022 0842   BASOSABS 0.0 12/08/2022 0842    .    Latest Ref Rng & Units 12/08/2022    8:42 AM 10/30/2022    8:59 AM 08/04/2022    7:44 AM  CMP  Glucose 70 - 99 mg/dL 100  71  121   BUN 8 - 23 mg/dL 34  43  40   Creatinine 0.61 - 1.24 mg/dL 1.61  1.63  1.81   Sodium 135 - 145 mmol/L 134  135  135   Potassium 3.5 - 5.1 mmol/L 4.5  4.6  4.8   Chloride 98 - 111 mmol/L 105  105  106   CO2 22 - 32 mmol/L '24  21  24   '$ Calcium 8.9 - 10.3 mg/dL 9.1  8.7  9.6   Total Protein 6.5 - 8.1 g/dL 9.3  9.0  9.2   Total Bilirubin 0.3 - 1.2 mg/dL 0.5  0.6  0.5   Alkaline Phos 38 - 126 U/L 90  75  94   AST 15 - 41 U/L 11  13  11   ALT 0 - 44 U/L '13  17  11      '$ 03/27/2021 BM Bx   DIAGNOSIS:   BONE MARROW, ASPIRATE, CLOT, CORE:  -  Hypercellular bone marrow with involvement by plasma cell neoplasm  -  See comment    COMMENT:   Plasma cells are increased on aspirate smears (8% by manual differential  count) and by CD138 immunohistochemistry on the core biopsy and clot  section (15%).  Plasma cells express lambda-restricted light chains by  in situ hybridization.  Together the findings are consistent with bone  marrow involvement by a lambda-restricted plasma cell neoplasm.  Correlation with clinical impressions, radiographic studies, other  laboratory data, and cytogenetic/FISH results is recommended.   03/27/2021 Molecular Pathology    03/27/2021 Cytogenetics    RADIOGRAPHIC STUDIES: I have personally reviewed the radiological images  as listed and agreed with the findings in the report. No results found.   ASSESSMENT & PLAN:   Troy Howard is a 82 y.o. caucasian male with    1. IgG Lambda smoldering myeloma He has a M-spike of 0.8g/dl and normal SFLC and ratio. No anemia - nl Hgb No focal bone pains --- Skeletal survey - shows no  No hypercalcemia. 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  05/10/18 Metastatic Bone Survey revealed no lytic lesions on skeletal survey to suggest multiple myeloma.   2. 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 Hollie Salk  -mild non nephrotic proteinuria on 24h UPEP-Continue follow up with Dr Hollie Salk for CKD    3. Vitamin B12 deficient  Antibody testing done - no evidence of pernicious anemia. No evidence of pernicious anemia based on neg IF and parietal cell ab -continue B12 replacement per PCP  4.  Past Medical History:  Diagnosis Date   Arthritis    back   Cancer [facility])    prostate, sees Dr. Raynelle Bring    Chronic kidney disease 2017   stage 3 kidney disease   Diabetes mellitus    Erectile dysfunction    GERD (gastroesophageal reflux disease)    Hyperlipidemia    Hypertension    Migraines    Peptic ulcer    Thyroid disease    PLAN:  -Discussed the lab results from 12/08/2022 with the patient. CBC shows slightly decreased hemoglobin of 11.6 and hematocrit of 33.5. CMP shows chronic kidney disease with creatinine of 1.61.  -Myeloma channel shows M protein increased to 2.8. Is slowly increasing but no other signs of progression to active myeloma. . -Patient does not report any symptoms suggestive of clinical progression of smoldering myeloma to active myeloma. No indication to initiate treatment for active myeloma at this time We will continue active surveillance with labs every 4 months -Continue vitamin D 2000 units daily  -Schedule PET scan in 14 weeks before  next visit.    FOLLOW UP: PET/CT in 14 weeks Labs in 14 weeks Phone visit with Dr Irene Limbo in 16 weeks  The total time spent in the appointment was 20 minutes* .  All of the patient's questions were answered with apparent satisfaction. The patient knows to call the clinic with any problems, questions or concerns.   Sullivan Lone MD MS AAHIVMS [facility] Hematology/Oncology Physician [facility]  .*Total Encounter Time as defined by the Centers for Medicare and Medicaid Services includes,  in addition to the face-to-face time of a patient visit (documented in the note above) non-face-to-face time: obtaining and reviewing outside history, ordering and reviewing medications, tests or procedures, care coordination (communications with other health care professionals or caregivers) and documentation in the medical record.   I, Cleda Mccreedy, am acting as a Education administrator for Sullivan Lone, MD. .I have reviewed the above documentation for accuracy and completeness, and I agree with the above. [name] MD

## 2022-12-20 ENCOUNTER — Other Ambulatory Visit: Payer: Self-pay | Admitting: Family Medicine

## 2022-12-24 ENCOUNTER — Other Ambulatory Visit: Payer: Self-pay | Admitting: Family Medicine

## 2022-12-24 NOTE — Telephone Encounter (Signed)
Pt just called to FU on this refill, stating he is completely out.  temazepam (RESTORIL) 30 MG capsule  [id number] [place] Phone: [phone] Fax: [phone]

## 2022-12-24 NOTE — Telephone Encounter (Signed)
Refill was currently on file for medication.   [facility] when ready for pick up.   Called patient informed of message.

## 2023-01-12 ENCOUNTER — Telehealth: Payer: Self-pay

## 2023-01-12 NOTE — Progress Notes (Signed)
Care Management & Coordination Services Pharmacy Team  Reason for Encounter: Diabetes  Contacted patient to discuss diabetes disease state. Spoke with patient on 01/12/2023   Current antihyperglycemic regimen:  Invokana 300 mg daily Glipizide 10 mg twice daily  Patient verbally confirms he is taking the above medications as directed. Yes  What diet changes have been made to improve diabetes control? Diet changes - patient cut back on sweets and breads and not eating fast food Breakfast - patient will have oatmeal, boiled eggs, cereal at times Lunch - patient will have ham or [place] lunch meat rolled up [place] - patient will have a meal with meat and vegetables  What recent interventions/DTPs have been made to improve glycemic control:  No recent interventions noted  Have there been any recent hospitalizations or ED visits since last visit with PharmD? No recent hospital visits   Patient denies hypoglycemic symptoms, including None  Patient denies hyperglycemic symptoms, including none  How often are you checking your blood sugar? Patient is checking blood sugars once daily   What are your blood sugars ranging?  The past 6 days blood sugar readings (starting 01/12/2022) have been 129, 116, 117, 121, 130 and 135, patient is not sure which are fasting and which aren't fasting, he states he did not notate that on his log.   During the week, how often does your blood glucose drop below 70? Patient denies any readings below 70  Are you checking your feet daily/regularly? Patient is checking his feet daily  Adherence Review: Is the patient currently on a STATIN medication? Yes Is the patient currently on ACE/ARB medication? Yes Does the patient have >5 day gap between last estimated fill dates? No   Chart Updates:  Recent office visits:  10/30/2022 [name] MD - Patient was seen for preventative health care and additional concerns. No medication changes.   Recent consult  visits:  12/15/2022 Sullivan Lone MD (oncology) - Patient was seen for Multiple myeloma not having achieved remission and an additional concern. No medication changes.   08/11/2022 Sullivan Lone MD (oncology) - Patient was seen for smoldering myeloma. No medication changes.   Hospital visits:  None  Medications: Outpatient Encounter Medications as of 01/12/2023  Medication Sig   aspirin-acetaminophen-caffeine (EXCEDRIN MIGRAINE) 250-250-65 MG tablet Take 1 tablet by mouth every 8 (eight) hours as needed for migraine.   atorvastatin (LIPITOR) 10 MG tablet Take 1 tablet (10 mg total) by mouth daily.   Blood Glucose Monitoring Suppl (ONE TOUCH ULTRA 2) w/Device KIT Use to check blood sugars once daily.   canagliflozin (INVOKANA) 300 MG TABS tablet Take 1 tablet (300 mg total) by mouth daily before breakfast.   CHELATED ZINC PO Take 1 capsule by mouth daily.   Cholecalciferol (VITAMIN D3) 2000 units capsule Take 2,000 Units by mouth daily.    Cyanocobalamin (VITAMIN B 12 PO) Take 2,500 mcg by mouth daily.    esomeprazole (NEXIUM) 40 MG capsule Take 1 capsule (40 mg total) by mouth daily.   FOLIC ACID PO Take Q000111Q mcg by mouth daily.    glipiZIDE (GLUCOTROL) 10 MG tablet Take 1 tablet (10 mg total) by mouth 2 (two) times daily before a meal.   GNP GARLIC EXTRACT PO Take [id number] mg by mouth daily.   Levomefolate Glucosamine (METHYLFOLATE PO) Take 1 tablet by mouth daily.   olmesartan (BENICAR) 40 MG tablet Take 1 tablet (40 mg total) by mouth daily.   Omega-3 Fatty Acids (FISH OIL) 1000 MG CAPS Take  by mouth daily.    OneTouch Delica Lancets 99991111 MISC USE ONCE A DAY WHEN CHECKING BLOOD SUGAR   ONETOUCH VERIO test strip USE ONCE A DAY TO TEST BLOOD SUGAR   pyridOXINE (VITAMIN B-6) 100 MG tablet Take 100 mg by mouth daily.   SYNTHROID 137 MCG tablet Take 1 tablet (137 mcg total) by mouth daily before breakfast.   temazepam (RESTORIL) 30 MG capsule TAKE 1 CAPSULE BY MOUTH AT BEDTIME AS NEEDED FOR SLEEP    Facility-Administered Encounter Medications as of 01/12/2023  Medication   0.9 %  sodium chloride infusion  Fill History:  Dispensed Days Supply Quantity Provider Pharmacy  ATORVASTATIN 10 MG TABLET 10/30/2022 90 90 each      Dispensed Days Supply Quantity Provider Pharmacy  INVOKANA 300 MG TABLET 12/30/2021 30 30 each      Dispensed Days Supply Quantity Provider Pharmacy  GLIPIZIDE 10 MG TABLET 10/30/2022 90 180 each      Dispensed Days Supply Quantity Provider Pharmacy  OLMESARTAN MEDOXOMIL 40 MG TAB 11/13/2022 90 90 each      Dispensed Days Supply Quantity Provider Pharmacy  TEMAZEPAM 30 MG CAPSULE 12/24/2022 90 90 each      Recent Relevant Labs: Lab Results  Component Value Date/Time   HGBA1C 6.9 (H) 10/30/2022 08:59 AM   HGBA1C 6.7 (H) 10/29/2021 08:27 AM   MICROALBUR 7.3 (H) 08/14/2014 10:11 AM   MICROALBUR 1.1 03/22/2012 09:32 AM    Kidney Function Lab Results  Component Value Date/Time   CREATININE 1.61 (H) 12/08/2022 08:42 AM   CREATININE 1.63 (H) 10/30/2022 08:59 AM   CREATININE 1.81 (H) 08/04/2022 07:44 AM   CREATININE 1.63 (H) 10/28/2020 01:37 PM   GFR 39.66 (L) 10/30/2022 08:59 AM   GFRNONAA 43 (L) 12/08/2022 08:42 AM   GFRNONAA 40 (L) 10/28/2020 01:37 PM   GFRAA 46 (L) 10/28/2020 01:37 PM   Star Rating Drugs:  Atorvastatin 59m - last filled [facility]30/20[facility] Invokana 3047m- last filled 12/30/2021 [facility]  Glipizide 1023m last filled 10/30/2022 [facility] Olmesartan 40 mg - last filled 11/13/2022 [facility]  Care Gaps: AWV - scheduled 01/19/2023 Last eye exam / retinopathy screening: 05/17/2018 Last diabetic foot exam: 10/24/2019 Tdap - never done Urine ACR - overdue Covid - postponed  JanLeonorearmacist Assistant [phone]

## 2023-01-13 DIAGNOSIS — N1831 Chronic kidney disease, stage 3a: Secondary | ICD-10-CM | POA: Diagnosis not present

## 2023-01-19 ENCOUNTER — Ambulatory Visit (INDEPENDENT_AMBULATORY_CARE_PROVIDER_SITE_OTHER): Payer: PPO | Admitting: Family Medicine

## 2023-01-19 DIAGNOSIS — Z0000 Encounter for general adult medical examination without abnormal findings: Secondary | ICD-10-CM | POA: Diagnosis not present

## 2023-01-19 NOTE — Patient Instructions (Addendum)
I really enjoyed getting to talk with you today! I am available on Tuesdays and Thursdays for virtual visits if you have any questions or concerns, or if I can be of any further assistance.   CHECKLIST FROM ANNUAL WELLNESS VISIT:  -Follow up (please call to schedule if not scheduled after visit):   -yearly for annual wellness visit with primary care office  Here is a list of your preventive care/health maintenance measures and the plan for each if any are due:  Health Maintenance  Topic Date Due   DTaP/Tdap/Td (1 - Tdap) Never done   Diabetic kidney evaluation - Urine ACR  08/15/2015   OPHTHALMOLOGY EXAM  05/18/2019   FOOT EXAM  11/30/2023 (Originally 10/23/2020)   COVID-19 Vaccine (5 - 2023-24 season) 11/30/2023 (Originally 07/31/2022)   HEMOGLOBIN A1C  05/01/2023   Diabetic kidney evaluation - eGFR measurement  12/09/2023   Medicare Annual Wellness (AWV)  01/20/2024   Pneumonia Vaccine 39+ Years old  Completed   INFLUENZA VACCINE  Completed   Zoster Vaccines- Shingrix  Completed   HPV VACCINES  Aged Out   COLONOSCOPY (Pts 45-73yr Insurance coverage will need to be confirmed)  Discontinued    -See a dentist at least yearly  -Get your eyes checked and then per your eye specialist's recommendations  -Other issues addressed today:   -I have included below further information regarding a healthy whole foods based diet, physical activity guidelines for adults, stress management and opportunities for social connections. I hope you find this information useful.   -----------------------------------------------------------------------------------------------------------------------------------------------------------------------------------------------------------------------------------------------------------  NUTRITION: -eat real food: lots of colorful vegetables (half the plate) and fruits -5-7 servings of vegetables and fruits per day (fresh or steamed is best), exp. 2 servings  of vegetables with lunch and dinner and 2 servings of fruit per day. Berries and greens such as kale and collards are great choices.  -consume on a regular basis: whole grains (make sure first ingredient on label contains the word "whole"), fresh fruits, fish, nuts, seeds, healthy oils (such as olive oil, avocado oil, grape seed oil) -may eat small amounts of dairy and lean meat on occasion, but avoid processed meats such as ham, bacon, lunch meat, etc. -drink water -try to avoid fast food and pre-packaged foods, processed meat -most experts advise limiting sodium to < 23011mper day, should limit further is any chronic conditions such as high blood pressure, heart disease, diabetes, etc. The American Heart Association advised that < 150075ms is ideal -try to avoid foods that contain any ingredients with names you do not recognize  -try to avoid sugar/sweets (except for the natural sugar that occurs in fresh fruit) -try to avoid sweet drinks -try to avoid white rice, white bread, pasta (unless whole grain), white or yellow potatoes  EXERCISE GUIDELINES FOR ADULTS: -if you wish to increase your physical activity, do so gradually and with the approval of your doctor -STOP and seek medical care immediately if you have any chest pain, chest discomfort or trouble breathing when starting or increasing exercise  -move and stretch your body, legs, feet and arms when sitting for long periods -Physical activity guidelines for optimal health in adults: -least 150 minutes per week of aerobic exercise (can talk, but not sing) once approved by your doctor, 20-30 minutes of sustained activity or two 10 minute episodes of sustained activity every day.  -resistance training at least 2 days per week if approved by your doctor -balance exercises 3+ days per week:   Stand somewhere where  you have something sturdy to hold onto if you lose balance.    1) lift up on toes, start with 5x per day and work up to 20x   2)  stand and lift on leg straight out to the side so that foot is a few inches of the floor, start with 5x each side and work up to 20x each side   3) stand on one foot, start with 5 seconds each side and work up to 20 seconds on each side  If you need ideas or help with getting more active:  -Silver sneakers https://tools.silversneakers.com  -Walk with a Doc: http://stephens-thompson.biz/  -try to include resistance (weight lifting/strength building) and balance exercises twice per week: or the following link for ideas: ChessContest.fr  UpdateClothing.com.cy  STRESS MANAGEMENT: -can try meditating, or just sitting quietly with deep breathing while intentionally relaxing all parts of your body for 5 minutes daily -if you need further help with stress, anxiety or depression please follow up with your primary doctor or contact the wonderful folks at [facility]: [place]: -options in [place] if you wish to engage in more social and exercise related activities:  -Silver sneakers https://tools.silversneakers.com  -Walk with a Doc: http://stephens-thompson.biz/  -Check out the [place] 50+ section on the [place] [employer] (hiking clubs, book clubs, cards and games, chess, exercise classes, aquatic classes and much more) - see the website for details: https://www.[place].gov/departments/parks-recreation/active-adults50  -YouTube has lots of exercise videos for different ages and abilities as well  -[place] (a variety of indoor and outdoor inperson activities for adults). [phone]. [address]  -Virtual Online Classes (a variety of topics): see seniorplanet.org or call [phone]  -consider volunteering at a school, hospice center, church, senior center or elsewhere

## 2023-01-19 NOTE — Progress Notes (Signed)
PATIENT CHECK-IN and HEALTH RISK ASSESSMENT QUESTIONNAIRE:  -completed by phone/video for upcoming Medicare Preventive Visit  Pre-Visit Check-in: 1)Vitals (height, wt, BP, etc) - record in vitals section for visit on day of visit 2)Review and Update Medications, Allergies PMH, Surgeries, Social history in Epic 3)Hospitalizations in the last year with date/reason? No  4)Review and Update Care Team (patient's specialists) in Epic 5) Complete PHQ9 in Epic  6) Complete Fall Screening in Epic 7)Review all Health Maintenance Due and order under PCP if not done.  Medicare Wellness Patient Questionnaire:  Answer theses question about your habits: Do you drink alcohol? No    If yes, how many drinks do you have a day? No  Have you ever smoked?Yes    Quit date if applicable?  40 years ago How many packs a day do/did you smoke? few cigarettes  Do you use smokeless tobacco?No Do you use an illicit drugs?No Do you exercises? Yes   IF so, what type and how many days/minutes per week?play golf in nice weather a few times per weeks - used to walk a lot at at work Are you sexually active? No Number of partners?  0 He eats a lot of fresh fruit, also eats lots of fresh fruits Typical breakfast: eggs, toast, cereal  Typical lunch: ham and cheese Sandwich Typical dinner: sometimes a sandwich Typical snacks:fruits,cheese  Beverages: water, milk, ice flavored water drink  Answer theses question about you: Can you perform most household chores?Yes Do you find it hard to follow a conversation in a noisy room?No Do you often ask people to speak up or repeat themselves?No Do you feel that you have a problem with memory?No Do you balance your checkbook and or bank acounts?Yes Do you feel safe at home?Yes Last dentist visit?4 months ago Do you need assistance with any of the following: Please note if so No assistance needed with below  Driving?  Feeding yourself?  Getting from bed to chair?  Getting to  the toilet?  Bathing or showering?  Dressing yourself?  Managing money?  Climbing a flight of stairs  Preparing meals?    Do you have Advanced Directives in place (Living Will, Healthcare Power or Attorney)? Yes   Last eye Exam and location?Next appointment 01/2023. Dr. Marica Otter.   [place] you currently use prescribed or non-prescribed narcotic or opioid pain medications?Tylenol migraine sometimes, Tylenol  Do you have a history or close family history of breast, ovarian, tubal or peritoneal cancer or a family member with BRCA (breast cancer susceptibility 1 and 2) gene mutations?  Nurse/Assistant Credentials/time stamp:   ----------------------------------------------------------------------------------------------------------------------------------------------------------------------------------------------------------------------    MEDICARE ANNUAL PREVENTIVE CARE VISIT WITH PROVIDER (Welcome to [employer], initial annual wellness or annual wellness exam)  Virtual Visit via Phone Note  I connected with Troy Howard on 01/19/23  by phone and verified that I am speaking with the correct person using two identifiers.  Location patient: home Location provider:work or home office Persons participating in the virtual visit: patient, provider  Concerns and/or follow up today: none, stable   See HM section in Epic for other details of completed HM.    ROS: negative for report of fevers, unintentional weight loss, vision changes, vision loss, hearing loss or change, chest pain, sob, hemoptysis, melena, hematochezia, hematuria, genital discharge or lesions, falls, bleeding or bruising, loc, thoughts of suicide or self harm, memory loss  Patient-completed extensive health risk assessment - reviewed and discussed with the patient: See Health  Risk Assessment completed with patient prior to the visit either above or in recent phone note. This was  reviewed in detailed with the patient today and appropriate recommendations, orders and referrals were placed as needed per Summary below and patient instructions.   Review of Medical History: -PMH, PSH, Family History and current specialty and care providers reviewed and updated and listed below   Patient Care Team: Laurey Morale, MD as PCP - General (Family Medicine) Viona Gilmore, [facility] (Inactive) as Pharmacist (Pharmacist)   Past Medical History:  Diagnosis Date   Arthritis    back   Cancer [facility])    prostate, sees Dr. Raynelle Bring    Chronic kidney disease 2017   stage 3 kidney disease   Diabetes mellitus    Erectile dysfunction    GERD (gastroesophageal reflux disease)    Hyperlipidemia    Hypertension    Migraines    Peptic ulcer    Thyroid disease     Past Surgical History:  Procedure Laterality Date   APPENDECTOMY  1950   COLONOSCOPY  05/22/2021   per Dr. Fuller Plan, benign polyps, no repeats needed   HERNIA REPAIR     umbilical    MENISCUS REPAIR Left    PROSTATE SURGERY  2009   radical prostatectomy   SPINE SURGERY     L4-L5 fusion per Dr. Glenna Fellows    TONSILLECTOMY AND ADENOIDECTOMY  1954    Social History   Socioeconomic History   Marital status: Married    Spouse name: Not on file   Number of children: Not on file   Years of education: Not on file   Highest education level: Not on file  Occupational History   Not on file  Tobacco Use   Smoking status: Former    Packs/day: 0.25    Years: 5.00    Total pack years: 1.25    Types: Cigarettes    Quit date: 82    Years since quitting: 57.1   Smokeless tobacco: Never  Vaping Use   Vaping Use: Never used  Substance and Sexual Activity   Alcohol use: Not Currently    Comment: rare one glass every 6 months   Drug use: No   Sexual activity: Yes  Other Topics Concern   Not on file  Social History Narrative   Not on file   Social Determinants of Health   Financial Resource Strain: Low Risk   (07/14/2022)   Overall Financial Resource Strain (CARDIA)    Difficulty of Paying Living Expenses: Not hard at all  Food Insecurity: No Food Insecurity (01/15/2022)   Hunger Vital Sign    Worried About Running Out of Food in the Last Year: Never true    [place] in the Last Year: Never true  Transportation Needs: No Transportation Needs (01/15/2022)   PRAPARE - Hydrologist (Medical): No    Lack of Transportation (Non-Medical): No  Physical Activity: Insufficiently Active (01/15/2022)   Exercise Vital Sign    Days of Exercise per Week: 2 days    Minutes of Exercise per Session: 30 min  Stress: No Stress Concern Present (01/15/2022)   [place]    Feeling of Stress : Not at all  Social Connections: [place] (01/15/2022)   Social Connection and Isolation Panel [NHANES]    Frequency of Communication with Friends and Family: Three times a week    Frequency of Social  Gatherings with Friends and Family: Three times a week    Attends Religious Services: More than 4 times per year    Active Member of Clubs or Organizations: Yes    Attends Archivist Meetings: 1 to 4 times per year    Marital Status: Married  Human resources officer Violence: Not At Risk (01/15/2022)   Humiliation, Afraid, Rape, and Kick questionnaire    Fear of Current or Ex-Partner: No    Emotionally Abused: No    Physically Abused: No    Sexually Abused: No    Family History  Problem Relation Age of Onset   Hypertension Other    Cancer Other    Colon cancer Neg Hx    Colon polyps Neg Hx    Esophageal cancer Neg Hx    Rectal cancer Neg Hx    Stomach cancer Neg Hx     Current Outpatient Medications on File Prior to Visit  Medication Sig Dispense Refill   aspirin-acetaminophen-caffeine (EXCEDRIN MIGRAINE) 250-250-65 MG tablet Take 1 tablet by mouth every 8 (eight) hours as needed for migraine.      atorvastatin (LIPITOR) 10 MG tablet Take 1 tablet (10 mg total) by mouth daily. 90 tablet 3   Blood Glucose Monitoring Suppl (ONE TOUCH ULTRA 2) w/Device KIT Use to check blood sugars once daily. 1 kit 0   canagliflozin (INVOKANA) 300 MG TABS tablet Take 1 tablet (300 mg total) by mouth daily before breakfast. 90 tablet 3   CHELATED ZINC PO Take 1 capsule by mouth daily.     Cholecalciferol (VITAMIN D3) 2000 units capsule Take 2,000 Units by mouth daily.      Cyanocobalamin (VITAMIN B 12 PO) Take 2,500 mcg by mouth daily.      esomeprazole (NEXIUM) 40 MG capsule Take 1 capsule (40 mg total) by mouth daily. 90 capsule 3   FOLIC ACID PO Take Q000111Q mcg by mouth daily.      glipiZIDE (GLUCOTROL) 10 MG tablet Take 1 tablet (10 mg total) by mouth 2 (two) times daily before a meal. 180 tablet 3   GNP GARLIC EXTRACT PO Take Q000111Q mg by mouth daily.     Levomefolate Glucosamine (METHYLFOLATE PO) Take 1 tablet by mouth daily.     olmesartan (BENICAR) 40 MG tablet Take 1 tablet (40 mg total) by mouth daily. 90 tablet 3   Omega-3 Fatty Acids (FISH OIL) 1000 MG CAPS Take by mouth daily.      OneTouch Delica Lancets 99991111 MISC USE ONCE A DAY WHEN CHECKING BLOOD SUGAR 100 each 3   ONETOUCH VERIO test strip USE ONCE A DAY TO TEST BLOOD SUGAR 100 strip 3   pyridOXINE (VITAMIN B-6) 100 MG tablet Take 100 mg by mouth daily.     SYNTHROID 137 MCG tablet Take 1 tablet (137 mcg total) by mouth daily before breakfast. 90 tablet 3   temazepam (RESTORIL) 30 MG capsule TAKE 1 CAPSULE BY MOUTH AT BEDTIME AS NEEDED FOR SLEEP 90 capsule 1   Current Facility-Administered Medications on File Prior to Visit  Medication Dose Route Frequency Provider Last Rate Last Admin   0.9 %  sodium chloride infusion  500 mL Intravenous Once Ladene Artist, MD        No Known Allergies     Physical Exam There were no vitals filed for this visit. Estimated body mass index is 27.98 kg/m as calculated from the following:   Height as of  10/30/22: 5' 10"$  (1.778 m).   Weight  as of 10/30/22: 195 lb (88.5 kg).  EKG (optional): deferred due to virtual visit  GENERAL: alert, oriented, no acute distress detected; full vision exam deferred due to pandemic and/or virtual encounter  PSYCH/NEURO: pleasant and cooperative, no obvious depression or anxiety, speech and thought processing grossly intact, Cognitive function grossly intact  [facility] Visit from 01/19/2023 in [place] [facility] [place]  PHQ-9 Total Score 0           01/19/2023    4:05 PM 10/30/2022    8:32 AM 01/15/2022    9:03 AM 10/29/2021    8:05 AM 01/30/2021    3:36 PM  Depression screen PHQ 2/9  Decreased Interest 0 0 0 0 0  Down, Depressed, Hopeless 0 0 0 0 0  PHQ - 2 Score 0 0 0 0 0  Altered sleeping 0 3  0   Tired, decreased energy 0 0  0   Change in appetite 0 0  0   Feeling bad or failure about yourself  0 0  0   Trouble concentrating 0 0  0   Moving slowly or fidgety/restless 0 0  0   Suicidal thoughts 0 0  0   PHQ-9 Score 0 3  0   Difficult doing work/chores Not difficult at all Not difficult at all  Not difficult at all        10/29/2021    8:05 AM 01/14/2022    9:36 AM 01/15/2022    9:07 AM 10/30/2022    8:31 AM 01/19/2023    4:05 PM  Fall Risk  Falls in the past year? 0 0 0 0 0  Was there an injury with Fall? 0  0 0 0  Fall Risk Category Calculator 0  0 0 0  Fall Risk Category (Retired) Low  Low Low   (RETIRED) Patient Fall Risk Level Low fall risk  Low fall risk Low fall risk   Patient at Risk for Falls Due to No Fall Risks  No Fall Risks No Fall Risks   Fall risk Follow up    Falls evaluation completed      SUMMARY AND PLAN:  Encounter for Medicare annual wellness exam     Discussed applicable health maintenance/preventive health measures and advised and referred or ordered per patient preferences:  Health Maintenance  Topic Date Due   DTaP/Tdap/Td (1 - Tdap) Never done, he plans to do at the  pharmacy   Diabetic kidney evaluation - Urine ACR  08/15/2015, sees kidney specialist   OPHTHALMOLOGY EXAM  05/18/2019, reports does yearly - asked CMA to request copy be sent to PCP office so can update/scan   FOOT EXAM  11/30/2023 (Originally 10/23/2020)   COVID-19 Vaccine (5 - 2023-24 season) 11/30/2023 (Originally 07/31/2022)   HEMOGLOBIN A1C  05/01/2023   Diabetic kidney evaluation - eGFR measurement  12/09/2023   Medicare Annual Wellness (AWV)  01/20/2024   Pneumonia Vaccine 60+ Years old  Completed   INFLUENZA VACCINE  Completed   Zoster Vaccines- Shingrix  Completed   HPV VACCINES  Aged Out   COLONOSCOPY (Pts 45-15yr Insurance coverage will need to be confirmed)  Discontinued    Education and counseling on the following was provided based on the above review of health and a plan/checklist for the patient, along with additional information discussed, was provided for the patient in the patient instructions :  -he just retired and has noticed activity level has decreased - discussed exercise guidelines for adults and  options for increasing exercise to 20 minutes per day with strength training 2 days per week, further info provided in patient instructions -discussed a healthy diet/nutrition, encouraged to try to consume a minimum of 5-6 servings of lower fruit veggies and fruits per day. Further info in patient instrucitons. -Advise yearly dental visits at minimum and regular eye exams   Follow up: see patient instructions   Patient Instructions  I really enjoyed getting to talk with you today! I am available on Tuesdays and Thursdays for virtual visits if you have any questions or concerns, or if I can be of any further assistance.   CHECKLIST FROM ANNUAL WELLNESS VISIT:  -Follow up (please call to schedule if not scheduled after visit):   -yearly for annual wellness visit with primary care office  Here is a list of your preventive care/health maintenance measures and the plan  for each if any are due:  Health Maintenance  Topic Date Due   DTaP/Tdap/Td (1 - Tdap) Never done   Diabetic kidney evaluation - Urine ACR  08/15/2015   OPHTHALMOLOGY EXAM  05/18/2019   FOOT EXAM  11/30/2023 (Originally 10/23/2020)   COVID-19 Vaccine (5 - 2023-24 season) 11/30/2023 (Originally 07/31/2022)   HEMOGLOBIN A1C  05/01/2023   Diabetic kidney evaluation - eGFR measurement  12/09/2023   Medicare Annual Wellness (AWV)  01/20/2024   Pneumonia Vaccine 37+ Years old  Completed   INFLUENZA VACCINE  Completed   Zoster Vaccines- Shingrix  Completed   HPV VACCINES  Aged Out   COLONOSCOPY (Pts 45-60yr Insurance coverage will need to be confirmed)  Discontinued    -See a dentist at least yearly  -Get your eyes checked and then per your eye specialist's recommendations  -Other issues addressed today:   -I have included below further information regarding a healthy whole foods based diet, physical activity guidelines for adults, stress management and opportunities for social connections. I hope you find this information useful.   -----------------------------------------------------------------------------------------------------------------------------------------------------------------------------------------------------------------------------------------------------------  NUTRITION: -eat real food: lots of colorful vegetables (half the plate) and fruits -5-7 servings of vegetables and fruits per day (fresh or steamed is best), exp. 2 servings of vegetables with lunch and dinner and 2 servings of fruit per day. Berries and greens such as kale and collards are great choices.  -consume on a regular basis: whole grains (make sure first ingredient on label contains the word "whole"), fresh fruits, fish, nuts, seeds, healthy oils (such as olive oil, avocado oil, grape seed oil) -may eat small amounts of dairy and lean meat on occasion, but avoid processed meats such as ham, bacon, lunch  meat, etc. -drink water -try to avoid fast food and pre-packaged foods, processed meat -most experts advise limiting sodium to < 23057mper day, should limit further is any chronic conditions such as high blood pressure, heart disease, diabetes, etc. The American Heart Association advised that < 150037ms is ideal -try to avoid foods that contain any ingredients with names you do not recognize  -try to avoid sugar/sweets (except for the natural sugar that occurs in fresh fruit) -try to avoid sweet drinks -try to avoid white rice, white bread, pasta (unless whole grain), white or yellow potatoes  EXERCISE GUIDELINES FOR ADULTS: -if you wish to increase your physical activity, do so gradually and with the approval of your doctor -STOP and seek medical care immediately if you have any chest pain, chest discomfort or trouble breathing when starting or increasing exercise  -move and stretch your body, legs, feet and  arms when sitting for long periods -Physical activity guidelines for optimal health in adults: -least 150 minutes per week of aerobic exercise (can talk, but not sing) once approved by your doctor, 20-30 minutes of sustained activity or two 10 minute episodes of sustained activity every day.  -resistance training at least 2 days per week if approved by your doctor -balance exercises 3+ days per week:   Stand somewhere where you have something sturdy to hold onto if you lose balance.    1) lift up on toes, start with 5x per day and work up to 20x   2) stand and lift on leg straight out to the side so that foot is a few inches of the floor, start with 5x each side and work up to 20x each side   3) stand on one foot, start with 5 seconds each side and work up to 20 seconds on each side  If you need ideas or help with getting more active:  -Silver sneakers https://tools.silversneakers.com  -Walk with a Doc: http://stephens-thompson.biz/  -try to include resistance (weight lifting/strength  building) and balance exercises twice per week: or the following link for ideas: ChessContest.fr  UpdateClothing.com.cy  STRESS MANAGEMENT: -can try meditating, or just sitting quietly with deep breathing while intentionally relaxing all parts of your body for 5 minutes daily -if you need further help with stress, anxiety or depression please follow up with your primary doctor or contact the wonderful folks at [place]: [place]: -options in [place] if you wish to engage in more social and exercise related activities:  -Silver sneakers https://tools.silversneakers.com  -Walk with a Doc: http://stephens-thompson.biz/  -Check out the [place] 50+ section on the [place] [employer] (hiking clubs, book clubs, cards and games, chess, exercise classes, aquatic classes and much more) - see the website for details: https://www.-[place].gov/departments/parks-recreation/active-adults50  -YouTube has lots of exercise videos for different ages and abilities as well  -[place] (a variety of indoor and outdoor inperson activities for adults). 541-635-5164. [address]  -Virtual Online Classes (a variety of topics): see seniorplanet.org or call 801-110-7692  -consider volunteering at a school, hospice center, church, senior center or elsewhere           Lucretia Kern, DO

## 2023-02-08 ENCOUNTER — Telehealth: Payer: Self-pay | Admitting: Family Medicine

## 2023-02-08 NOTE — Telephone Encounter (Signed)
Pt called to report that he received a no show letter in the mail.   Pt states he did not miss his [place] appointment for 01/19/23.  Pt states he spoke to Colin Benton on that day and his visit was complete.  Pt would like his record corrected to reflect that completed visit.  Please advise.

## 2023-02-09 NOTE — Telephone Encounter (Signed)
Good morning, Brittney... are you able to assist with this ?

## 2023-02-09 NOTE — Telephone Encounter (Signed)
Noted  

## 2023-02-24 NOTE — Progress Notes (Signed)
Care Management & Coordination Services Pharmacy Note  02/26/2023 Name:  Troy Howard MRN:  [id number] DOB:  06-12-1941  Summary: -Temazepam not working as well, taking melatonin too and not helping -A1C <7 -LDL at goal <70, TG not at goal <150  Recommendations/Changes made from today's visit: -Sch f/u with Dr. Sarajane Jews to discuss sleep concerns and have general f/u -Continue diabetic medication and exercise -Increase fish oil to 2000mg  per day to help achieve TG control  Follow up plan: -General in May -Pharmacist visit in October   Subjective: Troy Howard is an 82 y.o. year old male who is a primary patient of Troy Morale, MD.  The care coordination team was consulted for assistance with disease management and care coordination needs.    Engaged with patient by telephone for follow up visit.  Recent office visits: 01/19/23 Troy Benton, DO - For AWV, no med changes  10/30/2022 Troy Penna MD - Patient was seen for preventative health care and additional concerns. No medication changes.     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 No medication changes.    08/11/2022 Troy Lone MD (oncology) - Patient was seen for smoldering myeloma. No medication changes.     Hospital visits: None   Objective:  Lab Results  Component Value Date   CREATININE 1.61 (H) 12/08/2022   BUN 34 (H) 12/08/2022   GFR 39.66 (L) 10/30/2022   GFRNONAA 43 (L) 12/08/2022   GFRAA 46 (L) 10/28/2020   NA 134 (L) 12/08/2022   K 4.5 12/08/2022   CALCIUM 9.1 12/08/2022   CO2 24 12/08/2022   GLUCOSE 100 (H) 12/08/2022    Lab Results  Component Value Date/Time   HGBA1C 6.9 (H) 10/30/2022 08:59 AM   HGBA1C 6.7 (H) 10/29/2021 08:27 AM   GFR 39.66 (L) 10/30/2022 08:59 AM   GFR 34.53 (L) 10/29/2021 08:27 AM   MICROALBUR 7.3 (H) 08/14/2014 10:11 AM   MICROALBUR 1.1  03/22/2012 09:32 AM    Last diabetic Eye exam:  Lab Results  Component Value Date/Time   HMDIABEYEEXA No Retinopathy 11/04/2021 10:16 AM    Last diabetic Foot exam: No results found for: "HMDIABFOOTEX"   Lab Results  Component Value Date   CHOL 101 10/30/2022   HDL 19.10 (L) 10/30/2022   LDLCALC 44 10/29/2021   LDLDIRECT 51.0 10/30/2022   TRIG 208.0 (H) 10/30/2022   CHOLHDL 5 10/30/2022       Latest Ref Rng & Units 12/08/2022    8:42 AM 10/30/2022    8:59 AM 08/04/2022    7:44 AM  Hepatic Function  Total Protein 6.5 - 8.1 g/dL 9.3  9.0  9.2   Albumin 3.5 - 5.0 g/dL 3.7  3.9  3.8   AST 15 - 41 U/L 11  13  11    ALT 0 - 44 U/L 13  17  11    Alk Phosphatase 38 - 126 U/L 90  75  94   Total Bilirubin 0.3 - 1.2 mg/dL 0.5  0.6  0.5   Bilirubin, Direct 0.0 - 0.3 mg/dL  0.1      Lab Results  Component Value Date/Time   TSH 0.44 10/30/2022 08:59 AM   TSH 0.21 (L) 10/29/2021 08:27 AM   FREET4 0.87 10/30/2022 08:59 AM   FREET4 0.99 10/29/2021 08:27 AM       Latest Ref Rng & Units 12/08/2022    8:42  AM 10/30/2022    8:59 AM 08/04/2022    7:45 AM  CBC  WBC 4.0 - 10.5 K/uL 3.9  4.9  3.8   Hemoglobin 13.0 - 17.0 g/dL 11.6  12.0  11.4   Hematocrit 39.0 - 52.0 % 33.5  36.0  33.4   Platelets 150 - 400 K/uL 200  245.0  225     Lab Results  Component Value Date/Time   VD25OH 99.17 02/25/2021 08:08 AM   VD25OH 23.50 (L) 02/22/2018 04:41 PM   VD25OH 67 10/22/2009 08:53 PM   VITAMINB12 >2,000 (H) 10/28/2020 01:37 PM   VITAMINB12 161 (L) 02/22/2018 04:41 PM    Clinical ASCVD: No  The ASCVD Risk score (Arnett DK, et al., 2019) failed to calculate for the following reasons:   The 2019 ASCVD risk score is only valid for ages 80 to 67        01/19/2023    4:05 PM 10/30/2022    8:32 AM 01/15/2022    9:03 AM  Depression screen PHQ 2/9  Decreased Interest 0 0 0  Down, Depressed, Hopeless 0 0 0  PHQ - 2 Score 0 0 0  Altered sleeping 0 3   Tired, decreased energy 0 0   Change in  appetite 0 0   Feeling bad or failure about yourself  0 0   Trouble concentrating 0 0   Moving slowly or fidgety/restless 0 0   Suicidal thoughts 0 0   PHQ-9 Score 0 3   Difficult doing work/chores Not difficult at all Not difficult at all      Social History   Tobacco Use  Smoking Status Former   Packs/day: 0.25   Years: 5.00   Additional pack years: 0.00   Total pack years: 1.25   Types: Cigarettes   Quit date: 92   Years since quitting: 57.2  Smokeless Tobacco Never   BP Readings from Last 3 Encounters:  10/30/22 122/78  12/09/21 137/81  10/29/21 110/78   Pulse Readings from Last 3 Encounters:  10/30/22 71  12/09/21 84  10/29/21 77   Wt Readings from Last 3 Encounters:  10/30/22 195 lb (88.5 kg)  01/15/22 201 lb (91.2 kg)  12/09/21 200 lb 9.6 oz (91 kg)   BMI Readings from Last 3 Encounters:  10/30/22 27.98 kg/m  01/15/22 28.03 kg/m  12/09/21 27.98 kg/m    No Known Allergies  Medications Reviewed Today     Reviewed by Troy Howard, RPH (Pharmacist) on 02/26/23 at 1110  Med List Status: <None>   Medication Order Taking? Sig Documenting Provider Last Dose Status Informant  0.9 %  sodium chloride infusion [id number]   Ladene Artist, MD  Active   aspirin-acetaminophen-caffeine [facility] MIGRAINE) [phone] MG tablet [id number] No Take 1 tablet by mouth every 8 (eight) hours as needed for migraine. [provider] Taking Active Self  atorvastatin (LIPITOR) 10 MG tablet [id number]  Take 1 tablet (10 mg total) by mouth daily. Troy Morale, MD  Active   Blood Glucose Monitoring Suppl (ONE TOUCH ULTRA 2) w/Device KIT [id number] No Use to check blood sugars once daily. Troy Morale, MD Taking Active   canagliflozin [facility]) 300 MG TABS tablet [id number]  Take 1 tablet (300 mg total) by mouth daily before breakfast. Troy Morale, MD  Active   CHELATED ZINC PO [id number] No Take 1 capsule by mouth daily. [provider] Taking Active  Self  Cholecalciferol (VITAMIN D3) 2000 units capsule [id number] No Take 2,000  Units by mouth daily.  [provider] Taking Active Self  Cyanocobalamin (VITAMIN B 12 PO) [id number] No Take 2,500 mcg by mouth daily.  [provider] Taking Active Self  esomeprazole (NEXIUM) 40 MG capsule [id number]  Take 1 capsule (40 mg total) by mouth daily. Troy Morale, MD  Active   FOLIC ACID PO [id number] No Take 800 mcg by mouth daily.  [provider] Taking Active Self  glipiZIDE (GLUCOTROL) 10 MG tablet [id number]  Take 1 tablet (10 mg total) by mouth 2 (two) times daily before a meal. Troy Morale, MD  Active   [facility] GARLIC EXTRACT PO [id number] No Take 800 mg by mouth daily. [provider] Taking Active Self  Levomefolate Glucosamine (METHYLFOLATE PO) [id number] No Take 1 tablet by mouth daily. [provider] Taking Active Self  olmesartan (BENICAR) 40 MG tablet [id number]  Take 1 tablet (40 mg total) by mouth daily. Troy Morale, MD  Active   Omega-3 Fatty Acids (FISH OIL) 1000 MG CAPS [id number] No Take by mouth daily.  [provider] Taking Active Self  OneTouch Delica Lancets [id number] [place] [id number] No USE ONCE A DAY WHEN CHECKING BLOOD SUGAR Troy Morale, MD Taking Active   [facility] VERIO test strip [id number] No USE ONCE A DAY TO TEST BLOOD SUGAR Troy Morale, MD Taking Active   pyridOXINE (VITAMIN B-6) 100 MG tablet [id number] No Take 100 mg by mouth daily. [provider] Taking Active Self  SYNTHROID 137 MCG tablet [id number]  Take 1 tablet (137 mcg total) by mouth daily before breakfast. Troy Morale, MD  Active   temazepam (RESTORIL) 30 MG capsule [id number]  TAKE 1 CAPSULE BY MOUTH AT BEDTIME AS NEEDED FOR SLEEP Troy Morale, MD  Active             SDOH:  (Social Determinants of Health) assessments and interventions performed: Yes SDOH Interventions    [place] Coordination from 02/26/2023 in [place] [facility] from 07/14/2022 in [place] [facility] [place] from 01/15/2022 in [place] [facility] [place] from 01/30/2021 in [place] at [place] Interventions Intervention Not Indicated -- Intervention Not Indicated Intervention Not Indicated  Housing Interventions Intervention Not Indicated -- Intervention Not Indicated Intervention Not Indicated  Transportation Interventions -- -- Intervention Not Indicated Intervention Not Indicated  Financial Strain Interventions -- Intervention Not Indicated Intervention Not Indicated Intervention Not Indicated  Physical Activity Interventions -- -- Intervention Not Indicated Intervention Not Indicated  Stress Interventions -- -- Intervention Not Indicated Intervention Not Indicated  Social Connections Interventions -- -- Intervention Not Indicated Intervention Not Indicated       Medication Assistance: None required.  Patient affirms current coverage meets needs.  Medication Access: Within the past 30 days, how often has patient missed a dose of medication? None Is a pillbox or other method used to improve adherence? Yes  Factors that may affect medication adherence? no barriers identified Are meds synced by current pharmacy? No  Are meds delivered by current pharmacy? No  Does patient experience delays in picking up medications due to transportation concerns? No   [facility] Reviewed: Is patient disadvantaged to use [facility]?: Yes  Current Rx insurance plan: HTA [facility] and [facility] of [facility]  [id number] [place], [place] [facility] [address] [place] [address] Phone: [phone]  Fax: [phone]  [facility] services reviewed with patient today?: No  Patient requests to transfer care to [facility]?: No  Reason patient declined to change  pharmacies: Disadvantaged due to insurance/mail order  Compliance/Adherence/Medication fill history: Care Gaps: AWV - completed 01/19/2023 Last eye exam / retinopathy screening: 05/17/2018 Last diabetic foot exam: 10/24/2019 Tdap - never done Urine ACR - overdue Covid - postponed  Star-Rating Drugs: Atorvastatin 10mg  - [facility] Invokana 300mg  - [facility]  Glipizide 10mg  - last filled [facility]/30/2022 [facility] Olmesartan 40 mg - [facility]   Assessment/Plan   Hyperlipidemia: (LDL goal < 70, TG <150) -Not ideally controlled -Current treatment: Atorvastatin 10mg  1 qd  Appropriate, Effective, Safe, Accessible Omega 3 Fish Oil 1000mg  Appropriate, Effective, Safe, Accessible -Medications previously tried: None  -Current dietary patterns: on GoLo diet program -Current exercise habits: walks alot -Educated on Cholesterol goals;  Benefits of statin for ASCVD risk reduction; Importance of limiting foods high in cholesterol; Exercise goal of 150 minutes per week; -Recommended to continue current medication  Diabetes (A1c goal <7%) -Controlled -Current medications: Invokana 300mg  1 qd Appropriate, Effective, Safe, Accessible Glipizide 10mg  1 BID Appropriate, Effective, Safe, Accessible -Medications previously tried: Januvia  -Current home glucose readings Denies any high or lows -Denies hypoglycemic/hyperglycemic symptoms -Current meal patterns:  See above -Current exercise: see above -Educated on A1c and blood sugar goals; Complications of diabetes including kidney damage, retinal damage, and cardiovascular disease; Exercise goal of 150 minutes per week; Benefits of routine self-monitoring of blood sugar; -Counseled to check feet daily and get yearly eye exams -Recommended to continue current medication  [place] Pharmacist [phone]

## 2023-02-25 ENCOUNTER — Telehealth: Payer: Self-pay

## 2023-02-25 NOTE — Progress Notes (Signed)
Care Management & Coordination Services Pharmacy Team  Reason for Encounter: Appointment Reminder  Contacted patient to confirm telephone appointment with Burman Riis, PharmD on 02/26/2023 at 11:15. Spoke with patient on 02/25/2023   Do you have any problems getting your medications? No  What is your top health concern you would like to discuss at your upcoming visit? Patient has concerns about temazepam, it is not helping him with sleep.   Have you seen any other providers since your last visit with PCP? No  Star Rating Drugs:  Atorvastatin 10mg  - [facility] Invokana 300mg  - [facility]2021 [facility]  Glipizide 10mg  - last filled [facility]/31/2023 [facility] Olmesartan 40 mg - last [facility]2023 [facility]   Care Gaps: AWV - scheduled 01/19/2023 Last eye exam  - 11/04/2021 Last foot exam: 10/24/2019 Tdap - never done Urine ACR - overdue Covid - postponed  [place] Pharmacist Assistant [phone]

## 2023-02-26 ENCOUNTER — Ambulatory Visit: Payer: PPO

## 2023-03-03 ENCOUNTER — Telehealth: Payer: Self-pay | Admitting: Family Medicine

## 2023-03-03 ENCOUNTER — Other Ambulatory Visit: Payer: Self-pay

## 2023-03-03 DIAGNOSIS — E119 Type 2 diabetes mellitus without complications: Secondary | ICD-10-CM

## 2023-03-03 MED ORDER — ONETOUCH VERIO VI STRP: ORAL_STRIP | 0 refills | Status: DC

## 2023-03-03 NOTE — Telephone Encounter (Signed)
Prescription Request  03/03/2023  LOV: 10/30/2022  What is the name of the medication or equipment? ONETOUCH VERIO test strip   Have you contacted your pharmacy to request a refill? Yes   Which pharmacy would you like this sent to?  [id number] [place] [place] [place] [address] Phone: [phone] Fax: [phone]    Patient notified that their request is being sent to the clinical staff for review and that they should receive a response within 2 business days.   Please advise at Mobile [phone] (mobile)

## 2023-03-03 NOTE — Telephone Encounter (Signed)
Noted  

## 2023-03-03 NOTE — Telephone Encounter (Signed)
[facility]

## 2023-03-10 ENCOUNTER — Telehealth: Payer: Self-pay

## 2023-03-10 ENCOUNTER — Other Ambulatory Visit (HOSPITAL_COMMUNITY): Payer: Self-pay

## 2023-03-10 NOTE — Telephone Encounter (Signed)
Pharmacy Patient Advocate Encounter   Received notification that prior authorization for Invokana 300mg  is required/requested.  Per Test Claim: Product not on formulary   PA submitted on 03/10/23 to (ins) [employer] medicare via CoverMyMeds Key  # [id number] Status is pending

## 2023-03-12 NOTE — Telephone Encounter (Signed)
Patient Advocate Encounter  Prior Authorization for Invokana 300MG  tablets has been approved.    Effective dates: 03/10/23 through 11/30/23

## 2023-03-15 NOTE — Telephone Encounter (Signed)
[facility] [address] and left a detailed message with the information below on the voicemail for the pharmacist.

## 2023-03-16 ENCOUNTER — Ambulatory Visit (INDEPENDENT_AMBULATORY_CARE_PROVIDER_SITE_OTHER): Payer: PPO | Admitting: Family Medicine

## 2023-03-16 ENCOUNTER — Encounter: Payer: Self-pay | Admitting: Family Medicine

## 2023-03-16 VITALS — BP 120/74 | HR 63 | Temp 97.6°F | Wt 195.0 lb

## 2023-03-16 DIAGNOSIS — L299 Pruritus, unspecified: Secondary | ICD-10-CM

## 2023-03-16 DIAGNOSIS — G4700 Insomnia, unspecified: Secondary | ICD-10-CM | POA: Diagnosis not present

## 2023-03-16 MED ORDER — ZOLPIDEM TARTRATE 10 MG PO TABS: 10.0000 mg | ORAL_TABLET | Freq: Every evening | ORAL | 5 refills | Status: DC | PRN

## 2023-03-16 NOTE — Progress Notes (Signed)
   Subjective:    Patient ID: Troy Howard, male    DOB: March 10, 1941, 82 y.o.   MRN: [id number]  HPI Here for several issues. First he says the Temazepam is not working as well as it used to. He takes this plus melatonin every night, and it still takes over an hour before he can fall asleep. Also he complains of itching all over his body foe the past few months. This comes and goes. There is never any visible rash. He notes that he showers daily and often showers twice a day because he has "oily hair". He uses [employer].    Review of Systems  Constitutional: Negative.   Respiratory: Negative.    Cardiovascular: Negative.   Skin:  Negative for color change and rash.  Psychiatric/Behavioral:  Positive for sleep disturbance. Negative for agitation, behavioral problems, confusion, decreased concentration and dysphoric mood. The patient is nervous/anxious.        Objective:   Physical Exam Constitutional:      Appearance: Normal appearance.  Cardiovascular:     Rate and Rhythm: Normal rate and regular rhythm.     Pulses: Normal pulses.     Heart sounds: Normal heart sounds.  Pulmonary:     Effort: Pulmonary effort is normal.     Breath sounds: Normal breath sounds.  Skin:    Findings: No erythema or rash.  Neurological:     General: No focal deficit present.     Mental Status: He is alert and oriented to person, place, and time.  Psychiatric:        Mood and Affect: Mood normal.           Assessment & Plan:  For insomnia, we will stop Temazepam and he will try Zolpidem 10 mg at bedtime. I think his itchy skin is a product of excessive drying and possibly an allergic component. I advised him to only shower once a day, and if he wants to shampoo his hair an additional time to avoid using soap more than once a day. He will apply a moisturizing lotion twice daily. He can also try taking an antihistamine like Claritin daily.  [name], MD

## 2023-03-23 ENCOUNTER — Other Ambulatory Visit: Payer: Self-pay

## 2023-03-23 DIAGNOSIS — C9000 Multiple myeloma not having achieved remission: Secondary | ICD-10-CM

## 2023-03-24 ENCOUNTER — Inpatient Hospital Stay: Payer: PPO | Attending: Hematology

## 2023-03-24 DIAGNOSIS — E1122 Type 2 diabetes mellitus with diabetic chronic kidney disease: Secondary | ICD-10-CM | POA: Insufficient documentation

## 2023-03-24 DIAGNOSIS — E538 Deficiency of other specified B group vitamins: Secondary | ICD-10-CM | POA: Insufficient documentation

## 2023-03-24 DIAGNOSIS — C9000 Multiple myeloma not having achieved remission: Secondary | ICD-10-CM | POA: Diagnosis not present

## 2023-03-24 DIAGNOSIS — N183 Chronic kidney disease, stage 3 unspecified: Secondary | ICD-10-CM | POA: Diagnosis not present

## 2023-03-24 DIAGNOSIS — I129 Hypertensive chronic kidney disease with stage 1 through stage 4 chronic kidney disease, or unspecified chronic kidney disease: Secondary | ICD-10-CM | POA: Diagnosis not present

## 2023-03-24 LAB — CBC WITH DIFFERENTIAL (CANCER CENTER ONLY)
Abs Immature Granulocytes: 0.01 10*3/uL (ref 0.00–0.07)
Basophils Absolute: 0 10*3/uL (ref 0.0–0.1)
Basophils Relative: 0 %
Eosinophils Absolute: 0.2 10*3/uL (ref 0.0–0.5)
Eosinophils Relative: 5 %
HCT: 34.4 % — ABNORMAL LOW (ref 39.0–52.0)
Hemoglobin: 11.4 g/dL — ABNORMAL LOW (ref 13.0–17.0)
Immature Granulocytes: 0 %
Lymphocytes Relative: 47 %
Lymphs Abs: 2 10*3/uL (ref 0.7–4.0)
MCH: 31.6 pg (ref 26.0–34.0)
MCHC: 33.1 g/dL (ref 30.0–36.0)
MCV: 95.3 fL (ref 80.0–100.0)
Monocytes Absolute: 0.5 10*3/uL (ref 0.1–1.0)
Monocytes Relative: 12 %
Neutro Abs: 1.5 10*3/uL — ABNORMAL LOW (ref 1.7–7.7)
Neutrophils Relative %: 36 %
Platelet Count: 197 10*3/uL (ref 150–400)
RBC: 3.61 MIL/uL — ABNORMAL LOW (ref 4.22–5.81)
RDW: 13.6 % (ref 11.5–15.5)
WBC Count: 4.3 10*3/uL (ref 4.0–10.5)
nRBC: 0 % (ref 0.0–0.2)

## 2023-03-24 LAB — CMP (CANCER CENTER ONLY)
ALT: 12 U/L (ref 0–44)
AST: 10 U/L — ABNORMAL LOW (ref 15–41)
Albumin: 3.7 g/dL (ref 3.5–5.0)
Alkaline Phosphatase: 80 U/L (ref 38–126)
Anion gap: 4 — ABNORMAL LOW (ref 5–15)
BUN: 29 mg/dL — ABNORMAL HIGH (ref 8–23)
CO2: 26 mmol/L (ref 22–32)
Calcium: 9.2 mg/dL (ref 8.9–10.3)
Chloride: 106 mmol/L (ref 98–111)
Creatinine: 1.67 mg/dL — ABNORMAL HIGH (ref 0.61–1.24)
GFR, Estimated: 41 mL/min — ABNORMAL LOW (ref >60)
Glucose, Bld: 81 mg/dL (ref 70–99)
Potassium: 4.7 mmol/L (ref 3.5–5.1)
Sodium: 136 mmol/L (ref 135–145)
Total Bilirubin: 0.4 mg/dL (ref 0.3–1.2)
Total Protein: 9.3 g/dL — ABNORMAL HIGH (ref 6.5–8.1)

## 2023-03-25 LAB — KAPPA/LAMBDA LIGHT CHAINS
Kappa free light chain: 14.4 mg/L (ref 3.3–19.4)
Kappa, lambda light chain ratio: 0.19 — ABNORMAL LOW (ref 0.26–1.65)
Lambda free light chains: 74.2 mg/L — ABNORMAL HIGH (ref 5.7–26.3)

## 2023-03-29 LAB — MULTIPLE MYELOMA PANEL, SERUM
Albumin SerPl Elph-Mcnc: 3.3 g/dL (ref 2.9–4.4)
Albumin/Glob SerPl: 0.7 (ref 0.7–1.7)
Alpha 1: 0.3 g/dL (ref 0.0–0.4)
Alpha2 Glob SerPl Elph-Mcnc: 1 g/dL (ref 0.4–1.0)
B-Globulin SerPl Elph-Mcnc: 0.8 g/dL (ref 0.7–1.3)
Gamma Glob SerPl Elph-Mcnc: 2.8 g/dL — ABNORMAL HIGH (ref 0.4–1.8)
Globulin, Total: 5 g/dL — ABNORMAL HIGH (ref 2.2–3.9)
IgA: 40 mg/dL — ABNORMAL LOW (ref 61–437)
IgG (Immunoglobin G), Serum: 4027 mg/dL — ABNORMAL HIGH (ref 603–1613)
IgM (Immunoglobulin M), Srm: 19 mg/dL (ref 15–143)
M Protein SerPl Elph-Mcnc: 2.6 g/dL — ABNORMAL HIGH
Total Protein ELP: 8.3 g/dL (ref 6.0–8.5)

## 2023-03-31 ENCOUNTER — Telehealth: Payer: Self-pay

## 2023-03-31 MED ORDER — TEMAZEPAM 30 MG PO CAPS: 30.0000 mg | ORAL_CAPSULE | Freq: Every evening | ORAL | 5 refills | Status: DC | PRN

## 2023-03-31 NOTE — Telephone Encounter (Signed)
Done

## 2023-03-31 NOTE — Progress Notes (Signed)
Care Management & Coordination Services Pharmacy Team  Reason for Encounter: General adherence update   Contacted patient for general health update.  Spoke with patient on 03/31/2023    Has patients sleep improved?  Patient states Ambien has not helped him with sleep at all, he feels he got better sleep with Temazepam, his only issue with temazepam was it would take him 1-2 hours to fall asleep after taking it. Patient is requesting to discontinue Ambien and go back on Temazepam.   The patient denies side effects with their medications.   Are you having any problems getting your medications from your pharmacy? No  Has the cost of your medications been a concern? No.   The patient has not had an ED visit since last contact.   The patient denies problems with their health.   Patient reports the following concerns or questions for Delano Metz, PharmD at this time.  Patient is requesting to discontinue Ambien and go back on Temazepam.   Care Gaps: AWV - scheduled 01/19/2023 Last eye exam  - 11/04/2021 Last foot exam: 10/24/2019 Last BP - 120/74 on 03/16/2023 Last A1C - 6.9 on 10/30/2022 Tdap - never seen Urine ACR - overdue Covid - postponed  Star Rating Drugs:  Atorvastatin 10mg  - last [facility] Invokana 300mg  - last filled [facility]/13/2022 [facility] verified  Glipizide 10mg  - last filled [facility]/11/2023 [facility] Olmesartan 40 mg - last [facility]  Chart Updates:  Recent office visits:  03/16/2023 [name] MD - Patient was seen for insomnia and itching. Started Zolpidem 10 mg qhs prn. Discontinued Temazepam.   Recent consult visits:  None  Hospital visits:  None  Medications: Outpatient Encounter Medications as of 03/31/2023  Medication Sig   aspirin-acetaminophen-caffeine (EXCEDRIN MIGRAINE) 250-250-65 MG tablet Take 1 tablet by mouth every 8 (eight) hours as needed for migraine.   atorvastatin (LIPITOR) 10 MG tablet Take 1  tablet (10 mg total) by mouth daily.   Blood Glucose Monitoring Suppl (ONE TOUCH ULTRA 2) w/Device KIT Use to check blood sugars once daily.   canagliflozin (INVOKANA) 300 MG TABS tablet Take 1 tablet (300 mg total) by mouth daily before breakfast.   CHELATED ZINC PO Take 1 capsule by mouth daily.   Cholecalciferol (VITAMIN D3) 2000 units capsule Take 2,000 Units by mouth daily.    Cyanocobalamin (VITAMIN B 12 PO) Take 2,500 mcg by mouth daily.    esomeprazole (NEXIUM) 40 MG capsule Take 1 capsule (40 mg total) by mouth daily.   FOLIC ACID PO Take 800 mcg by mouth daily.    glipiZIDE (GLUCOTROL) 10 MG tablet Take 1 tablet (10 mg total) by mouth 2 (two) times daily before a meal.   glucose blood (ONETOUCH VERIO) test strip USE ONCE A DAY TO TEST BLOOD SUGAR   [facility] GARLIC EXTRACT PO Take 800 mg by mouth daily.   Levomefolate Glucosamine (METHYLFOLATE PO) Take 1 tablet by mouth daily.   olmesartan (BENICAR) 40 MG tablet Take 1 tablet (40 mg total) by mouth daily.   Omega-3 Fatty Acids (FISH OIL) 1000 MG CAPS Take by mouth daily.    OneTouch Delica Lancets 30G MISC USE ONCE A DAY WHEN CHECKING BLOOD SUGAR   pyridOXINE (VITAMIN B-6) 100 MG tablet Take 100 mg by mouth daily.   SYNTHROID 137 MCG tablet Take 1 tablet (137 mcg total) by mouth daily before breakfast.   zolpidem (AMBIEN) 10 MG tablet Take  1 tablet (10 mg total) by mouth at bedtime as needed for sleep.   Facility-Administered Encounter Medications as of 03/31/2023  Medication   0.9 %  sodium chloride infusion  Fill History:  Dispensed Days Supply Quantity Provider Pharmacy  ATORVASTATIN 10 MG TABLET 01/23/2023 90 90 each      Dispensed Days Supply Quantity Provider Pharmacy  INVOKANA 300 MG TABLET 12/30/2021 30 30 each      Dispensed Days Supply Quantity Provider Pharmacy  ESOMEPRAZOLE MAG DR 40 MG CAP 01/12/2023 90 90 each      Dispensed Days Supply Quantity Provider Pharmacy  GLIPIZIDE 10 MG TABLET 03/11/2023 90 180 each       Dispensed Days Supply Quantity Provider Pharmacy  SYNTHROID 137 MCG TABLET 03/11/2023 90 90 each      Dispensed Days Supply Quantity Provider Pharmacy  OLMESARTAN MEDOXOMIL 40 MG TAB 02/12/2023 90 90 each      Dispensed Days Supply Quantity Provider Pharmacy  ZOLPIDEM TARTRATE 10 MG TABLET 03/16/2023 30 30 each     Recent vitals BP Readings from Last 3 Encounters:  03/16/23 120/74  10/30/22 122/78  12/09/21 137/81   Pulse Readings from Last 3 Encounters:  03/16/23 63  10/30/22 71  12/09/21 84   Wt Readings from Last 3 Encounters:  03/16/23 195 lb (88.5 kg)  10/30/22 195 lb (88.5 kg)  01/15/22 201 lb (91.2 kg)   BMI Readings from Last 3 Encounters:  03/16/23 27.98 kg/m  10/30/22 27.98 kg/m  01/15/22 28.03 kg/m    Recent lab results    Component Value Date/Time   NA 136 03/24/2023 0804   K 4.7 03/24/2023 0804   CL 106 03/24/2023 0804   CO2 26 03/24/2023 0804   GLUCOSE 81 03/24/2023 0804   BUN 29 (H) 03/24/2023 0804   CREATININE 1.67 (H) 03/24/2023 0804   CREATININE 1.63 (H) 10/28/2020 1337   CALCIUM 9.2 03/24/2023 0804    Lab Results  Component Value Date   CREATININE 1.67 (H) 03/24/2023   GFR 39.66 (L) 10/30/2022   GFRNONAA 41 (L) 03/24/2023   GFRAA 46 (L) 10/28/2020   Lab Results  Component Value Date/Time   HGBA1C 6.9 (H) 10/30/2022 08:59 AM   HGBA1C 6.7 (H) 10/29/2021 08:27 AM   MICROALBUR 7.3 (H) 08/14/2014 10:11 AM   MICROALBUR 1.1 03/22/2012 09:32 AM    Lab Results  Component Value Date   CHOL 101 10/30/2022   HDL 19.10 (L) 10/30/2022   LDLCALC 44 10/29/2021   LDLDIRECT 51.0 10/30/2022   TRIG 208.0 (H) 10/30/2022   CHOLHDL 5 10/30/2022   Inetta Fermo CMA  Clinical Pharmacist Assistant [phone]

## 2023-03-31 NOTE — Telephone Encounter (Signed)
Pt states the Remus Loffler is not working at all and would like to go back to the Temazepam.  Requesting a new rx for Temazepam 30mg  1 capsule at bedtime to replace the ambien.  [facility] Info[id number] [place], [place] - [address] Phone: [phone]  Fax: [phone]      Sherrill Raring Clinical Pharmacist [phone]

## 2023-04-01 ENCOUNTER — Encounter (HOSPITAL_COMMUNITY)
Admission: RE | Admit: 2023-04-01 | Discharge: 2023-04-01 | Disposition: A | Discharge status: Home / Self Care | Payer: PPO | Source: Ambulatory Visit | Attending: Hematology | Admitting: Hematology

## 2023-04-01 DIAGNOSIS — C9000 Multiple myeloma not having achieved remission: Secondary | ICD-10-CM | POA: Insufficient documentation

## 2023-04-01 LAB — GLUCOSE, CAPILLARY: Glucose-Capillary: 120 mg/dL — ABNORMAL HIGH (ref 70–99)

## 2023-04-01 MED ORDER — FLUDEOXYGLUCOSE F - 18 (FDG) INJECTION
9.7800 | Freq: Once | INTRAVENOUS | Status: AC
  Administered 2023-04-01: 9.78 via INTRAVENOUS

## 2023-04-07 ENCOUNTER — Inpatient Hospital Stay: Payer: PPO | Attending: Hematology | Admitting: Hematology

## 2023-04-07 DIAGNOSIS — C9000 Multiple myeloma not having achieved remission: Secondary | ICD-10-CM | POA: Diagnosis not present

## 2023-04-07 DIAGNOSIS — D472 Monoclonal gammopathy: Secondary | ICD-10-CM | POA: Diagnosis not present

## 2023-04-07 NOTE — Progress Notes (Signed)
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 male who was contacted via phone for continued evaluation and management of multiple myeloma. I had an phone visit with the patient on 12/15/2022 and he reported feeling well overall.   I connected with Troy Howard on 04/07/23 at  8:40 AM EDT by telephone visit and verified that I am speaking with the correct person using two identifiers.   I discussed the limitations, risks, security and privacy concerns of performing an evaluation and management service by telemedicine and the availability of in-person appointments. I also discussed with the patient that there may be a patient responsible charge related to this service. The patient expressed understanding and agreed to proceed.   Other persons participating in the visit and their role in the encounter: none   Patient's location: home  Provider's location: [facility]   Chief Complaint: evaluation and management of multiple myeloma.   Today,  MEDICAL HISTORY:  Past Medical History:  Diagnosis Date   Arthritis    back   Cancer [facility])    prostate, sees Dr. Heloise Purpura    Chronic kidney disease 2017   stage 3 kidney disease   Diabetes mellitus    Erectile dysfunction    GERD (gastroesophageal reflux disease)    Hyperlipidemia    Hypertension    Migraines    Peptic ulcer    Thyroid disease     SURGICAL HISTORY: Past Surgical History:  Procedure Laterality Date   APPENDECTOMY  1950   COLONOSCOPY  05/22/2021   per Dr. Russella Dar, benign polyps, no repeats needed   HERNIA REPAIR      umbilical    MENISCUS REPAIR Left    PROSTATE SURGERY  2009   radical prostatectomy   SPINE SURGERY     L4-L5 fusion per Dr. Trey Sailors    TONSILLECTOMY AND ADENOIDECTOMY  1954    SOCIAL HISTORY: Social History   Socioeconomic History   Marital status: Married    Spouse name: Not on file   Number of children: Not on file   Years of education: Not on file   Highest education level: Some college, no degree  Occupational History   Not on file  Tobacco Use   Smoking status: Former    Packs/day: 0.25    Years: 5.00    Additional pack years: 0.00    Total pack years: 1.25    Types: Cigarettes    Quit date: 53    Years since quitting: 57.3   Smokeless tobacco: Never  Vaping Use   Vaping Use: Never used  Substance and Sexual Activity   Alcohol use: Not Currently    Comment: rare one glass every 6 months   Drug use: No   Sexual activity: Yes  Other Topics Concern   Not on file  Social History Narrative   Not on file   Social Determinants of Health   Financial Resource Strain: Low Risk  (07/14/2022)   Overall Financial Resource Strain (CARDIA)    Difficulty of Paying Living Expenses: Not hard at all  Food Insecurity: No Food Insecurity (03/15/2023)   Hunger  Vital Sign    Worried About Programme researcher, broadcasting/film/video in the Last Year: Never true    Ran Out of Food in the Last Year: Never true  Transportation Needs: No Transportation Needs (03/15/2023)   PRAPARE - Administrator, Civil Service (Medical): No    Lack of Transportation (Non-Medical): No  Physical Activity: Insufficiently Active (03/15/2023)   Exercise Vital Sign    Days of Exercise per Week: 3 days    Minutes of Exercise per Session: 20 min  Stress: Stress Concern Present (03/15/2023)   [employer] of Occupational Health - Occupational Stress Questionnaire    Feeling of Stress : To some extent  Social Connections: Moderately Isolated (03/15/2023)   Social Connection and Isolation Panel [NHANES]     Frequency of Communication with Friends and Family: Three times a week    Frequency of Social Gatherings with Friends and Family: Once a week    Attends Religious Services: Never    Database administrator or Organizations: No    Attends Engineer, structural: Not on file    Marital Status: Married  Catering manager Violence: Not At Risk (01/15/2022)   Humiliation, Afraid, Rape, and Kick questionnaire    Fear of Current or Ex-Partner: No    Emotionally Abused: No    Physically Abused: No    Sexually Abused: No    FAMILY HISTORY: Family History  Problem Relation Age of Onset   Hypertension Other    Cancer Other    Colon cancer Neg Hx    Colon polyps Neg Hx    Esophageal cancer Neg Hx    Rectal cancer Neg Hx    Stomach cancer Neg Hx     ALLERGIES:  has No Known Allergies.  MEDICATIONS:  Current Outpatient Medications  Medication Sig Dispense Refill   aspirin-acetaminophen-caffeine (EXCEDRIN MIGRAINE) 250-250-65 MG tablet Take 1 tablet by mouth every 8 (eight) hours as needed for migraine.     atorvastatin (LIPITOR) 10 MG tablet Take 1 tablet (10 mg total) by mouth daily. 90 tablet 3   Blood Glucose Monitoring Suppl (ONE TOUCH ULTRA 2) w/Device KIT Use to check blood sugars once daily. 1 kit 0   canagliflozin (INVOKANA) 300 MG TABS tablet Take 1 tablet (300 mg total) by mouth daily before breakfast. 90 tablet 3   CHELATED ZINC PO Take 1 capsule by mouth daily.     Cholecalciferol (VITAMIN D3) 2000 units capsule Take 2,000 Units by mouth daily.      Cyanocobalamin (VITAMIN B 12 PO) Take 2,500 mcg by mouth daily.      esomeprazole (NEXIUM) 40 MG capsule Take 1 capsule (40 mg total) by mouth daily. 90 capsule 3   FOLIC ACID PO Take 800 mcg by mouth daily.      glipiZIDE (GLUCOTROL) 10 MG tablet Take 1 tablet (10 mg total) by mouth 2 (two) times daily before a meal. 180 tablet 3   glucose blood (ONETOUCH VERIO) test strip USE ONCE A DAY TO TEST BLOOD SUGAR 100 strip 0   GNP  GARLIC EXTRACT PO Take 800 mg by mouth daily.     Levomefolate Glucosamine (METHYLFOLATE PO) Take 1 tablet by mouth daily.     olmesartan (BENICAR) 40 MG tablet Take 1 tablet (40 mg total) by mouth daily. 90 tablet 3   Omega-3 Fatty Acids (FISH OIL) 1000 MG CAPS Take by mouth daily.      OneTouch Delica Lancets 30G MISC USE ONCE A DAY WHEN  CHECKING BLOOD SUGAR 100 each 3   pyridOXINE (VITAMIN B-6) 100 MG tablet Take 100 mg by mouth daily.     SYNTHROID 137 MCG tablet Take 1 tablet (137 mcg total) by mouth daily before breakfast. 90 tablet 3   temazepam (RESTORIL) 30 MG capsule Take 1 capsule (30 mg total) by mouth at bedtime as needed for sleep. 30 capsule 5   Current Facility-Administered Medications  Medication Dose Route Frequency Provider Last Rate Last Admin   0.9 %  sodium chloride infusion  500 mL Intravenous Once Meryl Dare, MD        REVIEW OF SYSTEMS:    10 Point review of Systems was done is negative except as noted above.   PHYSICAL EXAMINATION: Telemedicine visit  LABORATORY DATA:  I have reviewed the data as listed  .    Latest Ref Rng & Units 03/24/2023    8:04 AM 12/08/2022    8:42 AM 10/30/2022    8:59 AM  CBC  WBC 4.0 - 10.5 K/uL 4.3  3.9  4.9   Hemoglobin 13.0 - 17.0 g/dL 16.1  09.6  04.5   Hematocrit 39.0 - 52.0 % 34.4  33.5  36.0   Platelets 150 - 400 K/uL 197  200  245.0    . CBC    Component Value Date/Time   WBC 4.3 03/24/2023 0804   WBC 4.9 10/30/2022 0859   RBC 3.61 (L) 03/24/2023 0804   HGB 11.4 (L) 03/24/2023 0804   HCT 34.4 (L) 03/24/2023 0804   PLT 197 03/24/2023 0804   MCV 95.3 03/24/2023 0804   MCH 31.6 03/24/2023 0804   MCHC 33.1 03/24/2023 0804   RDW 13.6 03/24/2023 0804   LYMPHSABS 2.0 03/24/2023 0804   MONOABS 0.5 03/24/2023 0804   EOSABS 0.2 03/24/2023 0804   BASOSABS 0.0 03/24/2023 0804    .    Latest Ref Rng & Units 03/24/2023    8:04 AM 12/08/2022    8:42 AM 10/30/2022    8:59 AM  CMP  Glucose 70 - 99 mg/dL 81  409   71   BUN 8 - 23 mg/dL 29  34  43   Creatinine 0.61 - 1.24 mg/dL 8.11  9.14  7.82   Sodium 135 - 145 mmol/L 136  134  135   Potassium 3.5 - 5.1 mmol/L 4.7  4.5  4.6   Chloride 98 - 111 mmol/L 106  105  105   CO2 22 - 32 mmol/L 26  24  21    Calcium 8.9 - 10.3 mg/dL 9.2  9.1  8.7   Total Protein 6.5 - 8.1 g/dL 9.3  9.3  9.0   Total Bilirubin 0.3 - 1.2 mg/dL 0.4  0.5  0.6   Alkaline Phos 38 - 126 U/L 80  90  75   AST 15 - 41 U/L 10  11  13    ALT 0 - 44 U/L 12  13  17      03/27/2021 BM Bx   DIAGNOSIS:   BONE MARROW, ASPIRATE, CLOT, CORE:  -  Hypercellular bone marrow with involvement by plasma cell neoplasm  -  See comment    COMMENT:   Plasma cells are increased on aspirate smears (8% by manual differential  count) and by CD138 immunohistochemistry on the core biopsy and clot  section (15%).  Plasma cells express lambda-restricted light chains by  in situ hybridization.  Together the findings are consistent with bone  marrow involvement by a lambda-restricted plasma  cell neoplasm.  Correlation with clinical impressions, radiographic studies, other  laboratory data, and cytogenetic/FISH results is recommended.   03/27/2021 Molecular Pathology    03/27/2021 Cytogenetics    RADIOGRAPHIC STUDIES: I have personally reviewed the radiological images as listed and agreed with the findings in the report. NM PET Image Restage (PS) Whole Body  Result Date: 04/05/2023 CLINICAL DATA:  Subsequent treatment strategy for multiple myeloma. EXAM: NUCLEAR MEDICINE PET WHOLE BODY TECHNIQUE: 9.8 mCi F-18 FDG was injected intravenously. Full-ring PET imaging was performed from the head to foot after the radiotracer. CT data was obtained and used for attenuation correction and anatomic localization. Fasting blood glucose: 120 mg/dl COMPARISON:  16/08/9603 FINDINGS: Mediastinal blood pool activity: SUV max 2.9 HEAD/NECK: No hypermetabolic activity in the scalp. No hypermetabolic cervical lymph nodes.  Incidental CT findings: none CHEST: No hypermetabolic mediastinal or hilar nodes. No suspicious pulmonary nodules on the CT scan. Incidental CT findings: Coronary artery calcification is evident. Moderate atherosclerotic calcification is noted in the wall of the thoracic aorta. ABDOMEN/PELVIS: No abnormal hypermetabolic activity within the liver, pancreas, adrenal glands, or spleen. No hypermetabolic lymph nodes in the abdomen or pelvis. Incidental CT findings: Tiny cyst inferior right liver is unchanged in the interval. There is moderate atherosclerotic calcification of the abdominal aorta without aneurysm. Status post ventral hernia repair with mesh placement. Left colonic diverticulosis without diverticulitis. Small left groin hernia contains only fat. SKELETON: No focal hypermetabolic activity to suggest skeletal metastasis. Incidental CT findings: Lumbosacral fusion hardware evident. EXTREMITIES: No abnormal hypermetabolic activity in the lower extremities. Incidental CT findings: none IMPRESSION: 1. Stable exam. No hypermetabolic disease on today's study. Specifically, no findings to suggest hypermetabolic myeloma. 2. Left colonic diverticulosis without diverticulitis. 3. Small left groin hernia contains only fat. 4.  Aortic Atherosclerosis (ICD10-I70.0). Electronically Signed   By: Kennith Center M.D.   On: 04/05/2023 11:39     ASSESSMENT & PLAN:   ERBY CANSECO is a 82 y.o. caucasian male with    1. IgG Lambda smoldering myeloma He has a M-spike of 0.8g/dl and normal SFLC and ratio. No anemia - nl Hgb No focal bone pains --- Skeletal survey - shows no  No hypercalcemia. 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  05/10/18 Metastatic Bone Survey revealed no lytic lesions on skeletal survey to suggest multiple myeloma.   2. 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 Signe Colt  -mild non  nephrotic proteinuria on 24h UPEP-Continue follow up with Dr Signe Colt for CKD    3. Vitamin B12 deficient  Antibody testing done - no evidence of pernicious anemia. No evidence of pernicious anemia based on neg IF and parietal cell ab -continue B12 replacement per PCP  4.  Past Medical History:  Diagnosis Date   Arthritis    back   Cancer [facility])    prostate, sees Dr. Heloise Purpura    Chronic kidney disease 2017   stage 3 kidney disease   Diabetes mellitus    Erectile dysfunction    GERD (gastroesophageal reflux disease)    Hyperlipidemia    Hypertension    Migraines    Peptic ulcer    Thyroid disease    PLAN:  -Discussed lab results on 03/24/2023 in detail with patient. CBC showed WBC of 4.3K, hemoglobin of 11.4, and platelets of 197K. -CMP shows chronic kidney disease with creatinine of 1.67  Myeloma panel M spike stable at 2.6g/dl SFLC stable - no labs or clinical evidence of progression to active myeloma. -PET/CT 04/01/2023 no findings to suggest hypermetabolic myeloma.  -Continue vitamin D 2000 units daily    FOLLOW UP: Labs in 14 weeks Phone visit with Dr Candise Che in 16 weeks  The total time spent in the appointment was 20 minutes* .  All of the patient's questions were answered with apparent satisfaction. The patient knows to call the clinic with any problems, questions or concerns.   Wyvonnia Lora MD MS AAHIVMS [facility] Hematology/Oncology Physician [facility]  .*Total Encounter Time as defined by the Centers for Medicare and Medicaid Services includes, in addition to the face-to-face time of a patient visit (documented in the note above) non-face-to-face time: obtaining and reviewing outside history, ordering and reviewing medications, tests or procedures, care coordination (communications with other health care professionals or caregivers) and documentation in the medical record.    I,Mitra Faeizi,acting as a Neurosurgeon for Wyvonnia Lora, MD.,have documented all  relevant documentation on the behalf of Wyvonnia Lora, MD,as directed by  Wyvonnia Lora, MD while in the presence of Wyvonnia Lora, MD.  .I have reviewed the above documentation for accuracy and completeness, and I agree with the above. [name]

## 2023-04-14 ENCOUNTER — Telehealth: Payer: Self-pay | Admitting: Hematology

## 2023-06-04 ENCOUNTER — Other Ambulatory Visit: Payer: Self-pay | Admitting: Family Medicine

## 2023-06-04 DIAGNOSIS — E119 Type 2 diabetes mellitus without complications: Secondary | ICD-10-CM

## 2023-06-10 DIAGNOSIS — H53143 Visual discomfort, bilateral: Secondary | ICD-10-CM | POA: Diagnosis not present

## 2023-06-10 DIAGNOSIS — E119 Type 2 diabetes mellitus without complications: Secondary | ICD-10-CM | POA: Diagnosis not present

## 2023-06-10 DIAGNOSIS — H25813 Combined forms of age-related cataract, bilateral: Secondary | ICD-10-CM | POA: Diagnosis not present

## 2023-06-10 DIAGNOSIS — H04123 Dry eye syndrome of bilateral lacrimal glands: Secondary | ICD-10-CM | POA: Diagnosis not present

## 2023-06-26 ENCOUNTER — Other Ambulatory Visit: Payer: Self-pay | Admitting: Family Medicine

## 2023-06-26 DIAGNOSIS — Z0000 Encounter for general adult medical examination without abnormal findings: Secondary | ICD-10-CM

## 2023-06-29 DIAGNOSIS — Z85828 Personal history of other malignant neoplasm of skin: Secondary | ICD-10-CM | POA: Diagnosis not present

## 2023-06-29 DIAGNOSIS — C44622 Squamous cell carcinoma of skin of right upper limb, including shoulder: Secondary | ICD-10-CM | POA: Diagnosis not present

## 2023-06-29 DIAGNOSIS — D485 Neoplasm of uncertain behavior of skin: Secondary | ICD-10-CM | POA: Diagnosis not present

## 2023-06-29 DIAGNOSIS — Z85820 Personal history of malignant melanoma of skin: Secondary | ICD-10-CM | POA: Diagnosis not present

## 2023-06-30 NOTE — Telephone Encounter (Signed)
Pt is calling back about his onetouch verio test strip he stated he only have 4 left .

## 2023-07-02 NOTE — Telephone Encounter (Signed)
Pt is checking his blood sugar twice a day and per pt only #30 comes in bottle and was given only 2 bottles which was 60 each.ONETOUCH VERIO test strip . Pt is aware md out of office this afternoon  [id number] - [place], [place] - [address] Phone: [phone]  Fax: [phone]

## 2023-07-15 ENCOUNTER — Other Ambulatory Visit: Payer: Self-pay

## 2023-07-15 ENCOUNTER — Telehealth: Payer: Self-pay | Admitting: Family Medicine

## 2023-07-15 DIAGNOSIS — E119 Type 2 diabetes mellitus without complications: Secondary | ICD-10-CM

## 2023-07-15 MED ORDER — ONETOUCH VERIO VI STRP
ORAL_STRIP | 0 refills | Status: DC
Start: 2023-07-15 — End: 2023-08-16

## 2023-07-15 NOTE — Telephone Encounter (Signed)
Requesting rx for [facility] VERIO test strip be sent to [id number] [place], [place] - [address] Phone: [phone]  Fax: [phone]   Suggested he call to have rx switched

## 2023-07-15 NOTE — Telephone Encounter (Signed)
Rx sent 

## 2023-07-21 ENCOUNTER — Other Ambulatory Visit: Payer: Self-pay

## 2023-07-21 DIAGNOSIS — D472 Monoclonal gammopathy: Secondary | ICD-10-CM

## 2023-07-22 ENCOUNTER — Inpatient Hospital Stay: Payer: PPO | Attending: Hematology

## 2023-07-22 DIAGNOSIS — C9000 Multiple myeloma not having achieved remission: Secondary | ICD-10-CM | POA: Insufficient documentation

## 2023-07-22 DIAGNOSIS — D472 Monoclonal gammopathy: Secondary | ICD-10-CM

## 2023-07-22 LAB — CBC WITH DIFFERENTIAL (CANCER CENTER ONLY)
Abs Immature Granulocytes: 0.01 10*3/uL (ref 0.00–0.07)
Basophils Absolute: 0 10*3/uL (ref 0.0–0.1)
Basophils Relative: 1 %
Eosinophils Absolute: 0.2 10*3/uL (ref 0.0–0.5)
Eosinophils Relative: 4 %
HCT: 34.5 % — ABNORMAL LOW (ref 39.0–52.0)
Hemoglobin: 11.3 g/dL — ABNORMAL LOW (ref 13.0–17.0)
Immature Granulocytes: 0 %
Lymphocytes Relative: 42 %
Lymphs Abs: 1.6 10*3/uL (ref 0.7–4.0)
MCH: 31.7 pg (ref 26.0–34.0)
MCHC: 32.8 g/dL (ref 30.0–36.0)
MCV: 96.6 fL (ref 80.0–100.0)
Monocytes Absolute: 0.4 10*3/uL (ref 0.1–1.0)
Monocytes Relative: 11 %
Neutro Abs: 1.6 10*3/uL — ABNORMAL LOW (ref 1.7–7.7)
Neutrophils Relative %: 42 %
Platelet Count: 208 10*3/uL (ref 150–400)
RBC: 3.57 MIL/uL — ABNORMAL LOW (ref 4.22–5.81)
RDW: 13.7 % (ref 11.5–15.5)
WBC Count: 3.8 10*3/uL — ABNORMAL LOW (ref 4.0–10.5)
nRBC: 0 % (ref 0.0–0.2)

## 2023-07-22 LAB — CMP (CANCER CENTER ONLY)
ALT: 12 U/L (ref 0–44)
AST: 11 U/L — ABNORMAL LOW (ref 15–41)
Albumin: 3.3 g/dL — ABNORMAL LOW (ref 3.5–5.0)
Alkaline Phosphatase: 71 U/L (ref 38–126)
Anion gap: 7 (ref 5–15)
BUN: 36 mg/dL — ABNORMAL HIGH (ref 8–23)
CO2: 22 mmol/L (ref 22–32)
Calcium: 9.2 mg/dL (ref 8.9–10.3)
Chloride: 107 mmol/L (ref 98–111)
Creatinine: 1.46 mg/dL — ABNORMAL HIGH (ref 0.61–1.24)
GFR, Estimated: 48 mL/min — ABNORMAL LOW (ref >60)
Glucose, Bld: 80 mg/dL (ref 70–99)
Potassium: 4.5 mmol/L (ref 3.5–5.1)
Sodium: 136 mmol/L (ref 135–145)
Total Bilirubin: 0.4 mg/dL (ref 0.3–1.2)
Total Protein: 9.7 g/dL — ABNORMAL HIGH (ref 6.5–8.1)

## 2023-07-23 LAB — KAPPA/LAMBDA LIGHT CHAINS
Kappa free light chain: 14.7 mg/L (ref 3.3–19.4)
Kappa, lambda light chain ratio: 0.16 — ABNORMAL LOW (ref 0.26–1.65)
Lambda free light chains: 92.1 mg/L — ABNORMAL HIGH (ref 5.7–26.3)

## 2023-07-28 LAB — MULTIPLE MYELOMA PANEL, SERUM
Albumin SerPl Elph-Mcnc: 3.5 g/dL (ref 2.9–4.4)
Albumin/Glob SerPl: 0.7 (ref 0.7–1.7)
Alpha 1: 0.3 g/dL (ref 0.0–0.4)
Alpha2 Glob SerPl Elph-Mcnc: 1 g/dL (ref 0.4–1.0)
B-Globulin SerPl Elph-Mcnc: 0.9 g/dL (ref 0.7–1.3)
Gamma Glob SerPl Elph-Mcnc: 3.1 g/dL — ABNORMAL HIGH (ref 0.4–1.8)
Globulin, Total: 5.2 g/dL — ABNORMAL HIGH (ref 2.2–3.9)
IgA: 54 mg/dL — ABNORMAL LOW (ref 61–437)
IgG (Immunoglobin G), Serum: 4177 mg/dL — ABNORMAL HIGH (ref 603–1613)
IgM (Immunoglobulin M), Srm: 18 mg/dL (ref 15–143)
M Protein SerPl Elph-Mcnc: 2.8 g/dL — ABNORMAL HIGH
Total Protein ELP: 8.7 g/dL — ABNORMAL HIGH (ref 6.0–8.5)

## 2023-08-03 ENCOUNTER — Inpatient Hospital Stay: Payer: PPO | Attending: Hematology | Admitting: Hematology

## 2023-08-03 DIAGNOSIS — R7989 Other specified abnormal findings of blood chemistry: Secondary | ICD-10-CM | POA: Diagnosis not present

## 2023-08-03 DIAGNOSIS — C9000 Multiple myeloma not having achieved remission: Secondary | ICD-10-CM | POA: Diagnosis not present

## 2023-08-03 DIAGNOSIS — R5383 Other fatigue: Secondary | ICD-10-CM | POA: Diagnosis not present

## 2023-08-03 DIAGNOSIS — I129 Hypertensive chronic kidney disease with stage 1 through stage 4 chronic kidney disease, or unspecified chronic kidney disease: Secondary | ICD-10-CM | POA: Insufficient documentation

## 2023-08-03 DIAGNOSIS — D472 Monoclonal gammopathy: Secondary | ICD-10-CM | POA: Diagnosis not present

## 2023-08-03 DIAGNOSIS — N183 Chronic kidney disease, stage 3 unspecified: Secondary | ICD-10-CM | POA: Diagnosis not present

## 2023-08-03 DIAGNOSIS — E1122 Type 2 diabetes mellitus with diabetic chronic kidney disease: Secondary | ICD-10-CM | POA: Diagnosis not present

## 2023-08-03 DIAGNOSIS — R944 Abnormal results of kidney function studies: Secondary | ICD-10-CM | POA: Insufficient documentation

## 2023-08-03 DIAGNOSIS — E538 Deficiency of other specified B group vitamins: Secondary | ICD-10-CM | POA: Insufficient documentation

## 2023-08-03 NOTE — Progress Notes (Signed)
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 male who was contacted via phone for continued evaluation and management of multiple myeloma.   I connected with Troy Howard on 08/03/23 at  8:40 AM EDT by telephone visit and verified that I am speaking with the correct person using two identifiers.   I last connected with the patient via phone on 04/07/2023 and he was doing well overall.   Patient notes he has been doing well overall since our last visit. He complains of increased fatigue since our last visit. He denies any new infection issues, fever, chills, night sweats, bone pain, back pain, chest pain, or leg swelling.   I discussed lab results from 07/22/2023.  I discussed the limitations, risks, security and privacy concerns of performing an evaluation and management service by telemedicine and the availability of in-person appointments. I also discussed with the patient that there may be a patient responsible charge related to this service. The patient expressed understanding and agreed to proceed.   Other persons participating in the visit and their role in the encounter: none   Patient's location: home  Provider's location: [facility]   Chief Complaint: evaluation and management of multiple myeloma.    MEDICAL HISTORY:  Past Medical History:  Diagnosis Date   Arthritis    back   Cancer [facility])    prostate, sees Dr. Heloise Purpura    Chronic kidney disease 2017   stage 3 kidney disease   Diabetes mellitus    Erectile dysfunction    GERD (gastroesophageal reflux  disease)    Hyperlipidemia    Hypertension    Migraines    Peptic ulcer    Thyroid disease     SURGICAL HISTORY: Past Surgical History:  Procedure Laterality Date   APPENDECTOMY  1950   COLONOSCOPY  05/22/2021   per Dr. Russella Dar, benign polyps, no repeats needed   HERNIA REPAIR     umbilical    MENISCUS REPAIR Left    PROSTATE SURGERY  2009   radical prostatectomy   SPINE SURGERY     L4-L5 fusion per Dr. Trey Sailors    TONSILLECTOMY AND ADENOIDECTOMY  1954    SOCIAL HISTORY: Social History   Socioeconomic History   Marital status: Married    Spouse name: Not on file   Number of children: Not on file   Years of education: Not on file   Highest education level: Some college, no degree  Occupational History   Not on file  Tobacco Use   Smoking status: Former    Current packs/day: 0.00    Average packs/day: 0.3 packs/day for 5.0 years (1.3 ttl pk-yrs)    Types: Cigarettes    Start date: 60    Quit date: 59    Years since quitting: 57.7   Smokeless tobacco: Never  Vaping Use   Vaping status: Never Used  Substance and Sexual Activity   Alcohol use: Not Currently    Comment: rare one glass every 6 months   Drug use: No   Sexual activity: Yes  Other Topics Concern   Not on file  Social History Narrative   Not on file   Social Determinants of Health   Financial Resource Strain: Low Risk  (07/14/2022)   Overall Financial Resource Strain (CARDIA)    Difficulty of Paying Living Expenses: Not hard at all  Food Insecurity: No Food Insecurity (03/15/2023)   Hunger Vital Sign    Worried About Running Out of Food in the Last Year: Never true    Ran Out of Food in the Last Year: Never true  Transportation Needs: No Transportation Needs (03/15/2023)   PRAPARE - Administrator, Civil Service (Medical): No    Lack of Transportation (Non-Medical): No  Physical Activity: Insufficiently Active (03/15/2023)   Exercise Vital Sign    Days of Exercise per Week: 3 days     Minutes of Exercise per Session: 20 min  Stress: Stress Concern Present (03/15/2023)   [employer] of Occupational Health - Occupational Stress Questionnaire    Feeling of Stress : To some extent  Social Connections: Moderately Isolated (03/15/2023)   Social Connection and Isolation Panel [NHANES]    Frequency of Communication with Friends and Family: Three times a week    Frequency of Social Gatherings with Friends and Family: Once a week    Attends Religious Services: Never    Database administrator or Organizations: No    Attends Engineer, structural: Not on file    Marital Status: Married  Catering manager Violence: Not At Risk (01/15/2022)   Humiliation, Afraid, Rape, and Kick questionnaire    Fear of Current or Ex-Partner: No    Emotionally Abused: No    Physically Abused: No    Sexually Abused: No    FAMILY HISTORY: Family History  Problem Relation Age of Onset   Hypertension Other    Cancer Other    Colon cancer Neg Hx    Colon polyps Neg Hx    Esophageal cancer Neg Hx    Rectal cancer Neg Hx    Stomach cancer Neg Hx     ALLERGIES:  has No Known Allergies.  MEDICATIONS:  Current Outpatient Medications  Medication Sig Dispense Refill   aspirin-acetaminophen-caffeine (EXCEDRIN MIGRAINE) 250-250-65 MG tablet Take 1 tablet by mouth every 8 (eight) hours as needed for migraine.     atorvastatin (LIPITOR) 10 MG tablet Take 1 tablet (10 mg total) by mouth daily. 90 tablet 3   Blood Glucose Monitoring Suppl (ONE TOUCH ULTRA 2) w/Device KIT Use to check blood sugars once daily. 1 kit 0   canagliflozin (INVOKANA) 300 MG TABS tablet Take 1 tablet (300 mg total) by mouth daily before breakfast. 90 tablet 3   CHELATED ZINC PO Take 1 capsule by mouth daily.     Cholecalciferol (VITAMIN D3) 2000 units capsule Take 2,000 Units by mouth daily.      Cyanocobalamin (VITAMIN B 12 PO) Take 2,500 mcg by mouth daily.      esomeprazole (NEXIUM) 40 MG capsule Take 1 capsule  (40 mg total) by mouth daily. 90 capsule 3   FOLIC ACID PO Take 800 mcg by mouth daily.      glipiZIDE (GLUCOTROL) 10 MG tablet TAKE 1 TABLET (10 MG TOTAL) BY MOUTH TWICE A DAY BEFORE A MEAL 180 tablet 1   glucose blood (ONETOUCH VERIO) test strip USE ONCE A DAY TO TEST BLOOD SUGAR 100 strip 0   GNP GARLIC EXTRACT PO Take 800 mg by mouth daily.     Levomefolate Glucosamine (METHYLFOLATE PO) Take 1 tablet by mouth  daily.     olmesartan (BENICAR) 40 MG tablet Take 1 tablet (40 mg total) by mouth daily. 90 tablet 3   Omega-3 Fatty Acids (FISH OIL) 1000 MG CAPS Take by mouth daily.      OneTouch Delica Lancets 30G MISC USE ONCE A DAY WHEN CHECKING BLOOD SUGAR 100 each 3   pyridOXINE (VITAMIN B-6) 100 MG tablet Take 100 mg by mouth daily.     SYNTHROID 137 MCG tablet Take 1 tablet (137 mcg total) by mouth daily before breakfast. 90 tablet 3   temazepam (RESTORIL) 30 MG capsule Take 1 capsule (30 mg total) by mouth at bedtime as needed for sleep. 30 capsule 5   Current Facility-Administered Medications  Medication Dose Route Frequency Provider Last Rate Last Admin   0.9 %  sodium chloride infusion  500 mL Intravenous Once Meryl Dare, MD        REVIEW OF SYSTEMS:    10 Point review of Systems was done is negative except as noted above.   PHYSICAL EXAMINATION: Telemedicine visit  LABORATORY DATA:  I have reviewed the data as listed  .    Latest Ref Rng & Units 07/22/2023    7:28 AM 03/24/2023    8:04 AM 12/08/2022    8:42 AM  CBC  WBC 4.0 - 10.5 K/uL 3.8  4.3  3.9   Hemoglobin 13.0 - 17.0 g/dL 16.1  09.6  04.5   Hematocrit 39.0 - 52.0 % 34.5  34.4  33.5   Platelets 150 - 400 K/uL 208  197  200    . CBC    Component Value Date/Time   WBC 3.8 (L) 07/22/2023 0728   WBC 4.9 10/30/2022 0859   RBC 3.57 (L) 07/22/2023 0728   HGB 11.3 (L) 07/22/2023 0728   HCT 34.5 (L) 07/22/2023 0728   PLT 208 07/22/2023 0728   MCV 96.6 07/22/2023 0728   MCH 31.7 07/22/2023 0728   MCHC 32.8  07/22/2023 0728   RDW 13.7 07/22/2023 0728   LYMPHSABS 1.6 07/22/2023 0728   MONOABS 0.4 07/22/2023 0728   EOSABS 0.2 07/22/2023 0728   BASOSABS 0.0 07/22/2023 0728    .    Latest Ref Rng & Units 07/22/2023    7:28 AM 03/24/2023    8:04 AM 12/08/2022    8:42 AM  CMP  Glucose 70 - 99 mg/dL 80  81  409   BUN 8 - 23 mg/dL 36  29  34   Creatinine 0.61 - 1.24 mg/dL 8.11  9.14  7.82   Sodium 135 - 145 mmol/L 136  136  134   Potassium 3.5 - 5.1 mmol/L 4.5  4.7  4.5   Chloride 98 - 111 mmol/L 107  106  105   CO2 22 - 32 mmol/L 22  26  24    Calcium 8.9 - 10.3 mg/dL 9.2  9.2  9.1   Total Protein 6.5 - 8.1 g/dL 9.7  9.3  9.3   Total Bilirubin 0.3 - 1.2 mg/dL 0.4  0.4  0.5   Alkaline Phos 38 - 126 U/L 71  80  90   AST 15 - 41 U/L 11  10  11    ALT 0 - 44 U/L 12  12  13      03/27/2021 BM Bx   DIAGNOSIS:   BONE MARROW, ASPIRATE, CLOT, CORE:  -  Hypercellular bone marrow with involvement by plasma cell neoplasm  -  See comment    COMMENT:   Plasma  cells are increased on aspirate smears (8% by manual differential  count) and by CD138 immunohistochemistry on the core biopsy and clot  section (15%).  Plasma cells express lambda-restricted light chains by  in situ hybridization.  Together the findings are consistent with bone  marrow involvement by a lambda-restricted plasma cell neoplasm.  Correlation with clinical impressions, radiographic studies, other  laboratory data, and cytogenetic/FISH results is recommended.   03/27/2021 Molecular Pathology    03/27/2021 Cytogenetics    RADIOGRAPHIC STUDIES: I have personally reviewed the radiological images as listed and agreed with the findings in the report. No results found.   ASSESSMENT & PLAN:   Troy Howard is a 82 y.o. caucasian male with    1. IgG Lambda smoldering myeloma He has a M-spike of 0.8g/dl and normal SFLC and ratio. No anemia - nl Hgb No focal bone pains --- Skeletal survey - shows no  No  hypercalcemia. 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  05/10/18 Metastatic Bone Survey revealed no lytic lesions on skeletal survey to suggest multiple myeloma.   2. 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 Signe Colt  -mild non nephrotic proteinuria on 24h UPEP-Continue follow up with Dr Signe Colt for CKD    3. Vitamin B12 deficient  Antibody testing done - no evidence of pernicious anemia. No evidence of pernicious anemia based on neg IF and parietal cell ab -continue B12 replacement per PCP  4.  Past Medical History:  Diagnosis Date   Arthritis    back   Cancer [facility])    prostate, sees Dr. Heloise Purpura    Chronic kidney disease 2017   stage 3 kidney disease   Diabetes mellitus    Erectile dysfunction    GERD (gastroesophageal reflux disease)    Hyperlipidemia    Hypertension    Migraines    Peptic ulcer    Thyroid disease    PLAN:  -Discussed lab results from 07/22/2023 with the patient. CBC shows slightly decreased hemoglobin of 11.3 g/dL and decreased hematocrit of 34.5%. CMP shows elevated BUN of 36 mg/dL, elevated creatinine but stable at 1.46, and slightly decreased AST of 11.  -Discussed multiple myeloma panel results from 07/22/2023 with the patient. Showed elevated M-protein of 2.8, increased from 2.6.  -Discussed Kappa/Lambda light chain results from 07/22/2023 with the patient. Showed elevated lambda light chain ration of 92.1 mg/L and decreased Kappa/lambda light chain ration of 0.16.  -Discussed the option of repeat bone marrow biopsy prior to the next visit in 3 months due to increased fatigue and gradual increased of M-protein.   -Continue vitamin D 2000 units daily    FOLLOW UP: CT bone marrow aspiration and biopsy in 10 weeks Labs in 10 weeks Return to clinic with Dr. Candise Che in 12 weeks  The total time spent in the appointment was 20 minutes* .  All of the  patient's questions were answered with apparent satisfaction. The patient knows to call the clinic with any problems, questions or concerns.   Wyvonnia Lora MD MS AAHIVMS [facility] Hematology/Oncology Physician [facility]  .*Total Encounter Time as defined by the Centers for Medicare and Medicaid Services includes, in addition to the face-to-face time of a patient visit (documented in the note above) non-face-to-face time: obtaining and reviewing outside history, ordering and reviewing medications, tests or procedures, care coordination (communications with other  health care professionals or caregivers) and documentation in the medical record.   I,Param Shah,acting as a Neurosurgeon for Wyvonnia Lora, MD.,have documented all relevant documentation on the behalf of Wyvonnia Lora, MD,as directed by  Wyvonnia Lora, MD while in the presence of Wyvonnia Lora, MD.  .I have reviewed the above documentation for accuracy and completeness, and I agree with the above. [name]

## 2023-08-16 ENCOUNTER — Other Ambulatory Visit: Payer: Self-pay | Admitting: Family Medicine

## 2023-08-16 DIAGNOSIS — E119 Type 2 diabetes mellitus without complications: Secondary | ICD-10-CM

## 2023-09-22 ENCOUNTER — Other Ambulatory Visit: Payer: Self-pay | Admitting: Family Medicine

## 2023-10-11 ENCOUNTER — Inpatient Hospital Stay: Payer: PPO | Attending: Hematology

## 2023-10-11 ENCOUNTER — Other Ambulatory Visit: Payer: Self-pay | Admitting: Radiology

## 2023-10-11 DIAGNOSIS — D472 Monoclonal gammopathy: Secondary | ICD-10-CM

## 2023-10-11 DIAGNOSIS — C9000 Multiple myeloma not having achieved remission: Secondary | ICD-10-CM | POA: Insufficient documentation

## 2023-10-11 LAB — CBC WITH DIFFERENTIAL (CANCER CENTER ONLY)
Abs Immature Granulocytes: 0.01 10*3/uL (ref 0.00–0.07)
Basophils Absolute: 0 10*3/uL (ref 0.0–0.1)
Basophils Relative: 0 %
Eosinophils Absolute: 0.1 10*3/uL (ref 0.0–0.5)
Eosinophils Relative: 2 %
HCT: 35.2 % — ABNORMAL LOW (ref 39.0–52.0)
Hemoglobin: 11.7 g/dL — ABNORMAL LOW (ref 13.0–17.0)
Immature Granulocytes: 0 %
Lymphocytes Relative: 44 %
Lymphs Abs: 1.8 10*3/uL (ref 0.7–4.0)
MCH: 31.9 pg (ref 26.0–34.0)
MCHC: 33.2 g/dL (ref 30.0–36.0)
MCV: 95.9 fL (ref 80.0–100.0)
Monocytes Absolute: 0.5 10*3/uL (ref 0.1–1.0)
Monocytes Relative: 11 %
Neutro Abs: 1.7 10*3/uL (ref 1.7–7.7)
Neutrophils Relative %: 43 %
Platelet Count: 227 10*3/uL (ref 150–400)
RBC: 3.67 MIL/uL — ABNORMAL LOW (ref 4.22–5.81)
RDW: 13.8 % (ref 11.5–15.5)
WBC Count: 4 10*3/uL (ref 4.0–10.5)
nRBC: 0 % (ref 0.0–0.2)

## 2023-10-11 LAB — CMP (CANCER CENTER ONLY)
ALT: 10 U/L (ref 0–44)
AST: 11 U/L — ABNORMAL LOW (ref 15–41)
Albumin: 3.7 g/dL (ref 3.5–5.0)
Alkaline Phosphatase: 64 U/L (ref 38–126)
Anion gap: 5 (ref 5–15)
BUN: 30 mg/dL — ABNORMAL HIGH (ref 8–23)
CO2: 26 mmol/L (ref 22–32)
Calcium: 9.1 mg/dL (ref 8.9–10.3)
Chloride: 103 mmol/L (ref 98–111)
Creatinine: 1.75 mg/dL — ABNORMAL HIGH (ref 0.61–1.24)
GFR, Estimated: 39 mL/min — ABNORMAL LOW (ref >60)
Glucose, Bld: 130 mg/dL — ABNORMAL HIGH (ref 70–99)
Potassium: 4.5 mmol/L (ref 3.5–5.1)
Sodium: 134 mmol/L — ABNORMAL LOW (ref 135–145)
Total Bilirubin: 0.4 mg/dL (ref <1.2)
Total Protein: 9.5 g/dL — ABNORMAL HIGH (ref 6.5–8.1)

## 2023-10-11 LAB — SEDIMENTATION RATE: Sed Rate: 115 mm/h — ABNORMAL HIGH (ref 0–16)

## 2023-10-11 NOTE — H&P (Signed)
Referring Physician(s): [name]  Supervising Physician: Irish Lack  Patient Status:  [facility] OP  Chief Complaint: " I am here for a bone marrow biopsy"   Subjective: Pt known to IR team from bone marrow biopsy on 03/27/2021.  He is an 82 year old male with past medical history significant for arthritis, chronic kidney disease, diabetes, GERD, hypertension, hyperlipidemia, migraines, peptic ulcer, hypothyroidism, vitamin B12 deficiency and IgG lambda smoldering myeloma.  Patient presents now with increase of M protein along with increased fatigue.  He is scheduled today for follow-up CT-guided bone marrow biopsy to evaluate for myeloma progression.   Past Medical History:  Diagnosis Date   Arthritis    back   Cancer [facility])    prostate, sees Dr. Heloise Purpura    Chronic kidney disease 2017   stage 3 kidney disease   Diabetes mellitus    Erectile dysfunction    GERD (gastroesophageal reflux disease)    Hyperlipidemia    Hypertension    Migraines    Peptic ulcer    Thyroid disease    Past Surgical History:  Procedure Laterality Date   APPENDECTOMY  1950   COLONOSCOPY  05/22/2021   per Dr. Russella Dar, benign polyps, no repeats needed   HERNIA REPAIR     umbilical    MENISCUS REPAIR Left    PROSTATE SURGERY  2009   radical prostatectomy   SPINE SURGERY     L4-L5 fusion per Dr. Trey Sailors    TONSILLECTOMY AND ADENOIDECTOMY  1954      Allergies: Patient has no known allergies.  Medications: Prior to Admission medications   Medication Sig Start Date End Date Taking? Authorizing Provider  aspirin-acetaminophen-caffeine (EXCEDRIN MIGRAINE) [phone] MG tablet Take 1 tablet by mouth every 8 (eight) hours as needed for migraine.    [provider]  atorvastatin (LIPITOR) 10 MG tablet Take 1 tablet (10 mg total) by mouth daily. 10/30/22   Nelwyn Salisbury, MD  Blood Glucose Monitoring Suppl (ONE TOUCH ULTRA 2) w/Device KIT Use to check blood sugars once  daily. 11/12/20   Nelwyn Salisbury, MD  canagliflozin [facility]) 300 MG TABS tablet Take 1 tablet (300 mg total) by mouth daily before breakfast. 10/30/22   Nelwyn Salisbury, MD  CHELATED ZINC PO Take 1 capsule by mouth daily.    [provider]  Cholecalciferol (VITAMIN D3) 2000 units capsule Take 2,000 Units by mouth daily.     [provider]  Cyanocobalamin (VITAMIN B 12 PO) Take 2,500 mcg by mouth daily.     [provider]  esomeprazole (NEXIUM) 40 MG capsule Take 1 capsule (40 mg total) by mouth daily. 10/30/22   Nelwyn Salisbury, MD  FOLIC ACID PO Take 800 mcg by mouth daily.     [provider]  glipiZIDE (GLUCOTROL) 10 MG tablet TAKE 1 TABLET (10 MG TOTAL) BY MOUTH TWICE A DAY BEFORE A MEAL 06/28/23   Nelwyn Salisbury, MD  glucose blood (ONETOUCH VERIO) test strip USE ONCE A DAY TO TEST BLOOD SUGAR 08/16/23   Nelwyn Salisbury, MD  GNP GARLIC EXTRACT PO Take 800 mg by mouth daily.    [provider]  Levomefolate Glucosamine (METHYLFOLATE PO) Take 1 tablet by mouth daily.    [provider]  olmesartan (BENICAR) 40 MG tablet Take 1 tablet (40 mg total) by mouth daily. 10/30/22   Nelwyn Salisbury, MD  Omega-3 Fatty Acids (FISH OIL) 1000 MG CAPS Take by mouth daily.  [provider]  OneTouch Delica Lancets 30G MISC USE ONCE A DAY WHEN CHECKING BLOOD SUGAR 11/12/20   Nelwyn Salisbury, MD  pyridOXINE (VITAMIN B-6) 100 MG tablet Take 100 mg by mouth daily.    [provider]  SYNTHROID 137 MCG tablet Take 1 tablet (137 mcg total) by mouth daily before breakfast. 10/30/22   Nelwyn Salisbury, MD  temazepam (RESTORIL) 30 MG capsule TAKE 1 CAPSULE BY MOUTH AT BEDTIME AS NEEDED FOR SLEEP. 09/24/23   Nelwyn Salisbury, MD     Vital Signs:   Code Status:   Physical Exam  Imaging: No results found.  Labs:  CBC: Recent Labs    12/08/22 0842 03/24/23 0804 07/22/23 0728 10/11/23 0755  WBC 3.9* 4.3 3.8* 4.0  HGB 11.6* 11.4* 11.3*  11.7*  HCT 33.5* 34.4* 34.5* 35.2*  PLT 200 197 208 227    COAGS: No results for input(s): "INR", "APTT" in the last 8760 hours.  BMP: Recent Labs    12/08/22 0842 03/24/23 0804 07/22/23 0728 10/11/23 0755  NA 134* 136 136 134*  K 4.5 4.7 4.5 4.5  CL 105 106 107 103  CO2 24 26 22 26   GLUCOSE 100* 81 80 130*  BUN 34* 29* 36* 30*  CALCIUM 9.1 9.2 9.2 9.1  CREATININE 1.61* 1.67* 1.46* 1.75*  GFRNONAA 43* 41* 48* 39*    LIVER FUNCTION TESTS: Recent Labs    12/08/22 0842 03/24/23 0804 07/22/23 0728 10/11/23 0755  BILITOT 0.5 0.4 0.4 0.4  AST 11* 10* 11* 11*  ALT 13 12 12 10   ALKPHOS 90 80 71 64  PROT 9.3* 9.3* 9.7* 9.5*  ALBUMIN 3.7 3.7 3.3* 3.7    Assessment and Plan: 82 year old male with past medical history significant for arthritis, chronic kidney disease, diabetes, GERD, hypertension, hyperlipidemia, migraines, peptic ulcer, hypothyroidism, vitamin B12 deficiency and IgG lambda smoldering myeloma.  Patient presents now with increase of M protein along with increased fatigue.  He is scheduled today for follow-up CT-guided bone marrow biopsy to evaluate for myeloma progression.Risks and benefits of procedure was discussed with the patient  including, but not limited to bleeding, infection, damage to adjacent structures or low yield requiring additional tests.  All of the questions were answered and there is agreement to proceed.  Consent signed and in chart.    Electronically Signed: D. Jeananne Rama, PA-C 10/11/2023, 2:47 PM   I spent a total of 20 minutes at the the patient's bedside AND on the patient's hospital floor or unit, greater than 50% of which was counseling/coordinating care for CT-guided bone marrow biopsy

## 2023-10-12 ENCOUNTER — Ambulatory Visit (HOSPITAL_COMMUNITY)
Admission: RE | Admit: 2023-10-12 | Discharge: 2023-10-12 | Disposition: A | Discharge status: Home / Self Care | Payer: PPO | Source: Ambulatory Visit | Attending: Hematology | Admitting: Hematology

## 2023-10-12 ENCOUNTER — Encounter (HOSPITAL_COMMUNITY): Payer: Self-pay

## 2023-10-12 DIAGNOSIS — D72819 Decreased white blood cell count, unspecified: Secondary | ICD-10-CM | POA: Diagnosis not present

## 2023-10-12 DIAGNOSIS — E785 Hyperlipidemia, unspecified: Secondary | ICD-10-CM | POA: Insufficient documentation

## 2023-10-12 DIAGNOSIS — E538 Deficiency of other specified B group vitamins: Secondary | ICD-10-CM | POA: Diagnosis not present

## 2023-10-12 DIAGNOSIS — D539 Nutritional anemia, unspecified: Secondary | ICD-10-CM | POA: Insufficient documentation

## 2023-10-12 DIAGNOSIS — C9000 Multiple myeloma not having achieved remission: Secondary | ICD-10-CM | POA: Diagnosis not present

## 2023-10-12 DIAGNOSIS — E039 Hypothyroidism, unspecified: Secondary | ICD-10-CM | POA: Insufficient documentation

## 2023-10-12 DIAGNOSIS — L570 Actinic keratosis: Secondary | ICD-10-CM | POA: Diagnosis not present

## 2023-10-12 DIAGNOSIS — I129 Hypertensive chronic kidney disease with stage 1 through stage 4 chronic kidney disease, or unspecified chronic kidney disease: Secondary | ICD-10-CM | POA: Insufficient documentation

## 2023-10-12 DIAGNOSIS — Z85820 Personal history of malignant melanoma of skin: Secondary | ICD-10-CM | POA: Diagnosis not present

## 2023-10-12 DIAGNOSIS — E1122 Type 2 diabetes mellitus with diabetic chronic kidney disease: Secondary | ICD-10-CM | POA: Diagnosis not present

## 2023-10-12 DIAGNOSIS — M1990 Unspecified osteoarthritis, unspecified site: Secondary | ICD-10-CM | POA: Diagnosis not present

## 2023-10-12 DIAGNOSIS — K219 Gastro-esophageal reflux disease without esophagitis: Secondary | ICD-10-CM | POA: Diagnosis not present

## 2023-10-12 DIAGNOSIS — D472 Monoclonal gammopathy: Secondary | ICD-10-CM | POA: Diagnosis not present

## 2023-10-12 DIAGNOSIS — R5383 Other fatigue: Secondary | ICD-10-CM | POA: Insufficient documentation

## 2023-10-12 DIAGNOSIS — N189 Chronic kidney disease, unspecified: Secondary | ICD-10-CM | POA: Insufficient documentation

## 2023-10-12 DIAGNOSIS — L821 Other seborrheic keratosis: Secondary | ICD-10-CM | POA: Diagnosis not present

## 2023-10-12 DIAGNOSIS — Z01812 Encounter for preprocedural laboratory examination: Secondary | ICD-10-CM | POA: Diagnosis not present

## 2023-10-12 DIAGNOSIS — Z7984 Long term (current) use of oral hypoglycemic drugs: Secondary | ICD-10-CM | POA: Diagnosis not present

## 2023-10-12 DIAGNOSIS — Z85828 Personal history of other malignant neoplasm of skin: Secondary | ICD-10-CM | POA: Diagnosis not present

## 2023-10-12 DIAGNOSIS — L905 Scar conditions and fibrosis of skin: Secondary | ICD-10-CM | POA: Diagnosis not present

## 2023-10-12 LAB — CBC WITH DIFFERENTIAL/PLATELET
Abs Immature Granulocytes: 0.01 10*3/uL (ref 0.00–0.07)
Basophils Absolute: 0 10*3/uL (ref 0.0–0.1)
Basophils Relative: 0 %
Eosinophils Absolute: 0.1 10*3/uL (ref 0.0–0.5)
Eosinophils Relative: 2 %
HCT: 37.1 % — ABNORMAL LOW (ref 39.0–52.0)
Hemoglobin: 11.5 g/dL — ABNORMAL LOW (ref 13.0–17.0)
Immature Granulocytes: 0 %
Lymphocytes Relative: 40 %
Lymphs Abs: 1.5 10*3/uL (ref 0.7–4.0)
MCH: 31.6 pg (ref 26.0–34.0)
MCHC: 31 g/dL (ref 30.0–36.0)
MCV: 101.9 fL — ABNORMAL HIGH (ref 80.0–100.0)
Monocytes Absolute: 0.5 10*3/uL (ref 0.1–1.0)
Monocytes Relative: 14 %
Neutro Abs: 1.6 10*3/uL — ABNORMAL LOW (ref 1.7–7.7)
Neutrophils Relative %: 44 %
Platelets: 191 10*3/uL (ref 150–400)
RBC: 3.64 MIL/uL — ABNORMAL LOW (ref 4.22–5.81)
RDW: 14.4 % (ref 11.5–15.5)
WBC: 3.6 10*3/uL — ABNORMAL LOW (ref 4.0–10.5)
nRBC: 0 % (ref 0.0–0.2)

## 2023-10-12 LAB — GLUCOSE, CAPILLARY: Glucose-Capillary: 103 mg/dL — ABNORMAL HIGH (ref 70–99)

## 2023-10-12 LAB — KAPPA/LAMBDA LIGHT CHAINS
Kappa free light chain: 13.8 mg/L (ref 3.3–19.4)
Kappa, lambda light chain ratio: 0.16 — ABNORMAL LOW (ref 0.26–1.65)
Lambda free light chains: 85.4 mg/L — ABNORMAL HIGH (ref 5.7–26.3)

## 2023-10-12 LAB — BETA 2 MICROGLOBULIN, SERUM: Beta-2 Microglobulin: 7.5 mg/L — ABNORMAL HIGH (ref 0.6–2.4)

## 2023-10-12 MED ORDER — NALOXONE HCL 0.4 MG/ML IJ SOLN
INTRAMUSCULAR | Status: AC
  Filled 2023-10-12: qty 1

## 2023-10-12 MED ORDER — FLUMAZENIL 0.5 MG/5ML IV SOLN
INTRAVENOUS | Status: AC
  Filled 2023-10-12: qty 5

## 2023-10-12 MED ORDER — FENTANYL CITRATE (PF) 100 MCG/2ML IJ SOLN
INTRAMUSCULAR | Status: AC | PRN
  Administered 2023-10-12: 50 ug via INTRAVENOUS

## 2023-10-12 MED ORDER — SODIUM CHLORIDE 0.9 % IV SOLN: INTRAVENOUS | Status: DC

## 2023-10-12 MED ORDER — MIDAZOLAM HCL 2 MG/2ML IJ SOLN
INTRAMUSCULAR | Status: AC
  Filled 2023-10-12: qty 4

## 2023-10-12 MED ORDER — DIPHENHYDRAMINE HCL 50 MG/ML IJ SOLN
INTRAMUSCULAR | Status: AC | PRN
  Administered 2023-10-12: 25 mg via INTRAVENOUS

## 2023-10-12 MED ORDER — SODIUM CHLORIDE 0.9% FLUSH
3.0000 mL | Freq: Two times a day (BID) | INTRAVENOUS | Status: DC
  Administered 2023-10-12: 3 mL via INTRAVENOUS

## 2023-10-12 MED ORDER — DIPHENHYDRAMINE HCL 50 MG/ML IJ SOLN
INTRAMUSCULAR | Status: AC
  Filled 2023-10-12: qty 1

## 2023-10-12 MED ORDER — MIDAZOLAM HCL 2 MG/2ML IJ SOLN
INTRAMUSCULAR | Status: AC | PRN
  Administered 2023-10-12: 1 mg via INTRAVENOUS

## 2023-10-12 MED ORDER — LIDOCAINE HCL 1 % IJ SOLN
INTRAMUSCULAR | Status: AC | PRN
  Administered 2023-10-12: 10 mL via INTRADERMAL

## 2023-10-12 MED ORDER — FENTANYL CITRATE (PF) 100 MCG/2ML IJ SOLN
INTRAMUSCULAR | Status: AC
  Filled 2023-10-12: qty 2

## 2023-10-12 NOTE — Procedures (Signed)
Interventional Radiology Procedure Note  Procedure: CT guided bone marrow aspiration and biopsy  Complications: None  EBL: < 10 mL  Findings: Aspirate and core biopsy performed of bone marrow in right iliac bone.  Plan: Bedrest supine x 1 hrs  Lianni Kanaan T. San Lohmeyer, M.D Pager:  [phone]   

## 2023-10-12 NOTE — Discharge Instructions (Signed)
For questions /concerns may call Interventional Radiology at [phone] or  Interventional Radiology clinic [phone]   You may remove your dressing and shower tomorrow afternoon                                                   Bone Marrow Aspiration and Bone Marrow Biopsy, Adult, Care After The following information offers guidance on how to care for yourself after your procedure. Your health care provider may also give you more specific instructions. If you have problems or questions, contact your health care provider. What can I expect after the procedure? After the procedure, it is common to have: Mild pain and tenderness. Swelling. Bruising. Follow these instructions at home: Incision care  Follow instructions from your health care provider about how to take care of the incision site. Make sure you: Wash your hands with soap and water for at least 20 seconds before and after you change your bandage (dressing). If soap and water are not available, use hand sanitizer. Change your dressing as told by your health care provider. Leave stitches (sutures), skin glue, or adhesive strips in place. These skin closures may need to stay in place for 2 weeks or longer. If adhesive strip edges start to loosen and curl up, you may trim the loose edges. Do not remove adhesive strips completely unless your health care provider tells you to do that. Check your incision site every day for signs of infection. Check for: More redness, swelling, or pain. Fluid or blood. Warmth. Pus or a bad smell. Activity Return to your normal activities as told by your health care provider. Ask your health care provider what activities are safe for you. Do not lift anything that is heavier than 10 lb (4.5 kg), or the limit that you are told, until your health care provider says that it is safe. If you were given a sedative during the procedure, it can affect you for several hours. Do not drive or operate machinery  until your health care provider says that it is safe. General instructions  Take over-the-counter and prescription medicines only as told by your health care provider. Do not take baths, swim, or use a hot tub until your health care provider approves. Ask your health care provider if you may take showers. You may only be allowed to take sponge baths. If directed, put ice on the affected area. To do this: Put ice in a plastic bag. Place a towel between your skin and the bag. Leave the ice on for 20 minutes, 2-3 times a day. If your skin turns bright red, remove the ice right away to prevent skin damage. The risk of skin damage is higher if you cannot feel pain, heat, or cold. Contact a health care provider if: You have signs of infection. Your pain is not controlled with medicine. You have cancer, and a temperature of 100.52F (38C) or higher. Get help right away if: You have a temperature of 101F (38.3C) or higher, or as told by your health care provider. You have bleeding from the incision site that cannot be controlled. This information is not intended to replace advice given to you by your health care provider. Make sure you discuss any questions you have with your health care provider. Document Revised: 03/23/2022 Document Reviewed: 03/23/2022 Elsevier Patient Education  2024 [employer].  Moderate Conscious Sedation, Adult, Care After After the procedure, it is common to have: Sleepiness for a few hours. Impaired judgment for a few hours. Trouble with balance. Nausea or vomiting if you eat too soon. Follow these instructions at home: For the time period you were told by your health care provider:  Rest. Do not participate in activities where you could fall or become injured. Do not drive or use machinery. Do not drink alcohol. Do not take sleeping pills or medicines that cause drowsiness. Do not  make important decisions or sign legal documents. Do not take care of children on your own. Eating and drinking Follow instructions from your health care provider about what you may eat and drink. Drink enough fluid to keep your urine pale yellow. If you vomit: Drink clear fluids slowly and in small amounts as you are able. Clear fluids include water, ice chips, low-calorie sports drinks, and fruit juice that has water added to it (diluted fruit juice). Eat light and bland foods in small amounts as you are able. These foods include bananas, applesauce, rice, lean meats, toast, and crackers. General instructions Take over-the-counter and prescription medicines only as told by your health care provider. Have a responsible adult stay with you for the time you are told. Do not use any products that contain nicotine or tobacco. These products include cigarettes, chewing tobacco, and vaping devices, such as e-cigarettes. If you need help quitting, ask your health care provider. Return to your normal activities as told by your health care provider. Ask your health care provider what activities are safe for you. Your health care provider may give you more instructions. Make sure you know what you can and cannot do. Contact a health care provider if: You are still sleepy or having trouble with balance after 24 hours. You feel light-headed. You vomit every time you eat or drink. You get a rash. You have a fever. You have redness or swelling around the IV site. Get help right away if: You have trouble breathing. You start to feel confused at home. These symptoms may be an emergency. Get help right away. Call 911. Do not wait to see if the symptoms will go away. Do not drive yourself to the hospital. This information is not intended to replace advice given to you by your health care provider. Make sure you discuss any questions you have with your health care provider. Document Revised: 06/01/2022  Document Reviewed: 06/01/2022 Elsevier Patient Education  2024 [employer].

## 2023-10-14 LAB — MULTIPLE MYELOMA PANEL, SERUM
Albumin SerPl Elph-Mcnc: 3.6 g/dL (ref 2.9–4.4)
Albumin/Glob SerPl: 0.7 (ref 0.7–1.7)
Alpha 1: 0.3 g/dL (ref 0.0–0.4)
Alpha2 Glob SerPl Elph-Mcnc: 1 g/dL (ref 0.4–1.0)
B-Globulin SerPl Elph-Mcnc: 0.9 g/dL (ref 0.7–1.3)
Gamma Glob SerPl Elph-Mcnc: 3.2 g/dL — ABNORMAL HIGH (ref 0.4–1.8)
Globulin, Total: 5.3 g/dL — ABNORMAL HIGH (ref 2.2–3.9)
IgA: 37 mg/dL — ABNORMAL LOW (ref 61–437)
IgG (Immunoglobin G), Serum: 4185 mg/dL — ABNORMAL HIGH (ref 603–1613)
IgM (Immunoglobulin M), Srm: 17 mg/dL (ref 15–143)
M Protein SerPl Elph-Mcnc: 2.9 g/dL — ABNORMAL HIGH
Total Protein ELP: 8.9 g/dL — ABNORMAL HIGH (ref 6.0–8.5)

## 2023-10-14 LAB — SURGICAL PATHOLOGY

## 2023-10-22 ENCOUNTER — Encounter (HOSPITAL_COMMUNITY): Payer: Self-pay

## 2023-11-02 ENCOUNTER — Encounter: Payer: Self-pay | Admitting: Family Medicine

## 2023-11-02 ENCOUNTER — Ambulatory Visit (INDEPENDENT_AMBULATORY_CARE_PROVIDER_SITE_OTHER): Payer: PPO | Admitting: Family Medicine

## 2023-11-02 ENCOUNTER — Inpatient Hospital Stay: Payer: PPO | Attending: Hematology | Admitting: Hematology

## 2023-11-02 VITALS — BP 148/67 | HR 65 | Temp 97.5°F | Resp 16

## 2023-11-02 VITALS — BP 110/60 | HR 66 | Temp 97.8°F | Ht 71.0 in | Wt 189.0 lb

## 2023-11-02 DIAGNOSIS — Z87891 Personal history of nicotine dependence: Secondary | ICD-10-CM | POA: Insufficient documentation

## 2023-11-02 DIAGNOSIS — C9000 Multiple myeloma not having achieved remission: Secondary | ICD-10-CM | POA: Diagnosis not present

## 2023-11-02 DIAGNOSIS — N1832 Chronic kidney disease, stage 3b: Secondary | ICD-10-CM

## 2023-11-02 DIAGNOSIS — Z79899 Other long term (current) drug therapy: Secondary | ICD-10-CM | POA: Insufficient documentation

## 2023-11-02 DIAGNOSIS — N183 Chronic kidney disease, stage 3 unspecified: Secondary | ICD-10-CM | POA: Diagnosis not present

## 2023-11-02 DIAGNOSIS — Z7984 Long term (current) use of oral hypoglycemic drugs: Secondary | ICD-10-CM | POA: Diagnosis not present

## 2023-11-02 DIAGNOSIS — E1122 Type 2 diabetes mellitus with diabetic chronic kidney disease: Secondary | ICD-10-CM | POA: Diagnosis not present

## 2023-11-02 DIAGNOSIS — E538 Deficiency of other specified B group vitamins: Secondary | ICD-10-CM

## 2023-11-02 DIAGNOSIS — D472 Monoclonal gammopathy: Secondary | ICD-10-CM | POA: Diagnosis not present

## 2023-11-02 DIAGNOSIS — I129 Hypertensive chronic kidney disease with stage 1 through stage 4 chronic kidney disease, or unspecified chronic kidney disease: Secondary | ICD-10-CM | POA: Diagnosis not present

## 2023-11-02 DIAGNOSIS — E039 Hypothyroidism, unspecified: Secondary | ICD-10-CM

## 2023-11-02 DIAGNOSIS — Z0000 Encounter for general adult medical examination without abnormal findings: Secondary | ICD-10-CM | POA: Diagnosis not present

## 2023-11-02 LAB — BASIC METABOLIC PANEL
BUN: 37 mg/dL — ABNORMAL HIGH (ref 6–23)
CO2: 25 meq/L (ref 19–32)
Calcium: 8.9 mg/dL (ref 8.4–10.5)
Chloride: 105 meq/L (ref 96–112)
Creatinine, Ser: 1.74 mg/dL — ABNORMAL HIGH (ref 0.40–1.50)
GFR: 36.41 mL/min — ABNORMAL LOW (ref >60.00)
Glucose, Bld: 76 mg/dL (ref 70–99)
Potassium: 4.7 meq/L (ref 3.5–5.1)
Sodium: 132 meq/L — ABNORMAL LOW (ref 135–145)

## 2023-11-02 LAB — LIPID PANEL
Cholesterol: 103 mg/dL (ref 0–200)
HDL: 16.8 mg/dL — ABNORMAL LOW (ref >39.00)
LDL Cholesterol: 46 mg/dL (ref 0–99)
NonHDL: 86.38
Total CHOL/HDL Ratio: 6
Triglycerides: 204 mg/dL — ABNORMAL HIGH (ref 0.0–149.0)
VLDL: 40.8 mg/dL — ABNORMAL HIGH (ref 0.0–40.0)

## 2023-11-02 LAB — HEPATIC FUNCTION PANEL
ALT: 10 U/L (ref 0–53)
AST: 8 U/L (ref 0–37)
Albumin: 3.7 g/dL (ref 3.5–5.2)
Alkaline Phosphatase: 70 U/L (ref 39–117)
Bilirubin, Direct: 0.9 mg/dL — ABNORMAL HIGH (ref 0.0–0.3)
Total Bilirubin: 0.7 mg/dL (ref 0.2–1.2)
Total Protein: 9.1 g/dL — ABNORMAL HIGH (ref 6.0–8.3)

## 2023-11-02 LAB — CBC WITH DIFFERENTIAL/PLATELET
Basophils Absolute: 0 10*3/uL (ref 0.0–0.1)
Basophils Relative: 0.3 % (ref 0.0–3.0)
Eosinophils Absolute: 0.1 10*3/uL (ref 0.0–0.7)
Eosinophils Relative: 2.2 % (ref 0.0–5.0)
HCT: 35.5 % — ABNORMAL LOW (ref 39.0–52.0)
Hemoglobin: 11.9 g/dL — ABNORMAL LOW (ref 13.0–17.0)
Lymphocytes Relative: 44.3 % (ref 12.0–46.0)
Lymphs Abs: 1.8 10*3/uL (ref 0.7–4.0)
MCHC: 33.6 g/dL (ref 30.0–36.0)
MCV: 95.2 fL (ref 78.0–100.0)
Monocytes Absolute: 0.5 10*3/uL (ref 0.1–1.0)
Monocytes Relative: 11.8 % (ref 3.0–12.0)
Neutro Abs: 1.7 10*3/uL (ref 1.4–7.7)
Neutrophils Relative %: 41.4 % — ABNORMAL LOW (ref 43.0–77.0)
Platelets: 244 10*3/uL (ref 150.0–400.0)
RBC: 3.73 Mil/uL — ABNORMAL LOW (ref 4.22–5.81)
RDW: 14.3 % (ref 11.5–15.5)
WBC: 4.1 10*3/uL (ref 4.0–10.5)

## 2023-11-02 LAB — TSH: TSH: 0.35 u[IU]/mL (ref 0.35–5.50)

## 2023-11-02 LAB — HEMOGLOBIN A1C: Hgb A1c MFr Bld: 6.5 % (ref 4.6–6.5)

## 2023-11-02 LAB — T3, FREE: T3, Free: 2.9 pg/mL (ref 2.3–4.2)

## 2023-11-02 LAB — VITAMIN B12: Vitamin B-12: 307 pg/mL (ref 211–911)

## 2023-11-02 LAB — PSA: PSA: 0 ng/mL — ABNORMAL LOW (ref 0.10–4.00)

## 2023-11-02 LAB — T4, FREE: Free T4: 0.94 ng/dL (ref 0.60–1.60)

## 2023-11-02 MED ORDER — ESOMEPRAZOLE MAGNESIUM 40 MG PO CPDR: 40.0000 mg | DELAYED_RELEASE_CAPSULE | Freq: Every day | ORAL | 3 refills | Status: DC

## 2023-11-02 MED ORDER — SYNTHROID 137 MCG PO TABS: 137.0000 ug | ORAL_TABLET | Freq: Every day | ORAL | 3 refills | Status: DC

## 2023-11-02 MED ORDER — TRAZODONE HCL 100 MG PO TABS: 100.0000 mg | ORAL_TABLET | Freq: Every day | ORAL | 3 refills | Status: DC

## 2023-11-02 MED ORDER — ATORVASTATIN CALCIUM 10 MG PO TABS: 10.0000 mg | ORAL_TABLET | Freq: Every day | ORAL | 3 refills | Status: DC

## 2023-11-02 MED ORDER — SUMATRIPTAN SUCCINATE 100 MG PO TABS: 100.0000 mg | ORAL_TABLET | ORAL | 5 refills | Status: DC | PRN

## 2023-11-02 MED ORDER — GLIPIZIDE 5 MG PO TABS: 5.0000 mg | ORAL_TABLET | Freq: Two times a day (BID) | ORAL | 3 refills | Status: DC

## 2023-11-02 MED ORDER — CANAGLIFLOZIN 300 MG PO TABS: 300.0000 mg | ORAL_TABLET | Freq: Every day | ORAL | 3 refills | Status: DC

## 2023-11-02 MED ORDER — OLMESARTAN MEDOXOMIL 40 MG PO TABS: 40.0000 mg | ORAL_TABLET | Freq: Every day | ORAL | 3 refills | Status: DC

## 2023-11-02 NOTE — Progress Notes (Signed)
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 male who is here for continued evaluation and management of multiple myeloma.   I last connected with the patient via phone on 08/03/2023 and he complained of fatigue, but was doing well overall.  Patient is accompanied by his daughter during this visit. Patient notes he has been doing well overall since our last visit. He notes that his bone marrow biopsy went well without any new or severe toxicities. He complains of right hip pain after his bone marrow biopsy.   He complains of fatigue/lethargy and headaches. Patient notes that fatigue has worsened in last six months. He notes he has chronic headaches, but had couple episodes of migraines since last visit. He uses tylenol to help his migraines.  Patient has been following-up with his PCP regarding chronic headaches and fatigue. Patient notes he has lost around 50 lbs in the last 2 years. Patient had his PCP appointment this morning and [name] has adjusted some his blood pressure medication as he has lost weight.   Patient notes he is more fatigued and tired after 3-4 pm. He denies taking naps.   He has been playing golf, but notes that he feels fatigued earlier.   He denies any new infection issues, fever, chills, night sweats, unexpected weight loss, SOB, abnormal bowel movement, abdominal pain, chest pain, back pain, or leg swelling. Patient complains of intermittent bilateral knee pain, which improves with tylenol.   MEDICAL HISTORY:  Past Medical History:  Diagnosis Date   Arthritis     back   Cancer [facility])    prostate, sees Dr. Heloise Purpura    Chronic kidney disease 2017   stage 3 kidney disease   Diabetes mellitus    Erectile dysfunction    GERD (gastroesophageal reflux disease)    Hyperlipidemia    Hypertension    Migraines    Peptic ulcer    Thyroid disease     SURGICAL HISTORY: Past Surgical History:  Procedure Laterality Date   APPENDECTOMY  1950   COLONOSCOPY  05/22/2021   per Dr. Russella Dar, benign polyps, no repeats needed   HERNIA REPAIR     umbilical    MENISCUS REPAIR Left    PROSTATE SURGERY  2009   radical prostatectomy   SPINE SURGERY     L4-L5 fusion per Dr. Trey Sailors    TONSILLECTOMY AND ADENOIDECTOMY  1954    SOCIAL HISTORY: Social History   Socioeconomic History   Marital status: Married    Spouse name: Not on file   Number of children: Not on file   Years of education: Not on file   Highest education level: Some college, no degree  Occupational History   Not on file  Tobacco Use   Smoking status: Former    Current packs/day: 0.00    Average packs/day: 0.3 packs/day for 5.0 years (1.3 ttl pk-yrs)    Types: Cigarettes    Start date: 16    Quit date: 34    Years since quitting: 57.9   Smokeless tobacco: Never  Vaping Use   Vaping status: Never Used  Substance and Sexual Activity   Alcohol use: Not Currently    Comment: rare one glass every 6 months   Drug use: No   Sexual activity: Yes  Other Topics Concern   Not on file  Social History Narrative   Not on file   Social Determinants of Health   Financial Resource Strain: Low Risk  (07/14/2022)   Overall Financial Resource Strain (CARDIA)    Difficulty of Paying Living Expenses: Not hard at all  Food Insecurity: No Food Insecurity (03/15/2023)   Hunger Vital Sign    Worried About Running Out of Food in the Last Year: Never true    Ran Out of Food in the Last Year: Never true  Transportation Needs: No Transportation Needs (03/15/2023)   PRAPARE - Therapist, art (Medical): No    Lack of Transportation (Non-Medical): No  Physical Activity: Insufficiently Active (03/15/2023)   Exercise Vital Sign    Days of Exercise per Week: 3 days    Minutes of Exercise per Session: 20 min  Stress: Stress Concern Present (03/15/2023)   [employer] of Occupational Health - Occupational Stress Questionnaire    Feeling of Stress : To some extent  Social Connections: Moderately Isolated (03/15/2023)   Social Connection and Isolation Panel [NHANES]    Frequency of Communication with Friends and Family: Three times a week    Frequency of Social Gatherings with Friends and Family: Once a week    Attends Religious Services: Never    Database administrator or Organizations: No    Attends Engineer, structural: Not on file    Marital Status: Married  Catering manager Violence: Not At Risk (01/15/2022)   Humiliation, Afraid, Rape, and Kick questionnaire    Fear of Current or Ex-Partner: No    Emotionally Abused: No    Physically Abused: No    Sexually Abused: No    FAMILY HISTORY: Family History  Problem Relation Age of Onset   Hypertension Other    Cancer Other    Colon cancer Neg Hx    Colon polyps Neg Hx    Esophageal cancer Neg Hx    Rectal cancer Neg Hx    Stomach cancer Neg Hx     ALLERGIES:  has No Known Allergies.  MEDICATIONS:  Current Outpatient Medications  Medication Sig Dispense Refill   aspirin-acetaminophen-caffeine (EXCEDRIN MIGRAINE) 250-250-65 MG tablet Take 1 tablet by mouth every 8 (eight) hours as needed for migraine.     atorvastatin (LIPITOR) 10 MG tablet Take 1 tablet (10 mg total) by mouth daily. 90 tablet 3   Blood Glucose Monitoring Suppl (ONE TOUCH ULTRA 2) w/Device KIT Use to check blood sugars once daily. 1 kit 0   canagliflozin (INVOKANA) 300 MG TABS tablet Take 1 tablet (300 mg total) by mouth daily before breakfast. 90 tablet 3   CHELATED ZINC PO Take 1 capsule by mouth daily.      Cholecalciferol (VITAMIN D3) 2000 units capsule Take 2,000 Units by mouth daily.      Cyanocobalamin (VITAMIN B 12 PO) Take 2,500 mcg by mouth daily.      esomeprazole (NEXIUM) 40 MG capsule Take 1 capsule (40 mg total) by mouth daily. 90 capsule 3   FOLIC ACID PO Take 800 mcg by mouth daily.      glipiZIDE (GLUCOTROL) 5 MG tablet Take 1 tablet (5 mg total) by mouth 2 (two) times daily before a meal. 180 tablet 3   glucose  blood (ONETOUCH VERIO) test strip USE ONCE A DAY TO TEST BLOOD SUGAR 100 strip 1   GNP GARLIC EXTRACT PO Take 800 mg by mouth daily.     Levomefolate Glucosamine (METHYLFOLATE PO) Take 1 tablet by mouth daily.     olmesartan (BENICAR) 40 MG tablet Take 1 tablet (40 mg total) by mouth daily. 90 tablet 3   Omega-3 Fatty Acids (FISH OIL) 1000 MG CAPS Take by mouth daily.      OneTouch Delica Lancets 30G MISC USE ONCE A DAY WHEN CHECKING BLOOD SUGAR 100 each 3   pyridOXINE (VITAMIN B-6) 100 MG tablet Take 100 mg by mouth daily.     SUMAtriptan (IMITREX) 100 MG tablet Take 1 tablet (100 mg total) by mouth as needed for migraine. May repeat in 2 hours if headache persists or recurs. 10 tablet 5   SYNTHROID 137 MCG tablet Take 1 tablet (137 mcg total) by mouth daily before breakfast. 90 tablet 3   traZODone (DESYREL) 100 MG tablet Take 1 tablet (100 mg total) by mouth at bedtime. 90 tablet 3   No current facility-administered medications for this visit.    REVIEW OF SYSTEMS:    10 Point review of Systems was done is negative except as noted above.   PHYSICAL EXAMINATION: .BP (!) 148/67 (BP Location: Left Arm, Patient Position: Sitting)   Pulse 65   Temp (!) 97.5 F (36.4 C) (Temporal)   Resp 16   SpO2 100%  . GENERAL:alert, in no acute distress and comfortable SKIN: no acute rashes, no significant lesions EYES: conjunctiva are pink and non-injected, sclera anicteric OROPHARYNX: MMM, no exudates, no oropharyngeal erythema or ulceration NECK: supple, no JVD LYMPH:  no  palpable lymphadenopathy in the cervical, axillary or inguinal regions LUNGS: clear to auscultation b/l with normal respiratory effort HEART: regular rate & rhythm ABDOMEN:  normoactive bowel sounds , non tender, not distended. Extremity: no pedal edema PSYCH: alert & oriented x 3 with fluent speech NEURO: no focal motor/sensory deficits   LABORATORY DATA:  I have reviewed the data as listed  .    Latest Ref Rng & Units 11/02/2023    8:58 AM 10/12/2023    7:30 AM 10/11/2023    7:55 AM  CBC  WBC 4.0 - 10.5 K/uL 4.1  3.6  4.0   Hemoglobin 13.0 - 17.0 g/dL 16.1  09.6  04.5   Hematocrit 39.0 - 52.0 % 35.5  37.1  35.2   Platelets 150.0 - 400.0 K/uL 244.0  191  227    . CBC    Component Value Date/Time   WBC 4.1 11/02/2023 0858   RBC 3.73 (L) 11/02/2023 0858   HGB 11.9 (L) 11/02/2023 0858   HGB 11.7 (L) 10/11/2023 0755   HCT 35.5 (L) 11/02/2023 0858   PLT 244.0 11/02/2023 0858   PLT 227 10/11/2023 0755   MCV 95.2 11/02/2023 0858   MCH 31.6 10/12/2023 0730   MCHC 33.6 11/02/2023 0858   RDW 14.3 11/02/2023 0858   LYMPHSABS 1.8 11/02/2023 0858   MONOABS 0.5 11/02/2023 0858   EOSABS 0.1 11/02/2023 0858   BASOSABS 0.0 11/02/2023 0858    .    Latest Ref Rng & Units 11/02/2023    8:58 AM 10/11/2023    7:55 AM 07/22/2023    7:28 AM  CMP  Glucose 70 - 99 mg/dL 76  409  80   BUN 6 - 23 mg/dL 37  30  36   Creatinine 0.40 - 1.50 mg/dL  1.74  1.75  1.46   Sodium 135 - 145 mEq/L 132  134  136   Potassium 3.5 - 5.1 mEq/L 4.7  4.5  4.5   Chloride 96 - 112 mEq/L 105  103  107   CO2 19 - 32 mEq/L 25  26  22    Calcium 8.4 - 10.5 mg/dL 8.9  9.1  9.2   Total Protein 6.0 - 8.3 g/dL 9.1  9.5  9.7   Total Bilirubin 0.2 - 1.2 mg/dL 0.7  0.4  0.4   Alkaline Phos 39 - 117 U/L 70  64  71   AST 0 - 37 U/L 8  11  11    ALT 0 - 53 U/L 10  10  12      03/27/2021 BM Bx   DIAGNOSIS:   BONE MARROW, ASPIRATE, CLOT, CORE:  -  Hypercellular bone marrow with involvement by plasma cell neoplasm   -  See comment    COMMENT:   Plasma cells are increased on aspirate smears (8% by manual differential  count) and by CD138 immunohistochemistry on the core biopsy and clot  section (15%).  Plasma cells express lambda-restricted light chains by  in situ hybridization.  Together the findings are consistent with bone  marrow involvement by a lambda-restricted plasma cell neoplasm.  Correlation with clinical impressions, radiographic studies, other  laboratory data, and cytogenetic/FISH results is recommended.   03/27/2021 Molecular Pathology    03/27/2021 Cytogenetics    RADIOGRAPHIC STUDIES: I have personally reviewed the radiological images as listed and agreed with the findings in the report. CT BONE MARROW BIOPSY & ASPIRATION  Result Date: 10/12/2023 CLINICAL DATA:  Small during myeloma and need for bone marrow biopsy due to increased M protein level. EXAM: CT GUIDED BONE MARROW ASPIRATION AND BIOPSY ANESTHESIA/SEDATION: Moderate (conscious) sedation was employed during this procedure. A total of Versed 3.0 mg and Fentanyl 100 mcg was administered intravenously by radiology nursing. Moderate Sedation Time: 15 minutes. The patient's level of consciousness and vital signs were monitored continuously by radiology nursing throughout the procedure under my direct supervision. PROCEDURE: The procedure risks, benefits, and alternatives were explained to the patient. Questions regarding the procedure were encouraged and answered. The patient understands and consents to the procedure. A time out was performed prior to initiating the procedure. The right gluteal region was prepped with chlorhexidine. Sterile gown and sterile gloves were used for the procedure. Local anesthesia was provided with 1% Lidocaine. Under CT guidance, an 11 gauge On Control bone cutting needle was advanced from a posterior approach into the right iliac bone. Needle positioning was confirmed with CT. Initial non heparinized  and heparinized aspirate samples were obtained of bone marrow. Core biopsy was performed via the On Control drill needle. COMPLICATIONS: None FINDINGS: Inspection of initial aspirate did reveal visible particles. Intact core biopsy sample was obtained. IMPRESSION: CT guided bone marrow biopsy of right posterior iliac bone with both aspirate and core samples obtained. Electronically Signed   By: Irish Lack M.D.   On: 10/12/2023 10:59     ASSESSMENT & PLAN:   Troy Howard is a 82 y.o. caucasian male with    1. IgG Lambda smoldering myeloma He has a M-spike of 0.8g/dl and normal SFLC and ratio. No anemia - nl Hgb No focal bone pains --- Skeletal survey - shows no  No hypercalcemia. 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  05/10/18 Metastatic Bone Survey revealed no lytic lesions on skeletal survey to suggest multiple myeloma.   2. 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 Signe Colt  -mild non nephrotic proteinuria on 24h UPEP-Continue follow up with Dr Signe Colt for CKD    3. Vitamin B12 deficient  Antibody testing done - no evidence of pernicious anemia. No evidence of pernicious anemia based on neg IF and parietal cell ab -continue B12 replacement per PCP  4.  Past Medical History:  Diagnosis Date   Arthritis    back   Cancer [facility])    prostate, sees Dr. Heloise Purpura    Chronic kidney disease 2017   stage 3 kidney disease   Diabetes mellitus    Erectile dysfunction    GERD (gastroesophageal reflux disease)    Hyperlipidemia    Hypertension    Migraines    Peptic ulcer    Thyroid disease    PLAN:  -Discussed lab results from 10/11/2023 and 10/12/2023, in detail with the patient. CBC shows slightly low WBC of 3.6 K, slightly low hemoglobin of 11.5 g/dL with hematocrit of 16.1%. CMP shows elevated BUN of 30, elevated creatinine of 1.75, elevated total protein of 9.5,  and slightly low AST of 11.  -Discussed multiple myeloma panel results from 10/11/2023. Showed elevated M-protein of 2.9.  -Discussed bone marrow biopsy results from 10/12/2023 with the patient. Showed Inspection of initial aspirate did reveal visible particles. Intact core biopsy sample was obtained. Increased number of plasma cells representing 23% of all cells in the aspirate associated with interstitial infiltrates and numerous variably sized clusters in the clot and biopsy sections.  -Continue to follow-up with PCP regarding headaches.  -Discussed with the patient that according to labs, bone marrow biopsy, and physical symptoms, patient does not meet the criteria for treatment. If bone marrow biopsy resulted in more than 60% plasma cell, then we would go with treatment. -Discussed with the patient that fatigue could be from blood pressure medication, age, myeloma, and nutrition.  -Discussed with the patient that we would change nutrition and optimize all his medications to see if it helps his fatigue. Follow-up with PCP with optimize medications.  -Patient could try taking power-naps to see if it helps his fatigue as well.  -Answered all of patient's questions regarding lab results, bone marrow biopsy results, smoldering myeloma and it's treatment.  -Educated the patient and his daughter about smoldering myeloma and targeted-therapy treatment for myeloma.  -Continue vitamin D 2000 units daily    FOLLOW UP: Phone visit in 3 months with Dr Candise Che Labs 2 weeks prior to phone visit  The total time spent in the appointment was 30 minutes* .  All of the patient's questions were answered with apparent satisfaction. The patient knows to call the clinic with any problems, questions or concerns.   Wyvonnia Lora MD MS AAHIVMS [facility] Hematology/Oncology Physician [facility]  .*Total Encounter Time as defined by the Centers for Medicare and Medicaid Services includes, in addition to  the face-to-face time of a patient visit (documented in the note above) non-face-to-face time: obtaining and reviewing outside history, ordering and reviewing medications, tests or procedures, care coordination (communications with other health care professionals or caregivers) and documentation in the medical record.   I,Param Shah,acting as a Neurosurgeon for Wyvonnia Lora, MD.,have documented all relevant documentation on the behalf of Wyvonnia Lora, MD,as directed by  Wyvonnia Lora, MD while in the presence of  Wyvonnia Lora, MD.  .I have reviewed the above documentation for accuracy and completeness, and I agree with the above. [name]

## 2023-11-02 NOTE — Progress Notes (Signed)
Subjective:    Patient ID: Troy Howard, male    DOB: Jan 08, 1941, 82 y.o.   MRN: [id number]  HPI Here for a well exam. He feels well in general, but he has some issues to discuss. He sees Dr. Candise Howard for "smoldering" multiple myeloma, and he had another bone marrow biopsy on 10-12-23. He will meet with Dr. Candise Howard later today to go over these results. He sees Dr. Signe Howard for CKD once a year. His BP has been stable. His DM has been under better control since he has lost weight. He has lost another 6 lbs in the past year by watching his diet. The highest glucose readings he ever gets now is 150 and this sometimes drops into the 50's. He gets shaky and has to eat and drink something when this happens. He had not had a true migraine for years, but he had one a few weeks ago. This included light sensitivity, throbbing pain, and vomiting. Usually Ecedrin Migraine helps these, but not that day. He also complains about sleep. He has taken Temazepam 30 mg at bedtime for years, but it no longer helps very much. He has been adding 40 mg of melatonin to it every night with mixed results. He had tried Ambien in the past with poor results.    Review of Systems  Constitutional: Negative.   HENT: Negative.    Eyes: Negative.   Respiratory: Negative.    Cardiovascular: Negative.   Gastrointestinal: Negative.   Genitourinary: Negative.   Musculoskeletal: Negative.   Skin: Negative.   Neurological:  Positive for headaches.  Psychiatric/Behavioral:  Positive for sleep disturbance. Negative for dysphoric mood. The patient is not nervous/anxious.        Objective:   Physical Exam Constitutional:      General: He is not in acute distress.    Appearance: Normal appearance. He is well-developed. He is not diaphoretic.  HENT:     Head: Normocephalic and atraumatic.     Right Ear: External ear normal.     Left Ear: External ear normal.     Nose: Nose normal.     Mouth/Throat:     Pharynx: No oropharyngeal  exudate.  Eyes:     General: No scleral icterus.       Right eye: No discharge.        Left eye: No discharge.     Conjunctiva/sclera: Conjunctivae normal.     Pupils: Pupils are equal, round, and reactive to light.  Neck:     Thyroid: No thyromegaly.     Vascular: No JVD.     Trachea: No tracheal deviation.  Cardiovascular:     Rate and Rhythm: Normal rate and regular rhythm.     Pulses: Normal pulses.     Heart sounds: Normal heart sounds. No murmur heard.    No friction rub. No gallop.  Pulmonary:     Effort: Pulmonary effort is normal. No respiratory distress.     Breath sounds: Normal breath sounds. No wheezing or rales.  Chest:     Chest wall: No tenderness.  Abdominal:     General: Bowel sounds are normal. There is no distension.     Palpations: Abdomen is soft. There is no mass.     Tenderness: There is no abdominal tenderness. There is no guarding or rebound.  Genitourinary:    Penis: Normal. No tenderness.      Testes: Normal.  Musculoskeletal:        General: No tenderness. Normal  range of motion.     Cervical back: Neck supple.  Lymphadenopathy:     Cervical: No cervical adenopathy.  Skin:    General: Skin is warm and dry.     Coloration: Skin is not pale.     Findings: No erythema or rash.  Neurological:     General: No focal deficit present.     Mental Status: He is alert and oriented to person, place, and time.     Cranial Nerves: No cranial nerve deficit.     Motor: No abnormal muscle tone.     Coordination: Coordination normal.     Deep Tendon Reflexes: Reflexes are normal and symmetric. Reflexes normal.  Psychiatric:        Mood and Affect: Mood normal.        Behavior: Behavior normal.        Thought Content: Thought content normal.        Judgment: Judgment normal.           Assessment & Plan:  Well exam. We discussed diet and exercise. Get fasting labs. Check a thyroid panel. His diabetes is well controlled, but he is now on too much  medication after losing weight. We will decrease the Glipizide to 5 mg BID and check an A1c. In case he has more migraines, we will supply him with Sumatriptan to use as needed. He will see Dr. Candise Howard later today to follow up on the MM. For sleep he will stop Temazepam and try Trazadone 100 mg at bedtime.  [name], MD

## 2023-12-02 ENCOUNTER — Telehealth: Payer: Self-pay | Admitting: Hematology

## 2023-12-02 NOTE — Telephone Encounter (Signed)
 Spoke with patient confirming upcoming appointment

## 2023-12-22 ENCOUNTER — Other Ambulatory Visit: Payer: PPO

## 2023-12-28 DIAGNOSIS — N2581 Secondary hyperparathyroidism of renal origin: Secondary | ICD-10-CM | POA: Diagnosis not present

## 2023-12-28 DIAGNOSIS — E1122 Type 2 diabetes mellitus with diabetic chronic kidney disease: Secondary | ICD-10-CM | POA: Diagnosis not present

## 2023-12-28 DIAGNOSIS — D472 Monoclonal gammopathy: Secondary | ICD-10-CM | POA: Diagnosis not present

## 2023-12-28 DIAGNOSIS — I129 Hypertensive chronic kidney disease with stage 1 through stage 4 chronic kidney disease, or unspecified chronic kidney disease: Secondary | ICD-10-CM | POA: Diagnosis not present

## 2023-12-28 DIAGNOSIS — N1831 Chronic kidney disease, stage 3a: Secondary | ICD-10-CM | POA: Diagnosis not present

## 2023-12-28 DIAGNOSIS — Z8546 Personal history of malignant neoplasm of prostate: Secondary | ICD-10-CM | POA: Diagnosis not present

## 2023-12-29 LAB — LAB REPORT - SCANNED
Albumin, Urine POC: 24.1
EGFR: 33
Microalb Creat Ratio: 29

## 2024-01-05 ENCOUNTER — Telehealth: Payer: PPO | Admitting: Hematology

## 2024-01-25 ENCOUNTER — Ambulatory Visit (INDEPENDENT_AMBULATORY_CARE_PROVIDER_SITE_OTHER): Payer: PPO | Admitting: Family Medicine

## 2024-01-25 VITALS — BP 125/64 | Temp 97.4°F

## 2024-01-25 DIAGNOSIS — Z0000 Encounter for general adult medical examination without abnormal findings: Secondary | ICD-10-CM | POA: Diagnosis not present

## 2024-01-25 NOTE — Progress Notes (Signed)
 PATIENT CHECK-IN and HEALTH RISK ASSESSMENT QUESTIONNAIRE:  -completed by phone/video for upcoming Medicare Preventive Visit  Pre-Visit Check-in: 1)Vitals (height, wt, BP, etc) - record in vitals section for visit on day of visit Request home vitals (wt, BP, etc.) and enter into vitals, THEN update Vital Signs SmartPhrase below at the top of the HPI. See below.  2)Review and Update Medications, Allergies PMH, Surgeries, Social history in Epic 3)Hospitalizations in the last year with date/reason?  no  4)Review and Update Care Team (patient's specialists) in Epic 5) Complete PHQ9 in Epic  6) Complete Fall Screening in Epic 7)Review all Health Maintenance Due and order under PCP if not done.  Medicare Wellness Patient Questionnaire:  Answer theses question about your habits: How often do you have a drink containing alcohol?no Have you ever smoked? Smoked very remotely, but it has been over 30 years since smoked Do you use smokeless tobacco?n Do you use an illicit drugs?n On average, how many days per week do you engage in moderate to strenuous exercise (like a brisk walk)? In the summer walks, also plays golf - when the weather is nice a few days per week Typical diet: has cut out all fast food the last 5 years, they often fix meals at home, eats some fresh fruit, cheese, no nitrate uncured meats, eats a lots of veggies and protein with meals  Beverages:  no sodas, drinks a lot of water and some milks, occ hot tea, un-sweet ice tea  Answer theses question about your everyday activities: Can you perform most household chores?y Are you deaf or have significant trouble hearing? Some issues and planning to see audiologist Do you feel that you have a problem with memory? no Do you feel safe at home?y Last dentist visit? Sees dentist every 6 months 8. Do you have any difficulty performing your everyday activities?n Are you having any difficulty walking, taking medications on your own, and or  difficulty managing daily home needs?n Do you have difficulty walking or climbing stairs?n Do you have difficulty dressing or bathing?n Do you have difficulty doing errands alone such as visiting a doctor's office or shopping?n Do you currently have any difficulty preparing food and eating?n Do you currently have any difficulty using the toilet?n Do you have any difficulty managing your finances?n Do you have any difficulties with housekeeping of managing your housekeeping?n   Do you have Advanced Directives in place (Living Will, Healthcare Power or Attorney)? y   Last eye Exam and location? [facility], goes once a year   Do you currently use prescribed or non-prescribed narcotic or opioid pain medications?n      ----------------------------------------------------------------------------------------------------------------------------------------------------------------------------------------------------------------------  Because this visit was a virtual/telehealth visit, some criteria may be missing or patient reported. Any vitals not documented were not able to be obtained and vitals that have been documented are patient reported.    MEDICARE ANNUAL PREVENTIVE CARE VISIT WITH PROVIDER (Welcome to Medicare, initial annual wellness or annual wellness exam)  Virtual Visit via Video Note  I connected with Troy Howard on 01/25/24  By a video enabled telemedicine application and verified that I am speaking with the correct person using two identifiers.  Location patient: home Location provider:work or home office Persons participating in the virtual visit: patient, provider  Concerns and/or follow up today: reports all is stable   See HM section in Epic for other details of completed HM.    ROS: negative for report of fevers, unintentional weight loss, vision changes, vision loss,  hearing loss or change, chest pain, sob, hemoptysis, melena, hematochezia, hematuria,  falls, bleeding or bruising, thoughts of suicide or self harm, memory loss  Patient-completed extensive health risk assessment - reviewed and discussed with the patient: See Health Risk Assessment completed with patient prior to the visit either above or in recent phone note. This was reviewed in detailed with the patient today and appropriate recommendations, orders and referrals were placed as needed per Summary below and patient instructions.   Review of Medical History: -PMH, PSH, Family History and current specialty and care providers reviewed and updated and listed below   Patient Care Team: Nelwyn Salisbury, MD as PCP - General (Family Medicine) Sherrill Raring, [facility] (Pharmacist)   Past Medical History:  Diagnosis Date   Arthritis    back   Cancer [facility])    prostate, sees Dr. Heloise Purpura    Chronic kidney disease 2017   stage 3 kidney disease   Diabetes mellitus    Erectile dysfunction    GERD (gastroesophageal reflux disease)    Hyperlipidemia    Hypertension    Migraines    Peptic ulcer    Thyroid disease     Past Surgical History:  Procedure Laterality Date   APPENDECTOMY  1950   COLONOSCOPY  05/22/2021   per Dr. Russella Dar, benign polyps, no repeats needed   HERNIA REPAIR     umbilical    MENISCUS REPAIR Left    PROSTATE SURGERY  2009   radical prostatectomy   SPINE SURGERY     L4-L5 fusion per Dr. Trey Sailors    TONSILLECTOMY AND ADENOIDECTOMY  1954    Social History   Socioeconomic History   Marital status: Married    Spouse name: Not on file   Number of children: Not on file   Years of education: Not on file   Highest education level: Some college, no degree  Occupational History   Not on file  Tobacco Use   Smoking status: Former    Current packs/day: 0.00    Average packs/day: 0.3 packs/day for 5.0 years (1.3 ttl pk-yrs)    Types: Cigarettes    Start date: 9    Quit date: 92    Years since quitting: 58.1   Smokeless tobacco: Never  Vaping  Use   Vaping status: Never Used  Substance and Sexual Activity   Alcohol use: Not Currently    Comment: rare one glass every 6 months   Drug use: No   Sexual activity: Yes  Other Topics Concern   Not on file  Social History Narrative   Not on file   Social Drivers of Health   Financial Resource Strain: Low Risk  (07/14/2022)   Overall Financial Resource Strain (CARDIA)    Difficulty of Paying Living Expenses: Not hard at all  Food Insecurity: No Food Insecurity (03/15/2023)   Hunger Vital Sign    Worried About Running Out of Food in the Last Year: Never true    Ran Out of Food in the Last Year: Never true  Transportation Needs: No Transportation Needs (03/15/2023)   PRAPARE - Administrator, Civil Service (Medical): No    Lack of Transportation (Non-Medical): No  Physical Activity: Insufficiently Active (03/15/2023)   Exercise Vital Sign    Days of Exercise per Week: 3 days    Minutes of Exercise per Session: 20 min  Stress: Stress Concern Present (03/15/2023)   [employer] of Occupational Health - Occupational Stress Questionnaire  Feeling of Stress : To some extent  Social Connections: Moderately Isolated (03/15/2023)   Social Connection and Isolation Panel [NHANES]    Frequency of Communication with Friends and Family: Three times a week    Frequency of Social Gatherings with Friends and Family: Once a week    Attends Religious Services: Never    Database administrator or Organizations: No    Attends Engineer, structural: Not on file    Marital Status: Married  Catering manager Violence: Not At Risk (01/15/2022)   Humiliation, Afraid, Rape, and Kick questionnaire    Fear of Current or Ex-Partner: No    Emotionally Abused: No    Physically Abused: No    Sexually Abused: No    Family History  Problem Relation Age of Onset   Hypertension Other    Cancer Other    Colon cancer Neg Hx    Colon polyps Neg Hx    Esophageal cancer Neg Hx    Rectal  cancer Neg Hx    Stomach cancer Neg Hx     Current Outpatient Medications on File Prior to Visit  Medication Sig Dispense Refill   aspirin-acetaminophen-caffeine (EXCEDRIN MIGRAINE) 250-250-65 MG tablet Take 1 tablet by mouth every 8 (eight) hours as needed for migraine.     atorvastatin (LIPITOR) 10 MG tablet Take 1 tablet (10 mg total) by mouth daily. 90 tablet 3   Blood Glucose Monitoring Suppl (ONE TOUCH ULTRA 2) w/Device KIT Use to check blood sugars once daily. 1 kit 0   canagliflozin (INVOKANA) 300 MG TABS tablet Take 1 tablet (300 mg total) by mouth daily before breakfast. 90 tablet 3   CHELATED ZINC PO Take 1 capsule by mouth daily.     Cholecalciferol (VITAMIN D3) 2000 units capsule Take 2,000 Units by mouth daily.      Cyanocobalamin (VITAMIN B 12 PO) Take 2,500 mcg by mouth daily.      esomeprazole (NEXIUM) 40 MG capsule Take 1 capsule (40 mg total) by mouth daily. 90 capsule 3   FOLIC ACID PO Take 800 mcg by mouth daily.      glipiZIDE (GLUCOTROL) 5 MG tablet Take 1 tablet (5 mg total) by mouth 2 (two) times daily before a meal. 180 tablet 3   glucose blood (ONETOUCH VERIO) test strip USE ONCE A DAY TO TEST BLOOD SUGAR 100 strip 1   GNP GARLIC EXTRACT PO Take 800 mg by mouth daily.     Levomefolate Glucosamine (METHYLFOLATE PO) Take 1 tablet by mouth daily.     olmesartan (BENICAR) 40 MG tablet Take 1 tablet (40 mg total) by mouth daily. 90 tablet 3   Omega-3 Fatty Acids (FISH OIL) 1000 MG CAPS Take by mouth daily.      OneTouch Delica Lancets 30G MISC USE ONCE A DAY WHEN CHECKING BLOOD SUGAR 100 each 3   pyridOXINE (VITAMIN B-6) 100 MG tablet Take 100 mg by mouth daily.     SUMAtriptan (IMITREX) 100 MG tablet Take 1 tablet (100 mg total) by mouth as needed for migraine. May repeat in 2 hours if headache persists or recurs. 10 tablet 5   SYNTHROID 137 MCG tablet Take 1 tablet (137 mcg total) by mouth daily before breakfast. 90 tablet 3   traZODone (DESYREL) 100 MG tablet Take 1  tablet (100 mg total) by mouth at bedtime. 90 tablet 3   No current facility-administered medications on file prior to visit.    No Known Allergies  Physical Exam Vitals requested from patient and listed below if patient had equipment and was able to obtain at home for this virtual visit: Vitals:   01/25/24 1043  BP: 125/64  Temp: (!) 97.4 F (36.3 C)   Estimated body mass index is 26.36 kg/m as calculated from the following:   Height as of 11/02/23: 5\' 11"  (1.803 m).   Weight as of 11/02/23: 189 lb (85.7 kg).  EKG (optional): deferred due to virtual visit  GENERAL: alert, oriented, no acute distress detected; full vision exam deferred due to pandemic and/or virtual encounter  HEENT: atraumatic, conjunttiva clear, no obvious abnormalities on inspection of external nose and ears  NECK: normal movements of the head and neck  LUNGS: on inspection no signs of respiratory distress, breathing rate appears normal, no obvious gross SOB, gasping or wheezing  CV: no obvious cyanosis  MS: moves all visible extremities without noticeable abnormality  PSYCH/NEURO: pleasant and cooperative, no obvious depression or anxiety, speech and thought processing grossly intact, Cognitive function grossly intact  Flowsheet Row Office Visit from 03/16/2023 in [facility] [place]  PHQ-9 Total Score 3           01/25/2024   10:48 AM 03/16/2023    8:41 AM 01/19/2023    4:05 PM 10/30/2022    8:32 AM 01/15/2022    9:03 AM  Depression screen PHQ 2/9  Decreased Interest 0 0 0 0 0  Down, Depressed, Hopeless 0 0 0 0 0  PHQ - 2 Score 0 0 0 0 0  Altered sleeping  3 0 3   Tired, decreased energy  0 0 0   Change in appetite  0 0 0   Feeling bad or failure about yourself   0 0 0   Trouble concentrating  0 0 0   Moving slowly or fidgety/restless  0 0 0   Suicidal thoughts  0 0 0   PHQ-9 Score  3 0 3   Difficult doing work/chores   Not difficult at all Not difficult at all         10/30/2022    8:31 AM 01/19/2023    4:05 PM 03/15/2023    1:53 PM 03/16/2023    8:41 AM 01/25/2024   10:48 AM  Fall Risk  Falls in the past year? 0 0 0 0 0  Was there an injury with Fall? 0 0  0 0  Fall Risk Category Calculator 0 0  0 0  Fall Risk Category (Retired) Low      (RETIRED) Patient Fall Risk Level Low fall risk      Patient at Risk for Falls Due to No Fall Risks   No Fall Risks   Fall risk Follow up Falls evaluation completed   Falls evaluation completed Falls evaluation completed;Education provided     SUMMARY AND PLAN:  Encounter for Medicare annual wellness exam   Discussed applicable health maintenance/preventive health measures and advised and referred or ordered per patient preferences: -discussed foot care, advised to check feet daily and do diabetic foot exam in the office once a year which he agrees to do when seeing [name] -discussed current recommendations for Covid19 vaccines, he did one in the fall at his pharmacy per his report Health Maintenance  Topic Date Due   FOOT EXAM  10/23/2020   COVID-19 Vaccine (5 - 2024-25 season) 08/01/2023   HEMOGLOBIN A1C  05/02/2024   OPHTHALMOLOGY EXAM  09/20/2024   Diabetic kidney evaluation -  eGFR measurement  12/28/2024   Diabetic kidney evaluation - Urine ACR  12/28/2024   Medicare Annual Wellness (AWV)  01/24/2025   Pneumonia Vaccine 75+ Years old  Completed   INFLUENZA VACCINE  Completed   Zoster Vaccines- Shingrix  Completed   HPV VACCINES  Aged Out   DTaP/Tdap/Td  Discontinued   Colonoscopy  Discontinued     Education and counseling on the following was provided based on the above review of health and a plan/checklist for the patient, along with additional information discussed, was provided for the patient in the patient instructions :  -Provided counseling and plan for difficulty hearing , he plans to consider audiology evaluation at [facility] or elsewhere - not quite ready -Provided safe balance  exercises that can be done at home to improve balance and discussed exercise guidelines for adults with include balance exercises at least 3 days per week.  -Advised and counseled on a healthy lifestyle - including the importance of a healthy diet, regular physical activity, social connections  -Reviewed patient's current diet. Advised and counseled on a whole foods based healthy diet. A summary of a healthy diet was provided in the Patient Instructions.  -reviewed patient's current physical activity level and discussed exercise guidelines for adults. Discussed community resources and ideas for safe exercise at home to assist in meeting exercise guideline recommendations in a safe and healthy way. Encouraged to find ways to exercise during the winter and discussed various ideas. Also encouraged to add some strength training 2x per week and discussed ideas. He has the silvers sneakers card.  -Advise yearly dental visits at minimum and regular eye exams   Follow up: see patient instructions   Patient Instructions  I really enjoyed getting to talk with you today! I am available on Tuesdays and Thursdays for virtual visits if you have any questions or concerns, or if I can be of any further assistance.   CHECKLIST FROM ANNUAL WELLNESS VISIT:  -Follow up (please call to schedule if not scheduled after visit):   -yearly for annual wellness visit with primary care office  Here is a list of your preventive care/health maintenance measures and the plan for each if any are due:  PLAN For any measures below that may be due:    Health Maintenance  Topic Date Due   FOOT EXAM  10/23/2020- please check your feet daily and seek care if any wounds or cracks or issues. Please do exam in the office the next time you come in for follow up.    COVID-19 Vaccine (5 - 2024-25 season) 08/01/2023 - can get at the pharmacy if you wish to do.    HEMOGLOBIN A1C  05/02/2024   OPHTHALMOLOGY EXAM  09/20/2024   Diabetic  kidney evaluation - eGFR measurement  12/28/2024   Diabetic kidney evaluation - Urine ACR  12/28/2024   Medicare Annual Wellness (AWV)  01/24/2025   Pneumonia Vaccine 63+ Years old  Completed   INFLUENZA VACCINE  Completed   Zoster Vaccines- Shingrix  Completed   HPV VACCINES  Aged Out   DTaP/Tdap/Td  Discontinued   Colonoscopy  Discontinued    -See a dentist at least yearly  -Get your eyes checked and then per your eye specialist's recommendations  -Other issues addressed today:   -I have included below further information regarding a healthy whole foods based diet, physical activity guidelines for adults, stress management and opportunities for social connections. I hope you find this information useful.   -----------------------------------------------------------------------------------------------------------------------------------------------------------------------------------------------------------------------------------------------------------  NUTRITION: -eat real food: lots of colorful vegetables (half the plate) and fruits -5-7 servings of vegetables and fruits per day (fresh or steamed is best), exp. 2 servings of vegetables with lunch and dinner and 2 servings of fruit per day. Berries and greens such as kale and collards are great choices.  -consume on a regular basis:  fresh fruits, fresh veggies, fish, nuts, seeds, healthy oils (such as olive oil, avocado oil), whole grains (make sure for bread/pasta/crackers/etc., that the first ingredient on label contains the word "whole"), legumes. -can eat small amounts of dairy and lean meat (no larger than the palm of your hand), but avoid processed meats such as ham, bacon, lunch meat, etc. -drink water -try to avoid fast food and pre-packaged foods, processed meat, ultra processed foods/beverages (donuts, candy, etc.) -most experts advise limiting sodium to < 2300mg  per day, should limit further is any chronic conditions  such as high blood pressure, heart disease, diabetes, etc. The American Heart Association advised that < 1500mg  is is ideal -try to avoid foods/beverages that contain any ingredients with names you do not recognize  -try to avoid foods/beverages  with added sugar or sweeteners/sweets  -try to avoid sweet drinks (including diet drinks): soda, juice, Gatorade, sweet tea, power drinks, diet drinks -try to avoid white rice, white bread, pasta (unless whole grain)  EXERCISE GUIDELINES FOR ADULTS: -if you wish to increase your physical activity, do so gradually and with the approval of your doctor -STOP and seek medical care immediately if you have any chest pain, chest discomfort or trouble breathing when starting or increasing exercise  -move and stretch your body, legs, feet and arms when sitting for long periods -Physical activity guidelines for optimal health in adults: -get at least 150 minutes per week of moderate exercise (can talk, but not sing); this is about 20-30 minutes of sustained activity 5-7 days per week or two 10-15 minute episodes of sustained activity 5-7 days per week -do some muscle building/resistance training/strength training at least 2 days per week  -balance exercises 3+ days per week:   Stand somewhere where you have something sturdy to hold onto if you lose balance    1) lift up on toes, then back down, start with 5x per day and work up to 20x   2) stand and lift one leg straight out to the side so that foot is a few inches of the floor, start with 5x each side and work up to 20x each side   3) stand on one foot, start with 5 seconds each side and work up to 20 seconds on each side  If you need ideas or help with getting more active:  -Silver sneakers https://tools.silversneakers.com  -Walk with a Doc: http://www.duncan-williams.com/  -try to include resistance (weight lifting/strength building) and balance exercises twice per week: or the following link for  ideas: http://castillo-powell.com/  BuyDucts.dk  STRESS MANAGEMENT: -can try meditating, or just sitting quietly with deep breathing while intentionally relaxing all parts of your body for 5 minutes daily -if you need further help with stress, anxiety or depression please follow up with your primary doctor or contact the wonderful folks at [facility]: [phone]  SOCIAL CONNECTIONS: -options in [place] if you wish to engage in more social and exercise related activities:  -Silver sneakers https://tools.silversneakers.com  -Walk with a Doc: http://www.duncan-williams.com/  -Check out the [facility] Active Adults 50+ section on the [place] [employer] (hiking clubs, book clubs, cards and games, chess, exercise classes,  aquatic classes and much more) - see the website for details: https://www.[place].gov/departments/parks-recreation/active-adults50  -YouTube has lots of exercise videos for different ages and abilities as well  -[facility] (a variety of indoor and outdoor inperson activities for adults). [phone]. [address]  -Virtual Online Classes (a variety of topics): see seniorplanet.org or call [phone]  -consider volunteering at a school, hospice center, church, senior center or elsewhere            Terressa Koyanagi, DO

## 2024-01-25 NOTE — Patient Instructions (Signed)
 I really enjoyed getting to talk with you today! I am available on Tuesdays and Thursdays for virtual visits if you have any questions or concerns, or if I can be of any further assistance.   CHECKLIST FROM ANNUAL WELLNESS VISIT:  -Follow up (please call to schedule if not scheduled after visit):   -yearly for annual wellness visit with primary care office  Here is a list of your preventive care/health maintenance measures and the plan for each if any are due:  PLAN For any measures below that may be due:    Health Maintenance  Topic Date Due   FOOT EXAM  10/23/2020- please check your feet daily and seek care if any wounds or cracks or issues. Please do exam in the office the next time you come in for follow up.    COVID-19 Vaccine (5 - 2024-25 season) 08/01/2023 - can get at the pharmacy if you wish to do.    HEMOGLOBIN A1C  05/02/2024   OPHTHALMOLOGY EXAM  09/20/2024   Diabetic kidney evaluation - eGFR measurement  12/28/2024   Diabetic kidney evaluation - Urine ACR  12/28/2024   Medicare Annual Wellness (AWV)  01/24/2025   Pneumonia Vaccine 57+ Years old  Completed   INFLUENZA VACCINE  Completed   Zoster Vaccines- Shingrix  Completed   HPV VACCINES  Aged Out   DTaP/Tdap/Td  Discontinued   Colonoscopy  Discontinued    -See a dentist at least yearly  -Get your eyes checked and then per your eye specialist's recommendations  -Other issues addressed today:   -I have included below further information regarding a healthy whole foods based diet, physical activity guidelines for adults, stress management and opportunities for social connections. I hope you find this information useful.   -----------------------------------------------------------------------------------------------------------------------------------------------------------------------------------------------------------------------------------------------------------    NUTRITION: -eat real food: lots of  colorful vegetables (half the plate) and fruits -5-7 servings of vegetables and fruits per day (fresh or steamed is best), exp. 2 servings of vegetables with lunch and dinner and 2 servings of fruit per day. Berries and greens such as kale and collards are great choices.  -consume on a regular basis:  fresh fruits, fresh veggies, fish, nuts, seeds, healthy oils (such as olive oil, avocado oil), whole grains (make sure for bread/pasta/crackers/etc., that the first ingredient on label contains the word "whole"), legumes. -can eat small amounts of dairy and lean meat (no larger than the palm of your hand), but avoid processed meats such as ham, bacon, lunch meat, etc. -drink water -try to avoid fast food and pre-packaged foods, processed meat, ultra processed foods/beverages (donuts, candy, etc.) -most experts advise limiting sodium to < 2300mg  per day, should limit further is any chronic conditions such as high blood pressure, heart disease, diabetes, etc. The American Heart Association advised that < 1500mg  is is ideal -try to avoid foods/beverages that contain any ingredients with names you do not recognize  -try to avoid foods/beverages  with added sugar or sweeteners/sweets  -try to avoid sweet drinks (including diet drinks): soda, juice, Gatorade, sweet tea, power drinks, diet drinks -try to avoid white rice, white bread, pasta (unless whole grain)  EXERCISE GUIDELINES FOR ADULTS: -if you wish to increase your physical activity, do so gradually and with the approval of your doctor -STOP and seek medical care immediately if you have any chest pain, chest discomfort or trouble breathing when starting or increasing exercise  -move and stretch your body, legs, feet and arms when sitting for long periods -Physical activity guidelines  for optimal health in adults: -get at least 150 minutes per week of moderate exercise (can talk, but not sing); this is about 20-30 minutes of sustained activity 5-7 days  per week or two 10-15 minute episodes of sustained activity 5-7 days per week -do some muscle building/resistance training/strength training at least 2 days per week  -balance exercises 3+ days per week:   Stand somewhere where you have something sturdy to hold onto if you lose balance    1) lift up on toes, then back down, start with 5x per day and work up to 20x   2) stand and lift one leg straight out to the side so that foot is a few inches of the floor, start with 5x each side and work up to 20x each side   3) stand on one foot, start with 5 seconds each side and work up to 20 seconds on each side  If you need ideas or help with getting more active:  -Silver sneakers https://tools.silversneakers.com  -Walk with a Doc: http://www.duncan-williams.com/  -try to include resistance (weight lifting/strength building) and balance exercises twice per week: or the following link for ideas: http://castillo-powell.com/  BuyDucts.dk  STRESS MANAGEMENT: -can try meditating, or just sitting quietly with deep breathing while intentionally relaxing all parts of your body for 5 minutes daily -if you need further help with stress, anxiety or depression please follow up with your primary doctor or contact the wonderful folks at [facility]: [phone]  SOCIAL CONNECTIONS: -options in [place] if you wish to engage in more social and exercise related activities:  -Silver sneakers https://tools.silversneakers.com  -Walk with a Doc: http://www.duncan-williams.com/  -Check out the [facility] Active Adults 50+ section on the [place] [employer] (hiking clubs, book clubs, cards and games, chess, exercise classes, aquatic classes and much more) - see the website for details: https://www.[place].gov/departments/parks-recreation/active-adults50  -YouTube has lots of exercise videos for different  ages and abilities as well  -[facility] (a variety of indoor and outdoor inperson activities for adults). [phone]. [address]  -Virtual Online Classes (a variety of topics): see seniorplanet.org or call [phone]  -consider volunteering at a school, hospice center, church, senior center or elsewhere

## 2024-02-08 ENCOUNTER — Ambulatory Visit: Payer: Self-pay | Admitting: Family Medicine

## 2024-02-08 ENCOUNTER — Other Ambulatory Visit: Payer: Self-pay

## 2024-02-08 ENCOUNTER — Ambulatory Visit (INDEPENDENT_AMBULATORY_CARE_PROVIDER_SITE_OTHER): Admitting: Family Medicine

## 2024-02-08 ENCOUNTER — Encounter: Payer: Self-pay | Admitting: Family Medicine

## 2024-02-08 VITALS — BP 139/64 | HR 86 | Temp 97.6°F | Wt 191.6 lb

## 2024-02-08 DIAGNOSIS — N390 Urinary tract infection, site not specified: Secondary | ICD-10-CM | POA: Diagnosis not present

## 2024-02-08 DIAGNOSIS — R319 Hematuria, unspecified: Secondary | ICD-10-CM

## 2024-02-08 DIAGNOSIS — R300 Dysuria: Secondary | ICD-10-CM

## 2024-02-08 DIAGNOSIS — D472 Monoclonal gammopathy: Secondary | ICD-10-CM

## 2024-02-08 LAB — POC URINALSYSI DIPSTICK (AUTOMATED)
Glucose, UA: POSITIVE — AB
Protein, UA: POSITIVE — AB
Spec Grav, UA: 1.02 (ref 1.010–1.025)
Urobilinogen, UA: 0.2 U/dL
pH, UA: 5.5 (ref 5.0–8.0)

## 2024-02-08 MED ORDER — SULFAMETHOXAZOLE-TRIMETHOPRIM 800-160 MG PO TABS: 1.0000 | ORAL_TABLET | Freq: Two times a day (BID) | ORAL | 0 refills | Status: DC

## 2024-02-08 NOTE — Patient Instructions (Addendum)
-  Urinalysis was abnormal. Will start treatment with Bactrim. Take 1 tablet every 12 hours for 10 days.  -Will send urine for culture to make sure the antibiotic prescribed will cover the treatment of the bacteria that caused the urinary tract infection. -Provided general information about urinary tract infections in males. -Follow up if not improved or symptoms become worse.

## 2024-02-08 NOTE — Progress Notes (Signed)
 Acute Office Visit   Subjective:  Patient ID: Troy Howard, male    DOB: 1941/01/12, 83 y.o.   MRN: [id number]  CC: Dysuria, increase urgency    HPI Patient is here for an acute visit. He is experiencing burning with urination, more at the end of urinating, and increased in frequency that started 3 days ago. When his symptoms started his urine odor was strong.   He denies fever, hematuria, nausea, vomiting, abd pain or lower back pain.   Patient reports he has been drinking water with not taking any OTC medication for his symptoms.   ROS See HPI above      Objective:   BP 139/64   Pulse 86   Temp 97.6 F (36.4 C)   Wt 191 lb 9.6 oz (86.9 kg)   SpO2 99%   BMI 26.72 kg/m    Physical Exam Vitals reviewed.  Constitutional:      General: He is not in acute distress.    Appearance: Normal appearance. He is not ill-appearing, toxic-appearing or diaphoretic.  HENT:     Head: Normocephalic and atraumatic.  Eyes:     General:        Right eye: No discharge.        Left eye: No discharge.     Conjunctiva/sclera: Conjunctivae normal.  Cardiovascular:     Rate and Rhythm: Normal rate and regular rhythm.     Heart sounds: Normal heart sounds. No murmur heard.    No friction rub. No gallop.  Pulmonary:     Effort: Pulmonary effort is normal. No respiratory distress.     Breath sounds: Normal breath sounds.  Abdominal:     General: Bowel sounds are normal.     Palpations: Abdomen is soft.     Tenderness: There is no right CVA tenderness or left CVA tenderness.  Musculoskeletal:        General: Normal range of motion.  Skin:    General: Skin is warm and dry.  Neurological:     General: No focal deficit present.     Mental Status: He is alert and oriented to person, place, and time. Mental status is at baseline.  Psychiatric:        Mood and Affect: Mood normal.        Behavior: Behavior normal.        Thought Content: Thought content normal.        Judgment:  Judgment normal.     Results for orders placed or performed in visit on 02/08/24  POCT Urinalysis Dipstick (Automated)  Result Value Ref Range   Color, UA yellow    Clarity, UA cloudy    Glucose, UA Positive (A) Negative   Bilirubin, UA 1+    Ketones, UA -    Spec Grav, UA 1.020 1.010 - 1.025   Blood, UA 3+    pH, UA 5.5 5.0 - 8.0   Protein, UA Positive (A) Negative   Urobilinogen, UA 0.2 0.2 or 1.0 E.U./dL   Nitrite, UA -    Leukocytes, UA Large (3+) (A) Negative        Assessment & Plan:  Urinary tract infection with hematuria, site unspecified -     Urine Culture -     Sulfamethoxazole-Trimethoprim; Take 1 tablet by mouth 2 (two) times daily for 10 days.  Dispense: 20 tablet; Refill: 0  Dysuria -     POCT Urinalysis Dipstick (Automated)  -Urinalysis was abnormal. Will start treatment with Bactrim.  Take 1 tablet every 12 hours for 10 days.  -Will send urine for culture to make sure the antibiotic prescribed will cover the treatment of the bacteria that caused the urinary tract infection. -Provided general information about urinary tract infections in males. -Follow up if not improved or symptoms become worse.   No follow-ups on file.  Zandra Abts, NP

## 2024-02-08 NOTE — Telephone Encounter (Signed)
 Pt was seen at the office this morning by Dondra Spry

## 2024-02-08 NOTE — Telephone Encounter (Signed)
  Chief Complaint: burning with urination Symptoms: urinary frequency, burning with urination, strong odor to urine, mild lightheaded, fatigue Frequency: 3-4 days Pertinent Negatives: Patient denies fever, abdominal pain, nausea, vomiting, only a few drops coming out, scrotum or testicular pain or swelling, lower back pain. Disposition: [] ED /[] Urgent Care (no appt availability in office) / [x] Appointment(In office/virtual)/ []  [facility]/ [] Home Care/ [] Refused Recommended Disposition /[]  Mobile Bus/ []  Follow-up with PCP Additional Notes: Patient states he has been drinking lots of fluids. Patient states  he has been taking Tylenol for the pain. 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 Patient agreeable to acute visit today at PCP office with available provider. Patient verbalizes understanding to call back for new or worsening symptoms.  Copied from CRM [id number]. Topic: Clinical - Red Word Triage >> Feb 08, 2024  9:02 AM Adele Barthel wrote: Red Word that prompted transfer to Nurse Triage:   Burning sensation when urinating  Urinary frequency but small amounts of urine.  Started 3 to 4 days ago No abdominal pain Reason for Disposition  Diabetes mellitus or weak immune system (e.g., HIV positive, cancer chemo, splenectomy, organ transplant, chronic steroids)  Answer Assessment - Initial Assessment Questions 1. SEVERITY: "How bad is the pain?"  (e.g., Scale 1-10; mild, moderate, or severe)   - MILD (1-3): Complains slightly about urination hurting.   - MODERATE (4-7): Interferes with normal activities.     - SEVERE (8-10): Excruciating, unwilling or unable to urinate because of the pain.      8/10.  2. FREQUENCY: "How many times have you had painful urination today?"      3 times.  3. PATTERN: "Is pain present every time you urinate or just sometimes?"      Every time, constant.  4. ONSET: "When did the  painful urination start?"      X 3-4 days.  5. FEVER: "Do you have a fever?" If Yes, ask: "What is your temperature, how was it measured, and when did it start?"     Denies, he states his last temperature was 98.1.  6. PAST UTI: "Have you had a urine infection before?" If Yes, ask: "When was the last time?" and "What happened that time?"      Denies history of UTI or kidney stones.  7. 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  8. OTHER SYMPTOMS: "Do you have any other symptoms?" (e.g., flank pain, penis discharge, scrotal pain, blood in urine)     Strong odor to urine, mild lightheaded, fatigue.  Protocols used: Urination Pain - Male-A-AH

## 2024-02-09 ENCOUNTER — Inpatient Hospital Stay: Payer: PPO | Attending: Hematology

## 2024-02-09 DIAGNOSIS — C9000 Multiple myeloma not having achieved remission: Secondary | ICD-10-CM | POA: Diagnosis not present

## 2024-02-09 DIAGNOSIS — N183 Chronic kidney disease, stage 3 unspecified: Secondary | ICD-10-CM | POA: Diagnosis not present

## 2024-02-09 DIAGNOSIS — E1122 Type 2 diabetes mellitus with diabetic chronic kidney disease: Secondary | ICD-10-CM | POA: Diagnosis not present

## 2024-02-09 DIAGNOSIS — Z79899 Other long term (current) drug therapy: Secondary | ICD-10-CM | POA: Diagnosis not present

## 2024-02-09 DIAGNOSIS — Z7984 Long term (current) use of oral hypoglycemic drugs: Secondary | ICD-10-CM | POA: Diagnosis not present

## 2024-02-09 DIAGNOSIS — D472 Monoclonal gammopathy: Secondary | ICD-10-CM

## 2024-02-09 DIAGNOSIS — I129 Hypertensive chronic kidney disease with stage 1 through stage 4 chronic kidney disease, or unspecified chronic kidney disease: Secondary | ICD-10-CM | POA: Diagnosis not present

## 2024-02-09 DIAGNOSIS — E538 Deficiency of other specified B group vitamins: Secondary | ICD-10-CM | POA: Diagnosis not present

## 2024-02-09 LAB — CMP (CANCER CENTER ONLY)
ALT: 7 U/L (ref 0–44)
AST: 8 U/L — ABNORMAL LOW (ref 15–41)
Albumin: 3.5 g/dL (ref 3.5–5.0)
Alkaline Phosphatase: 75 U/L (ref 38–126)
Anion gap: 3 — ABNORMAL LOW (ref 5–15)
BUN: 37 mg/dL — ABNORMAL HIGH (ref 8–23)
CO2: 22 mmol/L (ref 22–32)
Calcium: 8.5 mg/dL — ABNORMAL LOW (ref 8.9–10.3)
Chloride: 110 mmol/L (ref 98–111)
Creatinine: 2.07 mg/dL — ABNORMAL HIGH (ref 0.61–1.24)
GFR, Estimated: 32 mL/min — ABNORMAL LOW (ref >60)
Glucose, Bld: 90 mg/dL (ref 70–99)
Potassium: 4.3 mmol/L (ref 3.5–5.1)
Sodium: 135 mmol/L (ref 135–145)
Total Bilirubin: 0.4 mg/dL (ref 0.0–1.2)
Total Protein: 9 g/dL — ABNORMAL HIGH (ref 6.5–8.1)

## 2024-02-09 LAB — CBC WITH DIFFERENTIAL (CANCER CENTER ONLY)
Abs Immature Granulocytes: 0.01 10*3/uL (ref 0.00–0.07)
Basophils Absolute: 0 10*3/uL (ref 0.0–0.1)
Basophils Relative: 0 %
Eosinophils Absolute: 0.1 10*3/uL (ref 0.0–0.5)
Eosinophils Relative: 2 %
HCT: 33.7 % — ABNORMAL LOW (ref 39.0–52.0)
Hemoglobin: 11.2 g/dL — ABNORMAL LOW (ref 13.0–17.0)
Immature Granulocytes: 0 %
Lymphocytes Relative: 39 %
Lymphs Abs: 1.3 10*3/uL (ref 0.7–4.0)
MCH: 30.9 pg (ref 26.0–34.0)
MCHC: 33.2 g/dL (ref 30.0–36.0)
MCV: 93.1 fL (ref 80.0–100.0)
Monocytes Absolute: 0.5 10*3/uL (ref 0.1–1.0)
Monocytes Relative: 14 %
Neutro Abs: 1.5 10*3/uL — ABNORMAL LOW (ref 1.7–7.7)
Neutrophils Relative %: 45 %
Platelet Count: 225 10*3/uL (ref 150–400)
RBC: 3.62 MIL/uL — ABNORMAL LOW (ref 4.22–5.81)
RDW: 13.2 % (ref 11.5–15.5)
WBC Count: 3.3 10*3/uL — ABNORMAL LOW (ref 4.0–10.5)
nRBC: 0 % (ref 0.0–0.2)

## 2024-02-10 ENCOUNTER — Encounter: Payer: Self-pay | Admitting: Family Medicine

## 2024-02-10 LAB — URINE CULTURE
MICRO NUMBER:: 16186308
SPECIMEN QUALITY:: ADEQUATE

## 2024-02-10 LAB — KAPPA/LAMBDA LIGHT CHAINS
Kappa free light chain: 15.5 mg/L (ref 3.3–19.4)
Kappa, lambda light chain ratio: 0.13 — ABNORMAL LOW (ref 0.26–1.65)
Lambda free light chains: 116.8 mg/L — ABNORMAL HIGH (ref 5.7–26.3)

## 2024-02-11 LAB — MULTIPLE MYELOMA PANEL, SERUM
Albumin SerPl Elph-Mcnc: 3.3 g/dL (ref 2.9–4.4)
Albumin/Glob SerPl: 0.7 (ref 0.7–1.7)
Alpha 1: 0.3 g/dL (ref 0.0–0.4)
Alpha2 Glob SerPl Elph-Mcnc: 1 g/dL (ref 0.4–1.0)
B-Globulin SerPl Elph-Mcnc: 0.8 g/dL (ref 0.7–1.3)
Gamma Glob SerPl Elph-Mcnc: 3 g/dL — ABNORMAL HIGH (ref 0.4–1.8)
Globulin, Total: 5 g/dL — ABNORMAL HIGH (ref 2.2–3.9)
IgA: 37 mg/dL — ABNORMAL LOW (ref 61–437)
IgG (Immunoglobin G), Serum: 4280 mg/dL — ABNORMAL HIGH (ref 603–1613)
IgM (Immunoglobulin M), Srm: 16 mg/dL (ref 15–143)
M Protein SerPl Elph-Mcnc: 2.8 g/dL — ABNORMAL HIGH
Total Protein ELP: 8.3 g/dL (ref 6.0–8.5)

## 2024-02-16 ENCOUNTER — Inpatient Hospital Stay (HOSPITAL_BASED_OUTPATIENT_CLINIC_OR_DEPARTMENT_OTHER): Payer: PPO | Admitting: Hematology

## 2024-02-16 DIAGNOSIS — D472 Monoclonal gammopathy: Secondary | ICD-10-CM

## 2024-02-16 DIAGNOSIS — C9000 Multiple myeloma not having achieved remission: Secondary | ICD-10-CM | POA: Diagnosis not present

## 2024-02-16 NOTE — Progress Notes (Signed)
 HEMATOLOGY/ONCOLOGY PHONE VISIT NOTE  Date of Service: 02/16/2024   Patient Care Team: Nelwyn Salisbury, MD as PCP - General (Family Medicine) Leitha Bleak, Milas Kocher, [facility] (Pharmacist)  CHIEF COMPLAINTS/PURPOSE OF CONSULTATION:  Follow-up for continued evaluation and management of smoldering myeloma  HISTORY OF PRESENTING ILLNESS:   Please see previous note for details on initial presentation  Interval History:   Troy Howard is a 83 y.o. male who is here for continued evaluation and management of IgG Lambda smoldering myeloma.   Patient was last seen by me on 11/02/2023 and complained of right hip pain following his bone marrow biopsy, worsened fatigue/lethargy, chronic , migraines, 50 pound weight-loss over two years, and intermittent bilateral knee pain.   I connected with [name] on 02/16/24 at  8:40 AM EDT by telephone visit and verified that I am speaking with the correct person using two identifiers.   I discussed the limitations, risks, security and privacy concerns of performing an evaluation and management service by telemedicine and the availability of in-person appointments. I also discussed with the patient that there may be a patient responsible charge related to this service. The patient expressed understanding and agreed to proceed.   Other persons participating in the visit and their role in the encounter: none   Patient's location: home  Provider's location: [facility]   Chief Complaint: IgG Lambda smoldering myeloma     Today, patient reports no acute new symptoms. He denies any focal bone pain, fever, chills, or night sweats.   Patient complains of mild chronic fatigue, which is unchanged from his last visit.   He was noted to have a recent urinary tract infection with E coli and is on bactrim, managed by his PCP. Patient reports improvement in his urinary symptoms.   MEDICAL HISTORY:  Past Medical History:  Diagnosis Date   Arthritis    back    Cancer [facility])    prostate, sees Dr. Heloise Purpura    Chronic kidney disease 2017   stage 3 kidney disease   Diabetes mellitus    Erectile dysfunction    GERD (gastroesophageal reflux disease)    Hyperlipidemia    Hypertension    Migraines    Peptic ulcer    Thyroid disease     SURGICAL HISTORY: Past Surgical History:  Procedure Laterality Date   APPENDECTOMY  1950   COLONOSCOPY  05/22/2021   per Dr. Russella Dar, benign polyps, no repeats needed   HERNIA REPAIR     umbilical    MENISCUS REPAIR Left    PROSTATE SURGERY  2009   radical prostatectomy   SPINE SURGERY     L4-L5 fusion per Dr. Trey Sailors    TONSILLECTOMY AND ADENOIDECTOMY  1954    SOCIAL HISTORY: Social History   Socioeconomic History   Marital status: Married    Spouse name: Not on file   Number of children: Not on file   Years of education: Not on file   Highest education level: Some college, no degree  Occupational History   Not on file  Tobacco Use   Smoking status: Former    Current packs/day: 0.00    Average packs/day: 0.3 packs/day for 5.0 years (1.3 ttl pk-yrs)    Types: Cigarettes    Start date: 32    Quit date: 19    Years since quitting: 58.2   Smokeless tobacco: Never  Vaping Use   Vaping status: Never Used  Substance and Sexual Activity   Alcohol  use: Not Currently    Comment: rare one glass every 6 months   Drug use: No   Sexual activity: Yes  Other Topics Concern   Not on file  Social History Narrative   Not on file   Social Drivers of Health   Financial Resource Strain: Low Risk  (07/14/2022)   Overall Financial Resource Strain (CARDIA)    Difficulty of Paying Living Expenses: Not hard at all  Food Insecurity: No Food Insecurity (03/15/2023)   Hunger Vital Sign    Worried About Running Out of Food in the Last Year: Never true    Ran Out of Food in the Last Year: Never true  Transportation Needs: No Transportation Needs (03/15/2023)   PRAPARE - Scientist, research (physical sciences) (Medical): No    Lack of Transportation (Non-Medical): No  Physical Activity: Insufficiently Active (03/15/2023)   Exercise Vital Sign    Days of Exercise per Week: 3 days    Minutes of Exercise per Session: 20 min  Stress: Stress Concern Present (03/15/2023)   [employer] of Occupational Health - Occupational Stress Questionnaire    Feeling of Stress : To some extent  Social Connections: Moderately Isolated (03/15/2023)   Social Connection and Isolation Panel [NHANES]    Frequency of Communication with Friends and Family: Three times a week    Frequency of Social Gatherings with Friends and Family: Once a week    Attends Religious Services: Never    Database administrator or Organizations: No    Attends Engineer, structural: Not on file    Marital Status: Married  Catering manager Violence: Not At Risk (01/15/2022)   Humiliation, Afraid, Rape, and Kick questionnaire    Fear of Current or Ex-Partner: No    Emotionally Abused: No    Physically Abused: No    Sexually Abused: No    FAMILY HISTORY: Family History  Problem Relation Age of Onset   Hypertension Other    Cancer Other    Colon cancer Neg Hx    Colon polyps Neg Hx    Esophageal cancer Neg Hx    Rectal cancer Neg Hx    Stomach cancer Neg Hx     ALLERGIES:  has no known allergies.  MEDICATIONS:  Current Outpatient Medications  Medication Sig Dispense Refill   aspirin-acetaminophen-caffeine (EXCEDRIN MIGRAINE) 250-250-65 MG tablet Take 1 tablet by mouth every 8 (eight) hours as needed for migraine.     atorvastatin (LIPITOR) 10 MG tablet Take 1 tablet (10 mg total) by mouth daily. 90 tablet 3   Blood Glucose Monitoring Suppl (ONE TOUCH ULTRA 2) w/Device KIT Use to check blood sugars once daily. 1 kit 0   canagliflozin (INVOKANA) 300 MG TABS tablet Take 1 tablet (300 mg total) by mouth daily before breakfast. 90 tablet 3   CHELATED ZINC PO Take 1 capsule by mouth daily.     Cholecalciferol  (VITAMIN D3) 2000 units capsule Take 2,000 Units by mouth daily.      Cyanocobalamin (VITAMIN B 12 PO) Take 2,500 mcg by mouth daily.      esomeprazole (NEXIUM) 40 MG capsule Take 1 capsule (40 mg total) by mouth daily. 90 capsule 3   FOLIC ACID PO Take 800 mcg by mouth daily.      glipiZIDE (GLUCOTROL) 5 MG tablet Take 1 tablet (5 mg total) by mouth 2 (two) times daily before a meal. 180 tablet 3   glucose blood (ONETOUCH VERIO) test strip USE ONCE  A DAY TO TEST BLOOD SUGAR 100 strip 1   Levomefolate Glucosamine (METHYLFOLATE PO) Take 1 tablet by mouth daily.     olmesartan (BENICAR) 40 MG tablet Take 1 tablet (40 mg total) by mouth daily. 90 tablet 3   Omega-3 Fatty Acids (FISH OIL) 1000 MG CAPS Take by mouth daily.      OneTouch Delica Lancets 30G MISC USE ONCE A DAY WHEN CHECKING BLOOD SUGAR 100 each 3   pyridOXINE (VITAMIN B-6) 100 MG tablet Take 100 mg by mouth daily.     sulfamethoxazole-trimethoprim (BACTRIM DS) 800-160 MG tablet Take 1 tablet by mouth 2 (two) times daily for 10 days. 20 tablet 0   SUMAtriptan (IMITREX) 100 MG tablet Take 1 tablet (100 mg total) by mouth as needed for migraine. May repeat in 2 hours if headache persists or recurs. 10 tablet 5   SYNTHROID 137 MCG tablet Take 1 tablet (137 mcg total) by mouth daily before breakfast. 90 tablet 3   traZODone (DESYREL) 100 MG tablet Take 1 tablet (100 mg total) by mouth at bedtime. 90 tablet 3   No current facility-administered medications for this visit.    REVIEW OF SYSTEMS:    10 Point review of Systems was done is negative except as noted above.   PHYSICAL EXAMINATION: TELEMEDICINE VISIT     LABORATORY DATA:  I have reviewed the data as listed  .    Latest Ref Rng & Units 02/09/2024    8:07 AM 11/02/2023    8:58 AM 10/12/2023    7:30 AM  CBC  WBC 4.0 - 10.5 K/uL 3.3  4.1  3.6   Hemoglobin 13.0 - 17.0 g/dL 54.0  98.1  19.1   Hematocrit 39.0 - 52.0 % 33.7  35.5  37.1   Platelets 150 - 400 K/uL 225  244.0   191    . CBC    Component Value Date/Time   WBC 3.3 (L) 02/09/2024 0807   WBC 4.1 11/02/2023 0858   RBC 3.62 (L) 02/09/2024 0807   HGB 11.2 (L) 02/09/2024 0807   HCT 33.7 (L) 02/09/2024 0807   PLT 225 02/09/2024 0807   MCV 93.1 02/09/2024 0807   MCH 30.9 02/09/2024 0807   MCHC 33.2 02/09/2024 0807   RDW 13.2 02/09/2024 0807   LYMPHSABS 1.3 02/09/2024 0807   MONOABS 0.5 02/09/2024 0807   EOSABS 0.1 02/09/2024 0807   BASOSABS 0.0 02/09/2024 0807    .    Latest Ref Rng & Units 02/09/2024    8:07 AM 11/02/2023    8:58 AM 10/11/2023    7:55 AM  CMP  Glucose 70 - 99 mg/dL 90  76  478   BUN 8 - 23 mg/dL 37  37  30   Creatinine 0.61 - 1.24 mg/dL 2.95  6.21  3.08   Sodium 135 - 145 mmol/L 135  132  134   Potassium 3.5 - 5.1 mmol/L 4.3  4.7  4.5   Chloride 98 - 111 mmol/L 110  105  103   CO2 22 - 32 mmol/L 22  25  26    Calcium 8.9 - 10.3 mg/dL 8.5  8.9  9.1   Total Protein 6.5 - 8.1 g/dL 9.0  9.1  9.5   Total Bilirubin 0.0 - 1.2 mg/dL 0.4  0.7  0.4   Alkaline Phos 38 - 126 U/L 75  70  64   AST 15 - 41 U/L 8  8  11    ALT 0 - 44  U/L 7  10  10      03/27/2021 BM Bx   DIAGNOSIS:   BONE MARROW, ASPIRATE, CLOT, CORE:  -  Hypercellular bone marrow with involvement by plasma cell neoplasm  -  See comment    COMMENT:   Plasma cells are increased on aspirate smears (8% by manual differential  count) and by CD138 immunohistochemistry on the core biopsy and clot  section (15%).  Plasma cells express lambda-restricted light chains by  in situ hybridization.  Together the findings are consistent with bone  marrow involvement by a lambda-restricted plasma cell neoplasm.  Correlation with clinical impressions, radiographic studies, other  laboratory data, and cytogenetic/FISH results is recommended.   03/27/2021 Molecular Pathology    03/27/2021 Cytogenetics    RADIOGRAPHIC STUDIES: I have personally reviewed the radiological images as listed and agreed with the findings in  the report. No results found.   ASSESSMENT & PLAN:   Troy Howard is a 83 y.o. caucasian male with    1. IgG Lambda smoldering myeloma He has a M-spike of 0.8g/dl and normal SFLC and ratio. No anemia - nl Hgb No focal bone pains --- Skeletal survey - shows no  No hypercalcemia. 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  05/10/18 Metastatic Bone Survey revealed no lytic lesions on skeletal survey to suggest multiple myeloma.   2. 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 Signe Colt  -mild non nephrotic proteinuria on 24h UPEP-Continue follow up with Dr Signe Colt for CKD    3. Vitamin B12 deficient  Antibody testing done - no evidence of pernicious anemia. No evidence of pernicious anemia based on neg IF and parietal cell ab -continue B12 replacement per PCP  4.  Past Medical History:  Diagnosis Date   Arthritis    back   Cancer [facility])    prostate, sees Dr. Heloise Purpura    Chronic kidney disease 2017   stage 3 kidney disease   Diabetes mellitus    Erectile dysfunction    GERD (gastroesophageal reflux disease)    Hyperlipidemia    Hypertension    Migraines    Peptic ulcer    Thyroid disease    PLAN:   -Discussed lab results from 02/09/2024 in detail with patient. CBC showed WBC of 3.3K, hemoglobin of 11.2, and platelets of 225K. -there is mild worsening of anemia with hgb down to 11.2, which could be from CKD -CMP shows mildly elevated kidney numbers with creatinine level of 2.07 mg/dL -Discussed that his elevated creatinine could be temporary from bactrim and would recommend drinking at least 2 liters of water daily.  -myeloma panel from 1 week ago showed stable M protein level of 2.8 g/dL, similar to 2.9 g/dL four months ago.  -Lambda light chains are slightly elevated at 116.8 mg/L in the context of mildly elevated creatinine -Labs no show no overt lab evidence or  clinical evidence of myeloma progression at this time.  -Given the slight elevation of creatinine and development of anemia, we shall monitor labs more closely and repeat labs and phone visit in 2 months -Recommend to continue close follow-up with his nephrologist, Dr. Signe Colt    FOLLOW UP: Phone visit with Dr Candise Che in 10 weeks Labs in 8 weeks  The total time spent in the appointment was 20 minutes* .  All of the patient's questions were answered with apparent  satisfaction. The patient knows to call the clinic with any problems, questions or concerns.   Wyvonnia Lora MD MS AAHIVMS [facility] Hematology/Oncology Physician [facility]  .*Total Encounter Time as defined by the Centers for Medicare and Medicaid Services includes, in addition to the face-to-face time of a patient visit (documented in the note above) non-face-to-face time: obtaining and reviewing outside history, ordering and reviewing medications, tests or procedures, care coordination (communications with other health care professionals or caregivers) and documentation in the medical record.    I,Mitra Faeizi,acting as a Neurosurgeon for Wyvonnia Lora, MD.,have documented all relevant documentation on the behalf of Wyvonnia Lora, MD,as directed by  Wyvonnia Lora, MD while in the presence of Wyvonnia Lora, MD.  .I have reviewed the above documentation for accuracy and completeness, and I agree with the above. [name]

## 2024-03-23 ENCOUNTER — Other Ambulatory Visit: Payer: Self-pay | Admitting: Family Medicine

## 2024-03-31 ENCOUNTER — Other Ambulatory Visit: Payer: Self-pay | Admitting: Family Medicine

## 2024-03-31 DIAGNOSIS — E119 Type 2 diabetes mellitus without complications: Secondary | ICD-10-CM

## 2024-04-06 ENCOUNTER — Encounter: Payer: Self-pay | Admitting: Family Medicine

## 2024-04-06 ENCOUNTER — Ambulatory Visit (INDEPENDENT_AMBULATORY_CARE_PROVIDER_SITE_OTHER): Admitting: Family Medicine

## 2024-04-06 VITALS — BP 110/64 | HR 78 | Temp 98.1°F | Wt 187.6 lb

## 2024-04-06 DIAGNOSIS — K5732 Diverticulitis of large intestine without perforation or abscess without bleeding: Secondary | ICD-10-CM

## 2024-04-06 MED ORDER — METRONIDAZOLE 500 MG PO TABS: 500.0000 mg | ORAL_TABLET | Freq: Three times a day (TID) | ORAL | 0 refills | Status: DC

## 2024-04-06 MED ORDER — CIPROFLOXACIN HCL 500 MG PO TABS: 500.0000 mg | ORAL_TABLET | Freq: Two times a day (BID) | ORAL | 0 refills | Status: AC

## 2024-04-06 NOTE — Progress Notes (Signed)
   Subjective:    Patient ID: Troy Howard, male    DOB: 03/16/1941, 83 y.o.   MRN: [id number]  HPI Here for 2 weeks of intermittent mild to moderate lower abdominal pains. These mostly involve the central and left lower abdominal areas. No fever or nausea. His appetite is somewhat decreased. He has a BM daily as usual, no blood or black stools have been seen. No urinary symptoms. He was here on 02-08-23 for urinary urgency and burning, and he had foul smelling urine. A culture grew pan-sensitive E coli. He was treated with Bactrim  DS, and he recovered quickly. His current symptoms are very different from then.    Review of Systems  Constitutional: Negative.   Respiratory: Negative.    Cardiovascular: Negative.   Gastrointestinal:  Positive for abdominal pain. Negative for abdominal distention, blood in stool, constipation, diarrhea, nausea, rectal pain and vomiting.  Genitourinary: Negative.        Objective:   Physical Exam Constitutional:      Appearance: Normal appearance. He is not ill-appearing.  Cardiovascular:     Rate and Rhythm: Normal rate and regular rhythm.     Pulses: Normal pulses.     Heart sounds: Normal heart sounds.  Pulmonary:     Effort: Pulmonary effort is normal.     Breath sounds: Normal breath sounds.  Abdominal:     General: Abdomen is flat. Bowel sounds are normal. There is no distension.     Palpations: Abdomen is soft. There is no mass.     Tenderness: There is no right CVA tenderness, left CVA tenderness, guarding or rebound.     Hernia: No hernia is present.     Comments: He is tender in the suprapubic area, the LLQ, and the left flank   Neurological:     Mental Status: He is alert.           Assessment & Plan:  Diverticulitis. Treat with 10 days of Cipro and Metronidazole. Recheck as needed.  Corita Diego, MD

## 2024-04-11 ENCOUNTER — Other Ambulatory Visit: Payer: Self-pay

## 2024-04-11 DIAGNOSIS — D472 Monoclonal gammopathy: Secondary | ICD-10-CM

## 2024-04-11 DIAGNOSIS — C9000 Multiple myeloma not having achieved remission: Secondary | ICD-10-CM

## 2024-04-12 ENCOUNTER — Inpatient Hospital Stay: Attending: Hematology

## 2024-04-12 DIAGNOSIS — Z7984 Long term (current) use of oral hypoglycemic drugs: Secondary | ICD-10-CM | POA: Insufficient documentation

## 2024-04-12 DIAGNOSIS — I129 Hypertensive chronic kidney disease with stage 1 through stage 4 chronic kidney disease, or unspecified chronic kidney disease: Secondary | ICD-10-CM | POA: Insufficient documentation

## 2024-04-12 DIAGNOSIS — N183 Chronic kidney disease, stage 3 unspecified: Secondary | ICD-10-CM | POA: Insufficient documentation

## 2024-04-12 DIAGNOSIS — E1122 Type 2 diabetes mellitus with diabetic chronic kidney disease: Secondary | ICD-10-CM | POA: Diagnosis not present

## 2024-04-12 DIAGNOSIS — Z79899 Other long term (current) drug therapy: Secondary | ICD-10-CM | POA: Insufficient documentation

## 2024-04-12 DIAGNOSIS — C9000 Multiple myeloma not having achieved remission: Secondary | ICD-10-CM | POA: Insufficient documentation

## 2024-04-12 DIAGNOSIS — Z87891 Personal history of nicotine dependence: Secondary | ICD-10-CM | POA: Insufficient documentation

## 2024-04-12 DIAGNOSIS — D472 Monoclonal gammopathy: Secondary | ICD-10-CM

## 2024-04-12 DIAGNOSIS — E538 Deficiency of other specified B group vitamins: Secondary | ICD-10-CM | POA: Insufficient documentation

## 2024-04-12 LAB — CBC WITH DIFFERENTIAL (CANCER CENTER ONLY)
Abs Immature Granulocytes: 0.01 10*3/uL (ref 0.00–0.07)
Basophils Absolute: 0 10*3/uL (ref 0.0–0.1)
Basophils Relative: 1 %
Eosinophils Absolute: 0.1 10*3/uL (ref 0.0–0.5)
Eosinophils Relative: 2 %
HCT: 34 % — ABNORMAL LOW (ref 39.0–52.0)
Hemoglobin: 11.3 g/dL — ABNORMAL LOW (ref 13.0–17.0)
Immature Granulocytes: 0 %
Lymphocytes Relative: 53 %
Lymphs Abs: 2.3 10*3/uL (ref 0.7–4.0)
MCH: 30.9 pg (ref 26.0–34.0)
MCHC: 33.2 g/dL (ref 30.0–36.0)
MCV: 92.9 fL (ref 80.0–100.0)
Monocytes Absolute: 0.6 10*3/uL (ref 0.1–1.0)
Monocytes Relative: 14 %
Neutro Abs: 1.3 10*3/uL — ABNORMAL LOW (ref 1.7–7.7)
Neutrophils Relative %: 30 %
Platelet Count: 211 10*3/uL (ref 150–400)
RBC: 3.66 MIL/uL — ABNORMAL LOW (ref 4.22–5.81)
RDW: 14.3 % (ref 11.5–15.5)
WBC Count: 4.4 10*3/uL (ref 4.0–10.5)
nRBC: 0 % (ref 0.0–0.2)

## 2024-04-12 LAB — CMP (CANCER CENTER ONLY)
ALT: 9 U/L (ref 0–44)
AST: 11 U/L — ABNORMAL LOW (ref 15–41)
Albumin: 3.5 g/dL (ref 3.5–5.0)
Alkaline Phosphatase: 56 U/L (ref 38–126)
Anion gap: 5 (ref 5–15)
BUN: 30 mg/dL — ABNORMAL HIGH (ref 8–23)
CO2: 22 mmol/L (ref 22–32)
Calcium: 8.6 mg/dL — ABNORMAL LOW (ref 8.9–10.3)
Chloride: 108 mmol/L (ref 98–111)
Creatinine: 2 mg/dL — ABNORMAL HIGH (ref 0.61–1.24)
GFR, Estimated: 33 mL/min — ABNORMAL LOW (ref >60)
Glucose, Bld: 77 mg/dL (ref 70–99)
Potassium: 4.5 mmol/L (ref 3.5–5.1)
Sodium: 135 mmol/L (ref 135–145)
Total Bilirubin: 0.3 mg/dL (ref 0.0–1.2)
Total Protein: 9.2 g/dL — ABNORMAL HIGH (ref 6.5–8.1)

## 2024-04-13 LAB — KAPPA/LAMBDA LIGHT CHAINS
Kappa free light chain: 14.2 mg/L (ref 3.3–19.4)
Kappa, lambda light chain ratio: 0.12 — ABNORMAL LOW (ref 0.26–1.65)
Lambda free light chains: 114.1 mg/L — ABNORMAL HIGH (ref 5.7–26.3)

## 2024-04-17 LAB — MULTIPLE MYELOMA PANEL, SERUM
Albumin SerPl Elph-Mcnc: 3.4 g/dL (ref 2.9–4.4)
Albumin/Glob SerPl: 0.8 (ref 0.7–1.7)
Alpha 1: 0.3 g/dL (ref 0.0–0.4)
Alpha2 Glob SerPl Elph-Mcnc: 0.9 g/dL (ref 0.4–1.0)
B-Globulin SerPl Elph-Mcnc: 0.7 g/dL (ref 0.7–1.3)
Gamma Glob SerPl Elph-Mcnc: 2.9 g/dL — ABNORMAL HIGH (ref 0.4–1.8)
Globulin, Total: 4.8 g/dL — ABNORMAL HIGH (ref 2.2–3.9)
IgA: 36 mg/dL — ABNORMAL LOW (ref 61–437)
IgG (Immunoglobin G), Serum: 4133 mg/dL — ABNORMAL HIGH (ref 603–1613)
IgM (Immunoglobulin M), Srm: 16 mg/dL (ref 15–143)
M Protein SerPl Elph-Mcnc: 2.6 g/dL — ABNORMAL HIGH
Total Protein ELP: 8.2 g/dL (ref 6.0–8.5)

## 2024-04-22 ENCOUNTER — Other Ambulatory Visit: Payer: Self-pay | Admitting: Family Medicine

## 2024-04-25 NOTE — Progress Notes (Signed)
 HEMATOLOGY/ONCOLOGY PHONE VISIT NOTE  Date of Service: 04/26/2024   Patient Care Team: Donley Furth, MD as PCP - General (Family Medicine) Zilphia Hilt, Daria Eddy, [facility] (Pharmacist)  CHIEF COMPLAINTS/PURPOSE OF CONSULTATION:  Follow-up for continued evaluation and management of smoldering myeloma  HISTORY OF PRESENTING ILLNESS:   Please see previous note for details on initial presentation  Interval History:   Troy Howard is a 83 y.o. male who is here for continued evaluation and management of IgG Lambda smoldering myeloma.   Patient was last seen by me on 02/16/2024 and reported stable mild chronic fatigue and urinary tract infection with E coli.   I connected with Troy Howard on 04/26/2024 at  8:40 AM EDT by telephone visit and verified that I am speaking with the correct person using two identifiers.   I discussed the limitations, risks, security and privacy concerns of performing an evaluation and management service by telemedicine and the availability of in-person appointments. I also discussed with the patient that there may be a patient responsible charge related to this service. The patient expressed understanding and agreed to proceed.   Other persons participating in the visit and their role in the encounter: none   Patient's location: home  Provider's location: [facility]   Chief Complaint: IgG Lambda smoldering myeloma     Patient reports an episode of acute diverticulitis which resolved with antibiotics after a couple of weeks.   He has no other acute new symptoms. Patient has no new bone pains, fever, chills, night sweats, new fatigue,   The results of his recent lab workup was discussed with him in detail.  MEDICAL HISTORY:  Past Medical History:  Diagnosis Date   Arthritis    back   Cancer [facility])    prostate, sees Dr. Florencio Hunting    Chronic kidney disease 2017   stage 3 kidney disease   Diabetes mellitus    Erectile dysfunction    GERD  (gastroesophageal reflux disease)    Hyperlipidemia    Hypertension    Migraines    Peptic ulcer    Thyroid  disease     SURGICAL HISTORY: Past Surgical History:  Procedure Laterality Date   APPENDECTOMY  1950   COLONOSCOPY  05/22/2021   per Dr. Sandrea Cruel, benign polyps, no repeats needed   HERNIA REPAIR     umbilical    MENISCUS REPAIR Left    PROSTATE SURGERY  2009   radical prostatectomy   SPINE SURGERY     L4-L5 fusion per Dr. Tawana Fast    TONSILLECTOMY AND ADENOIDECTOMY  1954    SOCIAL HISTORY: Social History   Socioeconomic History   Marital status: Married    Spouse name: Not on file   Number of children: Not on file   Years of education: Not on file   Highest education level: Some college, no degree  Occupational History   Not on file  Tobacco Use   Smoking status: Former    Current packs/day: 0.00    Average packs/day: 0.3 packs/day for 5.0 years (1.3 ttl pk-yrs)    Types: Cigarettes    Start date: 85    Quit date: 20    Years since quitting: 58.4   Smokeless tobacco: Never  Vaping Use   Vaping status: Never Used  Substance and Sexual Activity   Alcohol use: Not Currently    Comment: rare one glass every 6 months   Drug use: No   Sexual activity: Yes  Other Topics  Concern   Not on file  Social History Narrative   Not on file   Social Drivers of Health   Financial Resource Strain: Low Risk  (07/14/2022)   Overall Financial Resource Strain (CARDIA)    Difficulty of Paying Living Expenses: Not hard at all  Food Insecurity: No Food Insecurity (03/15/2023)   Hunger Vital Sign    Worried About Running Out of Food in the Last Year: Never true    Ran Out of Food in the Last Year: Never true  Transportation Needs: No Transportation Needs (03/15/2023)   PRAPARE - Administrator, Civil Service (Medical): No    Lack of Transportation (Non-Medical): No  Physical Activity: Insufficiently Active (03/15/2023)   Exercise Vital Sign    Days of  Exercise per Week: 3 days    Minutes of Exercise per Session: 20 min  Stress: Stress Concern Present (03/15/2023)   [employer] of Occupational Health - Occupational Stress Questionnaire    Feeling of Stress : To some extent  Social Connections: Moderately Isolated (03/15/2023)   Social Connection and Isolation Panel [NHANES]    Frequency of Communication with Friends and Family: Three times a week    Frequency of Social Gatherings with Friends and Family: Once a week    Attends Religious Services: Never    Database administrator or Organizations: No    Attends Engineer, structural: Not on file    Marital Status: Married  Catering manager Violence: Not At Risk (01/15/2022)   Humiliation, Afraid, Rape, and Kick questionnaire    Fear of Current or Ex-Partner: No    Emotionally Abused: No    Physically Abused: No    Sexually Abused: No    FAMILY HISTORY: Family History  Problem Relation Age of Onset   Hypertension Other    Cancer Other    Colon cancer Neg Hx    Colon polyps Neg Hx    Esophageal cancer Neg Hx    Rectal cancer Neg Hx    Stomach cancer Neg Hx     ALLERGIES:  has no known allergies.  MEDICATIONS:  Current Outpatient Medications  Medication Sig Dispense Refill   aspirin-acetaminophen-caffeine  (EXCEDRIN MIGRAINE) 250-250-65 MG tablet Take 1 tablet by mouth every 8 (eight) hours as needed for migraine.     atorvastatin  (LIPITOR) 10 MG tablet Take 1 tablet (10 mg total) by mouth daily. 90 tablet 3   Blood Glucose Monitoring Suppl (ONE TOUCH ULTRA 2) w/Device KIT Use to check blood sugars once daily. 1 kit 0   canagliflozin  (INVOKANA ) 300 MG TABS tablet Take 1 tablet (300 mg total) by mouth daily before breakfast. 90 tablet 3   CHELATED ZINC PO Take 1 capsule by mouth daily.     Cholecalciferol (VITAMIN D3) 2000 units capsule Take 2,000 Units by mouth daily.      Cyanocobalamin  (VITAMIN B 12 PO) Take 2,500 mcg by mouth daily.      esomeprazole  (NEXIUM ) 40  MG capsule Take 1 capsule (40 mg total) by mouth daily. 90 capsule 3   FOLIC ACID PO Take 800 mcg by mouth daily.      glipiZIDE  (GLUCOTROL ) 5 MG tablet Take 1 tablet (5 mg total) by mouth 2 (two) times daily before a meal. 180 tablet 3   Levomefolate Glucosamine (METHYLFOLATE PO) Take 1 tablet by mouth daily.     metroNIDAZOLE  (FLAGYL ) 500 MG tablet Take 1 tablet (500 mg total) by mouth 3 (three) times daily. 30 tablet 0  olmesartan  (BENICAR ) 40 MG tablet Take 1 tablet (40 mg total) by mouth daily. 90 tablet 3   Omega-3 Fatty Acids (FISH OIL) 1000 MG CAPS Take by mouth daily.      OneTouch Delica Lancets 30G MISC USE ONCE A DAY WHEN CHECKING BLOOD SUGAR 100 each 3   ONETOUCH VERIO test strip USE ONCE A DAY TO TEST BLOOD SUGAR 100 strip 1   pyridOXINE (VITAMIN B-6) 100 MG tablet Take 100 mg by mouth daily.     SUMAtriptan  (IMITREX ) 100 MG tablet TAKE 1 TABLET (100 MG TOTAL) BY MOUTH AS NEEDED FOR MIGRAINE. MAY REPEAT IN 2 HOURS IF HEADACHE PERSISTS OR RECURS. 9 tablet 6   SYNTHROID  137 MCG tablet Take 1 tablet (137 mcg total) by mouth daily before breakfast. 90 tablet 3   temazepam  (RESTORIL ) 30 MG capsule TAKE 1 CAPSULE BY MOUTH AT BEDTIME AS NEEDED FOR SLEEP 30 capsule 5   traZODone  (DESYREL ) 100 MG tablet Take 1 tablet (100 mg total) by mouth at bedtime. 90 tablet 3   No current facility-administered medications for this visit.    REVIEW OF SYSTEMS:    10 Point review of Systems was done is negative except as noted above.   PHYSICAL EXAMINATION: TELEMEDICINE VISIT   LABORATORY DATA:  I have reviewed the data as listed  .    Latest Ref Rng & Units 04/12/2024    7:52 AM 02/09/2024    8:07 AM 11/02/2023    8:58 AM  CBC  WBC 4.0 - 10.5 K/uL 4.4  3.3  4.1   Hemoglobin 13.0 - 17.0 g/dL 40.9  81.1  91.4   Hematocrit 39.0 - 52.0 % 34.0  33.7  35.5   Platelets 150 - 400 K/uL 211  225  244.0    . CBC    Component Value Date/Time   WBC 4.4 04/12/2024 0752   WBC 4.1 11/02/2023 0858    RBC 3.66 (L) 04/12/2024 0752   HGB 11.3 (L) 04/12/2024 0752   HCT 34.0 (L) 04/12/2024 0752   PLT 211 04/12/2024 0752   MCV 92.9 04/12/2024 0752   MCH 30.9 04/12/2024 0752   MCHC 33.2 04/12/2024 0752   RDW 14.3 04/12/2024 0752   LYMPHSABS 2.3 04/12/2024 0752   MONOABS 0.6 04/12/2024 0752   EOSABS 0.1 04/12/2024 0752   BASOSABS 0.0 04/12/2024 0752    .    Latest Ref Rng & Units 04/12/2024    7:52 AM 02/09/2024    8:07 AM 11/02/2023    8:58 AM  CMP  Glucose 70 - 99 mg/dL 77  90  76   BUN 8 - 23 mg/dL 30  37  37   Creatinine 0.61 - 1.24 mg/dL 7.82  9.56  2.13   Sodium 135 - 145 mmol/L 135  135  132   Potassium 3.5 - 5.1 mmol/L 4.5  4.3  4.7   Chloride 98 - 111 mmol/L 108  110  105   CO2 22 - 32 mmol/L 22  22  25    Calcium  8.9 - 10.3 mg/dL 8.6  8.5  8.9   Total Protein 6.5 - 8.1 g/dL 9.2  9.0  9.1   Total Bilirubin 0.0 - 1.2 mg/dL 0.3  0.4  0.7   Alkaline Phos 38 - 126 U/L 56  75  70   AST 15 - 41 U/L 11  8  8    ALT 0 - 44 U/L 9  7  10      03/27/2021 BM Bx  DIAGNOSIS:   BONE MARROW, ASPIRATE, CLOT, CORE:  -  Hypercellular bone marrow with involvement by plasma cell neoplasm  -  See comment    COMMENT:   Plasma cells are increased on aspirate smears (8% by manual differential  count) and by CD138 immunohistochemistry on the core biopsy and clot  section (15%).  Plasma cells express lambda-restricted light chains by  in situ hybridization.  Together the findings are consistent with bone  marrow involvement by a lambda-restricted plasma cell neoplasm.  Correlation with clinical impressions, radiographic studies, other  laboratory data, and cytogenetic/FISH results is recommended.   03/27/2021 Molecular Pathology    03/27/2021 Cytogenetics    RADIOGRAPHIC STUDIES: I have personally reviewed the radiological images as listed and agreed with the findings in the report. No results found.   ASSESSMENT & PLAN:   Josmar G Frei is a 83 y.o. caucasian male  with    1. IgG Lambda smoldering myeloma He has a M-spike of 0.8g/dl and normal SFLC and ratio. No anemia - nl Hgb No focal bone pains --- Skeletal survey - shows no bone mets No hypercalcemia. Creatinine elevated - likely CKD from long standing HTN and DM2  05/10/18 Metastatic Bone Survey revealed no lytic lesions on skeletal survey to suggest multiple myeloma.   2. 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 Christianne Cowper  -mild non nephrotic proteinuria on 24h UPEP-Continue follow up with Dr Christianne Cowper for CKD    3. Vitamin B12 deficient  Antibody testing done - no evidence of pernicious anemia. No evidence of pernicious anemia based on neg IF and parietal cell ab -continue B12 replacement per PCP  4.  Past Medical History:  Diagnosis Date   Arthritis    back   Cancer [facility])    prostate, sees Dr. Florencio Hunting    Chronic kidney disease 2017   stage 3 kidney disease   Diabetes mellitus    Erectile dysfunction    GERD (gastroesophageal reflux disease)    Hyperlipidemia    Hypertension    Migraines    Peptic ulcer    Thyroid  disease    PLAN:   -Discussed lab results from 04/12/2024 in detail with patient. CBC showed WBC of 4.4K, hemoglobin of 11.3, and platelets of 211K. -His labs show stable blood counts with some stable mild anemia with hgb 11.3 likely related to CKD -He also has mild neutropenia with neutrophils at 1.3. We discussed neutropenic precautions.  -CMP shows stable creatinine at 2 . There is no Hypercalcemia.  -myeloma lab from two weeks ago shows stable M protein of 2.6 g/dL -stable serum free light chains -patient has no overt evidence of progression to active myeloma at this time -Patient is agreeable to monitor this closely.  -will plan for phone visit in 4 months with labs a couple weeks prior to the visit  FOLLOW UP: Phone visit with Dr Salomon Cree in 4 months Labs 2 weeks prior to phone visit  The total time spent in the appointment was 20  minutes* .  All of the patient's questions were answered with apparent satisfaction. The patient knows to call the clinic with any problems, questions or concerns.   Jacquelyn Matt MD MS AAHIVMS [facility] Hematology/Oncology Physician [facility]  .*Total Encounter Time as defined by the Centers for Medicare and Medicaid Services includes, in addition to the face-to-face time of a patient visit (documented in the note above) non-face-to-face time: obtaining and reviewing outside history,  ordering and reviewing medications, tests or procedures, care coordination (communications with other health care professionals or caregivers) and documentation in the medical record.    I,Mitra Faeizi,acting as a Neurosurgeon for Jacquelyn Matt, MD.,have documented all relevant documentation on the behalf of Jacquelyn Matt, MD,as directed by  Jacquelyn Matt, MD while in the presence of Jacquelyn Matt, MD.  .I have reviewed the above documentation for accuracy and completeness, and I agree with the above. .Mayo Owczarzak Kishore Delonda Coley MD

## 2024-04-26 ENCOUNTER — Inpatient Hospital Stay (HOSPITAL_BASED_OUTPATIENT_CLINIC_OR_DEPARTMENT_OTHER): Admitting: Hematology

## 2024-04-26 DIAGNOSIS — C9000 Multiple myeloma not having achieved remission: Secondary | ICD-10-CM | POA: Diagnosis not present

## 2024-04-26 DIAGNOSIS — D472 Monoclonal gammopathy: Secondary | ICD-10-CM

## 2024-05-12 ENCOUNTER — Telehealth: Payer: Self-pay

## 2024-05-12 NOTE — Telephone Encounter (Signed)
 Copied from CRM [id number]. Topic: Clinical - Medication Question >> May 12, 2024  3:25 PM Chuck Crater wrote: Reason for CRM: Patient is a diabetic and has questions. He is on the Onetouch and wants to know if he can go on the Dexcom.

## 2024-05-15 ENCOUNTER — Telehealth: Payer: Self-pay

## 2024-05-15 NOTE — Telephone Encounter (Signed)
 Spoke with pt advised to contact his insurance to check on coverage. Pt will call the office back

## 2024-05-15 NOTE — Telephone Encounter (Signed)
 Copied from CRM [id number]. Topic: Clinical - Medication Question >> May 15, 2024  1:27 PM Shereese L wrote: Reason for CRM: patient is requesting a call back in reference to the glucose monitor libra that's accepted by his insurance and would like a script put in for the libra

## 2024-05-16 NOTE — Telephone Encounter (Signed)
 Okay to place Rx?

## 2024-05-16 NOTE — Telephone Encounter (Signed)
 Yes please send in this RX

## 2024-05-17 MED ORDER — FREESTYLE LIBRE 3 SENSOR MISC: 5 refills | Status: DC

## 2024-05-17 NOTE — Addendum Note (Signed)
 Addended by: Nicolina Barrios B on: 05/17/2024 09:19 AM   Modules accepted: Orders

## 2024-05-17 NOTE — Telephone Encounter (Signed)
 Rx sent.

## 2024-08-09 DIAGNOSIS — E119 Type 2 diabetes mellitus without complications: Secondary | ICD-10-CM | POA: Diagnosis not present

## 2024-08-09 DIAGNOSIS — H53143 Visual discomfort, bilateral: Secondary | ICD-10-CM | POA: Diagnosis not present

## 2024-08-14 ENCOUNTER — Other Ambulatory Visit: Payer: Self-pay

## 2024-08-14 DIAGNOSIS — C9000 Multiple myeloma not having achieved remission: Secondary | ICD-10-CM

## 2024-08-14 DIAGNOSIS — D472 Monoclonal gammopathy: Secondary | ICD-10-CM

## 2024-08-15 ENCOUNTER — Inpatient Hospital Stay: Attending: Hematology

## 2024-08-15 DIAGNOSIS — N183 Chronic kidney disease, stage 3 unspecified: Secondary | ICD-10-CM | POA: Insufficient documentation

## 2024-08-15 DIAGNOSIS — Z7984 Long term (current) use of oral hypoglycemic drugs: Secondary | ICD-10-CM | POA: Diagnosis not present

## 2024-08-15 DIAGNOSIS — E1122 Type 2 diabetes mellitus with diabetic chronic kidney disease: Secondary | ICD-10-CM | POA: Diagnosis not present

## 2024-08-15 DIAGNOSIS — C9000 Multiple myeloma not having achieved remission: Secondary | ICD-10-CM | POA: Insufficient documentation

## 2024-08-15 DIAGNOSIS — I129 Hypertensive chronic kidney disease with stage 1 through stage 4 chronic kidney disease, or unspecified chronic kidney disease: Secondary | ICD-10-CM | POA: Diagnosis not present

## 2024-08-15 DIAGNOSIS — Z79899 Other long term (current) drug therapy: Secondary | ICD-10-CM | POA: Diagnosis not present

## 2024-08-15 DIAGNOSIS — Z87891 Personal history of nicotine dependence: Secondary | ICD-10-CM | POA: Insufficient documentation

## 2024-08-15 DIAGNOSIS — D472 Monoclonal gammopathy: Secondary | ICD-10-CM

## 2024-08-15 DIAGNOSIS — E538 Deficiency of other specified B group vitamins: Secondary | ICD-10-CM | POA: Insufficient documentation

## 2024-08-15 LAB — CBC WITH DIFFERENTIAL (CANCER CENTER ONLY)
Abs Immature Granulocytes: 0.01 K/uL (ref 0.00–0.07)
Basophils Absolute: 0 K/uL (ref 0.0–0.1)
Basophils Relative: 0 %
Eosinophils Absolute: 0.1 K/uL (ref 0.0–0.5)
Eosinophils Relative: 1 %
HCT: 36.5 % — ABNORMAL LOW (ref 39.0–52.0)
Hemoglobin: 12.1 g/dL — ABNORMAL LOW (ref 13.0–17.0)
Immature Granulocytes: 0 %
Lymphocytes Relative: 51 %
Lymphs Abs: 2.5 K/uL (ref 0.7–4.0)
MCH: 30.9 pg (ref 26.0–34.0)
MCHC: 33.2 g/dL (ref 30.0–36.0)
MCV: 93.4 fL (ref 80.0–100.0)
Monocytes Absolute: 0.6 K/uL (ref 0.1–1.0)
Monocytes Relative: 11 %
Neutro Abs: 1.8 K/uL (ref 1.7–7.7)
Neutrophils Relative %: 37 %
Platelet Count: 202 K/uL (ref 150–400)
RBC: 3.91 MIL/uL — ABNORMAL LOW (ref 4.22–5.81)
RDW: 14.1 % (ref 11.5–15.5)
WBC Count: 5 K/uL (ref 4.0–10.5)
nRBC: 0 % (ref 0.0–0.2)

## 2024-08-15 LAB — CMP (CANCER CENTER ONLY)
ALT: 11 U/L (ref 0–44)
AST: 11 U/L — ABNORMAL LOW (ref 15–41)
Albumin: 3.8 g/dL (ref 3.5–5.0)
Alkaline Phosphatase: 79 U/L (ref 38–126)
Anion gap: 5 (ref 5–15)
BUN: 43 mg/dL — ABNORMAL HIGH (ref 8–23)
CO2: 23 mmol/L (ref 22–32)
Calcium: 9.1 mg/dL (ref 8.9–10.3)
Chloride: 107 mmol/L (ref 98–111)
Creatinine: 2.24 mg/dL — ABNORMAL HIGH (ref 0.61–1.24)
GFR, Estimated: 29 mL/min — ABNORMAL LOW (ref >60)
Glucose, Bld: 82 mg/dL (ref 70–99)
Potassium: 4.6 mmol/L (ref 3.5–5.1)
Sodium: 135 mmol/L (ref 135–145)
Total Bilirubin: 0.6 mg/dL (ref 0.0–1.2)
Total Protein: 9.5 g/dL — ABNORMAL HIGH (ref 6.5–8.1)

## 2024-08-16 LAB — KAPPA/LAMBDA LIGHT CHAINS
Kappa free light chain: 19.5 mg/L — ABNORMAL HIGH (ref 3.3–19.4)
Kappa, lambda light chain ratio: 0.15 — ABNORMAL LOW (ref 0.26–1.65)
Lambda free light chains: 130.1 mg/L — ABNORMAL HIGH (ref 5.7–26.3)

## 2024-08-19 LAB — MULTIPLE MYELOMA PANEL, SERUM
Albumin SerPl Elph-Mcnc: 3.6 g/dL (ref 2.9–4.4)
Albumin/Glob SerPl: 0.7 (ref 0.7–1.7)
Alpha 1: 0.3 g/dL (ref 0.0–0.4)
Alpha2 Glob SerPl Elph-Mcnc: 1.1 g/dL — ABNORMAL HIGH (ref 0.4–1.0)
B-Globulin SerPl Elph-Mcnc: 0.9 g/dL (ref 0.7–1.3)
Gamma Glob SerPl Elph-Mcnc: 3.2 g/dL — ABNORMAL HIGH (ref 0.4–1.8)
Globulin, Total: 5.5 g/dL — ABNORMAL HIGH (ref 2.2–3.9)
IgA: 33 mg/dL — ABNORMAL LOW (ref 61–437)
IgG (Immunoglobin G), Serum: 4162 mg/dL — ABNORMAL HIGH (ref 603–1613)
IgM (Immunoglobulin M), Srm: 16 mg/dL (ref 15–143)
M Protein SerPl Elph-Mcnc: 3 g/dL — ABNORMAL HIGH
Total Protein ELP: 9.1 g/dL — ABNORMAL HIGH (ref 6.0–8.5)

## 2024-08-24 ENCOUNTER — Other Ambulatory Visit: Payer: Self-pay | Admitting: Family Medicine

## 2024-08-29 ENCOUNTER — Inpatient Hospital Stay (HOSPITAL_BASED_OUTPATIENT_CLINIC_OR_DEPARTMENT_OTHER): Admitting: Hematology

## 2024-08-29 DIAGNOSIS — C9000 Multiple myeloma not having achieved remission: Secondary | ICD-10-CM | POA: Diagnosis not present

## 2024-08-29 DIAGNOSIS — D472 Monoclonal gammopathy: Secondary | ICD-10-CM

## 2024-08-29 NOTE — Progress Notes (Signed)
 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    INTERVAL HISTORY:  Mr. 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 [email or web address] connected with Kurtis G Mcnellis on 08/29/2024 at  8:40 AM EDT by telephone visit and verified that I am speaking with the correct person using two identifiers.   I discussed the limitations, risks, security and privacy concerns of performing an evaluation and management service by telemedicine and the availability of in-person appointments. I also discussed with the patient that there may be a patient responsible charge related to this service. The patient expressed understanding and agreed to proceed.   Other persons participating in the visit and their role in the encounter: patients wife   Patient's location: Home  Provider's location: [facility]   Chief Complaint: f/u for High risk Smoldering Myeloma.  Patient notes no new acute symptoms since his last clinic visit.  He notes persistent possibly somewhat worsened fatigue since his last visit.  No acute new focal symptoms.  No new bone pains. No fevers no chills no night sweats no unexpected weight loss.SABRA Has been drinking enough water. No new medications.. Recent labs were discussed in detail with him.SABRA   REVIEW OF SYSTEMS:   10 Point review of Systems was done is negative except as noted above.  MEDICAL HISTORY Past Medical History:  Diagnosis Date   Arthritis    back   Cancer [facility])    prostate, sees Dr. Gretel Ferrara    Chronic kidney  disease 2017   stage 3 kidney disease   Diabetes mellitus    Erectile dysfunction    GERD (gastroesophageal reflux disease)    Hyperlipidemia    Hypertension    Migraines    Peptic ulcer    Thyroid  disease     SURGICAL HISTORY Past Surgical History:  Procedure Laterality Date   APPENDECTOMY  1950   COLONOSCOPY  05/22/2021   per Dr. Aneita, benign polyps, no repeats needed   HERNIA REPAIR     umbilical    MENISCUS REPAIR Left    PROSTATE SURGERY  2009   radical prostatectomy   SPINE SURGERY     L4-L5 fusion per Dr. Oneil Carwin    TONSILLECTOMY AND ADENOIDECTOMY  1954    SOCIAL HISTORY Social History   Tobacco Use   Smoking status: Former    Current packs/day: 0.00    Average packs/day: 0.3 packs/day for 5.0 years (1.3 ttl pk-yrs)    Types: Cigarettes    Start date: 51    Quit date: 53    Years since quitting: 58.7   Smokeless tobacco: Never  Vaping Use   Vaping status: Never Used  Substance Use Topics   Alcohol use: Not Currently    Comment: rare one glass every 6 months   Drug use: No    Social History   Social History Narrative   Not on file    SOCIAL DRIVERS OF HEALTH SDOH Screenings   Food Insecurity: No Food Insecurity (03/15/2023)  Housing: Low Risk  (03/15/2023)  Transportation Needs: No Transportation Needs (03/15/2023)  Alcohol Screen:  Low Risk  (01/15/2022)  Depression (PHQ2-9): Low Risk  (01/25/2024)  Financial Resource Strain: Low Risk  (07/14/2022)  Physical Activity: Insufficiently Active (03/15/2023)  Social Connections: Moderately Isolated (03/15/2023)  Stress: Stress Concern Present (03/15/2023)  Tobacco Use: Medium Risk (04/06/2024)     FAMILY HISTORY Family History  Problem Relation Age of Onset   Hypertension Other    Cancer Other    Colon cancer Neg Hx    Colon polyps Neg Hx    Esophageal cancer Neg Hx    Rectal cancer Neg Hx    Stomach cancer Neg Hx      ALLERGIES: has no known allergies.  MEDICATIONS  Current Outpatient  Medications  Medication Sig Dispense Refill   aspirin-acetaminophen-caffeine  (EXCEDRIN MIGRAINE) 250-250-65 MG tablet Take 1 tablet by mouth every 8 (eight) hours as needed for migraine.     atorvastatin  (LIPITOR) 10 MG tablet Take 1 tablet (10 mg total) by mouth daily. 90 tablet 3   Blood Glucose Monitoring Suppl (ONE TOUCH ULTRA 2) w/Device KIT Use to check blood sugars once daily. 1 kit 0   canagliflozin  (INVOKANA ) 300 MG TABS tablet Take 1 tablet (300 mg total) by mouth daily before breakfast. 90 tablet 3   CHELATED ZINC PO Take 1 capsule by mouth daily.     Cholecalciferol (VITAMIN D3) 2000 units capsule Take 2,000 Units by mouth daily.      Continuous Glucose Sensor (FREESTYLE LIBRE 3 SENSOR) MISC Place 1 sensor on the skin every 14 days. Use to check glucose continuously 2 each 5   Cyanocobalamin  (VITAMIN B 12 PO) Take 2,500 mcg by mouth daily.      esomeprazole  (NEXIUM ) 40 MG capsule Take 1 capsule (40 mg total) by mouth daily. 90 capsule 3   FOLIC ACID PO Take 800 mcg by mouth daily.      glipiZIDE  (GLUCOTROL ) 5 MG tablet Take 1 tablet (5 mg total) by mouth 2 (two) times daily before a meal. 180 tablet 3   Levomefolate Glucosamine (METHYLFOLATE PO) Take 1 tablet by mouth daily.     metroNIDAZOLE  (FLAGYL ) 500 MG tablet Take 1 tablet (500 mg total) by mouth 3 (three) times daily. 30 tablet 0   olmesartan  (BENICAR ) 40 MG tablet Take 1 tablet (40 mg total) by mouth daily. 90 tablet 3   Omega-3 Fatty Acids (FISH OIL) 1000 MG CAPS Take by mouth daily.      OneTouch Delica Lancets 30G MISC USE ONCE A DAY WHEN CHECKING BLOOD SUGAR 100 each 3   ONETOUCH VERIO test strip USE ONCE A DAY TO TEST BLOOD SUGAR 100 strip 1   pyridOXINE (VITAMIN B-6) 100 MG tablet Take 100 mg by mouth daily.     SUMAtriptan  (IMITREX ) 100 MG tablet TAKE 1 TABLET (100 MG TOTAL) BY MOUTH AS NEEDED FOR MIGRAINE. MAY REPEAT IN 2 HOURS IF HEADACHE PERSISTS OR RECURS. 9 tablet 6   SYNTHROID  137 MCG tablet TAKE 1 TABLET BY  MOUTH DAILY BEFORE BREAKFAST. 90 tablet 3   temazepam  (RESTORIL ) 30 MG capsule TAKE 1 CAPSULE BY MOUTH AT BEDTIME AS NEEDED FOR SLEEP 30 capsule 5   traZODone  (DESYREL ) 100 MG tablet Take 1 tablet (100 mg total) by mouth at bedtime. 90 tablet 3   No current facility-administered medications for this visit.    PHYSICAL EXAMINATION: Telemedicine visit  LABORATORY DATA:   I have reviewed the data as listed     Latest Ref Rng & Units 08/15/2024    9:00 AM 04/12/2024  7:52 AM 02/09/2024    8:07 AM  CBC EXTENDED  WBC 4.0 - 10.5 K/uL 5.0  4.4  3.3   RBC 4.22 - 5.81 MIL/uL 3.91  3.66  3.62   Hemoglobin 13.0 - 17.0 g/dL 87.8  88.6  88.7   HCT 39.0 - 52.0 % 36.5  34.0  33.7   Platelets 150 - 400 K/uL 202  211  225   NEUT# 1.7 - 7.7 K/uL 1.8  1.3  1.5   Lymph# 0.7 - 4.0 K/uL 2.5  2.3  1.3        Latest Ref Rng & Units 08/15/2024    9:00 AM 04/12/2024    7:52 AM 02/09/2024    8:09 AM  Immunoglobulin Electrophoresis  IgG 603 - 1,613 mg/dL 5,837  5,866  5,719   IgM 15 - 143 mg/dL 16  16  16         Latest Ref Rng & Units 08/15/2024    9:00 AM 04/12/2024    7:52 AM 02/09/2024    8:07 AM  CMP  Glucose 70 - 99 mg/dL 82  77  90   BUN 8 - 23 mg/dL 43  30  37   Creatinine 0.61 - 1.24 mg/dL 7.75  7.99  7.92   Sodium 135 - 145 mmol/L 135  135  135   Potassium 3.5 - 5.1 mmol/L 4.6  4.5  4.3   Chloride 98 - 111 mmol/L 107  108  110   CO2 22 - 32 mmol/L 23  22  22    Calcium  8.9 - 10.3 mg/dL 9.1  8.6  8.5   Total Protein 6.5 - 8.1 g/dL 9.5  9.2  9.0   Total Bilirubin 0.0 - 1.2 mg/dL 0.6  0.3  0.4   Alkaline Phos 38 - 126 U/L 79  56  75   AST 15 - 41 U/L 11  11  8    ALT 0 - 44 U/L 11  9  7      Multiple Myeloma Panel (SPEP&IFE w/QIG) Order: [id number]  Status: Edited Result - FINAL     Next appt: None     Dx: Smoldering myeloma; Multiple myeloma ...   Test Result Released: Yes (seen)   0 Result Notes          Component Ref Range & Units (hover) 3 wk ago (08/15/24) 4 mo  ago (04/12/24) 7 mo ago (02/09/24) 11 mo ago (10/11/23) 1 yr ago (07/22/23) 1 yr ago (03/24/23) 1 yr ago (12/08/22)  IgG (Immunoglobin G), Serum 4,162 High  4,133 High  4,280 High  4,185 High  4,177 High  4,027 High  3,674 High   IgA 33 Low  36 Low  CM 37 Low  CM 37 Low  CM 54 Low  40 Low  CM 45 Low  CM  Comment: Result confirmed on concentration.  IgM (Immunoglobulin M), Srm 16 16 CM 16 CM 17 CM 18 CM 19 CM 21 CM  Comment: Result confirmed on concentration.  Total Protein ELP 9.1 High  VC 8.2 VC 8.3 VC 8.9 High  VC 8.7 High  VC 8.3 VC 8.4 VC  Albumin SerPl Elph-Mcnc 3.6 VC 3.4 VC 3.3 VC 3.6 VC 3.5 VC 3.3 VC 3.4 VC  Alpha 1 0.3 VC 0.3 VC 0.3 VC 0.3 VC 0.3 VC 0.3 VC 0.3 VC  Alpha2 Glob SerPl Elph-Mcnc 1.1 High  VC 0.9 VC 1.0 VC 1.0 VC 1.0 VC 1.0 VC 1.0 VC  B-Globulin SerPl Elph-Mcnc 0.9 VC  0.7 VC 0.8 VC 0.9 VC 0.9 VC 0.8 VC 0.9 VC  Gamma Glob SerPl Elph-Mcnc 3.2 High  VC 2.9 High  VC 3.0 High  VC 3.2 High  VC 3.1 High  VC 2.8 High  VC 2.8 High  VC  M Protein SerPl Elph-Mcnc 3.0 High  VC 2.6 High  VC 2.8 High  VC 2.9 High  VC 2.8 High  VC 2.6 High  VC 2.7 High  VC  Globulin, Total 5.5 High  VC 4.8 High  VC 5.0 High  VC 5.3 High  VC 5.2 High  VC 5.0 High  VC 5.0 High  VC  Albumin/Glob SerPl 0.7 VC 0.8 VC 0.7 VC 0.7 VC 0.7 VC 0.7 VC 0.7 VC  IFE 1 Comment Abnormal  VC Comment Abnormal  VC, CM Comment Abnormal  VC, CM Comment Abnormal  VC, CM Comment Abnormal  VC, CM Comment Abnormal  VC, CM Comment Abnormal  VC, CM  Comment: (NOTE) Immunofixation shows IgG monoclonal protein with lambda light chain specificity.     Kappa/lambda light chains Order: [id number]  Status: Final result     Next appt: None     Dx: Smoldering myeloma; Multiple myeloma ...   Test Result Released: Yes (seen)   0 Result Notes          Component Ref Range & Units (hover) 3 wk ago (08/15/24) 4 mo ago (04/12/24) 7 mo ago (02/09/24) 11 mo ago (10/11/23) 1 yr ago (07/22/23) 1 yr ago (03/24/23) 1 yr ago (12/08/22)  Kappa  free light chain 19.5 High  14.2 15.5 13.8 14.7 14.4 17.7  Lambda free light chains 130.1 High  114.1 High  116.8 High  85.4 High  92.1 High  74.2 High  86.7 High   Kappa, lambda light chain ratio 0.15 Low  0.12 Low  CM 0.13 Low  CM 0.16 Low  CM 0.16 Low  CM 0.19 Low  CM 0.20 Low  CM       Surgical Pathology  CASE: [id number]  PATIENT: Zykee Borgmeyer  Bone Marrow Report      Clinical History: Smoldering Myleoma      DIAGNOSIS:   BONE MARROW, ASPIRATE, CLOT, CORE:  -Hypercellular bone marrow with plasma cell neoplasm  -See comment   PERIPHERAL BLOOD:  -Macrocytic anemia  -Leukopenia   COMMENT:   The bone marrow is hypercellular for age with increased number of plasma  cells representing 23% of all cells in the aspirate associated with  interstitial infiltrates and numerous variably sized clusters in the  clot and biopsy sections.  The plasma cells display lambda light chain  restriction consistent with plasma cell neoplasm.  The background shows  trilineage hematopoiesis with generally nonspecific changes particularly  involving megakaryocytes.  Correlation with cytogenetic and FISH studies  is recommended.      RADIOGRAPHIC STUDIES: I have personally reviewed the radiological images as listed and agreed with the findings in the report. No results found.  ASSESSMENT & PLAN:   Dominyk G Smeal is a 83 y.o. caucasian male with      1.  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    2. 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 Gearline  -mild non nephrotic  proteinuria on 24h UPEP-Continue follow up with Dr Gearline for CKD     3. Vitamin B12 deficient  Antibody testing done - no evidence of  pernicious anemia. No evidence of pernicious anemia based on neg IF and parietal cell ab -continue B12 replacement per PCP   4.      Past Medical History:  Diagnosis Date   Arthritis      back   Cancer [facility])      prostate, sees Dr. Gretel Ferrara    Chronic kidney disease 2017    stage 3 kidney disease   Diabetes mellitus     Erectile dysfunction     GERD (gastroesophageal reflux disease)     Hyperlipidemia     Hypertension     Migraines     Peptic ulcer     Thyroid  disease          PLAN:  -Discussed patient's lung results from 08/15/2024 in detail with him CBC is stable with a stable hemoglobin of 12.1, WBC count of 5k and platelets of 202k CMP shows slight bump in creatinine to 2.24 from a baseline of 1.75-2 Myeloma panel shows some increase in his M spike to 3 g/dL IgG lambda paraproteinemia Serum kappa lambda free light chain showed an increase in lambda light chains to 140 with a kappa lambda ratio of 0.15. 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  Patient was offered option to treat his hide smoldering myeloma with daratumumab Faspro.  We discussed the pros and cons of this treatment potential adverse effects, indications and goals of treatment.  Patient notes that he understands the option on the logic behind doing this but would like to spend some more time thinking about whether he would like to continue monitoring things or consider daratumumab.  -will plan for phone visit in 3 months with labs a couple weeks prior to the visit  We discussed and recommended staying up to speed with all age-appropriate vaccinations  with PCP Also discussed with him with close follow-up with his nephrologist to continue to monitor and optimize renal function.   FOLLOW UP: Phone visit with Dr Onesimo in 3 months Labs 2 weeks prior to phone visit  .The total time spent in the appointment was 25 minutes* .  All of the patient's questions were answered with apparent satisfaction. The patient knows to call the clinic with any problems, questions or concerns.   Emaline Onesimo MD MS AAHIVMS [facility] Hematology/Oncology Physician [facility]  .*Total Encounter Time as defined by the Centers for Medicare and Medicaid Services includes, in addition to the face-to-face time of a patient visit (documented in the note above) non-face-to-face time: obtaining and reviewing outside history, ordering and reviewing medications, tests or procedures, care coordination (communications with other health care professionals or caregivers) and documentation in the medical record.   I,Emily Lagle,acting as a Neurosurgeon for Emaline Onesimo, MD.,have documented all relevant documentation on the behalf of Emaline Onesimo, MD,as directed by  Emaline Onesimo, MD while in the presence of Emaline Onesimo, MD.  I have reviewed the above documentation for accuracy and completeness, and I  agree with the above.  Gautam [name], MD

## 2024-09-08 ENCOUNTER — Ambulatory Visit: Payer: Self-pay

## 2024-09-08 NOTE — Telephone Encounter (Signed)
 Noted

## 2024-09-08 NOTE — Telephone Encounter (Signed)
 FYI Only or Action Required?: FYI only for provider.  Patient was last seen in primary care on 04/06/2024 by Johnny Garnette LABOR, MD.  Called Nurse Triage reporting Abdominal Pain.  Symptoms began several days ago.  Interventions attempted: Nothing.  Symptoms are: gradually worsening.  Triage Disposition: Go to ED Now (Notify PCP)  Patient/caregiver understands and will follow disposition?: Yes  Copied from CRM [id number]. Topic: Clinical - Red Word Triage >> Sep 08, 2024 11:53 AM Troy Howard wrote: Red Word that prompted transfer to Nurse Triage:  Abdominal pain, requesting an appt for today Reason for Disposition  [1] SEVERE pain (e.g., excruciating) AND [2] present > 1 hour  Answer Assessment - Initial Assessment Questions Called CAL, Margaret, to check for appointment availability, nothing available  1. LOCATION: Where does it hurt?      Lower abdomen, worse on the left 2. RADIATION: Does the pain shoot anywhere else? (e.g., chest, back)     denies 3. ONSET: When did the pain begin? (Minutes, hours or days ago)      Two days ago 4. SUDDEN: Gradual or sudden onset?     Gradually worsening 5. PATTERN Does the pain come and go, or is it constant?     constant 6. SEVERITY: How bad is the pain?  (e.g., Scale 1-10; mild, moderate, or severe)     8/10 7. RECURRENT SYMPTOM: Have you ever had this type of stomach pain before? If Yes, ask: When was the last time? and What happened that time?      denies 8. CAUSE: What do you think is causing the stomach pain? (e.g., gallstones, recent abdominal surgery)     History of kidney cancer 9. RELIEVING/AGGRAVATING FACTORS: What makes it better or worse? (e.g., antacids, bending or twisting motion, bowel movement)     Worsened by bending forward 10. OTHER SYMPTOMS: Do you have any other symptoms? (e.g., back pain, diarrhea, fever, urination pain, vomiting)       Denies diarrhea, nausea, vomiting, or constipation and fever,  but does report increased frequency of BM today, also positive for fatigue  Protocols used: Abdominal Pain - Male-A-AH

## 2024-09-08 NOTE — Telephone Encounter (Signed)
 Pt has appointment with Dr Johnny on 09/11/24

## 2024-09-08 NOTE — Telephone Encounter (Signed)
 FYI Only or Action Required?: Action required by provider: request for appointment and update on patient condition.  Patient was last seen in primary care on 04/06/2024 by Johnny Garnette LABOR, MD.  Called Nurse Triage reporting Abdominal Pain.  Symptoms began several days ago.  Interventions attempted: Nothing.  Symptoms are: unchanged.  Triage Disposition: See Physician Within 24 Hours  Patient/caregiver understands and will follow disposition?: Yes, but will wait   Copied from CRM [id number]. Topic: Clinical - Red Word Triage >> Sep 08, 2024 12:35 PM Carlyon D wrote: Red Word that prompted transfer to Nurse Triage: PT calling again wants appt Monday [facility] they stated they can not see pt as he was told by NT to go to ED pt states he is not going to ED for abdominal pain. Reason for Disposition  [1] MODERATE pain (e.g., interferes with normal activities) AND [2] pain comes and goes (cramps) AND [3] present > 24 hours  (Exception: Pain with Vomiting or Diarrhea - see that Guideline.)  Answer Assessment - Initial Assessment Questions Additional info:  1) Patient calling back in for additional triage, he does not feel he needs emergency room. Requesting to schedule with PCP on Monday. History of kidney cancer. Scheduled next available appointment with pcp on 09/11/24, patient states if his pain increases becomes constant or develops any new symptoms he will proceed for emergent evaluation. 2) He has not tried any medications or home treatments at this time.    1. LOCATION: Where does it hurt?      Lower abdomen worse left sided 2. RADIATION: Does the pain shoot anywhere else? (e.g., chest, back)     no 3. ONSET: When did the pain begin? (Minutes, hours or days ago)      2 days 4. SUDDEN: Gradual or sudden onset?     Gradual  5. PATTERN Does the pain come and go, or is it constant?     Intermittent for 3 days. Moderate pain when pressing LLQ, mild pain when pressing mid and RLQ.   6. SEVERITY: How bad is the pain?  (e.g., Scale 1-10; mild, moderate, or severe)   Moderate- Able to go about his day. Pain worse when palpating 7. RECURRENT SYMPTOM: Have you ever had this type of stomach pain before? If Yes, ask: When was the last time? and What happened that time?      no 8. CAUSE: What do you think is causing the stomach pain? (e.g., gallstones, recent abdominal surgery)     Unsure. History of cancer  9. RELIEVING/AGGRAVATING FACTORS: What makes it better or worse? (e.g., antacids, bending or twisting motion, bowel movement)     Worse with palpation.  10. OTHER SYMPTOMS: Do you have any other symptoms? (e.g., back pain, diarrhea, fever, urination pain, vomiting)       Fatigue is only other symptom. Specifically denies issues with bowel and bladder, denies fever, rashes, radiating pain, nausea, poor appetite.  Protocols used: Abdominal Pain - Male-A-AH

## 2024-09-11 ENCOUNTER — Encounter: Payer: Self-pay | Admitting: Family Medicine

## 2024-09-11 ENCOUNTER — Ambulatory Visit: Admitting: Family Medicine

## 2024-09-11 VITALS — BP 98/60 | HR 75 | Temp 97.9°F | Wt 190.0 lb

## 2024-09-11 DIAGNOSIS — G4700 Insomnia, unspecified: Secondary | ICD-10-CM

## 2024-09-11 DIAGNOSIS — K5732 Diverticulitis of large intestine without perforation or abscess without bleeding: Secondary | ICD-10-CM

## 2024-09-11 MED ORDER — LORAZEPAM 1 MG PO TABS: 1.0000 mg | ORAL_TABLET | Freq: Every day | ORAL | 2 refills | Status: DC

## 2024-09-11 MED ORDER — METRONIDAZOLE 500 MG PO TABS
500.0000 mg | ORAL_TABLET | Freq: Three times a day (TID) | ORAL | 0 refills | Status: DC
Start: 2024-09-11 — End: 2024-10-16

## 2024-09-11 MED ORDER — CIPROFLOXACIN HCL 500 MG PO TABS: 500.0000 mg | ORAL_TABLET | Freq: Two times a day (BID) | ORAL | 0 refills | Status: DC

## 2024-09-11 NOTE — Progress Notes (Signed)
   Subjective:    Patient ID: Troy Howard, male    DOB: [birth year], 83 y.o.   MRN: [id number]  HPI Here for 2 issues. First he has had intermittent LLQ abdominal pains the past 5 days, and he remind him of when he has diverticulitis. No fever. No urinary symptoms. His stools are regular. He also complains of trouble falling asleep. He has been taking Temazepam  and Trazodone , and once he is asleep he stays asleep. However lately he says when he first goes to bed his mind races and he cannot relax.    Review of Systems  Constitutional: Negative.   Respiratory: Negative.    Cardiovascular: Negative.   Gastrointestinal:  Positive for abdominal pain. Negative for abdominal distention, blood in stool, constipation, diarrhea, nausea and vomiting.  Genitourinary: Negative.   Psychiatric/Behavioral:  Positive for sleep disturbance. Negative for dysphoric mood. The patient is not nervous/anxious.        Objective:   Physical Exam Constitutional:      Appearance: Normal appearance. He is not ill-appearing.  Cardiovascular:     Rate and Rhythm: Normal rate and regular rhythm.     Pulses: Normal pulses.     Heart sounds: Normal heart sounds.  Pulmonary:     Effort: Pulmonary effort is normal.     Breath sounds: Normal breath sounds.  Abdominal:     General: Abdomen is flat. Bowel sounds are normal. There is no distension.     Palpations: Abdomen is soft. There is no mass.     Tenderness: There is no right CVA tenderness, left CVA tenderness, guarding or rebound.     Hernia: No hernia is present.     Comments: He is mildly tender in the LLQ   Neurological:     Mental Status: He is alert.  Psychiatric:        Mood and Affect: Mood normal.        Behavior: Behavior normal.        Thought Content: Thought content normal.           Assessment & Plan:  He is having another flare of diverticulitis, and we will treat this with 7 days of Cipro  and metronidazole . For his insomnia, we  will stop the Temazepam  and substitute Lorazepam 1 mg to take 30 minutes prior to bedtime. He will continue the Trazodone .  Garnette Olmsted, MD

## 2024-09-22 ENCOUNTER — Telehealth: Payer: Self-pay

## 2024-09-22 NOTE — Telephone Encounter (Signed)
 Copied from CRM #[id number]. Topic: Clinical - Medication Question >> Sep 19, 2024 10:48 AM Viola F wrote: Reason for CRM: Patient called regarding the LORazepam (ATIVAN) 1 MG tablet - says the MG is not working for him and wants to know what to do. Please call him at [phone] (M)

## 2024-09-25 MED ORDER — LORAZEPAM 2 MG PO TABS: 2.0000 mg | ORAL_TABLET | Freq: Four times a day (QID) | ORAL | 2 refills | Status: AC | PRN

## 2024-09-25 NOTE — Telephone Encounter (Signed)
 I increased this to 2 mg at bedtime and sent in a new RX

## 2024-09-25 NOTE — Addendum Note (Signed)
 Addended by: JOHNNY SENIOR A on: 09/25/2024 07:53 AM   Modules accepted: Orders

## 2024-09-26 ENCOUNTER — Inpatient Hospital Stay: Attending: Hematology | Admitting: Hematology

## 2024-09-26 DIAGNOSIS — Z79899 Other long term (current) drug therapy: Secondary | ICD-10-CM | POA: Diagnosis not present

## 2024-09-26 DIAGNOSIS — C9000 Multiple myeloma not having achieved remission: Secondary | ICD-10-CM | POA: Insufficient documentation

## 2024-09-26 DIAGNOSIS — N183 Chronic kidney disease, stage 3 unspecified: Secondary | ICD-10-CM | POA: Insufficient documentation

## 2024-09-26 DIAGNOSIS — Z7984 Long term (current) use of oral hypoglycemic drugs: Secondary | ICD-10-CM | POA: Insufficient documentation

## 2024-09-26 DIAGNOSIS — E1122 Type 2 diabetes mellitus with diabetic chronic kidney disease: Secondary | ICD-10-CM | POA: Insufficient documentation

## 2024-09-26 DIAGNOSIS — D649 Anemia, unspecified: Secondary | ICD-10-CM | POA: Diagnosis not present

## 2024-09-26 DIAGNOSIS — I129 Hypertensive chronic kidney disease with stage 1 through stage 4 chronic kidney disease, or unspecified chronic kidney disease: Secondary | ICD-10-CM | POA: Insufficient documentation

## 2024-09-26 DIAGNOSIS — E538 Deficiency of other specified B group vitamins: Secondary | ICD-10-CM | POA: Insufficient documentation

## 2024-09-26 DIAGNOSIS — D472 Monoclonal gammopathy: Secondary | ICD-10-CM | POA: Diagnosis not present

## 2024-09-26 DIAGNOSIS — Z87891 Personal history of nicotine dependence: Secondary | ICD-10-CM | POA: Insufficient documentation

## 2024-09-26 NOTE — Progress Notes (Signed)
 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 male is a here because of positive M-spike. The patient was referred by nephrologist Dr. Gearline. The patient presents to the clinic today accompanied by his wife.   He is being seen by nephrologist who pursued further work up to why his Creatinine has increased. Upon workup his M-protein was found to be 0.8g/dl.  He has had HTN and DM for many years that can affects his Kidney function and was thought to be the likely cause of his CKD.    Today his wife notes he has had muscle pain, muscle mass loss and fatigue.    He has HTN, DM, GERD, Hypothyroid, Hyperlipidemia. He was recently taken off statins, meloxicam , ASA and metformin . He takes Nexium  for her GERD and has been on for many years.    On review of symptoms, pt notes purposeful weight loss, muscle loss and muscle pain. He also has leg pain from b/l knees to anterior shin.  INTERVAL HISTORY: I connected with [name] on 09/26/2024 at  8:40 AM EDT by telephone visit and verified that I am speaking with the correct person using two identifiers.   I discussed the limitations, risks, security and privacy concerns of performing an evaluation and management service by telemedicine and the availability of in-person appointments. I also discussed with the patient that there may be a patient responsible charge related to this service. The patient expressed understanding and agreed to proceed.   Other persons participating in the visit and their role in the encounter: ***   Patient's location: home  Provider's location: [facility]   Chief Complaint: f/u for High risk  Smoldering Myeloma   I last connected with him on 08/29/2024 via telephone; at the time he did not have any concerns and was doing well.   Today, he ***       REVIEW OF SYSTEMS:    10 Point review of systems of done and is negative except as noted above.  MEDICAL HISTORY Past Medical History:  Diagnosis Date   Arthritis    back   Cancer [facility])    prostate, sees Dr. Gretel Howard    Chronic kidney disease 2017   stage 3 kidney disease   Diabetes mellitus    Erectile dysfunction    GERD (gastroesophageal reflux disease)    Hyperlipidemia    Hypertension    Migraines    Peptic ulcer    Thyroid  disease     SURGICAL HISTORY Past Surgical History:  Procedure Laterality Date   APPENDECTOMY  1950   COLONOSCOPY  05/22/2021   per Dr. Aneita, benign polyps, no repeats needed   HERNIA REPAIR     umbilical    MENISCUS REPAIR Left    PROSTATE SURGERY  2009   radical prostatectomy   SPINE SURGERY     L4-L5 fusion per Dr. Oneil Howard    TONSILLECTOMY AND ADENOIDECTOMY  1954    SOCIAL HISTORY Social History   Tobacco Use   Smoking status: Former    Current packs/day: 0.00    Average packs/day: 0.3 packs/day for 5.0 years (1.3 ttl pk-yrs)    Types: Cigarettes    Start date: 1962    Quit  date: 61    Years since quitting: 35.8   Smokeless tobacco: Never  Vaping Use   Vaping status: Never Used  Substance Use Topics   Alcohol use: Not Currently    Comment: rare one glass every 6 months   Drug use: No    Social History   Social History Narrative   Not on file    SOCIAL DRIVERS OF HEALTH SDOH Screenings   Food Insecurity: No Food Insecurity (03/15/2023)  Housing: Low Risk  (03/15/2023)  Transportation Needs: No Transportation Needs (03/15/2023)  Alcohol Screen: Low Risk  (01/15/2022)  Depression (PHQ2-9): Low Risk  (01/25/2024)  Financial Resource Strain: Low Risk  (07/14/2022)  Physical Activity: Insufficiently Active (03/15/2023)  Social Connections: Moderately  Isolated (03/15/2023)  Stress: Stress Concern Present (03/15/2023)  Tobacco Use: Medium Risk (09/11/2024)     FAMILY HISTORY Family History  Problem Relation Age of Onset   Hypertension Other    Cancer Other    Colon cancer Neg Hx    Colon polyps Neg Hx    Esophageal cancer Neg Hx    Rectal cancer Neg Hx    Stomach cancer Neg Hx      ALLERGIES: has no known allergies.  MEDICATIONS  Current Outpatient Medications  Medication Sig Dispense Refill   aspirin-acetaminophen-caffeine  (EXCEDRIN MIGRAINE) 250-250-65 MG tablet Take 1 tablet by mouth every 8 (eight) hours as needed for migraine.     atorvastatin  (LIPITOR) 10 MG tablet Take 1 tablet (10 mg total) by mouth daily. 90 tablet 3   Blood Glucose Monitoring Suppl (ONE TOUCH ULTRA 2) w/Device KIT Use to check blood sugars once daily. 1 kit 0   canagliflozin  (INVOKANA ) 300 MG TABS tablet Take 1 tablet (300 mg total) by mouth daily before breakfast. 90 tablet 3   CHELATED ZINC PO Take 1 capsule by mouth daily.     Cholecalciferol (VITAMIN D3) 2000 units capsule Take 2,000 Units by mouth daily.      ciprofloxacin  (CIPRO ) 500 MG tablet Take 1 tablet (500 mg total) by mouth 2 (two) times daily. 14 tablet 0   Continuous Glucose Sensor (FREESTYLE LIBRE 3 SENSOR) MISC Place 1 sensor on the skin every 14 days. Use to check glucose continuously 2 each 5   Cyanocobalamin  (VITAMIN B 12 PO) Take 2,500 mcg by mouth daily.      esomeprazole  (NEXIUM ) 40 MG capsule Take 1 capsule (40 mg total) by mouth daily. 90 capsule 3   FOLIC ACID PO Take 800 mcg by mouth daily.      glipiZIDE  (GLUCOTROL ) 5 MG tablet Take 1 tablet (5 mg total) by mouth 2 (two) times daily before a meal. 180 tablet 3   Levomefolate Glucosamine (METHYLFOLATE PO) Take 1 tablet by mouth daily.     LORazepam (ATIVAN) 2 MG tablet Take 1 tablet (2 mg total) by mouth every 6 (six) hours as needed for anxiety. 30 tablet 2   metroNIDAZOLE  (FLAGYL ) 500 MG tablet Take 1 tablet (500 mg total)  by mouth 3 (three) times daily. 21 tablet 0   olmesartan  (BENICAR ) 40 MG tablet Take 1 tablet (40 mg total) by mouth daily. 90 tablet 3   Omega-3 Fatty Acids (FISH OIL) 1000 MG CAPS Take by mouth daily.      OneTouch Delica Lancets 30G MISC USE ONCE A DAY WHEN CHECKING BLOOD SUGAR 100 each 3   ONETOUCH VERIO test strip USE ONCE A DAY TO TEST BLOOD SUGAR 100 strip 1   pyridOXINE (VITAMIN B-6) 100 MG tablet  Take 100 mg by mouth daily.     SUMAtriptan  (IMITREX ) 100 MG tablet TAKE 1 TABLET (100 MG TOTAL) BY MOUTH AS NEEDED FOR MIGRAINE. MAY REPEAT IN 2 HOURS IF HEADACHE PERSISTS OR RECURS. 9 tablet 6   SYNTHROID  137 MCG tablet TAKE 1 TABLET BY MOUTH DAILY BEFORE BREAKFAST. 90 tablet 3   traZODone  (DESYREL ) 100 MG tablet Take 1 tablet (100 mg total) by mouth at bedtime. 90 tablet 3   No current facility-administered medications for this visit.    PHYSICAL EXAMINATION TELEPHONE VISIT:  GENERAL: sounds alert, in no acute distress and comfortable PSYCH: sounds alert & oriented x 3 with fluent speech  LABORATORY DATA:   I have reviewed the data as listed     Latest Ref Rng & Units 08/15/2024    9:00 AM 04/12/2024    7:52 AM 02/09/2024    8:07 AM  CBC EXTENDED  WBC 4.0 - 10.5 K/uL 5.0  4.4  3.3   RBC 4.22 - 5.81 MIL/uL 3.91  3.66  3.62   Hemoglobin 13.0 - 17.0 g/dL 87.8  88.6  88.7   HCT 39.0 - 52.0 % 36.5  34.0  33.7   Platelets 150 - 400 K/uL 202  211  225   NEUT# 1.7 - 7.7 K/uL 1.8  1.3  1.5   Lymph# 0.7 - 4.0 K/uL 2.5  2.3  1.3        Latest Ref Rng & Units 08/15/2024    9:00 AM 04/12/2024    7:52 AM 02/09/2024    8:07 AM  CMP  Glucose 70 - 99 mg/dL 82  77  90   BUN 8 - 23 mg/dL 43  30  37   Creatinine 0.61 - 1.24 mg/dL 7.75  7.99  7.92   Sodium 135 - 145 mmol/L 135  135  135   Potassium 3.5 - 5.1 mmol/L 4.6  4.5  4.3   Chloride 98 - 111 mmol/L 107  108  110   CO2 22 - 32 mmol/L 23  22  22    Calcium  8.9 - 10.3 mg/dL 9.1  8.6  8.5   Total Protein 6.5 - 8.1 g/dL 9.5  9.2  9.0    Total Bilirubin 0.0 - 1.2 mg/dL 0.6  0.3  0.4   Alkaline Phos 38 - 126 U/L 79  56  75   AST 15 - 41 U/L 11  11  8    ALT 0 - 44 U/L 11  9  7     MULTIPLE MYELOMA & KAPPA/LAMBDA LIGHT CHAINS 11/2022 - 07/2024  IFE Immunofixation shows IgG monoclonal protein with lambda light chain specificity.    Surgical Pathology 10/12/2023  CASE: [id number] PATIENT: Bishoy Elmore Bone Marrow Report   Clinical History: Smoldering Myleoma  DIAGNOSIS:  BONE MARROW, ASPIRATE, CLOT, CORE: -Hypercellular bone marrow with plasma cell neoplasm -See comment  PERIPHERAL BLOOD: -Macrocytic anemia -Leukopenia  COMMENT:  The bone marrow is hypercellular for age with increased number of plasma cells representing 23% of all cells in the aspirate associated with interstitial infiltrates and numerous variably sized clusters in the clot and biopsy sections.  The plasma cells display lambda light chain restriction consistent with plasma cell neoplasm.  The background shows trilineage hematopoiesis with generally nonspecific changes particularly involving megakaryocytes.  Correlation with cytogenetic and FISH studies is recommended.  MICROSCOPIC DESCRIPTION:  PERIPHERAL BLOOD SMEAR: The red blood cells display mild anisopoikilocytosis with mild polychromasia.  The white blood cells are decreased number but with no  significant morphologic abnormalities.  The platelets are normal in number.  BONE MARROW ASPIRATE: Bone marrow particles present Erythroid precursors: Orderly and progressive maturation for the most part.  Occasional late precursors display nuclear cytoplasmic dyssynchrony. Granulocytic precursors: Orderly and progressive maturation Megakaryocytes: Abundant with scattered large, atypical forms Lymphocytes/plasma cells: The plasma cells are increased in number representing 23% of all cells but with lack of large aggregates or sheets.  Some of the plasma cells display atypical features  with cytomegaly and/or small nucleoli.  Large lymphoid aggregates are not seen.  TOUCH PREPARATIONS: A mixture of cell types present including increased number of plasma cells  CLOT AND BIOPSY: The sections show cellularity ranging from 40 to 70% with interstitial infiltrates and numerous variably sized clusters of plasma cells in a background of trilineage hematopoiesis.  Significant lymphoid aggregates are not seen.  Immunohistochemical stain for CD138 and in situ hybridization for kappa and lambda were performed on blocks B1 and C1 with appropriate controls.  CD138 highlights the plasma cell component which displays lambda light chain restriction.  IRON STAIN: Iron stains are performed on a bone marrow aspirate or touch imprint smear and section of clot. The controls stained appropriately.       Storage Iron: Present      Ring Sideroblasts: Absent  ADDITIONAL DATA/TESTING: The bone marrow specimen was sent for cytogenetic analysis and FISH for multiple myeloma and a separate report will follow.  CELL COUNT DATA:  Bone Marrow count performed on 500 cells shows: Blasts:   0%   Myeloid:  41% Promyelocytes: 1%   Erythroid:     26% Myelocytes:    5%   Lymphocytes:   8% Metamyelocytes:     3%   Plasma cells:  23% Bands:    9% Neutrophils:   20%  M:E ratio:     1.6 Eosinophils:   3% Basophils:     0% Monocytes:     2%  Lab Data: CBC performed on 10/12/2023 shows: WBC: 3.6 k/uL  Neutrophils:   47% Hgb: 11.5 g/dL Lymphocytes:   60% HCT: 37.1 %    Monocytes:     13% MCV: 101.9 fL  Eosinophils:   1% RDW: 14.4 %    Basophils:     0% PLT: 191 k/uL  GROSS DESCRIPTION:  A.  BM-aspirate smear  B.  Received in B-plus fixative is a 1.2 x 0.8 x 0.2 cm aggregate of tissue.  Submitted entirely in B1.  C.  Received in B-plus fixative are 4 cores of firm bone ranging from 0.7-1.1 cm in length and each measuring 0.2 cm in diameter.  Submitted entirely in C1, following  decalcification in Immunocal. (KW, 10/12/2023)    10/12/2023   10/12/2023   Surgical Pathology 03/27/2021  CASE: [id number]  PATIENT: Samule Claflin  Bone Marrow Report   Clinical History: Smoldering Myleoma   DIAGNOSIS:   BONE MARROW, ASPIRATE, CLOT, CORE:  -Hypercellular bone marrow with plasma cell neoplasm  -See comment   PERIPHERAL BLOOD:  -Macrocytic anemia  -Leukopenia   COMMENT:   The bone marrow is hypercellular for age with increased number of plasma  cells representing 23% of all cells in the aspirate associated with  interstitial infiltrates and numerous variably sized clusters in the  clot and biopsy sections.  The plasma cells display lambda light chain  restriction consistent with plasma cell neoplasm.  The background shows  trilineage hematopoiesis with generally nonspecific changes particularly  involving megakaryocytes.  Correlation with cytogenetic and FISH studies  is recommended.    03/27/2021 Molecular Pathology     03/27/2021 Cytogenetics    RADIOGRAPHIC STUDIES: I have personally reviewed the radiological images as listed and agreed with the findings in the report. No results found.   ASSESSMENT & PLAN:  83 y.o. male with  1.  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    2. 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 Gearline  -mild non nephrotic proteinuria on 24h UPEP-Continue follow up with Dr Troy Howard for CKD     3. Vitamin B12 deficient  Antibody testing done - no evidence of pernicious anemia. No evidence of pernicious anemia based on neg IF and parietal cell ab -continue B12 replacement per PCP   4.  Patient Active Problem List   Diagnosis Date Noted   CKD  (chronic kidney disease) stage 3, GFR 30-59 ml/min (HCC) 11/02/2023   Insomnia 03/16/2023   COVID-19 virus infection 12/29/2021   Dyslipidemia 02/22/2018   Hypothyroidism 02/22/2018   Diabetes mellitus without complication (HCC) 08/14/2014   PROSTATE CANCER, HX OF 11/13/2010   GERD 01/17/2008   Acute peptic ulcer with hemorrhage 01/17/2008   ERECTILE DYSFUNCTION 11/23/2007   HYPERTENSION, BENIGN 11/23/2007   PROSTATE SPECIFIC ANTIGEN, ELEVATED 11/23/2007   Migraine headache 06/13/2007   PLAN:        FOLLOW UP:    The total time spent in the appointment was *** minutes* .  All of the patient's questions were answered and the patient knows to call the clinic with any problems, questions, or concerns.  Emaline Saran MD MS AAHIVMS [facility] Hematology/Oncology Physician [facility]  *Total Encounter Time as defined by the Centers for Medicare and Medicaid Services includes, in addition to the face-to-face time of a patient visit (documented in the note above) non-face-to-face time: obtaining and reviewing outside history, ordering and reviewing medications, tests or procedures, care coordination (communications with other health care professionals or caregivers) and documentation in the medical record.  I,Emily Lagle,acting as a neurosurgeon for Emaline Saran, MD.,have documented all relevant documentation on the behalf of Emaline Saran, MD,as directed by  Emaline Saran, MD while in the presence of Emaline Saran, MD.  I have reviewed the above documentation for accuracy and completeness, and I agree with the above.   , MD

## 2024-09-27 NOTE — Telephone Encounter (Signed)
 FYI Pt notified to pick up the new dose for Lorazepam 2 mg from the pharmacy. Pt wanted to update Dr Johnny that he has been diagnosed with smoldering Myeloma and will be starting Steroids injections treatment  ( Daratumumab)in the next 3 weeks, pt was told that this could spike his blood glucose and wants to know what he needs to do before starting injections. Please advise

## 2024-09-27 NOTE — Telephone Encounter (Signed)
 He does not really need to do anything different other than watch his glucoses closely. After he has been talking the treatments for 2 weeks, report back to us  what his readings are doing

## 2024-09-28 ENCOUNTER — Other Ambulatory Visit: Payer: Self-pay | Admitting: Family Medicine

## 2024-09-29 NOTE — Telephone Encounter (Signed)
 Spoke with pt advised of Dr Johnny recommendation, voiced understanding.

## 2024-10-10 ENCOUNTER — Encounter: Payer: Self-pay | Admitting: Hematology

## 2024-10-10 DIAGNOSIS — D472 Monoclonal gammopathy: Secondary | ICD-10-CM | POA: Insufficient documentation

## 2024-10-10 MED ORDER — ONDANSETRON HCL 8 MG PO TABS: 8.0000 mg | ORAL_TABLET | Freq: Three times a day (TID) | ORAL | 1 refills | Status: DC | PRN

## 2024-10-10 MED ORDER — DEXAMETHASONE 4 MG PO TABS: 12.0000 mg | ORAL_TABLET | Freq: Every day | ORAL | 4 refills | Status: DC

## 2024-10-10 MED ORDER — ACYCLOVIR 400 MG PO TABS: 400.0000 mg | ORAL_TABLET | Freq: Two times a day (BID) | ORAL | 11 refills | Status: AC

## 2024-10-11 ENCOUNTER — Other Ambulatory Visit: Payer: Self-pay

## 2024-10-15 ENCOUNTER — Other Ambulatory Visit: Payer: Self-pay | Admitting: Family Medicine

## 2024-10-15 DIAGNOSIS — N390 Urinary tract infection, site not specified: Secondary | ICD-10-CM

## 2024-10-17 DIAGNOSIS — H25043 Posterior subcapsular polar age-related cataract, bilateral: Secondary | ICD-10-CM | POA: Diagnosis not present

## 2024-10-17 DIAGNOSIS — H2511 Age-related nuclear cataract, right eye: Secondary | ICD-10-CM | POA: Diagnosis not present

## 2024-10-17 DIAGNOSIS — H25013 Cortical age-related cataract, bilateral: Secondary | ICD-10-CM | POA: Diagnosis not present

## 2024-10-17 DIAGNOSIS — H18413 Arcus senilis, bilateral: Secondary | ICD-10-CM | POA: Diagnosis not present

## 2024-10-17 DIAGNOSIS — H2513 Age-related nuclear cataract, bilateral: Secondary | ICD-10-CM | POA: Diagnosis not present

## 2024-10-20 ENCOUNTER — Other Ambulatory Visit: Payer: Self-pay | Admitting: Family Medicine

## 2024-10-20 ENCOUNTER — Inpatient Hospital Stay: Attending: Hematology

## 2024-10-20 DIAGNOSIS — Z79899 Other long term (current) drug therapy: Secondary | ICD-10-CM | POA: Insufficient documentation

## 2024-10-20 DIAGNOSIS — C9000 Multiple myeloma not having achieved remission: Secondary | ICD-10-CM | POA: Insufficient documentation

## 2024-10-20 DIAGNOSIS — Z5112 Encounter for antineoplastic immunotherapy: Secondary | ICD-10-CM | POA: Insufficient documentation

## 2024-10-23 ENCOUNTER — Inpatient Hospital Stay: Admitting: Hematology

## 2024-10-23 ENCOUNTER — Inpatient Hospital Stay

## 2024-10-23 ENCOUNTER — Other Ambulatory Visit (HOSPITAL_COMMUNITY): Payer: Self-pay

## 2024-10-23 ENCOUNTER — Encounter: Payer: Self-pay | Admitting: Hematology

## 2024-10-23 VITALS — BP 125/59 | HR 66 | Temp 97.9°F | Resp 20 | Wt 191.6 lb

## 2024-10-23 VITALS — BP 126/66 | HR 68 | Resp 14

## 2024-10-23 DIAGNOSIS — Z5112 Encounter for antineoplastic immunotherapy: Secondary | ICD-10-CM | POA: Diagnosis not present

## 2024-10-23 DIAGNOSIS — D472 Monoclonal gammopathy: Secondary | ICD-10-CM

## 2024-10-23 DIAGNOSIS — Z79899 Other long term (current) drug therapy: Secondary | ICD-10-CM | POA: Diagnosis not present

## 2024-10-23 DIAGNOSIS — C9000 Multiple myeloma not having achieved remission: Secondary | ICD-10-CM | POA: Diagnosis not present

## 2024-10-23 LAB — CBC WITH DIFFERENTIAL (CANCER CENTER ONLY)
Abs Immature Granulocytes: 0.01 K/uL (ref 0.00–0.07)
Basophils Absolute: 0 K/uL (ref 0.0–0.1)
Basophils Relative: 0 %
Eosinophils Absolute: 0.1 K/uL (ref 0.0–0.5)
Eosinophils Relative: 2 %
HCT: 33.2 % — ABNORMAL LOW (ref 39.0–52.0)
Hemoglobin: 11.2 g/dL — ABNORMAL LOW (ref 13.0–17.0)
Immature Granulocytes: 0 %
Lymphocytes Relative: 46 %
Lymphs Abs: 1.7 K/uL (ref 0.7–4.0)
MCH: 31.3 pg (ref 26.0–34.0)
MCHC: 33.7 g/dL (ref 30.0–36.0)
MCV: 92.7 fL (ref 80.0–100.0)
Monocytes Absolute: 0.5 K/uL (ref 0.1–1.0)
Monocytes Relative: 12 %
Neutro Abs: 1.5 K/uL — ABNORMAL LOW (ref 1.7–7.7)
Neutrophils Relative %: 40 %
Platelet Count: 191 K/uL (ref 150–400)
RBC: 3.58 MIL/uL — ABNORMAL LOW (ref 4.22–5.81)
RDW: 14.4 % (ref 11.5–15.5)
WBC Count: 3.8 K/uL — ABNORMAL LOW (ref 4.0–10.5)
nRBC: 0 % (ref 0.0–0.2)

## 2024-10-23 LAB — TYPE AND SCREEN
ABO/RH(D): A POS
Antibody Screen: NEGATIVE

## 2024-10-23 LAB — PRETREATMENT RBC PHENOTYPE

## 2024-10-23 MED ORDER — MONTELUKAST SODIUM 10 MG PO TABS
10.0000 mg | ORAL_TABLET | Freq: Once | ORAL | Status: AC
  Administered 2024-10-23: 10 mg via ORAL
  Filled 2024-10-23: qty 1

## 2024-10-23 MED ORDER — ONDANSETRON HCL 8 MG PO TABS
8.0000 mg | ORAL_TABLET | Freq: Three times a day (TID) | ORAL | 1 refills | Status: AC | PRN
Start: 2024-10-23 — End: ?
  Filled 2024-10-23: qty 30, 10d supply, fill #0

## 2024-10-23 MED ORDER — DEXAMETHASONE 6 MG PO TABS
20.0000 mg | ORAL_TABLET | Freq: Once | ORAL | Status: AC
  Administered 2024-10-23: 20 mg via ORAL
  Filled 2024-10-23: qty 2

## 2024-10-23 MED ORDER — FAMOTIDINE 20 MG PO TABS
20.0000 mg | ORAL_TABLET | Freq: Once | ORAL | Status: AC
  Administered 2024-10-23: 20 mg via ORAL
  Filled 2024-10-23: qty 1

## 2024-10-23 MED ORDER — DARATUMUMAB-HYALURONIDASE-FIHJ 1800-30000 MG-UT/15ML ~~LOC~~ SOLN
1800.0000 mg | Freq: Once | SUBCUTANEOUS | Status: AC
  Administered 2024-10-23: 1800 mg via SUBCUTANEOUS
  Filled 2024-10-23: qty 15

## 2024-10-23 MED ORDER — DIPHENHYDRAMINE HCL 25 MG PO CAPS
50.0000 mg | ORAL_CAPSULE | Freq: Once | ORAL | Status: AC
  Administered 2024-10-23: 50 mg via ORAL
  Filled 2024-10-23: qty 2

## 2024-10-23 MED ORDER — ACETAMINOPHEN 325 MG PO TABS
650.0000 mg | ORAL_TABLET | Freq: Once | ORAL | Status: AC
  Administered 2024-10-23: 650 mg via ORAL
  Filled 2024-10-23: qty 2

## 2024-10-23 NOTE — Patient Instructions (Addendum)

## 2024-10-23 NOTE — Progress Notes (Signed)
 Pt observed for 2 hours post first time Darzalex Faspro injection. Pt tolerated Tx well w/out incident. VSS at discharge.  Ambulatory to lobby.

## 2024-10-23 NOTE — Progress Notes (Incomplete)
 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 male is a here because of positive M-spike. The patient was referred by nephrologist Dr. Gearline. The patient presents to the clinic today accompanied by his wife.   He is being seen by nephrologist who pursued further work up to why his Creatinine has increased. Upon workup his M-protein was found to be 0.8g/dl.  He has had HTN and DM for many years that can affects his Kidney function and was thought to be the likely cause of his CKD.    Today his wife notes he has had muscle pain, muscle mass loss and fatigue.    He has HTN, DM, GERD, Hypothyroid, Hyperlipidemia. He was recently taken off statins, meloxicam , ASA and metformin . He takes Nexium  for her GERD and has been on for many years.    On review of symptoms, pt notes purposeful weight loss, muscle loss and muscle pain. He also has leg pain from b/l knees to anterior shin.    SUMMARY OF ONCOLOGIC HISTORY: Oncology History  Smoldering myeloma  10/10/2024 Initial Diagnosis   Smoldering myeloma   10/23/2024 -  Chemotherapy   Patient is on Treatment Plan : MYELOMA Daratumumab  SQ q28d      Cycle *** / Day *** of Patient is on Treatment Plan :  MYELOMA Daratumumab  SQ q28d   INTERVAL HISTORY: Troy Howard is a 83 y.o. 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 {ELcompanions/ambulations (Optional):33762}  he was last seen by me on 09/26/2024 via telephone; at the time he did not have any concerns and was doing well.   Today, he  Denies any  []  new infection issues,  []  fevers/chills,  []   drenching night sweats,  []  unexpected weight change,  []  back pain,  []  chest pain,  []  abdominal pain,  []  new bone pains,  []  new lumps/bumps,  []  leg swelling,  []  bleeding issues (nose bleeds, gum bleeds, abnormal/spontaneous bruising),  []  nausea/vomiting,  []  diarrhea,  []  bowel/urinary changes, []  SOB,  []  change in breathing        REVIEW OF SYSTEMS:   10 Point review of systems of done and is negative except as noted above.  MEDICAL HISTORY Past Medical History:  Diagnosis Date   Arthritis    back   Cancer [facility])    prostate, sees Dr. Gretel Ferrara    Chronic kidney disease 2017   stage 3 kidney disease   Diabetes mellitus    Erectile dysfunction    GERD (gastroesophageal reflux disease)    Hyperlipidemia    Hypertension    Migraines    Peptic ulcer    Thyroid  disease     SURGICAL HISTORY Past Surgical History:  Procedure Laterality Date   APPENDECTOMY  1950   COLONOSCOPY  05/22/2021   per Dr. Aneita, benign polyps, no repeats needed   HERNIA REPAIR     umbilical    MENISCUS REPAIR Left    PROSTATE SURGERY  2009   radical prostatectomy   SPINE SURGERY     L4-L5 fusion per Dr. Oneil Carwin    TONSILLECTOMY AND ADENOIDECTOMY  1954    SOCIAL HISTORY  Social History   Tobacco Use   Smoking status: Former    Current packs/day: 0.00    Average packs/day: 0.3 packs/day for 5.0 years (1.3 ttl pk-yrs)    Types: Cigarettes    Start date: 21    Quit date: 23    Years since quitting: 58.9   Smokeless tobacco: Never  Vaping Use   Vaping status: Never Used  Substance Use Topics   Alcohol use: Not Currently    Comment: rare one glass every 6 months   Drug use: No    Social History   Social History Narrative   Not on file    SOCIAL DRIVERS OF HEALTH SDOH Screenings   Food Insecurity: No Food Insecurity (03/15/2023)  Housing: Low Risk  (03/15/2023)  Transportation Needs: No Transportation Needs (03/15/2023)  Alcohol Screen: Low Risk   (01/15/2022)  Depression (PHQ2-9): Low Risk  (01/25/2024)  Financial Resource Strain: Low Risk  (07/14/2022)  Physical Activity: Insufficiently Active (03/15/2023)  Social Connections: Moderately Isolated (03/15/2023)  Stress: Stress Concern Present (03/15/2023)  Tobacco Use: Medium Risk (09/11/2024)     FAMILY HISTORY Family History  Problem Relation Age of Onset   Hypertension Other    Cancer Other    Colon cancer Neg Hx    Colon polyps Neg Hx    Esophageal cancer Neg Hx    Rectal cancer Neg Hx    Stomach cancer Neg Hx      ALLERGIES: has no known allergies.  MEDICATIONS  Current Outpatient Medications  Medication Sig Dispense Refill   acyclovir  (ZOVIRAX ) 400 MG tablet Take 1 tablet (400 mg total) by mouth 2 (two) times daily. 60 tablet 11   aspirin-acetaminophen -caffeine  (EXCEDRIN MIGRAINE) 250-250-65 MG tablet Take 1 tablet by mouth every 8 (eight) hours as needed for migraine.     atorvastatin  (LIPITOR) 10 MG tablet Take 1 tablet (10 mg total) by mouth daily. 90 tablet 3   Blood Glucose Monitoring Suppl (ONE TOUCH ULTRA 2) w/Device KIT Use to check blood sugars once daily. 1 kit 0   canagliflozin  (INVOKANA ) 300 MG TABS tablet Take 1 tablet (300 mg total) by mouth daily before breakfast. 90 tablet 3   CHELATED ZINC PO Take 1 capsule by mouth daily.     Cholecalciferol (VITAMIN D3) 2000 units capsule Take 2,000 Units by mouth daily.      ciprofloxacin  (CIPRO ) 500 MG tablet TAKE 1 TABLET BY MOUTH TWICE A DAY 14 tablet 0   Continuous Glucose Sensor (FREESTYLE LIBRE 3 SENSOR) MISC Place 1 sensor on the skin every 14 days. Use to check glucose continuously 2 each 5   Cyanocobalamin  (VITAMIN B 12 PO) Take 2,500 mcg by mouth daily.      dexamethasone  (DECADRON ) 4 MG tablet Take 3 tablets (12 mg total) by mouth daily. Take with breakfast the morning after each treatment. 20 tablet 4   esomeprazole  (NEXIUM ) 40 MG capsule Take 1 capsule (40 mg total) by mouth daily. 90 capsule 3   FOLIC  ACID PO Take 800 mcg by mouth daily.      glipiZIDE  (GLUCOTROL ) 5 MG tablet Take 1 tablet (5 mg total) by mouth 2 (two) times daily before a meal. 180 tablet 3   Levomefolate Glucosamine (METHYLFOLATE PO) Take 1 tablet by mouth daily.     LORazepam  (ATIVAN ) 2 MG tablet Take 1 tablet (2 mg total) by mouth every 6 (six) hours as needed for anxiety. 30 tablet 2   metroNIDAZOLE  (FLAGYL ) 500 MG tablet TAKE 1 TABLET BY  MOUTH THREE TIMES A DAY 21 tablet 0   olmesartan  (BENICAR ) 40 MG tablet Take 1 tablet (40 mg total) by mouth daily. 90 tablet 3   Omega-3 Fatty Acids (FISH OIL) 1000 MG CAPS Take by mouth daily.      ondansetron  (ZOFRAN ) 8 MG tablet Take 1 tablet (8 mg total) by mouth every 8 (eight) hours as needed for nausea or vomiting. 30 tablet 1   OneTouch Delica Lancets 30G MISC USE ONCE A DAY WHEN CHECKING BLOOD SUGAR 100 each 3   ONETOUCH VERIO test strip USE ONCE A DAY TO TEST BLOOD SUGAR 100 strip 1   pyridOXINE (VITAMIN B-6) 100 MG tablet Take 100 mg by mouth daily.     sulfamethoxazole -trimethoprim  (BACTRIM  DS) 800-160 MG tablet TAKE 1 TABLET BY MOUTH TWICE A DAY FOR 10 DAYS 20 tablet 0   SUMAtriptan  (IMITREX ) 100 MG tablet TAKE 1 TABLET (100 MG TOTAL) BY MOUTH AS NEEDED FOR MIGRAINE. MAY REPEAT IN 2 HOURS IF HEADACHE PERSISTS OR RECURS. 9 tablet 6   SYNTHROID  137 MCG tablet TAKE 1 TABLET BY MOUTH DAILY BEFORE BREAKFAST. 90 tablet 3   traZODone  (DESYREL ) 100 MG tablet TAKE 1 TABLET BY MOUTH EVERYDAY AT BEDTIME 90 tablet 3   No current facility-administered medications for this visit.    PHYSICAL EXAMINATION: ECOG PERFORMANCE STATUS: {CHL ONC ECOG ED:8845999799} VITALS: There were no vitals filed for this visit. There were no vitals filed for this visit. There is no height or weight on file to calculate BMI.  GENERAL: alert, in no acute distress and comfortable SKIN: no acute rashes, no significant lesions EYES: conjunctiva are pink and non-injected, sclera anicteric OROPHARYNX: MMM,  no exudates, no oropharyngeal erythema or ulceration NECK: supple, no JVD LYMPH:  no palpable lymphadenopathy in the cervical, axillary or inguinal regions LUNGS: clear to auscultation b/l with normal respiratory effort HEART: regular rate & rhythm ABDOMEN:  normoactive bowel sounds , non tender, not distended, no hepatosplenomegaly Extremity: no pedal edema PSYCH: alert & oriented x 3 with fluent speech NEURO: no focal motor/sensory deficits  LABORATORY DATA:   I have reviewed the data as listed     Latest Ref Rng & Units 08/15/2024    9:00 AM 04/12/2024    7:52 AM 02/09/2024    8:07 AM  CBC EXTENDED  WBC 4.0 - 10.5 K/uL 5.0  4.4  3.3   RBC 4.22 - 5.81 MIL/uL 3.91  3.66  3.62   Hemoglobin 13.0 - 17.0 g/dL 87.8  88.6  88.7   HCT 39.0 - 52.0 % 36.5  34.0  33.7   Platelets 150 - 400 K/uL 202  211  225   NEUT# 1.7 - 7.7 K/uL 1.8  1.3  1.5   Lymph# 0.7 - 4.0 K/uL 2.5  2.3  1.3     {ELAddOncLabs (Optional):33736}     Latest Ref Rng & Units 08/15/2024    9:00 AM 04/12/2024    7:52 AM 02/09/2024    8:07 AM  CMP  Glucose 70 - 99 mg/dL 82  77  90   BUN 8 - 23 mg/dL 43  30  37   Creatinine 0.61 - 1.24 mg/dL 7.75  7.99  7.92   Sodium 135 - 145 mmol/L 135  135  135   Potassium 3.5 - 5.1 mmol/L 4.6  4.5  4.3   Chloride 98 - 111 mmol/L 107  108  110   CO2 22 - 32 mmol/L 23  22  22  Calcium  8.9 - 10.3 mg/dL 9.1  8.6  8.5   Total Protein 6.5 - 8.1 g/dL 9.5  9.2  9.0   Total Bilirubin 0.0 - 1.2 mg/dL 0.6  0.3  0.4   Alkaline Phos 38 - 126 U/L 79  56  75   AST 15 - 41 U/L 11  11  8    ALT 0 - 44 U/L 11  9  7     MULTIPLE MYELOMA & KAPPA/LAMBDA LIGHT CHAINS 11/2022 - 07/2024  IFE Immunofixation shows IgG monoclonal protein with lambda light chain specificity.      Surgical Pathology 10/12/2023  CASE: [id number] PATIENT: Troy Howard Bone Marrow Report   Clinical History: Smoldering Myleoma  DIAGNOSIS:  BONE MARROW, ASPIRATE, CLOT, CORE: -Hypercellular bone marrow with  plasma cell neoplasm -See comment  PERIPHERAL BLOOD: -Macrocytic anemia -Leukopenia  COMMENT:  The bone marrow is hypercellular for age with increased number of plasma cells representing 23% of all cells in the aspirate associated with interstitial infiltrates and numerous variably sized clusters in the clot and biopsy sections.  The plasma cells display lambda light chain restriction consistent with plasma cell neoplasm.  The background shows trilineage hematopoiesis with generally nonspecific changes particularly involving megakaryocytes.  Correlation with cytogenetic and FISH studies is recommended.  MICROSCOPIC DESCRIPTION:  PERIPHERAL BLOOD SMEAR: The red blood cells display mild anisopoikilocytosis with mild polychromasia.  The white blood cells are decreased number but with no significant morphologic abnormalities.  The platelets are normal in number.  BONE MARROW ASPIRATE: Bone marrow particles present Erythroid precursors: Orderly and progressive maturation for the most part.  Occasional late precursors display nuclear cytoplasmic dyssynchrony. Granulocytic precursors: Orderly and progressive maturation Megakaryocytes: Abundant with scattered large, atypical forms Lymphocytes/plasma cells: The plasma cells are increased in number representing 23% of all cells but with lack of large aggregates or sheets.  Some of the plasma cells display atypical features with cytomegaly and/or small nucleoli.  Large lymphoid aggregates are not seen.  TOUCH PREPARATIONS: A mixture of cell types present including increased number of plasma cells  CLOT AND BIOPSY: The sections show cellularity ranging from 40 to 70% with interstitial infiltrates and numerous variably sized clusters of plasma cells in a background of trilineage hematopoiesis.  Significant lymphoid aggregates are not seen.  Immunohistochemical stain for CD138 and in situ hybridization for kappa and lambda were  performed on blocks B1 and C1 with appropriate controls.  CD138 highlights the plasma cell component which displays lambda light chain restriction.  IRON STAIN: Iron stains are performed on a bone marrow aspirate or touch imprint smear and section of clot. The controls stained appropriately.       Storage Iron: Present      Ring Sideroblasts: Absent  ADDITIONAL DATA/TESTING: The bone marrow specimen was sent for cytogenetic analysis and FISH for multiple myeloma and a separate report will follow.  CELL COUNT DATA:  Bone Marrow count performed on 500 cells shows: Blasts:   0%   Myeloid:  41% Promyelocytes: 1%   Erythroid:     26% Myelocytes:    5%   Lymphocytes:   8% Metamyelocytes:     3%   Plasma cells:  23% Bands:    9% Neutrophils:   20%  M:E ratio:     1.6 Eosinophils:   3% Basophils:     0% Monocytes:     2%  Lab Data: CBC performed on 10/12/2023 shows: WBC: 3.6 k/uL  Neutrophils:   47% Hgb: 11.5 g/dL Lymphocytes:  39% HCT: 37.1 %    Monocytes:     13% MCV: 101.9 fL  Eosinophils:   1% RDW: 14.4 %    Basophils:     0% PLT: 191 k/uL  GROSS DESCRIPTION:  A.  BM-aspirate smear  B.  Received in B-plus fixative is a 1.2 x 0.8 x 0.2 cm aggregate of tissue.  Submitted entirely in B1.  C.  Received in B-plus fixative are 4 cores of firm bone ranging from 0.7-1.1 cm in length and each measuring 0.2 cm in diameter.  Submitted entirely in C1, following decalcification in Immunocal. (KW, 10/12/2023)     10/12/2023    10/12/2023    Surgical Pathology 03/27/2021  CASE: [id number]  PATIENT: Troy Howard  Bone Marrow Report   Clinical History: Smoldering Myleoma   DIAGNOSIS:   BONE MARROW, ASPIRATE, CLOT, CORE:  -Hypercellular bone marrow with plasma cell neoplasm  -See comment   PERIPHERAL BLOOD:  -Macrocytic anemia  -Leukopenia   COMMENT:   The bone marrow is hypercellular for age with increased number of plasma  cells representing 23% of all  cells in the aspirate associated with  interstitial infiltrates and numerous variably sized clusters in the  clot and biopsy sections.  The plasma cells display lambda light chain  restriction consistent with plasma cell neoplasm.  The background shows  trilineage hematopoiesis with generally nonspecific changes particularly  involving megakaryocytes.  Correlation with cytogenetic and FISH studies  is recommended.     03/27/2021 Molecular Pathology     03/27/2021 Cytogenetics     RADIOGRAPHIC STUDIES: I have personally reviewed the radiological images as listed and agreed with the findings in the report. No results found.   ASSESSMENT & PLAN:  83 y.o. male with  1.  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    2. 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 Gearline  -mild non nephrotic proteinuria on 24h UPEP-Continue follow up with Dr Gearline for CKD     3. Vitamin B12 deficient  Antibody testing done - no evidence of pernicious anemia. No evidence of pernicious anemia based on neg IF and parietal cell ab -continue B12 replacement per PCP   4.  Patient Active Problem List   Diagnosis Date Noted   Smoldering myeloma 10/10/2024   CKD (chronic kidney disease) stage 3, GFR 30-59 ml/min (HCC) 11/02/2023   Insomnia 03/16/2023   COVID-19 virus infection 12/29/2021   Dyslipidemia 02/22/2018   Hypothyroidism 02/22/2018   Diabetes mellitus without complication (HCC) 08/14/2014   PROSTATE CANCER, HX OF 11/13/2010   GERD 01/17/2008   Acute peptic ulcer with hemorrhage 01/17/2008   ERECTILE DYSFUNCTION 11/23/2007   HYPERTENSION, BENIGN 11/23/2007   PROSTATE SPECIFIC ANTIGEN, ELEVATED 11/23/2007   Migraine headache  06/13/2007   PLAN: - Discussed lab results on 10/23/2024 in detail with patient: CBC showed {ELGKCBC:33763} CMP with Creatinine *** {ELIncreased/Decreased:33631} from *** and Calcium  *** {ELIncreased/Decreased:33631} from ***.  {ELGKMyeloma&Lightchains (Optional):33764}  FOLLOW-UP in {WEEKS/MONTHS:30939} for labs and follow-up with Dr. Onesimo.  The total time spent in the appointment was *** minutes* .  All of the patient's questions were answered and the patient knows to call the clinic with any problems, questions, or concerns.  Emaline Onesimo MD  MS AAHIVMS [facility] Hematology/Oncology Physician [facility]  *Total Encounter Time as defined by the Centers for Medicare and Medicaid Services includes, in addition to the face-to-face time of a patient visit (documented in the note above) non-face-to-face time: obtaining and reviewing outside history, ordering and reviewing medications, tests or procedures, care coordination (communications with other health care professionals or caregivers) and documentation in the medical record.  I,Emily Lagle,acting as a neurosurgeon for Emaline Saran, MD.,have documented all relevant documentation on the behalf of Emaline Saran, MD,as directed by  Emaline Saran, MD while in the presence of Emaline Saran, MD.  I have reviewed the above documentation for accuracy and completeness, and I agree with the above.  Gautam [name], MD

## 2024-10-24 ENCOUNTER — Ambulatory Visit: Payer: Self-pay | Admitting: *Deleted

## 2024-10-25 ENCOUNTER — Other Ambulatory Visit: Payer: Self-pay

## 2024-10-26 ENCOUNTER — Other Ambulatory Visit: Payer: Self-pay | Admitting: Family Medicine

## 2024-10-27 ENCOUNTER — Other Ambulatory Visit: Payer: Self-pay

## 2024-10-30 ENCOUNTER — Inpatient Hospital Stay (HOSPITAL_BASED_OUTPATIENT_CLINIC_OR_DEPARTMENT_OTHER): Admitting: Hematology

## 2024-10-30 ENCOUNTER — Inpatient Hospital Stay: Attending: Hematology

## 2024-10-30 ENCOUNTER — Other Ambulatory Visit: Payer: Self-pay

## 2024-10-30 VITALS — BP 100/55 | HR 76 | Temp 97.5°F | Resp 20 | Wt 193.0 lb

## 2024-10-30 VITALS — BP 128/65 | HR 68 | Temp 97.9°F | Resp 20

## 2024-10-30 DIAGNOSIS — I129 Hypertensive chronic kidney disease with stage 1 through stage 4 chronic kidney disease, or unspecified chronic kidney disease: Secondary | ICD-10-CM | POA: Insufficient documentation

## 2024-10-30 DIAGNOSIS — N183 Chronic kidney disease, stage 3 unspecified: Secondary | ICD-10-CM | POA: Diagnosis not present

## 2024-10-30 DIAGNOSIS — D472 Monoclonal gammopathy: Secondary | ICD-10-CM

## 2024-10-30 DIAGNOSIS — Z79899 Other long term (current) drug therapy: Secondary | ICD-10-CM | POA: Insufficient documentation

## 2024-10-30 DIAGNOSIS — C9000 Multiple myeloma not having achieved remission: Secondary | ICD-10-CM | POA: Diagnosis present

## 2024-10-30 DIAGNOSIS — Z5112 Encounter for antineoplastic immunotherapy: Secondary | ICD-10-CM | POA: Diagnosis not present

## 2024-10-30 LAB — CBC WITH DIFFERENTIAL (CANCER CENTER ONLY)
Abs Immature Granulocytes: 0.03 K/uL (ref 0.00–0.07)
Basophils Absolute: 0 K/uL (ref 0.0–0.1)
Basophils Relative: 0 %
Eosinophils Absolute: 0.1 K/uL (ref 0.0–0.5)
Eosinophils Relative: 2 %
HCT: 34.8 % — ABNORMAL LOW (ref 39.0–52.0)
Hemoglobin: 11.6 g/dL — ABNORMAL LOW (ref 13.0–17.0)
Immature Granulocytes: 0 %
Lymphocytes Relative: 29 %
Lymphs Abs: 1.9 K/uL (ref 0.7–4.0)
MCH: 31.4 pg (ref 26.0–34.0)
MCHC: 33.3 g/dL (ref 30.0–36.0)
MCV: 94.1 fL (ref 80.0–100.0)
Monocytes Absolute: 1 K/uL (ref 0.1–1.0)
Monocytes Relative: 15 %
Neutro Abs: 3.7 K/uL (ref 1.7–7.7)
Neutrophils Relative %: 54 %
Platelet Count: 184 K/uL (ref 150–400)
RBC: 3.7 MIL/uL — ABNORMAL LOW (ref 4.22–5.81)
RDW: 14.9 % (ref 11.5–15.5)
WBC Count: 6.8 K/uL (ref 4.0–10.5)
nRBC: 0 % (ref 0.0–0.2)

## 2024-10-30 LAB — CMP (CANCER CENTER ONLY)
ALT: 20 U/L (ref 0–44)
AST: 16 U/L (ref 15–41)
Albumin: 3.4 g/dL — ABNORMAL LOW (ref 3.5–5.0)
Alkaline Phosphatase: 56 U/L (ref 38–126)
Anion gap: 8 (ref 5–15)
BUN: 40 mg/dL — ABNORMAL HIGH (ref 8–23)
CO2: 18 mmol/L — ABNORMAL LOW (ref 22–32)
Calcium: 8.3 mg/dL — ABNORMAL LOW (ref 8.9–10.3)
Chloride: 103 mmol/L (ref 98–111)
Creatinine: 2.49 mg/dL — ABNORMAL HIGH (ref 0.61–1.24)
GFR, Estimated: 25 mL/min — ABNORMAL LOW (ref >60)
Glucose, Bld: 161 mg/dL — ABNORMAL HIGH (ref 70–99)
Potassium: 5.5 mmol/L — ABNORMAL HIGH (ref 3.5–5.1)
Sodium: 129 mmol/L — ABNORMAL LOW (ref 135–145)
Total Bilirubin: 0.4 mg/dL (ref 0.0–1.2)
Total Protein: 8.4 g/dL — ABNORMAL HIGH (ref 6.5–8.1)

## 2024-10-30 MED ORDER — ACETAMINOPHEN 325 MG PO TABS
650.0000 mg | ORAL_TABLET | Freq: Once | ORAL | Status: AC
  Administered 2024-10-30: 650 mg via ORAL
  Filled 2024-10-30: qty 2

## 2024-10-30 MED ORDER — DARATUMUMAB-HYALURONIDASE-FIHJ 1800-30000 MG-UT/15ML ~~LOC~~ SOLN
1800.0000 mg | Freq: Once | SUBCUTANEOUS | Status: AC
  Administered 2024-10-30: 1800 mg via SUBCUTANEOUS
  Filled 2024-10-30: qty 15

## 2024-10-30 MED ORDER — DEXAMETHASONE 6 MG PO TABS
20.0000 mg | ORAL_TABLET | Freq: Once | ORAL | Status: AC
  Administered 2024-10-30: 20 mg via ORAL
  Filled 2024-10-30: qty 2

## 2024-10-30 MED ORDER — MONTELUKAST SODIUM 10 MG PO TABS
10.0000 mg | ORAL_TABLET | Freq: Once | ORAL | Status: AC
  Administered 2024-10-30: 10 mg via ORAL
  Filled 2024-10-30: qty 1

## 2024-10-30 MED ORDER — FAMOTIDINE 20 MG PO TABS
20.0000 mg | ORAL_TABLET | Freq: Once | ORAL | Status: AC
  Administered 2024-10-30: 20 mg via ORAL
  Filled 2024-10-30: qty 1

## 2024-10-30 MED ORDER — DIPHENHYDRAMINE HCL 25 MG PO CAPS
50.0000 mg | ORAL_CAPSULE | Freq: Once | ORAL | Status: AC
  Administered 2024-10-30: 50 mg via ORAL
  Filled 2024-10-30: qty 2

## 2024-10-30 NOTE — Progress Notes (Signed)
 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 male is a here because of positive M-spike. The patient was referred by nephrologist Dr. Gearline. The patient presents to the clinic today accompanied by his wife.   He is being seen by nephrologist who pursued further work up to why his Creatinine has increased. Upon workup his M-protein was found to be 0.8g/dl.  He has had HTN and DM for many years that can affects his Kidney function and was thought to be the likely cause of his CKD.    Today his wife notes he has had muscle pain, muscle mass loss and fatigue.    He has HTN, DM, GERD, Hypothyroid, Hyperlipidemia. He was recently taken off statins, meloxicam , ASA and metformin . He takes Nexium  for her GERD and has been on for many years.    On review of symptoms, pt notes purposeful weight loss, muscle loss and muscle pain. He also has leg pain from b/l knees to anterior shin.   SUMMARY OF ONCOLOGIC HISTORY: Oncology History  Smoldering myeloma  10/10/2024 Initial Diagnosis   Smoldering myeloma   10/23/2024 -  Chemotherapy   Patient is on Treatment Plan : MYELOMA Daratumumab  SQ q28d       INTERVAL HISTORY: Troy Howard is a 83 y.o. 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   he was last seen by me on 09/26/2024; at the time he did not have any concerns and was doing well.   Today, he notes having no appetite and skipping meals. He also notes feeling bloated and lightheaded. He states that he is only drinking roughly  48oz of water daily.   He notes chronic fatigue that has worsened.   He states  that he has been experiencing some stress about managing multiple medications. He notes that he is not depressed but does note feeling distant and non-engaging.  Denies nausea, new infections, fevers/chills, bone pains, night sweats, and skin rashes.   REVIEW OF SYSTEMS:   10 Point review of systems of done and is negative except as noted above.  MEDICAL HISTORY Past Medical History:  Diagnosis Date   Arthritis    back   Cancer [facility])    prostate, sees Dr. Gretel Ferrara    Chronic kidney disease 2017   stage 3 kidney disease   Diabetes mellitus    Erectile dysfunction    GERD (gastroesophageal reflux disease)    Hyperlipidemia    Hypertension    Migraines    Peptic ulcer    Thyroid  disease     SURGICAL HISTORY Past Surgical History:  Procedure Laterality Date   APPENDECTOMY  1950   COLONOSCOPY  05/22/2021   per Dr. Aneita, benign polyps, no repeats needed   HERNIA REPAIR     umbilical    MENISCUS REPAIR Left    PROSTATE SURGERY  2009   radical prostatectomy   SPINE SURGERY     L4-L5 fusion per Dr. Oneil Carwin    TONSILLECTOMY AND ADENOIDECTOMY  1954    SOCIAL HISTORY Social History   Tobacco Use   Smoking status: Former    Current packs/day: 0.00    Average packs/day: 0.3 packs/day  for 5.0 years (1.3 ttl pk-yrs)    Types: Cigarettes    Start date: 65    Quit date: 20    Years since quitting: 58.9   Smokeless tobacco: Never  Vaping Use   Vaping status: Never Used  Substance Use Topics   Alcohol use: Not Currently    Comment: rare one glass every 6 months   Drug use: No    Social History   Social History Narrative   Not on file    SOCIAL DRIVERS OF HEALTH SDOH Screenings   Food Insecurity: No Food Insecurity (03/15/2023)  Housing: Low Risk  (03/15/2023)  Transportation Needs: No Transportation Needs (03/15/2023)  Alcohol Screen: Low Risk  (01/15/2022)  Depression (PHQ2-9): Low Risk  (10/23/2024)  Financial Resource Strain: Low Risk  (07/14/2022)   Physical Activity: Insufficiently Active (03/15/2023)  Social Connections: Moderately Isolated (03/15/2023)  Stress: Stress Concern Present (03/15/2023)  Tobacco Use: Medium Risk (09/11/2024)     FAMILY HISTORY Family History  Problem Relation Age of Onset   Hypertension Other    Cancer Other    Colon cancer Neg Hx    Colon polyps Neg Hx    Esophageal cancer Neg Hx    Rectal cancer Neg Hx    Stomach cancer Neg Hx      ALLERGIES: has no known allergies.  MEDICATIONS  Current Outpatient Medications  Medication Sig Dispense Refill   acyclovir  (ZOVIRAX ) 400 MG tablet Take 1 tablet (400 mg total) by mouth 2 (two) times daily. 60 tablet 11   aspirin-acetaminophen -caffeine  (EXCEDRIN MIGRAINE) 250-250-65 MG tablet Take 1 tablet by mouth every 8 (eight) hours as needed for migraine.     atorvastatin  (LIPITOR) 10 MG tablet Take 1 tablet (10 mg total) by mouth daily. 90 tablet 3   Blood Glucose Monitoring Suppl (ONE TOUCH ULTRA 2) w/Device KIT Use to check blood sugars once daily. 1 kit 0   canagliflozin  (INVOKANA ) 300 MG TABS tablet Take 1 tablet (300 mg total) by mouth daily before breakfast. 90 tablet 3   CHELATED ZINC PO Take 1 capsule by mouth daily.     Cholecalciferol (VITAMIN D3) 2000 units capsule Take 2,000 Units by mouth daily.      ciprofloxacin  (CIPRO ) 500 MG tablet TAKE 1 TABLET BY MOUTH TWICE A DAY 14 tablet 0   Continuous Glucose Sensor (FREESTYLE LIBRE 3 SENSOR) MISC Place 1 sensor on the skin every 14 days. Use to check glucose continuously 2 each 5   Cyanocobalamin  (VITAMIN B 12 PO) Take 2,500 mcg by mouth daily.      dexamethasone  (DECADRON ) 4 MG tablet Take 3 tablets (12 mg total) by mouth daily. Take with breakfast the morning after each treatment. 20 tablet 4   esomeprazole  (NEXIUM ) 40 MG capsule Take 1 capsule (40 mg total) by mouth daily. 90 capsule 3   FOLIC ACID PO Take 800 mcg by mouth daily.      glipiZIDE  (GLUCOTROL ) 5 MG tablet Take 1 tablet (5 mg total) by  mouth 2 (two) times daily before a meal. 180 tablet 3   Levomefolate Glucosamine (METHYLFOLATE PO) Take 1 tablet by mouth daily.     LORazepam  (ATIVAN ) 2 MG tablet Take 1 tablet (2 mg total) by mouth every 6 (six) hours as needed for anxiety. 30 tablet 2   metroNIDAZOLE  (FLAGYL ) 500 MG tablet TAKE 1 TABLET BY MOUTH THREE TIMES A DAY 21 tablet 0   olmesartan  (BENICAR ) 40 MG tablet TAKE 1 TABLET BY MOUTH EVERY DAY 90 tablet  3   Omega-3 Fatty Acids (FISH OIL) 1000 MG CAPS Take by mouth daily.      ondansetron  (ZOFRAN ) 8 MG tablet Take 1 tablet (8 mg total) by mouth every 8 (eight) hours as needed for nausea or vomiting. 30 tablet 1   OneTouch Delica Lancets 30G MISC USE ONCE A DAY WHEN CHECKING BLOOD SUGAR 100 each 3   ONETOUCH VERIO test strip USE ONCE A DAY TO TEST BLOOD SUGAR 100 strip 1   pyridOXINE (VITAMIN B-6) 100 MG tablet Take 100 mg by mouth daily.     sulfamethoxazole -trimethoprim  (BACTRIM  DS) 800-160 MG tablet TAKE 1 TABLET BY MOUTH TWICE A DAY FOR 10 DAYS 20 tablet 0   SUMAtriptan  (IMITREX ) 100 MG tablet TAKE 1 TABLET (100 MG TOTAL) BY MOUTH AS NEEDED FOR MIGRAINE. MAY REPEAT IN 2 HOURS IF HEADACHE PERSISTS OR RECURS. 9 tablet 6   SYNTHROID  137 MCG tablet TAKE 1 TABLET BY MOUTH DAILY BEFORE BREAKFAST. 90 tablet 3   traZODone  (DESYREL ) 100 MG tablet TAKE 1 TABLET BY MOUTH EVERYDAY AT BEDTIME 90 tablet 3   No current facility-administered medications for this visit.    PHYSICAL EXAMINATION: ECOG PERFORMANCE STATUS: 1 - Symptomatic but completely ambulatory VITALS: Vitals:   10/30/24 1201  BP: (!) 100/55  Pulse: 76  Resp: 20  Temp: (!) 97.5 F (36.4 C)  SpO2: 98%   Filed Weights   10/30/24 1201  Weight: 193 lb (87.5 kg)   Body mass index is 26.92 kg/m.  GENERAL: alert, in no acute distress and comfortable SKIN: no acute rashes, no significant lesions EYES: conjunctiva are pink and non-injected, sclera anicteric OROPHARYNX: MMM, no exudates, no oropharyngeal erythema or  ulceration NECK: supple, no JVD LYMPH:  no palpable lymphadenopathy in the cervical, axillary or inguinal regions LUNGS: clear to auscultation b/l with normal respiratory effort HEART: regular rate & rhythm ABDOMEN:  normoactive bowel sounds , non tender, not distended, no hepatosplenomegaly Extremity: no pedal edema PSYCH: alert & oriented x 3 with fluent speech NEURO: no focal motor/sensory deficits  LABORATORY DATA:   I have reviewed the data as listed     Latest Ref Rng & Units 10/30/2024   11:34 AM 10/23/2024    9:41 AM 08/15/2024    9:00 AM  CBC EXTENDED  WBC 4.0 - 10.5 K/uL 6.8  3.8  5.0   RBC 4.22 - 5.81 MIL/uL 3.70  3.58  3.91   Hemoglobin 13.0 - 17.0 g/dL 88.3  88.7  87.8   HCT 39.0 - 52.0 % 34.8  33.2  36.5   Platelets 150 - 400 K/uL 184  191  202   NEUT# 1.7 - 7.7 K/uL 3.7  1.5  1.8   Lymph# 0.7 - 4.0 K/uL 1.9  1.7  2.5         Latest Ref Rng & Units 10/30/2024   11:40 AM 08/15/2024    9:00 AM 04/12/2024    7:52 AM  CMP  Glucose 70 - 99 mg/dL 838  82  77   BUN 8 - 23 mg/dL 40  43  30   Creatinine 0.61 - 1.24 mg/dL 7.50  7.75  7.99   Sodium 135 - 145 mmol/L 129  135  135   Potassium 3.5 - 5.1 mmol/L 5.5  4.6  4.5   Chloride 98 - 111 mmol/L 103  107  108   CO2 22 - 32 mmol/L 18  23  22    Calcium  8.9 - 10.3 mg/dL 8.3  9.1  8.6   Total Protein 6.5 - 8.1 g/dL 8.4  9.5  9.2   Total Bilirubin 0.0 - 1.2 mg/dL 0.4  0.6  0.3   Alkaline Phos 38 - 126 U/L 56  79  56   AST 15 - 41 U/L 16  11  11    ALT 0 - 44 U/L 20  11  9      MULTIPLE MYELOMA & KAPPA/LAMBDA LIGHT CHAINS 11/2022 - 07/2024  IFE Immunofixation shows IgG monoclonal protein with lambda light chain specificity.       Surgical Pathology 10/12/2023  CASE: [id number] PATIENT: Troy Howard Bone Marrow Report   Clinical History: Smoldering Myleoma  DIAGNOSIS:  BONE MARROW, ASPIRATE, CLOT, CORE: -Hypercellular bone marrow with plasma cell neoplasm -See comment  PERIPHERAL BLOOD: -Macrocytic  anemia -Leukopenia  COMMENT:  The bone marrow is hypercellular for age with increased number of plasma cells representing 23% of all cells in the aspirate associated with interstitial infiltrates and numerous variably sized clusters in the clot and biopsy sections.  The plasma cells display lambda light chain restriction consistent with plasma cell neoplasm.  The background shows trilineage hematopoiesis with generally nonspecific changes particularly involving megakaryocytes.  Correlation with cytogenetic and FISH studies is recommended.  MICROSCOPIC DESCRIPTION:  PERIPHERAL BLOOD SMEAR: The red blood cells display mild anisopoikilocytosis with mild polychromasia.  The white blood cells are decreased number but with no significant morphologic abnormalities.  The platelets are normal in number.  BONE MARROW ASPIRATE: Bone marrow particles present Erythroid precursors: Orderly and progressive maturation for the most part.  Occasional late precursors display nuclear cytoplasmic dyssynchrony. Granulocytic precursors: Orderly and progressive maturation Megakaryocytes: Abundant with scattered large, atypical forms Lymphocytes/plasma cells: The plasma cells are increased in number representing 23% of all cells but with lack of large aggregates or sheets.  Some of the plasma cells display atypical features with cytomegaly and/or small nucleoli.  Large lymphoid aggregates are not seen.  TOUCH PREPARATIONS: A mixture of cell types present including increased number of plasma cells  CLOT AND BIOPSY: The sections show cellularity ranging from 40 to 70% with interstitial infiltrates and numerous variably sized clusters of plasma cells in a background of trilineage hematopoiesis.  Significant lymphoid aggregates are not seen.  Immunohistochemical stain for CD138 and in situ hybridization for kappa and lambda were performed on blocks B1 and C1 with appropriate controls.  CD138 highlights  the plasma cell component which displays lambda light chain restriction.  IRON STAIN: Iron stains are performed on a bone marrow aspirate or touch imprint smear and section of clot. The controls stained appropriately.       Storage Iron: Present      Ring Sideroblasts: Absent  ADDITIONAL DATA/TESTING: The bone marrow specimen was sent for cytogenetic analysis and FISH for multiple myeloma and a separate report will follow.  CELL COUNT DATA:  Bone Marrow count performed on 500 cells shows: Blasts:   0%   Myeloid:  41% Promyelocytes: 1%   Erythroid:     26% Myelocytes:    5%   Lymphocytes:   8% Metamyelocytes:     3%   Plasma cells:  23% Bands:    9% Neutrophils:   20%  M:E ratio:     1.6 Eosinophils:   3% Basophils:     0% Monocytes:     2%  Lab Data: CBC performed on 10/12/2023 shows: WBC: 3.6 k/uL  Neutrophils:   47% Hgb: 11.5 g/dL Lymphocytes:   60% HCT: 37.1 %  Monocytes:     13% MCV: 101.9 fL  Eosinophils:   1% RDW: 14.4 %    Basophils:     0% PLT: 191 k/uL  GROSS DESCRIPTION:  A.  BM-aspirate smear  B.  Received in B-plus fixative is a 1.2 x 0.8 x 0.2 cm aggregate of tissue.  Submitted entirely in B1.  C.  Received in B-plus fixative are 4 cores of firm bone ranging from 0.7-1.1 cm in length and each measuring 0.2 cm in diameter.  Submitted entirely in C1, following decalcification in Immunocal. (KW, 10/12/2023)     10/12/2023    10/12/2023    Surgical Pathology 03/27/2021  CASE: [id number]  PATIENT: Troy Howard  Bone Marrow Report   Clinical History: Smoldering Myleoma   DIAGNOSIS:   BONE MARROW, ASPIRATE, CLOT, CORE:  -Hypercellular bone marrow with plasma cell neoplasm  -See comment   PERIPHERAL BLOOD:  -Macrocytic anemia  -Leukopenia   COMMENT:   The bone marrow is hypercellular for age with increased number of plasma  cells representing 23% of all cells in the aspirate associated with  interstitial infiltrates and numerous  variably sized clusters in the  clot and biopsy sections.  The plasma cells display lambda light chain  restriction consistent with plasma cell neoplasm.  The background shows  trilineage hematopoiesis with generally nonspecific changes particularly  involving megakaryocytes.  Correlation with cytogenetic and FISH studies  is recommended.     03/27/2021 Molecular Pathology     03/27/2021 Cytogenetics     RADIOGRAPHIC STUDIES: I have personally reviewed the radiological images as listed and agreed with the findings in the report. No results found.  ASSESSMENT & PLAN:  83 y.o. male with  1.  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    2. 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 Gearline  -mild non nephrotic proteinuria on 24h UPEP-Continue follow up with Dr Gearline for CKD     3. Vitamin B12 deficient  Antibody testing done - no evidence of pernicious anemia. No evidence of pernicious anemia based on neg IF and parietal cell ab -continue B12 replacement per PCP   4.  Patient Active Problem List   Diagnosis Date Noted   Smoldering myeloma 10/10/2024   CKD (chronic kidney disease) stage 3, GFR 30-59 ml/min (HCC) 11/02/2023   Insomnia 03/16/2023   COVID-19 virus infection 12/29/2021   Dyslipidemia 02/22/2018   Hypothyroidism 02/22/2018   Diabetes mellitus without complication (HCC) 08/14/2014   PROSTATE CANCER, HX OF 11/13/2010   GERD 01/17/2008   Acute peptic ulcer with hemorrhage 01/17/2008   ERECTILE DYSFUNCTION 11/23/2007   HYPERTENSION, BENIGN 11/23/2007   PROSTATE SPECIFIC ANTIGEN, ELEVATED 11/23/2007   Migraine headache 06/13/2007   PLAN: - Discussed lab results on 10/30/2024 in detail with  patient: CBCs stable hgb 11.6 with normal WBC counts and platelets. CMP sodium of 129 with creatinine up to 2.49 and K of 5.5, HCO3 Encourgaed patient to drink atleast 64oz of water daily and follow low potassium diet. Will start on sodium bicarb 650mg  po bid He will need to f/u with PCP/Nephrology for continue mx of CKD and adjustment of ACEI as needed.  Please schedule next 2 months of treatment per integrated scheduling MD  visit in 2 weeks  FOLLOW-UP in 2 weeks for labs and follow-up with Dr. Onesimo.  The total time spent in the appointment was 32 minutes* .  All of the patient's questions were answered and the patient knows to call the clinic with any problems, questions, or concerns.  Emaline Onesimo MD MS AAHIVMS [facility] Hematology/Oncology Physician [facility]  *Total Encounter Time as defined by the Centers for Medicare and Medicaid Services includes, in addition to the face-to-face time of a patient visit (documented in the note above) non-face-to-face time: obtaining and reviewing outside history, ordering and reviewing medications, tests or procedures, care coordination (communications with other health care professionals or caregivers) and documentation in the medical record.  I, Marijo Sharps, acting as a neurosurgeon for Emaline Onesimo, MD.,have documented all relevant documentation on the behalf of Emaline Onesimo, MD,as directed by  Emaline Onesimo, MD while in the presence of Emaline Onesimo, MD.  I have reviewed the above documentation for accuracy and completeness, and I agree with the above.  Daneen Volcy, MD

## 2024-10-30 NOTE — Patient Instructions (Addendum)
 [facility] - A DEPT OF [facility]  Discharge Instructions: Thank you for choosing [facility] to provide your oncology and hematology care.   If you have a lab appointment with the Cancer Center, please go directly to the Cancer Center and check in at the registration area.   Wear comfortable clothing and clothing appropriate for easy access to any Portacath or PICC line.   We strive to give you quality time with your provider. You may need to reschedule your appointment if you arrive late (15 or more minutes).  Arriving late affects you and other patients whose appointments are after yours.  Also, if you miss three or more appointments without notifying the office, you may be dismissed from the clinic at the provider's discretion.      For prescription refill requests, have your pharmacy contact our office and allow 72 hours for refills to be completed.    Today you received the following chemotherapy and/or immunotherapy agents Daratumumab - Hyaluronidase       To help prevent nausea and vomiting after your treatment, we encourage you to take your nausea medication as directed.  BELOW ARE SYMPTOMS THAT SHOULD BE REPORTED IMMEDIATELY: *FEVER GREATER THAN 100.4 F (38 C) OR HIGHER *CHILLS OR SWEATING *NAUSEA AND VOMITING THAT IS NOT CONTROLLED WITH YOUR NAUSEA MEDICATION *UNUSUAL SHORTNESS OF BREATH *UNUSUAL BRUISING OR BLEEDING *URINARY PROBLEMS (pain or burning when urinating, or frequent urination) *BOWEL PROBLEMS (unusual diarrhea, constipation, pain near the anus) TENDERNESS IN MOUTH AND THROAT WITH OR WITHOUT PRESENCE OF ULCERS (sore throat, sores in mouth, or a toothache) UNUSUAL RASH, SWELLING OR PAIN  UNUSUAL VAGINAL DISCHARGE OR ITCHING   Items with * indicate a potential emergency and should be followed up as soon as possible or go to the Emergency Department if any problems should occur.  Please show the CHEMOTHERAPY ALERT CARD  or IMMUNOTHERAPY ALERT CARD at check-in to the Emergency Department and triage nurse.  Should you have questions after your visit or need to cancel or reschedule your appointment, please contact [facility]  Dept: [phone]  and follow the prompts.  Office hours are 8:00 a.m. to 4:30 p.m. Monday - Friday. Please note that voicemails left after 4:00 p.m. may not be returned until the following business day.  We are closed weekends and major holidays. You have access to a nurse at all times for urgent questions. Please call the main number to the clinic Dept: [phone] and follow the prompts.   For any non-urgent questions, you may also contact your provider using MyChart. We now offer e-Visits for anyone 48 and older to request care online for non-urgent symptoms. For details visit [email or web address].   Also download the MyChart app! Go to the app store, search MyChart, open the app, select [place], and log in with your MyChart username and password.

## 2024-10-30 NOTE — Progress Notes (Signed)
 Patient observed for 1 hour following second Darzalex  Faspro injection.  Tolerated treatment well without incident.  VSS at discharge.  Ambulated to lobby.

## 2024-11-02 ENCOUNTER — Other Ambulatory Visit: Payer: Self-pay | Admitting: Family Medicine

## 2024-11-06 ENCOUNTER — Encounter: Payer: Self-pay | Admitting: Hematology

## 2024-11-06 ENCOUNTER — Inpatient Hospital Stay

## 2024-11-06 VITALS — BP 130/63 | HR 72 | Temp 98.1°F | Resp 19

## 2024-11-06 DIAGNOSIS — Z5112 Encounter for antineoplastic immunotherapy: Secondary | ICD-10-CM | POA: Diagnosis not present

## 2024-11-06 DIAGNOSIS — D472 Monoclonal gammopathy: Secondary | ICD-10-CM

## 2024-11-06 LAB — CMP (CANCER CENTER ONLY)
ALT: 17 U/L (ref 0–44)
AST: 13 U/L — ABNORMAL LOW (ref 15–41)
Albumin: 3.3 g/dL — ABNORMAL LOW (ref 3.5–5.0)
Alkaline Phosphatase: 66 U/L (ref 38–126)
Anion gap: 9 (ref 5–15)
BUN: 57 mg/dL — ABNORMAL HIGH (ref 8–23)
CO2: 18 mmol/L — ABNORMAL LOW (ref 22–32)
Calcium: 8.5 mg/dL — ABNORMAL LOW (ref 8.9–10.3)
Chloride: 105 mmol/L (ref 98–111)
Creatinine: 2.08 mg/dL — ABNORMAL HIGH (ref 0.61–1.24)
GFR, Estimated: 31 mL/min — ABNORMAL LOW (ref >60)
Glucose, Bld: 133 mg/dL — ABNORMAL HIGH (ref 70–99)
Potassium: 4.8 mmol/L (ref 3.5–5.1)
Sodium: 132 mmol/L — ABNORMAL LOW (ref 135–145)
Total Bilirubin: 0.4 mg/dL (ref 0.0–1.2)
Total Protein: 8.2 g/dL — ABNORMAL HIGH (ref 6.5–8.1)

## 2024-11-06 LAB — CBC WITH DIFFERENTIAL (CANCER CENTER ONLY)
Abs Immature Granulocytes: 0.02 K/uL (ref 0.00–0.07)
Basophils Absolute: 0 K/uL (ref 0.0–0.1)
Basophils Relative: 0 %
Eosinophils Absolute: 0.2 K/uL (ref 0.0–0.5)
Eosinophils Relative: 4 %
HCT: 34.7 % — ABNORMAL LOW (ref 39.0–52.0)
Hemoglobin: 11.6 g/dL — ABNORMAL LOW (ref 13.0–17.0)
Immature Granulocytes: 0 %
Lymphocytes Relative: 33 %
Lymphs Abs: 1.9 K/uL (ref 0.7–4.0)
MCH: 31.8 pg (ref 26.0–34.0)
MCHC: 33.4 g/dL (ref 30.0–36.0)
MCV: 95.1 fL (ref 80.0–100.0)
Monocytes Absolute: 0.5 K/uL (ref 0.1–1.0)
Monocytes Relative: 9 %
Neutro Abs: 3.1 K/uL (ref 1.7–7.7)
Neutrophils Relative %: 54 %
Platelet Count: 184 K/uL (ref 150–400)
RBC: 3.65 MIL/uL — ABNORMAL LOW (ref 4.22–5.81)
RDW: 14.5 % (ref 11.5–15.5)
WBC Count: 5.8 K/uL (ref 4.0–10.5)
nRBC: 0 % (ref 0.0–0.2)

## 2024-11-06 MED ORDER — DIPHENHYDRAMINE HCL 25 MG PO CAPS
50.0000 mg | ORAL_CAPSULE | Freq: Once | ORAL | Status: AC
  Administered 2024-11-06: 50 mg via ORAL
  Filled 2024-11-06: qty 2

## 2024-11-06 MED ORDER — DARATUMUMAB-HYALURONIDASE-FIHJ 1800-30000 MG-UT/15ML ~~LOC~~ SOLN
1800.0000 mg | Freq: Once | SUBCUTANEOUS | Status: AC
  Administered 2024-11-06: 1800 mg via SUBCUTANEOUS
  Filled 2024-11-06: qty 15

## 2024-11-06 MED ORDER — FAMOTIDINE 20 MG PO TABS
20.0000 mg | ORAL_TABLET | Freq: Once | ORAL | Status: AC
  Administered 2024-11-06: 20 mg via ORAL
  Filled 2024-11-06: qty 1

## 2024-11-06 MED ORDER — ACETAMINOPHEN 325 MG PO TABS
650.0000 mg | ORAL_TABLET | Freq: Once | ORAL | Status: AC
  Administered 2024-11-06: 650 mg via ORAL
  Filled 2024-11-06: qty 2

## 2024-11-06 MED ORDER — DEXAMETHASONE 6 MG PO TABS
20.0000 mg | ORAL_TABLET | Freq: Once | ORAL | Status: AC
  Administered 2024-11-06: 20 mg via ORAL
  Filled 2024-11-06: qty 2

## 2024-11-06 MED ORDER — SODIUM BICARBONATE 650 MG PO TABS: 650.0000 mg | ORAL_TABLET | Freq: Two times a day (BID) | ORAL | 0 refills | Status: DC

## 2024-11-06 MED ORDER — MONTELUKAST SODIUM 10 MG PO TABS
10.0000 mg | ORAL_TABLET | Freq: Once | ORAL | Status: AC
  Administered 2024-11-06: 10 mg via ORAL
  Filled 2024-11-06: qty 1

## 2024-11-06 NOTE — Progress Notes (Signed)
 Contacted pt per Dr Onesimo to let pt know: sodium is low and postassium is elevated to 5.5 likely due to dehydration.  Remind patient to drink atleast 64oz of water daily and follow low potassium diet.  Will start on sodium bicarb 650mg  po bid  He will need to f/u with PCP/Nephrology for continue management of CKD as needed.  Pt acknowledged information and verbalized understanding.

## 2024-11-06 NOTE — Patient Instructions (Signed)
 [facility] - A DEPT OF [facility]  Discharge Instructions: Thank you for choosing [facility] to provide your oncology and hematology care.   If you have a lab appointment with the Cancer Center, please go directly to the Cancer Center and check in at the registration area.   Wear comfortable clothing and clothing appropriate for easy access to any Portacath or PICC line.   We strive to give you quality time with your provider. You may need to reschedule your appointment if you arrive late (15 or more minutes).  Arriving late affects you and other patients whose appointments are after yours.  Also, if you miss three or more appointments without notifying the office, you may be dismissed from the clinic at the provider's discretion.      For prescription refill requests, have your pharmacy contact our office and allow 72 hours for refills to be completed.    Today you received the following chemotherapy and/or immunotherapy agents: daratumumab-hyaluronidase-fihj (DARZALEX FASPRO       To help prevent nausea and vomiting after your treatment, we encourage you to take your nausea medication as directed.  BELOW ARE SYMPTOMS THAT SHOULD BE REPORTED IMMEDIATELY: *FEVER GREATER THAN 100.4 F (38 C) OR HIGHER *CHILLS OR SWEATING *NAUSEA AND VOMITING THAT IS NOT CONTROLLED WITH YOUR NAUSEA MEDICATION *UNUSUAL SHORTNESS OF BREATH *UNUSUAL BRUISING OR BLEEDING *URINARY PROBLEMS (pain or burning when urinating, or frequent urination) *BOWEL PROBLEMS (unusual diarrhea, constipation, pain near the anus) TENDERNESS IN MOUTH AND THROAT WITH OR WITHOUT PRESENCE OF ULCERS (sore throat, sores in mouth, or a toothache) UNUSUAL RASH, SWELLING OR PAIN  UNUSUAL VAGINAL DISCHARGE OR ITCHING   Items with * indicate a potential emergency and should be followed up as soon as possible or go to the Emergency Department if any problems should occur.  Please show the  CHEMOTHERAPY ALERT CARD or IMMUNOTHERAPY ALERT CARD at check-in to the Emergency Department and triage nurse.  Should you have questions after your visit or need to cancel or reschedule your appointment, please contact [facility]  Dept: [phone]  and follow the prompts.  Office hours are 8:00 a.m. to 4:30 p.m. Monday - Friday. Please note that voicemails left after 4:00 p.m. may not be returned until the following business day.  We are closed weekends and major holidays. You have access to a nurse at all times for urgent questions. Please call the main number to the clinic Dept: [phone] and follow the prompts.   For any non-urgent questions, you may also contact your provider using MyChart. We now offer e-Visits for anyone 63 and older to request care online for non-urgent symptoms. For details visit [email or web address].   Also download the MyChart app! Go to the app store, search "MyChart", open the app, select [place], and log in with your MyChart username and password.

## 2024-11-07 ENCOUNTER — Encounter: Payer: Self-pay | Admitting: Hematology

## 2024-11-07 NOTE — Progress Notes (Signed)
 Contacted pt regarding MyChart message. Pt going for an evaluation for possible cataract surgery tomorrow. Informed pt per Dr Onesimo pt may want to wait a little while prior to having this done unless it is necessary to be done right away. Pt stated there is not rush. Pt will discuss further with Dr Onesimo at next appointment.

## 2024-11-08 ENCOUNTER — Ambulatory Visit: Payer: Self-pay

## 2024-11-08 NOTE — Telephone Encounter (Signed)
 FYI Only or Action Required?: Action required by provider: clinical question for provider and update on patient condition.  Patient was last seen in primary care on 09/11/2024 by Troy Howard LABOR, MD.  Called Nurse Triage reporting Hyperglycemia.    Triage Disposition: Call PCP Now  Patient/caregiver understands and will follow disposition?: Yes       Copied from CRM #[id number]. Topic: Clinical - Red Word Triage >> Nov 08, 2024 11:17 AM Macario HERO wrote: Red Word that prompted transfer to Nurse Triage: Patient spouse and daughter [name] called with a few questions regarding patient care, his glucose went up to 350 last night. Currently on going cancer treatment so is concerned. Reason for Disposition  [1] Caller requests to speak ONLY to PCP AND [2] URGENT question  Answer Assessment - Initial Assessment Questions 1. REASON FOR CALL or QUESTION: What is your reason for calling today? or How can I best    Patients daughter called in stating her father is undergoing CA treatment currently. The treatment and steroids is causing his Blood Sugar to increase. Last night it got over 350, then 250. They want to know if the glipizide  can be increase or if he needs insulin through out the day. Currently his blood sugar is 92. But they want to know how to proceed to manage it. Please contact the patients wife with further instructions. Please advise.  Protocols used: PCP Call - No Triage-A-AH

## 2024-11-09 ENCOUNTER — Other Ambulatory Visit: Payer: Self-pay | Admitting: Family Medicine

## 2024-11-09 NOTE — Telephone Encounter (Signed)
 Yes he can increase the Glipizide  to 2 pills (10 mg) BID until he is off the Prednisone

## 2024-11-10 ENCOUNTER — Other Ambulatory Visit: Payer: Self-pay | Admitting: Family Medicine

## 2024-11-10 NOTE — Telephone Encounter (Signed)
 Follow up with pt. Inform pt of provider's message below. Pt verbalized understanding. Has no further question at this time.

## 2024-11-13 NOTE — Progress Notes (Unsigned)
 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 male who is here today for continued evaluation and management of smoldering multiple myeloma. He is unaccompanied for this visit.   Mr. Siebenaler reports he is tolerating treatment well with no significant toxicities. He has noticed insomnia the night after treatment secondary to steroid use. His energy and appetite are unchanged. He denies nausea, vomiting or bowel habit changes. He denies easy bruising or overt signs of bleeding. He denies fevers, chills, sweats, shortness of breath, chest pain or cough. Rest of the ROS is below.  REVIEW OF SYSTEMS:   10 Point review of systems of done and is negative except as noted above.  MEDICAL HISTORY Past Medical History:  Diagnosis Date   Arthritis    back   Cancer [facility])    prostate, sees Dr. Gretel Ferrara    Chronic kidney disease 2017   stage 3 kidney disease   Diabetes mellitus    Erectile dysfunction    GERD (gastroesophageal reflux disease)    Hyperlipidemia    Hypertension    Migraines    Peptic ulcer    Thyroid  disease     SURGICAL HISTORY Past Surgical History:  Procedure Laterality Date   APPENDECTOMY  1950   COLONOSCOPY  05/22/2021   per Dr. Aneita, benign polyps, no repeats needed   HERNIA REPAIR     umbilical    MENISCUS REPAIR Left    PROSTATE SURGERY  2009   radical prostatectomy   SPINE SURGERY     L4-L5 fusion per Dr. Oneil Carwin    TONSILLECTOMY AND ADENOIDECTOMY  1954    SOCIAL HISTORY Social History   Tobacco Use   Smoking status: Former    Current packs/day: 0.00    Average packs/day: 0.3 packs/day for 5.0 years (1.3 ttl pk-yrs)    Types:  Cigarettes    Start date: 52    Quit date: 62    Years since quitting: 58.9   Smokeless tobacco: Never  Vaping Use   Vaping status: Never Used  Substance Use Topics   Alcohol use: Not Currently    Comment: rare one glass every 6 months   Drug use: No    Social History   Social History Narrative   Not on file    SOCIAL DRIVERS OF HEALTH SDOH Screenings   Food Insecurity: No Food Insecurity (03/15/2023)  Housing: Low Risk (03/15/2023)  Transportation Needs: No Transportation Needs (03/15/2023)  Alcohol Screen: Low Risk (01/15/2022)  Depression (PHQ2-9): Low Risk (10/23/2024)  Financial Resource Strain: Low Risk (07/14/2022)  Physical Activity: Insufficiently Active (03/15/2023)  Social Connections: Moderately Isolated (03/15/2023)  Stress: Stress Concern Present (03/15/2023)  Tobacco Use: Medium Risk (09/11/2024)     FAMILY HISTORY Family History  Problem Relation Age of Onset   Hypertension Other    Cancer Other    Colon cancer Neg Hx    Colon polyps Neg Hx    Esophageal cancer Neg Hx    Rectal cancer Neg Hx    Stomach cancer Neg Hx      ALLERGIES: has no known allergies.  MEDICATIONS  Current Outpatient Medications  Medication Sig Dispense Refill   acyclovir  (ZOVIRAX ) 400 MG tablet Take 1 tablet (400 mg total)  by mouth 2 (two) times daily. 60 tablet 11   aspirin-acetaminophen -caffeine  (EXCEDRIN MIGRAINE) 250-250-65 MG tablet Take 1 tablet by mouth every 8 (eight) hours as needed for migraine.     atorvastatin  (LIPITOR) 10 MG tablet Take 1 tablet (10 mg total) by mouth daily. 90 tablet 3   Blood Glucose Monitoring Suppl (ONE TOUCH ULTRA 2) w/Device KIT Use to check blood sugars once daily. 1 kit 0   canagliflozin  (INVOKANA ) 300 MG TABS tablet TAKE 1 TABLET (300 MG TOTAL) BY MOUTH DAILY BEFORE BREAKFAST. 90 tablet 0   Cholecalciferol (VITAMIN D3) 2000 units capsule Take 2,000 Units by mouth daily.      Continuous Glucose Sensor (FREESTYLE LIBRE 3 PLUS SENSOR) MISC  PLACE 1 SENSOR ON THE SKIN EVERY 14 DAYS. USE TO CHECK GLUCOSE CONTINUOUSLY 2 each 5   Cyanocobalamin  (VITAMIN B 12 PO) Take 2,500 mcg by mouth daily.      dexamethasone  (DECADRON ) 4 MG tablet Take 3 tablets (12 mg total) by mouth daily. Take with breakfast the morning after each treatment. 20 tablet 4   glipiZIDE  (GLUCOTROL ) 5 MG tablet Take 1 tablet (5 mg total) by mouth 2 (two) times daily before a meal. 180 tablet 3   LORazepam  (ATIVAN ) 2 MG tablet Take 1 tablet (2 mg total) by mouth every 6 (six) hours as needed for anxiety. 30 tablet 2   olmesartan  (BENICAR ) 40 MG tablet TAKE 1 TABLET BY MOUTH EVERY DAY 90 tablet 3   Omega-3 Fatty Acids (FISH OIL) 1000 MG CAPS Take by mouth daily.      ondansetron  (ZOFRAN ) 8 MG tablet Take 1 tablet (8 mg total) by mouth every 8 (eight) hours as needed for nausea or vomiting. 30 tablet 1   OneTouch Delica Lancets 30G MISC USE ONCE A DAY WHEN CHECKING BLOOD SUGAR 100 each 3   ONETOUCH VERIO test strip USE ONCE A DAY TO TEST BLOOD SUGAR 100 strip 1   pyridOXINE (VITAMIN B-6) 100 MG tablet Take 100 mg by mouth daily.     sodium bicarbonate  650 MG tablet Take 1 tablet (650 mg total) by mouth 2 (two) times daily. 60 tablet 0   SUMAtriptan  (IMITREX ) 100 MG tablet TAKE 1 TABLET (100 MG TOTAL) BY MOUTH AS NEEDED FOR MIGRAINE. MAY REPEAT IN 2 HOURS IF HEADACHE PERSISTS OR RECURS. 9 tablet 6   SYNTHROID  137 MCG tablet TAKE 1 TABLET BY MOUTH DAILY BEFORE BREAKFAST. 90 tablet 3   traZODone  (DESYREL ) 100 MG tablet TAKE 1 TABLET BY MOUTH EVERYDAY AT BEDTIME 90 tablet 3   CHELATED ZINC PO Take 1 capsule by mouth daily. (Patient not taking: Reported on 11/14/2024)     ciprofloxacin  (CIPRO ) 500 MG tablet TAKE 1 TABLET BY MOUTH TWICE A DAY (Patient not taking: Reported on 11/14/2024) 14 tablet 0   esomeprazole  (NEXIUM ) 40 MG capsule Take 1 capsule (40 mg total) by mouth daily. (Patient not taking: Reported on 11/14/2024) 90 capsule 3   FOLIC ACID PO Take 800 mcg by mouth daily.   (Patient not taking: Reported on 11/14/2024)     Levomefolate Glucosamine (METHYLFOLATE PO) Take 1 tablet by mouth daily. (Patient not taking: Reported on 11/14/2024)     metroNIDAZOLE  (FLAGYL ) 500 MG tablet TAKE 1 TABLET BY MOUTH THREE TIMES A DAY (Patient not taking: Reported on 11/14/2024) 21 tablet 0   No current facility-administered medications for this visit.    PHYSICAL EXAMINATION: ECOG PERFORMANCE STATUS: 1 - Symptomatic but completely ambulatory VITALS: Vitals:   11/14/24 1109  BP: 111/65  Pulse: 65  Resp: 16  Temp: 97.7 F (36.5 C)  SpO2: 99%    Filed Weights   11/14/24 1109  Weight: 189 lb (85.7 kg)    Body mass index is 26.36 kg/m.  GENERAL: alert, in no acute distress and comfortable SKIN: no acute rashes, no significant lesions EYES: conjunctiva are pink and non-injected, sclera anicteric LUNGS: clear to auscultation b/l with normal respiratory effort HEART: regular rate & rhythm Extremity: no pedal edema PSYCH: alert & oriented x 3 with fluent speech NEURO: no focal motor/sensory deficits  LABORATORY DATA:   I have reviewed the data as listed     Latest Ref Rng & Units 11/14/2024   10:39 AM 11/06/2024    1:58 PM 10/30/2024   11:34 AM  CBC EXTENDED  WBC 4.0 - 10.5 K/uL 5.3  5.8  6.8   RBC 4.22 - 5.81 MIL/uL 3.52  3.65  3.70   Hemoglobin 13.0 - 17.0 g/dL 88.8  88.3  88.3   HCT 39.0 - 52.0 % 32.5  34.7  34.8   Platelets 150 - 400 K/uL 175  184  184   NEUT# 1.7 - 7.7 K/uL 2.7  3.1  3.7   Lymph# 0.7 - 4.0 K/uL 1.9  1.9  1.9         Latest Ref Rng & Units 11/06/2024    1:58 PM 10/30/2024   11:40 AM 08/15/2024    9:00 AM  CMP  Glucose 70 - 99 mg/dL 866  838  82   BUN 8 - 23 mg/dL 57  40  43   Creatinine 0.61 - 1.24 mg/dL 7.91  7.50  7.75   Sodium 135 - 145 mmol/L 132  129  135   Potassium 3.5 - 5.1 mmol/L 4.8  5.5  4.6   Chloride 98 - 111 mmol/L 105  103  107   CO2 22 - 32 mmol/L 18  18  23    Calcium  8.9 - 10.3 mg/dL 8.5  8.3  9.1   Total  Protein 6.5 - 8.1 g/dL 8.2  8.4  9.5   Total Bilirubin 0.0 - 1.2 mg/dL 0.4  0.4  0.6   Alkaline Phos 38 - 126 U/L 66  56  79   AST 15 - 41 U/L 13  16  11    ALT 0 - 44 U/L 17  20  11       RADIOGRAPHIC STUDIES: I have personally reviewed the radiological images as listed and agreed with the findings in the report. No results found.  ASSESSMENT:  Troy Howard is a 83 y.o. who presents to the clinic for continued management of smoldering multiple myeloma.   1.  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 Cytogenetics shows normal male karyotype. 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 --Started daratumumab  therapy on 10/23/2024.    2. 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 Gearline  --mild non nephrotic proteinuria on 24h UPEP-Continue follow up with Dr Gearline for CKD     3. Vitamin B12 deficient  --Antibody testing done - no evidence of pernicious anemia. --No evidence of pernicious anemia based on neg IF and parietal cell ab --continue B12 replacement per PCP  PLAN: --Due for cycle 1, day 22 of daratumumab  today --Labs today show WBC 5.3, Hgb 11.1, Plt 175. Creatinine stable at 2.13.  --Myeloma labs prior to starting  treatment showed M protein measuring 3.0. Lambda light chain was 130.1.  --Proceed with treatment today without any dose modifications. --Starting next treatment, we will give dexamethasone  at home to minimize insomnia.  --RTC in 2 weeks for next toxicity check   All of the patient's questions were answered and the patient knows to call the clinic with any problems, questions, or concerns.   I have spent a total of 30 minutes minutes of face-to-face and non-face-to-face time, preparing to see the patient,performing a medically appropriate examination, counseling and educating the patient, ordering medications, documenting clinical  information in the electronic health record, independently interpreting results and communicating results to the patient, and care coordination.   Johnston Police PA-C Dept of Hematology and Oncology [facility] at [facility] Phone: [phone]

## 2024-11-14 ENCOUNTER — Other Ambulatory Visit: Payer: Self-pay | Admitting: Physician Assistant

## 2024-11-14 ENCOUNTER — Other Ambulatory Visit: Payer: Self-pay

## 2024-11-14 ENCOUNTER — Inpatient Hospital Stay

## 2024-11-14 ENCOUNTER — Inpatient Hospital Stay: Admitting: Physician Assistant

## 2024-11-14 VITALS — BP 111/65 | HR 65 | Temp 97.7°F | Resp 16 | Ht 71.0 in | Wt 189.0 lb

## 2024-11-14 DIAGNOSIS — D472 Monoclonal gammopathy: Secondary | ICD-10-CM

## 2024-11-14 DIAGNOSIS — Z5112 Encounter for antineoplastic immunotherapy: Secondary | ICD-10-CM

## 2024-11-14 LAB — CMP (CANCER CENTER ONLY)
ALT: 14 U/L (ref 0–44)
AST: 12 U/L — ABNORMAL LOW (ref 15–41)
Albumin: 3.5 g/dL (ref 3.5–5.0)
Alkaline Phosphatase: 71 U/L (ref 38–126)
Anion gap: 9 (ref 5–15)
BUN: 48 mg/dL — ABNORMAL HIGH (ref 8–23)
CO2: 21 mmol/L — ABNORMAL LOW (ref 22–32)
Calcium: 8.8 mg/dL — ABNORMAL LOW (ref 8.9–10.3)
Chloride: 105 mmol/L (ref 98–111)
Creatinine: 2.13 mg/dL — ABNORMAL HIGH (ref 0.61–1.24)
GFR, Estimated: 30 mL/min — ABNORMAL LOW (ref >60)
Glucose, Bld: 125 mg/dL — ABNORMAL HIGH (ref 70–99)
Potassium: 3.8 mmol/L (ref 3.5–5.1)
Sodium: 135 mmol/L (ref 135–145)
Total Bilirubin: 0.6 mg/dL (ref 0.0–1.2)
Total Protein: 8.7 g/dL — ABNORMAL HIGH (ref 6.5–8.1)

## 2024-11-14 LAB — CBC WITH DIFFERENTIAL (CANCER CENTER ONLY)
Abs Immature Granulocytes: 0.02 K/uL (ref 0.00–0.07)
Basophils Absolute: 0 K/uL (ref 0.0–0.1)
Basophils Relative: 0 %
Eosinophils Absolute: 0.1 K/uL (ref 0.0–0.5)
Eosinophils Relative: 2 %
HCT: 32.5 % — ABNORMAL LOW (ref 39.0–52.0)
Hemoglobin: 11.1 g/dL — ABNORMAL LOW (ref 13.0–17.0)
Immature Granulocytes: 0 %
Lymphocytes Relative: 36 %
Lymphs Abs: 1.9 K/uL (ref 0.7–4.0)
MCH: 31.5 pg (ref 26.0–34.0)
MCHC: 34.2 g/dL (ref 30.0–36.0)
MCV: 92.3 fL (ref 80.0–100.0)
Monocytes Absolute: 0.6 K/uL (ref 0.1–1.0)
Monocytes Relative: 11 %
Neutro Abs: 2.7 K/uL (ref 1.7–7.7)
Neutrophils Relative %: 51 %
Platelet Count: 175 K/uL (ref 150–400)
RBC: 3.52 MIL/uL — ABNORMAL LOW (ref 4.22–5.81)
RDW: 14 % (ref 11.5–15.5)
WBC Count: 5.3 K/uL (ref 4.0–10.5)
nRBC: 0 % (ref 0.0–0.2)

## 2024-11-14 MED ORDER — DARATUMUMAB-HYALURONIDASE-FIHJ 1800-30000 MG-UT/15ML ~~LOC~~ SOLN
1800.0000 mg | Freq: Once | SUBCUTANEOUS | Status: AC
  Administered 2024-11-14: 13:00:00 1800 mg via SUBCUTANEOUS
  Filled 2024-11-14: qty 15

## 2024-11-14 MED ORDER — ACETAMINOPHEN 325 MG PO TABS
650.0000 mg | ORAL_TABLET | Freq: Once | ORAL | Status: AC
  Administered 2024-11-14: 12:00:00 650 mg via ORAL
  Filled 2024-11-14: qty 2

## 2024-11-14 MED ORDER — DIPHENHYDRAMINE HCL 25 MG PO CAPS
50.0000 mg | ORAL_CAPSULE | Freq: Once | ORAL | Status: AC
  Administered 2024-11-14: 12:00:00 50 mg via ORAL
  Filled 2024-11-14: qty 2

## 2024-11-14 MED ORDER — DEXAMETHASONE 20 MG PO TABS: ORAL_TABLET | ORAL | 0 refills | Status: DC

## 2024-11-14 MED ORDER — DEXAMETHASONE 6 MG PO TABS
20.0000 mg | ORAL_TABLET | Freq: Once | ORAL | Status: AC
  Administered 2024-11-14: 12:00:00 20 mg via ORAL
  Filled 2024-11-14: qty 2

## 2024-11-14 MED ORDER — DEXAMETHASONE 4 MG PO TABS: ORAL_TABLET | ORAL | 0 refills | Status: AC

## 2024-11-14 MED ORDER — FAMOTIDINE 20 MG PO TABS
20.0000 mg | ORAL_TABLET | Freq: Once | ORAL | Status: AC
  Administered 2024-11-14: 12:00:00 20 mg via ORAL
  Filled 2024-11-14: qty 1

## 2024-11-14 NOTE — Patient Instructions (Signed)
 [facility] - A DEPT OF [facility]  Discharge Instructions: Thank you for choosing [facility] to provide your oncology and hematology care.   If you have a lab appointment with the Cancer Center, please go directly to the Cancer Center and check in at the registration area.   Wear comfortable clothing and clothing appropriate for easy access to any Portacath or PICC line.   We strive to give you quality time with your provider. You may need to reschedule your appointment if you arrive late (15 or more minutes).  Arriving late affects you and other patients whose appointments are after yours.  Also, if you miss three or more appointments without notifying the office, you may be dismissed from the clinic at the provider's discretion.      For prescription refill requests, have your pharmacy contact our office and allow 72 hours for refills to be completed.    Today you received the following chemotherapy and/or immunotherapy agents: daratumumab-hyaluronidase-fihj (DARZALEX FASPRO       To help prevent nausea and vomiting after your treatment, we encourage you to take your nausea medication as directed.  BELOW ARE SYMPTOMS THAT SHOULD BE REPORTED IMMEDIATELY: *FEVER GREATER THAN 100.4 F (38 C) OR HIGHER *CHILLS OR SWEATING *NAUSEA AND VOMITING THAT IS NOT CONTROLLED WITH YOUR NAUSEA MEDICATION *UNUSUAL SHORTNESS OF BREATH *UNUSUAL BRUISING OR BLEEDING *URINARY PROBLEMS (pain or burning when urinating, or frequent urination) *BOWEL PROBLEMS (unusual diarrhea, constipation, pain near the anus) TENDERNESS IN MOUTH AND THROAT WITH OR WITHOUT PRESENCE OF ULCERS (sore throat, sores in mouth, or a toothache) UNUSUAL RASH, SWELLING OR PAIN  UNUSUAL VAGINAL DISCHARGE OR ITCHING   Items with * indicate a potential emergency and should be followed up as soon as possible or go to the Emergency Department if any problems should occur.  Please show the  CHEMOTHERAPY ALERT CARD or IMMUNOTHERAPY ALERT CARD at check-in to the Emergency Department and triage nurse.  Should you have questions after your visit or need to cancel or reschedule your appointment, please contact [facility]  Dept: [phone]  and follow the prompts.  Office hours are 8:00 a.m. to 4:30 p.m. Monday - Friday. Please note that voicemails left after 4:00 p.m. may not be returned until the following business day.  We are closed weekends and major holidays. You have access to a nurse at all times for urgent questions. Please call the main number to the clinic Dept: [phone] and follow the prompts.   For any non-urgent questions, you may also contact your provider using MyChart. We now offer e-Visits for anyone 63 and older to request care online for non-urgent symptoms. For details visit [email or web address].   Also download the MyChart app! Go to the app store, search "MyChart", open the app, select [place], and log in with your MyChart username and password.

## 2024-11-17 ENCOUNTER — Other Ambulatory Visit: Payer: Self-pay | Admitting: Family Medicine

## 2024-11-20 ENCOUNTER — Inpatient Hospital Stay

## 2024-11-20 VITALS — BP 125/56 | HR 81 | Temp 97.8°F | Resp 16 | Ht 71.0 in | Wt 190.8 lb

## 2024-11-20 DIAGNOSIS — D472 Monoclonal gammopathy: Secondary | ICD-10-CM

## 2024-11-20 DIAGNOSIS — Z5112 Encounter for antineoplastic immunotherapy: Secondary | ICD-10-CM | POA: Diagnosis not present

## 2024-11-20 LAB — CBC WITH DIFFERENTIAL (CANCER CENTER ONLY)
Abs Immature Granulocytes: 0.02 K/uL (ref 0.00–0.07)
Basophils Absolute: 0 K/uL (ref 0.0–0.1)
Basophils Relative: 0 %
Eosinophils Absolute: 0.1 K/uL (ref 0.0–0.5)
Eosinophils Relative: 1 %
HCT: 30.7 % — ABNORMAL LOW (ref 39.0–52.0)
Hemoglobin: 10.6 g/dL — ABNORMAL LOW (ref 13.0–17.0)
Immature Granulocytes: 0 %
Lymphocytes Relative: 33 %
Lymphs Abs: 2 K/uL (ref 0.7–4.0)
MCH: 32 pg (ref 26.0–34.0)
MCHC: 34.5 g/dL (ref 30.0–36.0)
MCV: 92.7 fL (ref 80.0–100.0)
Monocytes Absolute: 0.7 K/uL (ref 0.1–1.0)
Monocytes Relative: 12 %
Neutro Abs: 3.4 K/uL (ref 1.7–7.7)
Neutrophils Relative %: 54 %
Platelet Count: 173 K/uL (ref 150–400)
RBC: 3.31 MIL/uL — ABNORMAL LOW (ref 4.22–5.81)
RDW: 14.5 % (ref 11.5–15.5)
WBC Count: 6.2 K/uL (ref 4.0–10.5)
nRBC: 0 % (ref 0.0–0.2)

## 2024-11-20 MED ORDER — FAMOTIDINE 20 MG PO TABS
20.0000 mg | ORAL_TABLET | Freq: Once | ORAL | Status: AC
  Administered 2024-11-20: 20 mg via ORAL
  Filled 2024-11-20: qty 1

## 2024-11-20 MED ORDER — ACETAMINOPHEN 325 MG PO TABS
650.0000 mg | ORAL_TABLET | Freq: Once | ORAL | Status: AC
  Administered 2024-11-20: 650 mg via ORAL
  Filled 2024-11-20: qty 2

## 2024-11-20 MED ORDER — DEXAMETHASONE 6 MG PO TABS
20.0000 mg | ORAL_TABLET | Freq: Once | ORAL | Status: AC
  Administered 2024-11-20: 20 mg via ORAL
  Filled 2024-11-20: qty 2

## 2024-11-20 MED ORDER — DARATUMUMAB-HYALURONIDASE-FIHJ 1800-30000 MG-UT/15ML ~~LOC~~ SOLN
1800.0000 mg | Freq: Once | SUBCUTANEOUS | Status: AC
  Administered 2024-11-20: 1800 mg via SUBCUTANEOUS
  Filled 2024-11-20: qty 15

## 2024-11-20 MED ORDER — DIPHENHYDRAMINE HCL 25 MG PO CAPS
50.0000 mg | ORAL_CAPSULE | Freq: Once | ORAL | Status: AC
  Administered 2024-11-20: 50 mg via ORAL
  Filled 2024-11-20: qty 2

## 2024-11-20 NOTE — Patient Instructions (Signed)
 [facility] - A DEPT OF [place]  Discharge Instructions: Thank you for choosing [facility] to provide your oncology and hematology care.   If you have a lab appointment with the Cancer Center, please go directly to the Cancer Center and check in at the registration area.   Wear comfortable clothing and clothing appropriate for easy access to any Portacath or PICC line.   We strive to give you quality time with your provider. You may need to reschedule your appointment if you arrive late (15 or more minutes).  Arriving late affects you and other patients whose appointments are after yours.  Also, if you miss three or more appointments without notifying the office, you may be dismissed from the clinic at the providers discretion.      For prescription refill requests, have your pharmacy contact our office and allow 72 hours for refills to be completed.    Today you received the following chemotherapy and/or immunotherapy agents: darzalex  Faspro       To help prevent nausea and vomiting after your treatment, we encourage you to take your nausea medication as directed.  BELOW ARE SYMPTOMS THAT SHOULD BE REPORTED IMMEDIATELY: *FEVER GREATER THAN 100.4 F (38 C) OR HIGHER *CHILLS OR SWEATING *NAUSEA AND VOMITING THAT IS NOT CONTROLLED WITH YOUR NAUSEA MEDICATION *UNUSUAL SHORTNESS OF BREATH *UNUSUAL BRUISING OR BLEEDING *URINARY PROBLEMS (pain or burning when urinating, or frequent urination) *BOWEL PROBLEMS (unusual diarrhea, constipation, pain near the anus) TENDERNESS IN MOUTH AND THROAT WITH OR WITHOUT PRESENCE OF ULCERS (sore throat, sores in mouth, or a toothache) UNUSUAL RASH, SWELLING OR PAIN  UNUSUAL VAGINAL DISCHARGE OR ITCHING   Items with * indicate a potential emergency and should be followed up as soon as possible or go to the Emergency Department if any problems should occur.  Please show the CHEMOTHERAPY ALERT CARD or  IMMUNOTHERAPY ALERT CARD at check-in to the Emergency Department and triage nurse.  Should you have questions after your visit or need to cancel or reschedule your appointment, please contact [facility] - A DEPT OF [facility]  Dept: [phone]  and follow the prompts.  Office hours are 8:00 a.m. to 4:30 p.m. Monday - Friday. Please note that voicemails left after 4:00 p.m. may not be returned until the following business day.  We are closed weekends and major holidays. You have access to a nurse at all times for urgent questions. Please call the main number to the clinic Dept: [phone] and follow the prompts.   For any non-urgent questions, you may also contact your provider using MyChart. We now offer e-Visits for anyone 56 and older to request care online for non-urgent symptoms. For details visit [email or web address].   Also download the MyChart app! Go to the app store, search MyChart, open the app, select [place], and log in with your MyChart username and password.

## 2024-11-20 NOTE — Progress Notes (Signed)
 Patient did not take dexamethasone  at home per RN. Ordered dexamethasone  20 mg PO x1 to be given in infusion.  Harlene Nasuti, PharmD Oncology Infusion Pharmacist 11/20/2024 1:50 PM

## 2024-11-21 LAB — KAPPA/LAMBDA LIGHT CHAINS
Kappa free light chain: 4.3 mg/L (ref 3.3–19.4)
Kappa, lambda light chain ratio: 0.05 — ABNORMAL LOW (ref 0.26–1.65)
Lambda free light chains: 86.9 mg/L — ABNORMAL HIGH (ref 5.7–26.3)

## 2024-11-27 ENCOUNTER — Inpatient Hospital Stay

## 2024-11-27 LAB — MULTIPLE MYELOMA PANEL, SERUM
Albumin SerPl Elph-Mcnc: 2.9 g/dL (ref 2.9–4.4)
Albumin/Glob SerPl: 0.7 (ref 0.7–1.7)
Alpha 1: 0.4 g/dL (ref 0.0–0.4)
Alpha2 Glob SerPl Elph-Mcnc: 1 g/dL (ref 0.4–1.0)
B-Globulin SerPl Elph-Mcnc: 0.8 g/dL (ref 0.7–1.3)
Gamma Glob SerPl Elph-Mcnc: 2.2 g/dL — ABNORMAL HIGH (ref 0.4–1.8)
Globulin, Total: 4.4 g/dL — ABNORMAL HIGH (ref 2.2–3.9)
IgA: 13 mg/dL — ABNORMAL LOW (ref 61–437)
IgG (Immunoglobin G), Serum: 3307 mg/dL — ABNORMAL HIGH (ref 603–1613)
IgM (Immunoglobulin M), Srm: 12 mg/dL — ABNORMAL LOW (ref 15–143)
M Protein SerPl Elph-Mcnc: 2.1 g/dL — ABNORMAL HIGH
Total Protein ELP: 7.3 g/dL (ref 6.0–8.5)

## 2024-11-27 NOTE — Progress Notes (Unsigned)
 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 male who is here today for continued evaluation and management of smoldering multiple myeloma. He is unaccompanied for this visit.   [name] reports that he does have ongoing fatigue which has been persistent since starting treatment. He is able to complete his ADLs on his own. He denies appetite or weight changes. He denies nausea, vomiting or bowel habit changes. He denies easy bruising or overt signs of bleeding. He denies fevers, chills,sweats, shortness of breath, chest pain or cough. He has no other complaints.   REVIEW OF SYSTEMS:   10 Point review of systems of done and is negative except as noted above.  MEDICAL HISTORY Past Medical History:  Diagnosis Date   Arthritis    back   Cancer [facility])    prostate, sees Dr. Gretel Ferrara    Chronic kidney disease 2017   stage 3 kidney disease   Diabetes mellitus    Erectile dysfunction    GERD (gastroesophageal reflux disease)    Hyperlipidemia    Hypertension    Migraines    Peptic ulcer    Thyroid  disease     SURGICAL HISTORY Past Surgical History:  Procedure Laterality Date   APPENDECTOMY  1950   COLONOSCOPY  05/22/2021   per Dr. Aneita, benign polyps, no repeats needed   HERNIA REPAIR     umbilical    MENISCUS REPAIR Left    PROSTATE SURGERY  2009   radical prostatectomy   SPINE SURGERY     L4-L5 fusion per Dr. Oneil Carwin    TONSILLECTOMY AND ADENOIDECTOMY  1954    SOCIAL HISTORY Social History   Tobacco Use   Smoking status: Former    Current packs/day: 0.00    Average packs/day: 0.3 packs/day for 5.0 years (1.3 ttl pk-yrs)    Types: Cigarettes     Start date: 86    Quit date: 19    Years since quitting: 59.0   Smokeless tobacco: Never  Vaping Use   Vaping status: Never Used  Substance Use Topics   Alcohol use: Not Currently    Comment: rare one glass every 6 months   Drug use: No    Social History   Social History Narrative   Not on file    SOCIAL DRIVERS OF HEALTH SDOH Screenings   Food Insecurity: No Food Insecurity (03/15/2023)  Housing: Low Risk (03/15/2023)  Transportation Needs: No Transportation Needs (03/15/2023)  Alcohol Screen: Low Risk (01/15/2022)  Depression (PHQ2-9): Low Risk (11/20/2024)  Financial Resource Strain: Low Risk (07/14/2022)  Physical Activity: Insufficiently Active (03/15/2023)  Social Connections: Moderately Isolated (03/15/2023)  Stress: Stress Concern Present (03/15/2023)  Tobacco Use: Medium Risk (09/11/2024)     FAMILY HISTORY Family History  Problem Relation Age of Onset   Hypertension Other    Cancer Other    Colon cancer Neg Hx    Colon polyps Neg Hx    Esophageal cancer Neg Hx    Rectal cancer Neg Hx    Stomach cancer Neg Hx      ALLERGIES: has no known allergies.  MEDICATIONS  Current Outpatient Medications  Medication Sig Dispense Refill   acyclovir  (ZOVIRAX ) 400 MG tablet Take 1 tablet (400  mg total) by mouth 2 (two) times daily. 60 tablet 11   aspirin-acetaminophen -caffeine  (EXCEDRIN MIGRAINE) 250-250-65 MG tablet Take 1 tablet by mouth every 8 (eight) hours as needed for migraine.     atorvastatin  (LIPITOR) 10 MG tablet TAKE 1 TABLET BY MOUTH EVERY DAY 90 tablet 3   Blood Glucose Monitoring Suppl (ONE TOUCH ULTRA 2) w/Device KIT Use to check blood sugars once daily. 1 kit 0   canagliflozin  (INVOKANA ) 300 MG TABS tablet TAKE 1 TABLET (300 MG TOTAL) BY MOUTH DAILY BEFORE BREAKFAST. 90 tablet 0   CHELATED ZINC PO Take 1 capsule by mouth daily. (Patient not taking: Reported on 11/14/2024)     Cholecalciferol (VITAMIN D3) 2000 units capsule Take 2,000 Units by mouth  daily.      Continuous Glucose Sensor (FREESTYLE LIBRE 3 PLUS SENSOR) MISC PLACE 1 SENSOR ON THE SKIN EVERY 14 DAYS. USE TO CHECK GLUCOSE CONTINUOUSLY 2 each 5   Cyanocobalamin  (VITAMIN B 12 PO) Take 2,500 mcg by mouth daily.      dexamethasone  (DECADRON ) 4 MG tablet Take 5 tablets (20 mg) by mouth 30-60 minutes before daratumumab  treatment 90 tablet 0   esomeprazole  (NEXIUM ) 40 MG capsule TAKE 1 CAPSULE (40 MG TOTAL) BY MOUTH DAILY. 90 capsule 3   FOLIC ACID PO Take 800 mcg by mouth daily.  (Patient not taking: Reported on 11/14/2024)     glipiZIDE  (GLUCOTROL ) 5 MG tablet Take 1 tablet (5 mg total) by mouth 2 (two) times daily before a meal. 180 tablet 3   Levomefolate Glucosamine (METHYLFOLATE PO) Take 1 tablet by mouth daily. (Patient not taking: Reported on 11/14/2024)     LORazepam  (ATIVAN ) 2 MG tablet Take 1 tablet (2 mg total) by mouth every 6 (six) hours as needed for anxiety. 30 tablet 2   olmesartan  (BENICAR ) 40 MG tablet TAKE 1 TABLET BY MOUTH EVERY DAY 90 tablet 3   Omega-3 Fatty Acids (FISH OIL) 1000 MG CAPS Take by mouth daily.      ondansetron  (ZOFRAN ) 8 MG tablet Take 1 tablet (8 mg total) by mouth every 8 (eight) hours as needed for nausea or vomiting. 30 tablet 1   OneTouch Delica Lancets 30G MISC USE ONCE A DAY WHEN CHECKING BLOOD SUGAR 100 each 3   ONETOUCH VERIO test strip USE ONCE A DAY TO TEST BLOOD SUGAR 100 strip 1   pyridOXINE (VITAMIN B-6) 100 MG tablet Take 100 mg by mouth daily.     sodium bicarbonate  650 MG tablet Take 1 tablet (650 mg total) by mouth 2 (two) times daily. 60 tablet 0   SUMAtriptan  (IMITREX ) 100 MG tablet TAKE 1 TABLET (100 MG TOTAL) BY MOUTH AS NEEDED FOR MIGRAINE. MAY REPEAT IN 2 HOURS IF HEADACHE PERSISTS OR RECURS. 9 tablet 6   SYNTHROID  137 MCG tablet TAKE 1 TABLET BY MOUTH DAILY BEFORE BREAKFAST. 90 tablet 3   traZODone  (DESYREL ) 100 MG tablet TAKE 1 TABLET BY MOUTH EVERYDAY AT BEDTIME 90 tablet 3   No current facility-administered medications  for this visit.    PHYSICAL EXAMINATION: ECOG PERFORMANCE STATUS: 1 - Symptomatic but completely ambulatory VITALS: There were no vitals filed for this visit.   There were no vitals filed for this visit.   Day 8, Cycle 2 11/28/24  Weight 188 lb 8 oz (85.5 kg)  Temp 97.7 F (36.5 C)  Temp src Oral  Pulse 75  Resp 18  BP 106/68  BP Location Left Arm   There is no height or weight  on file to calculate BMI.  GENERAL: alert, in no acute distress and comfortable SKIN: no acute rashes, no significant lesions EYES: conjunctiva are pink and non-injected, sclera anicteric LUNGS: clear to auscultation b/l with normal respiratory effort HEART: regular rate & rhythm Extremity: no pedal edema PSYCH: alert & oriented x 3 with fluent speech NEURO: no focal motor/sensory deficits  LABORATORY DATA:   I have reviewed the data as listed     Latest Ref Rng & Units 11/20/2024   12:54 PM 11/14/2024   10:39 AM 11/06/2024    1:58 PM  CBC EXTENDED  WBC 4.0 - 10.5 K/uL 6.2  5.3  5.8   RBC 4.22 - 5.81 MIL/uL 3.31  3.52  3.65   Hemoglobin 13.0 - 17.0 g/dL 89.3  88.8  88.3   HCT 39.0 - 52.0 % 30.7  32.5  34.7   Platelets 150 - 400 K/uL 173  175  184   NEUT# 1.7 - 7.7 K/uL 3.4  2.7  3.1   Lymph# 0.7 - 4.0 K/uL 2.0  1.9  1.9         Latest Ref Rng & Units 11/14/2024   11:55 AM 11/06/2024    1:58 PM 10/30/2024   11:40 AM  CMP  Glucose 70 - 99 mg/dL 874  866  838   BUN 8 - 23 mg/dL 48  57  40   Creatinine 0.61 - 1.24 mg/dL 7.86  7.91  7.50   Sodium 135 - 145 mmol/L 135  132  129   Potassium 3.5 - 5.1 mmol/L 3.8  4.8  5.5   Chloride 98 - 111 mmol/L 105  105  103   CO2 22 - 32 mmol/L 21  18  18    Calcium  8.9 - 10.3 mg/dL 8.8  8.5  8.3   Total Protein 6.5 - 8.1 g/dL 8.7  8.2  8.4   Total Bilirubin 0.0 - 1.2 mg/dL 0.6  0.4  0.4   Alkaline Phos 38 - 126 U/L 71  66  56   AST 15 - 41 U/L 12  13  16    ALT 0 - 44 U/L 14  17  20       RADIOGRAPHIC STUDIES: I have personally reviewed the  radiological images as listed and agreed with the findings in the report. No results found.  ASSESSMENT:  Stanely G Schue is a 83 y.o. who presents to the clinic for continued management of smoldering multiple myeloma.   1.  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 Cytogenetics shows normal male karyotype. 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 --Started daratumumab  therapy on 10/23/2024.    2. 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 Gearline  --mild non nephrotic proteinuria on 24h UPEP-Continue follow up with Dr Gearline for CKD     3. Vitamin B12 deficient  --Antibody testing done - no evidence of pernicious anemia. --No evidence of pernicious anemia based on neg IF and parietal cell ab --continue B12 replacement per PCP  PLAN: --Due for cycle 2, day 8 of daratumumab  today --Labs were reviewed and adequate for treatment. WBC 5.8, Hgb 10.9, Plt 192. --Most recent myeloma labs from 11/20/2024 showed M protein improved to 2.1. Lambda light chain improved to 86.9. --Proceed with treatment today without any dose modifications. --RTC in 2 weeks for next toxicity check   All of the patient's questions were answered  and the patient knows to call the clinic with any problems, questions, or concerns.   I have spent a total of 30 minutes minutes of face-to-face and non-face-to-face time, preparing to see the patient,performing a medically appropriate examination, counseling and educating the patient, ordering medications, documenting clinical information in the electronic health record, independently interpreting results and communicating results to the patient, and care coordination.   Johnston Police PA-C Dept of Hematology and Oncology [facility] Phone: [phone]  "

## 2024-11-28 ENCOUNTER — Other Ambulatory Visit: Payer: Self-pay

## 2024-11-28 ENCOUNTER — Encounter: Payer: Self-pay | Admitting: Hematology

## 2024-11-28 ENCOUNTER — Inpatient Hospital Stay

## 2024-11-28 ENCOUNTER — Inpatient Hospital Stay: Admitting: Physician Assistant

## 2024-11-28 VITALS — BP 106/68 | HR 75 | Temp 97.7°F | Resp 18 | Wt 188.5 lb

## 2024-11-28 DIAGNOSIS — C9000 Multiple myeloma not having achieved remission: Secondary | ICD-10-CM

## 2024-11-28 DIAGNOSIS — Z5112 Encounter for antineoplastic immunotherapy: Secondary | ICD-10-CM | POA: Diagnosis not present

## 2024-11-28 DIAGNOSIS — D472 Monoclonal gammopathy: Secondary | ICD-10-CM

## 2024-11-28 LAB — CBC WITH DIFFERENTIAL (CANCER CENTER ONLY)
Abs Immature Granulocytes: 0.02 K/uL (ref 0.00–0.07)
Basophils Absolute: 0 K/uL (ref 0.0–0.1)
Basophils Relative: 0 %
Eosinophils Absolute: 0.1 K/uL (ref 0.0–0.5)
Eosinophils Relative: 1 %
HCT: 32.1 % — ABNORMAL LOW (ref 39.0–52.0)
Hemoglobin: 10.9 g/dL — ABNORMAL LOW (ref 13.0–17.0)
Immature Granulocytes: 0 %
Lymphocytes Relative: 41 %
Lymphs Abs: 2.3 K/uL (ref 0.7–4.0)
MCH: 31.6 pg (ref 26.0–34.0)
MCHC: 34 g/dL (ref 30.0–36.0)
MCV: 93 fL (ref 80.0–100.0)
Monocytes Absolute: 0.7 K/uL (ref 0.1–1.0)
Monocytes Relative: 12 %
Neutro Abs: 2.6 K/uL (ref 1.7–7.7)
Neutrophils Relative %: 46 %
Platelet Count: 192 K/uL (ref 150–400)
RBC: 3.45 MIL/uL — ABNORMAL LOW (ref 4.22–5.81)
RDW: 14.5 % (ref 11.5–15.5)
WBC Count: 5.8 K/uL (ref 4.0–10.5)
nRBC: 0 % (ref 0.0–0.2)

## 2024-11-28 MED ORDER — DARATUMUMAB-HYALURONIDASE-FIHJ 1800-30000 MG-UT/15ML ~~LOC~~ SOLN
1800.0000 mg | Freq: Once | SUBCUTANEOUS | Status: AC
  Administered 2024-11-28: 1800 mg via SUBCUTANEOUS
  Filled 2024-11-28: qty 15

## 2024-11-28 MED ORDER — ACETAMINOPHEN 325 MG PO TABS
650.0000 mg | ORAL_TABLET | Freq: Once | ORAL | Status: AC
  Administered 2024-11-28: 650 mg via ORAL
  Filled 2024-11-28: qty 2

## 2024-11-28 MED ORDER — DIPHENHYDRAMINE HCL 25 MG PO CAPS
50.0000 mg | ORAL_CAPSULE | Freq: Once | ORAL | Status: AC
  Administered 2024-11-28: 50 mg via ORAL
  Filled 2024-11-28: qty 2

## 2024-11-28 MED ORDER — FAMOTIDINE 20 MG PO TABS
20.0000 mg | ORAL_TABLET | Freq: Once | ORAL | Status: AC
  Administered 2024-11-28: 20 mg via ORAL
  Filled 2024-11-28: qty 1

## 2024-11-28 NOTE — Progress Notes (Signed)
 This encounter was created in error - please disregard.

## 2024-11-29 ENCOUNTER — Other Ambulatory Visit: Payer: Self-pay | Admitting: Hematology

## 2024-12-04 ENCOUNTER — Inpatient Hospital Stay: Admitting: Hematology

## 2024-12-04 ENCOUNTER — Inpatient Hospital Stay: Attending: Hematology

## 2024-12-04 ENCOUNTER — Inpatient Hospital Stay

## 2024-12-04 VITALS — BP 94/60 | HR 80 | Temp 97.8°F | Resp 16 | Wt 189.2 lb

## 2024-12-04 DIAGNOSIS — Z7984 Long term (current) use of oral hypoglycemic drugs: Secondary | ICD-10-CM | POA: Diagnosis not present

## 2024-12-04 DIAGNOSIS — Z79899 Other long term (current) drug therapy: Secondary | ICD-10-CM | POA: Insufficient documentation

## 2024-12-04 DIAGNOSIS — Z79890 Hormone replacement therapy (postmenopausal): Secondary | ICD-10-CM | POA: Diagnosis not present

## 2024-12-04 DIAGNOSIS — Z79624 Long term (current) use of inhibitors of nucleotide synthesis: Secondary | ICD-10-CM | POA: Diagnosis not present

## 2024-12-04 DIAGNOSIS — N183 Chronic kidney disease, stage 3 unspecified: Secondary | ICD-10-CM | POA: Diagnosis not present

## 2024-12-04 DIAGNOSIS — Z5112 Encounter for antineoplastic immunotherapy: Secondary | ICD-10-CM | POA: Insufficient documentation

## 2024-12-04 DIAGNOSIS — K219 Gastro-esophageal reflux disease without esophagitis: Secondary | ICD-10-CM | POA: Diagnosis not present

## 2024-12-04 DIAGNOSIS — I129 Hypertensive chronic kidney disease with stage 1 through stage 4 chronic kidney disease, or unspecified chronic kidney disease: Secondary | ICD-10-CM | POA: Diagnosis not present

## 2024-12-04 DIAGNOSIS — G4700 Insomnia, unspecified: Secondary | ICD-10-CM | POA: Diagnosis not present

## 2024-12-04 DIAGNOSIS — C9000 Multiple myeloma not having achieved remission: Secondary | ICD-10-CM | POA: Insufficient documentation

## 2024-12-04 DIAGNOSIS — E039 Hypothyroidism, unspecified: Secondary | ICD-10-CM | POA: Diagnosis not present

## 2024-12-04 DIAGNOSIS — E1122 Type 2 diabetes mellitus with diabetic chronic kidney disease: Secondary | ICD-10-CM | POA: Insufficient documentation

## 2024-12-04 DIAGNOSIS — E538 Deficiency of other specified B group vitamins: Secondary | ICD-10-CM | POA: Diagnosis not present

## 2024-12-04 DIAGNOSIS — Z87891 Personal history of nicotine dependence: Secondary | ICD-10-CM | POA: Diagnosis not present

## 2024-12-04 DIAGNOSIS — E785 Hyperlipidemia, unspecified: Secondary | ICD-10-CM | POA: Diagnosis not present

## 2024-12-04 DIAGNOSIS — D472 Monoclonal gammopathy: Secondary | ICD-10-CM

## 2024-12-04 DIAGNOSIS — M1990 Unspecified osteoarthritis, unspecified site: Secondary | ICD-10-CM | POA: Insufficient documentation

## 2024-12-04 LAB — CMP (CANCER CENTER ONLY)
ALT: 12 U/L (ref 0–44)
AST: 10 U/L — ABNORMAL LOW (ref 15–41)
Albumin: 3.4 g/dL — ABNORMAL LOW (ref 3.5–5.0)
Alkaline Phosphatase: 72 U/L (ref 38–126)
Anion gap: 10 (ref 5–15)
BUN: 33 mg/dL — ABNORMAL HIGH (ref 8–23)
CO2: 21 mmol/L — ABNORMAL LOW (ref 22–32)
Calcium: 8.7 mg/dL — ABNORMAL LOW (ref 8.9–10.3)
Chloride: 105 mmol/L (ref 98–111)
Creatinine: 1.89 mg/dL — ABNORMAL HIGH (ref 0.61–1.24)
GFR, Estimated: 35 mL/min — ABNORMAL LOW
Glucose, Bld: 201 mg/dL — ABNORMAL HIGH (ref 70–99)
Potassium: 4.4 mmol/L (ref 3.5–5.1)
Sodium: 135 mmol/L (ref 135–145)
Total Bilirubin: 0.8 mg/dL (ref 0.0–1.2)
Total Protein: 8.1 g/dL (ref 6.5–8.1)

## 2024-12-04 LAB — CBC WITH DIFFERENTIAL (CANCER CENTER ONLY)
Abs Immature Granulocytes: 0.03 K/uL (ref 0.00–0.07)
Basophils Absolute: 0 K/uL (ref 0.0–0.1)
Basophils Relative: 0 %
Eosinophils Absolute: 0.1 K/uL (ref 0.0–0.5)
Eosinophils Relative: 1 %
HCT: 33.5 % — ABNORMAL LOW (ref 39.0–52.0)
Hemoglobin: 11.6 g/dL — ABNORMAL LOW (ref 13.0–17.0)
Immature Granulocytes: 1 %
Lymphocytes Relative: 33 %
Lymphs Abs: 1.8 K/uL (ref 0.7–4.0)
MCH: 32.1 pg (ref 26.0–34.0)
MCHC: 34.6 g/dL (ref 30.0–36.0)
MCV: 92.8 fL (ref 80.0–100.0)
Monocytes Absolute: 0.5 K/uL (ref 0.1–1.0)
Monocytes Relative: 8 %
Neutro Abs: 3.2 K/uL (ref 1.7–7.7)
Neutrophils Relative %: 57 %
Platelet Count: 197 K/uL (ref 150–400)
RBC: 3.61 MIL/uL — ABNORMAL LOW (ref 4.22–5.81)
RDW: 14.4 % (ref 11.5–15.5)
WBC Count: 5.6 K/uL (ref 4.0–10.5)
nRBC: 0 % (ref 0.0–0.2)

## 2024-12-04 MED ORDER — FAMOTIDINE 20 MG PO TABS
20.0000 mg | ORAL_TABLET | Freq: Once | ORAL | Status: AC
  Administered 2024-12-04: 20 mg via ORAL
  Filled 2024-12-04: qty 1

## 2024-12-04 MED ORDER — ACETAMINOPHEN 325 MG PO TABS
650.0000 mg | ORAL_TABLET | Freq: Once | ORAL | Status: AC
  Administered 2024-12-04: 650 mg via ORAL
  Filled 2024-12-04: qty 2

## 2024-12-04 MED ORDER — DIPHENHYDRAMINE HCL 25 MG PO CAPS
50.0000 mg | ORAL_CAPSULE | Freq: Once | ORAL | Status: AC
  Administered 2024-12-04: 50 mg via ORAL
  Filled 2024-12-04: qty 2

## 2024-12-04 MED ORDER — DARATUMUMAB-HYALURONIDASE-FIHJ 1800-30000 MG-UT/15ML ~~LOC~~ SOLN
1800.0000 mg | Freq: Once | SUBCUTANEOUS | Status: AC
  Administered 2024-12-04: 1800 mg via SUBCUTANEOUS
  Filled 2024-12-04: qty 15

## 2024-12-04 NOTE — Progress Notes (Signed)
 Patient took his steroid at home approximately [phone]

## 2024-12-04 NOTE — Patient Instructions (Signed)
 [facility] - A DEPT OF [place]  Discharge Instructions: Thank you for choosing [facility] to provide your oncology and hematology care.   If you have a lab appointment with the Cancer Center, please go directly to the Cancer Center and check in at the registration area.   Wear comfortable clothing and clothing appropriate for easy access to any Portacath or PICC line.   We strive to give you quality time with your provider. You may need to reschedule your appointment if you arrive late (15 or more minutes).  Arriving late affects you and other patients whose appointments are after yours.  Also, if you miss three or more appointments without notifying the office, you may be dismissed from the clinic at the providers discretion.      For prescription refill requests, have your pharmacy contact our office and allow 72 hours for refills to be completed.    Today you received the following chemotherapy and/or immunotherapy agents: darzalex  Faspro       To help prevent nausea and vomiting after your treatment, we encourage you to take your nausea medication as directed.  BELOW ARE SYMPTOMS THAT SHOULD BE REPORTED IMMEDIATELY: *FEVER GREATER THAN 100.4 F (38 C) OR HIGHER *CHILLS OR SWEATING *NAUSEA AND VOMITING THAT IS NOT CONTROLLED WITH YOUR NAUSEA MEDICATION *UNUSUAL SHORTNESS OF BREATH *UNUSUAL BRUISING OR BLEEDING *URINARY PROBLEMS (pain or burning when urinating, or frequent urination) *BOWEL PROBLEMS (unusual diarrhea, constipation, pain near the anus) TENDERNESS IN MOUTH AND THROAT WITH OR WITHOUT PRESENCE OF ULCERS (sore throat, sores in mouth, or a toothache) UNUSUAL RASH, SWELLING OR PAIN  UNUSUAL VAGINAL DISCHARGE OR ITCHING   Items with * indicate a potential emergency and should be followed up as soon as possible or go to the Emergency Department if any problems should occur.  Please show the CHEMOTHERAPY ALERT CARD or  IMMUNOTHERAPY ALERT CARD at check-in to the Emergency Department and triage nurse.  Should you have questions after your visit or need to cancel or reschedule your appointment, please contact [facility] - A DEPT OF [facility]  Dept: [phone]  and follow the prompts.  Office hours are 8:00 a.m. to 4:30 p.m. Monday - Friday. Please note that voicemails left after 4:00 p.m. may not be returned until the following business day.  We are closed weekends and major holidays. You have access to a nurse at all times for urgent questions. Please call the main number to the clinic Dept: [phone] and follow the prompts.   For any non-urgent questions, you may also contact your provider using MyChart. We now offer e-Visits for anyone 56 and older to request care online for non-urgent symptoms. For details visit [email or web address].   Also download the MyChart app! Go to the app store, search MyChart, open the app, select [place], and log in with your MyChart username and password.

## 2024-12-07 ENCOUNTER — Other Ambulatory Visit (HOSPITAL_COMMUNITY): Payer: Self-pay

## 2024-12-07 ENCOUNTER — Other Ambulatory Visit: Payer: Self-pay

## 2024-12-11 ENCOUNTER — Inpatient Hospital Stay

## 2024-12-11 ENCOUNTER — Inpatient Hospital Stay: Admitting: Hematology

## 2024-12-11 VITALS — BP 123/70 | HR 67 | Temp 97.7°F | Resp 20 | Wt 190.4 lb

## 2024-12-11 VITALS — BP 130/62 | HR 68 | Temp 98.0°F | Resp 20

## 2024-12-11 DIAGNOSIS — Z5112 Encounter for antineoplastic immunotherapy: Secondary | ICD-10-CM | POA: Diagnosis not present

## 2024-12-11 DIAGNOSIS — D472 Monoclonal gammopathy: Secondary | ICD-10-CM

## 2024-12-11 LAB — CBC WITH DIFFERENTIAL (CANCER CENTER ONLY)
Abs Immature Granulocytes: 0.02 K/uL (ref 0.00–0.07)
Basophils Absolute: 0 K/uL (ref 0.0–0.1)
Basophils Relative: 0 %
Eosinophils Absolute: 0.1 K/uL (ref 0.0–0.5)
Eosinophils Relative: 1 %
HCT: 33.9 % — ABNORMAL LOW (ref 39.0–52.0)
Hemoglobin: 11.4 g/dL — ABNORMAL LOW (ref 13.0–17.0)
Immature Granulocytes: 0 %
Lymphocytes Relative: 36 %
Lymphs Abs: 2.5 K/uL (ref 0.7–4.0)
MCH: 31.5 pg (ref 26.0–34.0)
MCHC: 33.6 g/dL (ref 30.0–36.0)
MCV: 93.6 fL (ref 80.0–100.0)
Monocytes Absolute: 0.8 K/uL (ref 0.1–1.0)
Monocytes Relative: 11 %
Neutro Abs: 3.7 K/uL (ref 1.7–7.7)
Neutrophils Relative %: 52 %
Platelet Count: 173 K/uL (ref 150–400)
RBC: 3.62 MIL/uL — ABNORMAL LOW (ref 4.22–5.81)
RDW: 14.5 % (ref 11.5–15.5)
WBC Count: 7.1 K/uL (ref 4.0–10.5)
nRBC: 0 % (ref 0.0–0.2)

## 2024-12-11 MED ORDER — DARATUMUMAB-HYALURONIDASE-FIHJ 1800-30000 MG-UT/15ML ~~LOC~~ SOLN
1800.0000 mg | Freq: Once | SUBCUTANEOUS | Status: AC
  Administered 2024-12-11: 1800 mg via SUBCUTANEOUS
  Filled 2024-12-11: qty 15

## 2024-12-11 MED ORDER — ACETAMINOPHEN 325 MG PO TABS
650.0000 mg | ORAL_TABLET | Freq: Once | ORAL | Status: AC
  Administered 2024-12-11: 650 mg via ORAL
  Filled 2024-12-11: qty 2

## 2024-12-11 MED ORDER — FAMOTIDINE 20 MG PO TABS
20.0000 mg | ORAL_TABLET | Freq: Once | ORAL | Status: AC
  Administered 2024-12-11: 20 mg via ORAL
  Filled 2024-12-11: qty 1

## 2024-12-11 MED ORDER — DIPHENHYDRAMINE HCL 25 MG PO CAPS
50.0000 mg | ORAL_CAPSULE | Freq: Once | ORAL | Status: AC
  Administered 2024-12-11: 50 mg via ORAL
  Filled 2024-12-11: qty 2

## 2024-12-11 NOTE — Progress Notes (Signed)
 Per patient, he already took his steroid at home.

## 2024-12-11 NOTE — Progress Notes (Signed)
 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 male is a here because of positive M-spike. The patient was referred by nephrologist Dr. Gearline. The patient presents to the clinic today accompanied by his wife.   He is being seen by nephrologist who pursued further work up to why his Creatinine has increased. Upon workup his M-protein was found to be 0.8g/dl.  He has had HTN and DM for many years that can affects his Kidney function and was thought to be the likely cause of his CKD.    Today his wife notes he has had muscle pain, muscle mass loss and fatigue.    He has HTN, DM, GERD, Hypothyroid, Hyperlipidemia. He was recently taken off statins, meloxicam , ASA and metformin . He takes Nexium  for her GERD and has been on for many years.    On review of symptoms, pt notes purposeful weight loss, muscle loss and muscle pain. He also has leg pain from b/l knees to anterior shin.    SUMMARY OF ONCOLOGIC HISTORY: Oncology History  Smoldering myeloma  10/10/2024 Initial Diagnosis   Smoldering myeloma   10/23/2024 -  Chemotherapy   Patient is on Treatment Plan : MYELOMA Daratumumab  SQ q28d       INTERVAL HISTORY:  Troy Howard is a 84 y.o. 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 He is accompanied by his wife.   he was last seen by me on 10/30/2024; at the time he mentioned experiencing worsening chronic fatigue.   Today, he notes that he is still feeling fatigued. He notes feeling the most fatigued after infusions.  He states that he is feeling well overall. He is eating and sleeping well. He  occasionally goes on walks.   He is up to date on his vaccines.  Denies nausea, vomiting, and bone pains.  REVIEW OF SYSTEMS:   10 Point review of systems of done and is negative except as noted above.  MEDICAL HISTORY Past Medical History:  Diagnosis Date   Arthritis    back   Cancer [facility])    prostate, sees Dr. Gretel Ferrara    Chronic kidney disease 2017   stage 3 kidney disease   Diabetes mellitus    Erectile dysfunction    GERD (gastroesophageal reflux disease)    Hyperlipidemia    Hypertension    Migraines    Peptic ulcer    Thyroid  disease     SURGICAL HISTORY Past Surgical History:  Procedure Laterality Date   APPENDECTOMY  1950   COLONOSCOPY  05/22/2021   per Dr. Aneita, benign polyps, no repeats needed   HERNIA REPAIR     umbilical    MENISCUS REPAIR Left    PROSTATE SURGERY  2009   radical prostatectomy   SPINE SURGERY     L4-L5 fusion per Dr. Oneil Carwin    TONSILLECTOMY AND ADENOIDECTOMY  1954    SOCIAL HISTORY Social History[1]  Social History   Social History Narrative   Not on file    SOCIAL DRIVERS OF HEALTH SDOH Screenings   Food Insecurity: No Food Insecurity (03/15/2023)  Housing: Low Risk (03/15/2023)  Transportation Needs: No Transportation Needs (03/15/2023)  Alcohol  Screen: Low Risk (01/15/2022)  Depression (PHQ2-9): Low Risk (11/28/2024)  Financial Resource Strain: Low Risk (07/14/2022)  Physical Activity: Insufficiently Active (03/15/2023)  Social Connections: Moderately Isolated (03/15/2023)  Stress: Stress Concern Present (03/15/2023)  Tobacco Use: Medium Risk (09/11/2024)     FAMILY HISTORY Family History  Problem Relation Age of Onset   Hypertension Other    Cancer Other    Colon cancer Neg Hx    Colon polyps Neg Hx    Esophageal cancer Neg Hx    Rectal cancer Neg Hx    Stomach cancer Neg Hx      ALLERGIES: has no known allergies.  MEDICATIONS  Current Outpatient Medications  Medication Sig Dispense Refill    acyclovir  (ZOVIRAX ) 400 MG tablet Take 1 tablet (400 mg total) by mouth 2 (two) times daily. 60 tablet 11   aspirin-acetaminophen -caffeine  (EXCEDRIN MIGRAINE) 250-250-65 MG tablet Take 1 tablet by mouth every 8 (eight) hours as needed for migraine.     atorvastatin  (LIPITOR) 10 MG tablet TAKE 1 TABLET BY MOUTH EVERY DAY 90 tablet 3   Blood Glucose Monitoring Suppl (ONE TOUCH ULTRA 2) w/Device KIT Use to check blood sugars once daily. 1 kit 0   canagliflozin  (INVOKANA ) 300 MG TABS tablet TAKE 1 TABLET (300 MG TOTAL) BY MOUTH DAILY BEFORE BREAKFAST. 90 tablet 0   CHELATED ZINC PO Take 1 capsule by mouth daily.     Cholecalciferol (VITAMIN D3) 2000 units capsule Take 2,000 Units by mouth daily.      Continuous Glucose Sensor (FREESTYLE LIBRE 3 PLUS SENSOR) MISC PLACE 1 SENSOR ON THE SKIN EVERY 14 DAYS. USE TO CHECK GLUCOSE CONTINUOUSLY 2 each 5   Cyanocobalamin  (VITAMIN B 12 PO) Take 2,500 mcg by mouth daily.      dexamethasone  (DECADRON ) 4 MG tablet Take 5 tablets (20 mg) by mouth 30-60 minutes before daratumumab  treatment 90 tablet 0   esomeprazole  (NEXIUM ) 40 MG capsule TAKE 1 CAPSULE (40 MG TOTAL) BY MOUTH DAILY. 90 capsule 3   FOLIC ACID PO Take 800 mcg by mouth daily.      glipiZIDE  (GLUCOTROL ) 5 MG tablet Take 1 tablet (5 mg total) by mouth 2 (two) times daily before a meal. 180 tablet 3   Levomefolate Glucosamine (METHYLFOLATE PO) Take 1 tablet by mouth daily.     LORazepam  (ATIVAN ) 2 MG tablet Take 1 tablet (2 mg total) by mouth every 6 (six) hours as needed for anxiety. 30 tablet 2   olmesartan  (BENICAR ) 40 MG tablet TAKE 1 TABLET BY MOUTH EVERY DAY 90 tablet 3   Omega-3 Fatty Acids (FISH OIL) 1000 MG CAPS Take by mouth daily.      ondansetron  (ZOFRAN ) 8 MG tablet Take 1 tablet (8 mg total) by mouth every 8 (eight) hours as needed for nausea or vomiting. 30 tablet 1   OneTouch Delica Lancets 30G MISC USE ONCE A DAY WHEN CHECKING BLOOD SUGAR 100 each 3   ONETOUCH VERIO test strip USE ONCE A  DAY TO TEST BLOOD SUGAR 100 strip 1   pyridOXINE (VITAMIN B-6) 100 MG tablet Take 100 mg by mouth daily.     sodium bicarbonate  650 MG tablet TAKE 1 TABLET BY MOUTH 2 TIMES DAILY. 180 tablet 1   SUMAtriptan  (IMITREX ) 100 MG tablet TAKE 1 TABLET (100 MG TOTAL) BY MOUTH AS NEEDED FOR MIGRAINE. MAY REPEAT IN 2 HOURS IF HEADACHE PERSISTS OR RECURS. 9 tablet 6   SYNTHROID  137 MCG tablet TAKE 1 TABLET BY MOUTH DAILY BEFORE BREAKFAST. 90  tablet 3   traZODone  (DESYREL ) 100 MG tablet TAKE 1 TABLET BY MOUTH EVERYDAY AT BEDTIME 90 tablet 3   No current facility-administered medications for this visit.    PHYSICAL EXAMINATION: ECOG PERFORMANCE STATUS: 0 - Asymptomatic VITALS: Vitals:   12/11/24 1119  BP: 123/70  Pulse: 67  Resp: 20  Temp: 97.7 F (36.5 C)  SpO2: 99%   Filed Weights   12/11/24 1119  Weight: 190 lb 6.4 oz (86.4 kg)   Body mass index is 26.56 kg/m.  GENERAL: alert, in no acute distress and comfortable SKIN: no acute rashes, no significant lesions EYES: conjunctiva are pink and non-injected, sclera anicteric OROPHARYNX: MMM, no exudates, no oropharyngeal erythema or ulceration NECK: supple, no JVD LYMPH:  no palpable lymphadenopathy in the cervical, axillary or inguinal regions LUNGS: clear to auscultation b/l with normal respiratory effort HEART: regular rate & rhythm ABDOMEN:  normoactive bowel sounds , non tender, not distended, no hepatosplenomegaly Extremity: no pedal edema PSYCH: alert & oriented x 3 with fluent speech NEURO: no focal motor/sensory deficits  LABORATORY DATA:   I have reviewed the data as listed     Latest Ref Rng & Units 12/11/2024   10:32 AM 12/04/2024    8:24 AM 11/28/2024    7:35 AM  CBC EXTENDED  WBC 4.0 - 10.5 K/uL 7.1  5.6  5.8   RBC 4.22 - 5.81 MIL/uL 3.62  3.61  3.45   Hemoglobin 13.0 - 17.0 g/dL 88.5  88.3  89.0   HCT 39.0 - 52.0 % 33.9  33.5  32.1   Platelets 150 - 400 K/uL 173  197  192   NEUT# 1.7 - 7.7 K/uL 3.7  3.2  2.6    Lymph# 0.7 - 4.0 K/uL 2.5  1.8  2.3         Latest Ref Rng & Units 12/04/2024    8:25 AM 11/14/2024   11:55 AM 11/06/2024    1:58 PM  CMP  Glucose 70 - 99 mg/dL 798  874  866   BUN 8 - 23 mg/dL 33  48  57   Creatinine 0.61 - 1.24 mg/dL 8.10  7.86  7.91   Sodium 135 - 145 mmol/L 135  135  132   Potassium 3.5 - 5.1 mmol/L 4.4  3.8  4.8   Chloride 98 - 111 mmol/L 105  105  105   CO2 22 - 32 mmol/L 21  21  18    Calcium  8.9 - 10.3 mg/dL 8.7  8.8  8.5   Total Protein 6.5 - 8.1 g/dL 8.1  8.7  8.2   Total Bilirubin 0.0 - 1.2 mg/dL 0.8  0.6  0.4   Alkaline Phos 38 - 126 U/L 72  71  66   AST 15 - 41 U/L 10  12  13    ALT 0 - 44 U/L 12  14  17      MULTIPLE MYELOMA    Component     Latest Ref Rng 11/20/2024  IgG (Immunoglobin G), Serum     603 - 1,613 mg/dL 6,692 (H)   IgA     61 - 437 mg/dL 13 (L)   IgM (Immunoglobulin M), Srm     15 - 143 mg/dL 12 (L)   Total Protein ELP     6.0 - 8.5 g/dL 7.3 (C)  Albumin SerPl Elph-Mcnc     2.9 - 4.4 g/dL 2.9 (C)  Alpha 1     0.0 - 0.4 g/dL 0.4 (C)  Alpha2 Glob SerPl Elph-Mcnc     0.4 - 1.0 g/dL 1.0 (C)  B-Globulin SerPl Elph-Mcnc     0.7 - 1.3 g/dL 0.8 (C)  Gamma Glob SerPl Elph-Mcnc     0.4 - 1.8 g/dL 2.2 (H) (C)  M Protein SerPl Elph-Mcnc     Not Observed g/dL 2.1 (H) (C)  Globulin, Total     2.2 - 3.9 g/dL 4.4 (H) (C)  Albumin/Glob SerPl     0.7 - 1.7  0.7 (C)  IFE 1 Comment ! (C)  Please Note (HCV): Comment (C)    Legend: (H) High (L) Low ! Abnormal (C) Corrected  KAPPA/LAMBDA LIGHT CHAINS  Component     Latest Ref Rng 11/20/2024  Kappa free light chain     3.3 - 19.4 mg/L 4.3   Lambda free light chains     5.7 - 26.3 mg/L 86.9 (H)   Kappa, lambda light chain ratio     0.26 - 1.65  0.05 (L)     Legend: (H) High (L) Low   Surgical Pathology 10/12/2023  CASE: [id number] PATIENT: Jaxyn Dement Bone Marrow Report   Clinical History: Smoldering Myleoma  DIAGNOSIS:  BONE MARROW, ASPIRATE, CLOT,  CORE: -Hypercellular bone marrow with plasma cell neoplasm -See comment  PERIPHERAL BLOOD: -Macrocytic anemia -Leukopenia  COMMENT:  The bone marrow is hypercellular for age with increased number of plasma cells representing 23% of all cells in the aspirate associated with interstitial infiltrates and numerous variably sized clusters in the clot and biopsy sections.  The plasma cells display lambda light chain restriction consistent with plasma cell neoplasm.  The background shows trilineage hematopoiesis with generally nonspecific changes particularly involving megakaryocytes.  Correlation with cytogenetic and FISH studies is recommended.  MICROSCOPIC DESCRIPTION:  PERIPHERAL BLOOD SMEAR: The red blood cells display mild anisopoikilocytosis with mild polychromasia.  The white blood cells are decreased number but with no significant morphologic abnormalities.  The platelets are normal in number.  BONE MARROW ASPIRATE: Bone marrow particles present Erythroid precursors: Orderly and progressive maturation for the most part.  Occasional late precursors display nuclear cytoplasmic dyssynchrony. Granulocytic precursors: Orderly and progressive maturation Megakaryocytes: Abundant with scattered large, atypical forms Lymphocytes/plasma cells: The plasma cells are increased in number representing 23% of all cells but with lack of large aggregates or sheets.  Some of the plasma cells display atypical features with cytomegaly and/or small nucleoli.  Large lymphoid aggregates are not seen.  TOUCH PREPARATIONS: A mixture of cell types present including increased number of plasma cells  CLOT AND BIOPSY: The sections show cellularity ranging from 40 to 70% with interstitial infiltrates and numerous variably sized clusters of plasma cells in a background of trilineage hematopoiesis.  Significant lymphoid aggregates are not seen.  Immunohistochemical stain for CD138 and in situ  hybridization for kappa and lambda were performed on blocks B1 and C1 with appropriate controls.  CD138 highlights the plasma cell component which displays lambda light chain restriction.  IRON STAIN: Iron stains are performed on a bone marrow aspirate or touch imprint smear and section of clot. The controls stained appropriately.       Storage Iron: Present      Ring Sideroblasts: Absent  ADDITIONAL DATA/TESTING: The bone marrow specimen was sent for cytogenetic analysis and FISH for multiple myeloma and a separate report will follow.  CELL COUNT DATA:  Bone Marrow count performed on 500 cells shows: Blasts:   0%   Myeloid:  41% Promyelocytes: 1%   Erythroid:  26% Myelocytes:    5%   Lymphocytes:   8% Metamyelocytes:     3%   Plasma cells:  23% Bands:    9% Neutrophils:   20%  M:E ratio:     1.6 Eosinophils:   3% Basophils:     0% Monocytes:     2%  Lab Data: CBC performed on 10/12/2023 shows: WBC: 3.6 k/uL  Neutrophils:   47% Hgb: 11.5 g/dL Lymphocytes:   60% HCT: 37.1 %    Monocytes:     13% MCV: 101.9 fL  Eosinophils:   1% RDW: 14.4 %    Basophils:     0% PLT: 191 k/uL  GROSS DESCRIPTION:  A.  BM-aspirate smear  B.  Received in B-plus fixative is a 1.2 x 0.8 x 0.2 cm aggregate of tissue.  Submitted entirely in B1.  C.  Received in B-plus fixative are 4 cores of firm bone ranging from 0.7-1.1 cm in length and each measuring 0.2 cm in diameter.  Submitted entirely in C1, following decalcification in Immunocal. (KW, 10/12/2023)     10/12/2023    10/12/2023    Surgical Pathology 03/27/2021  CASE: [id number]  PATIENT: Qasim Ornstein  Bone Marrow Report   Clinical History: Smoldering Myleoma   DIAGNOSIS:   BONE MARROW, ASPIRATE, CLOT, CORE:  -Hypercellular bone marrow with plasma cell neoplasm  -See comment   PERIPHERAL BLOOD:  -Macrocytic anemia  -Leukopenia   COMMENT:   The bone marrow is hypercellular for age with increased number of  plasma  cells representing 23% of all cells in the aspirate associated with  interstitial infiltrates and numerous variably sized clusters in the  clot and biopsy sections.  The plasma cells display lambda light chain  restriction consistent with plasma cell neoplasm.  The background shows  trilineage hematopoiesis with generally nonspecific changes particularly  involving megakaryocytes.  Correlation with cytogenetic and FISH studies  is recommended.     03/27/2021 Molecular Pathology     03/27/2021 Cytogenetics     RADIOGRAPHIC STUDIES: I have personally reviewed the radiological images as listed and agreed with the findings in the report. No results found.  ASSESSMENT & PLAN:  84 y.o. male with  1.  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    2. 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 Gearline  -mild non nephrotic proteinuria on 24h UPEP-Continue follow up with Dr Gearline for CKD     3. Vitamin B12 deficient  Antibody testing done - no evidence of pernicious anemia. No evidence of pernicious anemia based on neg IF and parietal cell ab -continue B12 replacement per PCP   4.      Patient Active Problem List    Diagnosis Date Noted   Smoldering myeloma 10/10/2024   CKD (chronic kidney disease) stage 3, GFR 30-59 ml/min (HCC) 11/02/2023   Insomnia 03/16/2023   COVID-19 virus infection 12/29/2021   Dyslipidemia 02/22/2018   Hypothyroidism 02/22/2018   Diabetes mellitus without complication (HCC) 08/14/2014   PROSTATE CANCER, HX OF 11/13/2010   GERD 01/17/2008   Acute peptic ulcer with hemorrhage 01/17/2008   ERECTILE DYSFUNCTION 11/23/2007   HYPERTENSION, BENIGN 11/23/2007   PROSTATE SPECIFIC ANTIGEN,  ELEVATED 11/23/2007   Migraine  headache 06/13/2007     PLAN: - Discussed lab results on 12/11/2024 in detail with patient: - CMP  (10/2024) - Creatinine:  1.89 - Calcium : 8.7 - CBC  - Hemoglobin: 11.4  - WBC: 7.1  - Platelets: 173  - Multiple Myeloma Panel  - M protein: 2.1on 11/20/2024  down from 3  No notable toxicities from Dara faspro at this time.  He notes he up to date with recommended vaccinations.  Continue Dara faspro per integrated scheduling with the same supportive medications.  MD visit in 4 weeks  FOLLOW-UP in 4-6 weeks for labs and follow-up with Dr. Onesimo.  The total time spent in the appointment was 30 minutes* .  All of the patient's questions were answered and the patient knows to call the clinic with any problems, questions, or concerns.  Emaline Onesimo MD MS AAHIVMS [facility] Hematology/Oncology Physician [facility]  *Total Encounter Time as defined by the Centers for Medicare and Medicaid Services includes, in addition to the face-to-face time of a patient visit (documented in the note above) non-face-to-face time: obtaining and reviewing outside history, ordering and reviewing medications, tests or procedures, care coordination (communications with other health care professionals or caregivers) and documentation in the medical record.  I, Marijo Sharps, acting as a neurosurgeon for Emaline Onesimo, MD.,have documented all relevant documentation on the behalf of Emaline Onesimo, MD,as directed by  Emaline Onesimo, MD while in the presence of Emaline Onesimo, MD.  I have reviewed the above documentation for accuracy and completeness, and I agree with the above.  Emaline Onesimo, MD      [1]  Social History Tobacco Use   Smoking status: Former    Current packs/day: 0.00    Average packs/day: 0.3 packs/day for 5.0 years (1.3 ttl pk-yrs)    Types: Cigarettes    Start date: 4    Quit date: 64    Years since quitting: 59.0   Smokeless tobacco: Never  Vaping  Use   Vaping status: Never Used  Substance Use Topics   Alcohol use: Not Currently    Comment: rare one glass every 6 months   Drug use: No   "

## 2024-12-12 ENCOUNTER — Other Ambulatory Visit: Payer: Self-pay | Admitting: Family Medicine

## 2024-12-13 ENCOUNTER — Other Ambulatory Visit: Payer: Self-pay

## 2024-12-15 ENCOUNTER — Telehealth: Payer: Self-pay | Admitting: Hematology

## 2024-12-15 NOTE — Telephone Encounter (Signed)
 I received a voicemail from this patient needing to confirm some appointments. I called and spoke with him, he said he spoke with someone and got the information that he needed. I asked him if he checks MyChart and he verified that he does, but I did offer to mail his appointments to him and he told me that would work. I am mailing a calendar to him with all his treatment appointments. He is aware.

## 2024-12-17 ENCOUNTER — Encounter: Payer: Self-pay | Admitting: Hematology

## 2024-12-18 ENCOUNTER — Inpatient Hospital Stay

## 2024-12-18 VITALS — BP 133/72 | HR 79 | Temp 98.2°F | Resp 18

## 2024-12-18 DIAGNOSIS — C9000 Multiple myeloma not having achieved remission: Secondary | ICD-10-CM

## 2024-12-18 DIAGNOSIS — D472 Monoclonal gammopathy: Secondary | ICD-10-CM

## 2024-12-18 DIAGNOSIS — Z5112 Encounter for antineoplastic immunotherapy: Secondary | ICD-10-CM | POA: Diagnosis not present

## 2024-12-18 LAB — CMP (CANCER CENTER ONLY)
ALT: 19 U/L (ref 0–44)
AST: 13 U/L — ABNORMAL LOW (ref 15–41)
Albumin: 3.5 g/dL (ref 3.5–5.0)
Alkaline Phosphatase: 84 U/L (ref 38–126)
Anion gap: 9 (ref 5–15)
BUN: 34 mg/dL — ABNORMAL HIGH (ref 8–23)
CO2: 26 mmol/L (ref 22–32)
Calcium: 8.8 mg/dL — ABNORMAL LOW (ref 8.9–10.3)
Chloride: 100 mmol/L (ref 98–111)
Creatinine: 1.88 mg/dL — ABNORMAL HIGH (ref 0.61–1.24)
GFR, Estimated: 35 mL/min — ABNORMAL LOW
Glucose, Bld: 234 mg/dL — ABNORMAL HIGH (ref 70–99)
Potassium: 4.6 mmol/L (ref 3.5–5.1)
Sodium: 135 mmol/L (ref 135–145)
Total Bilirubin: 0.5 mg/dL (ref 0.0–1.2)
Total Protein: 8.2 g/dL — ABNORMAL HIGH (ref 6.5–8.1)

## 2024-12-18 LAB — CBC WITH DIFFERENTIAL (CANCER CENTER ONLY)
Abs Immature Granulocytes: 0.03 K/uL (ref 0.00–0.07)
Basophils Absolute: 0 K/uL (ref 0.0–0.1)
Basophils Relative: 0 %
Eosinophils Absolute: 0.1 K/uL (ref 0.0–0.5)
Eosinophils Relative: 1 %
HCT: 33.5 % — ABNORMAL LOW (ref 39.0–52.0)
Hemoglobin: 11.5 g/dL — ABNORMAL LOW (ref 13.0–17.0)
Immature Granulocytes: 0 %
Lymphocytes Relative: 27 %
Lymphs Abs: 1.8 K/uL (ref 0.7–4.0)
MCH: 32.5 pg (ref 26.0–34.0)
MCHC: 34.3 g/dL (ref 30.0–36.0)
MCV: 94.6 fL (ref 80.0–100.0)
Monocytes Absolute: 0.7 K/uL (ref 0.1–1.0)
Monocytes Relative: 10 %
Neutro Abs: 4.1 K/uL (ref 1.7–7.7)
Neutrophils Relative %: 62 %
Platelet Count: 189 K/uL (ref 150–400)
RBC: 3.54 MIL/uL — ABNORMAL LOW (ref 4.22–5.81)
RDW: 14.5 % (ref 11.5–15.5)
WBC Count: 6.7 K/uL (ref 4.0–10.5)
nRBC: 0 % (ref 0.0–0.2)

## 2024-12-18 MED ORDER — DARATUMUMAB-HYALURONIDASE-FIHJ 1800-30000 MG-UT/15ML ~~LOC~~ SOLN
1800.0000 mg | Freq: Once | SUBCUTANEOUS | Status: AC
  Administered 2024-12-18: 1800 mg via SUBCUTANEOUS
  Filled 2024-12-18: qty 15

## 2024-12-18 MED ORDER — DIPHENHYDRAMINE HCL 25 MG PO CAPS
50.0000 mg | ORAL_CAPSULE | Freq: Once | ORAL | Status: AC
  Administered 2024-12-18: 50 mg via ORAL
  Filled 2024-12-18: qty 2

## 2024-12-18 MED ORDER — ACETAMINOPHEN 325 MG PO TABS
650.0000 mg | ORAL_TABLET | Freq: Once | ORAL | Status: AC
  Administered 2024-12-18: 650 mg via ORAL
  Filled 2024-12-18: qty 2

## 2024-12-18 MED ORDER — FAMOTIDINE 20 MG PO TABS
20.0000 mg | ORAL_TABLET | Freq: Once | ORAL | Status: AC
  Administered 2024-12-18: 20 mg via ORAL
  Filled 2024-12-18: qty 1

## 2024-12-18 NOTE — Patient Instructions (Addendum)

## 2024-12-19 ENCOUNTER — Other Ambulatory Visit (HOSPITAL_COMMUNITY): Payer: Self-pay

## 2024-12-19 LAB — KAPPA/LAMBDA LIGHT CHAINS
Kappa free light chain: 2.2 mg/L — ABNORMAL LOW (ref 3.3–19.4)
Kappa, lambda light chain ratio: 0.02 — ABNORMAL LOW (ref 0.26–1.65)
Lambda free light chains: 97 mg/L — ABNORMAL HIGH (ref 5.7–26.3)

## 2024-12-20 LAB — MULTIPLE MYELOMA PANEL, SERUM
Albumin SerPl Elph-Mcnc: 3.2 g/dL (ref 2.9–4.4)
Albumin/Glob SerPl: 0.8 (ref 0.7–1.7)
Alpha 1: 0.2 g/dL (ref 0.0–0.4)
Alpha2 Glob SerPl Elph-Mcnc: 1 g/dL (ref 0.4–1.0)
B-Globulin SerPl Elph-Mcnc: 0.7 g/dL (ref 0.7–1.3)
Gamma Glob SerPl Elph-Mcnc: 2.3 g/dL — ABNORMAL HIGH (ref 0.4–1.8)
Globulin, Total: 4.2 g/dL — ABNORMAL HIGH (ref 2.2–3.9)
IgA: 10 mg/dL — ABNORMAL LOW (ref 61–437)
IgG (Immunoglobin G), Serum: 2876 mg/dL — ABNORMAL HIGH (ref 603–1613)
IgM (Immunoglobulin M), Srm: 12 mg/dL — ABNORMAL LOW (ref 15–143)
M Protein SerPl Elph-Mcnc: 2.1 g/dL — ABNORMAL HIGH
Total Protein ELP: 7.4 g/dL (ref 6.0–8.5)

## 2025-01-01 ENCOUNTER — Inpatient Hospital Stay: Admitting: Hematology

## 2025-01-01 ENCOUNTER — Inpatient Hospital Stay

## 2025-01-04 ENCOUNTER — Inpatient Hospital Stay

## 2025-01-04 ENCOUNTER — Inpatient Hospital Stay: Admitting: Hematology

## 2025-01-04 ENCOUNTER — Inpatient Hospital Stay: Attending: Hematology

## 2025-01-04 ENCOUNTER — Ambulatory Visit: Admitting: Family Medicine

## 2025-01-04 DIAGNOSIS — C9000 Multiple myeloma not having achieved remission: Secondary | ICD-10-CM

## 2025-01-04 DIAGNOSIS — Z0000 Encounter for general adult medical examination without abnormal findings: Secondary | ICD-10-CM | POA: Diagnosis not present

## 2025-01-04 DIAGNOSIS — D472 Monoclonal gammopathy: Secondary | ICD-10-CM

## 2025-01-04 LAB — CMP (CANCER CENTER ONLY)
ALT: 15 U/L (ref 0–44)
AST: 13 U/L — ABNORMAL LOW (ref 15–41)
Albumin: 3.6 g/dL (ref 3.5–5.0)
Alkaline Phosphatase: 99 U/L (ref 38–126)
Anion gap: 8 (ref 5–15)
BUN: 27 mg/dL — ABNORMAL HIGH (ref 8–23)
CO2: 25 mmol/L (ref 22–32)
Calcium: 9.1 mg/dL (ref 8.9–10.3)
Chloride: 104 mmol/L (ref 98–111)
Creatinine: 1.89 mg/dL — ABNORMAL HIGH (ref 0.61–1.24)
GFR, Estimated: 35 mL/min — ABNORMAL LOW
Glucose, Bld: 148 mg/dL — ABNORMAL HIGH (ref 70–99)
Potassium: 4.2 mmol/L (ref 3.5–5.1)
Sodium: 137 mmol/L (ref 135–145)
Total Bilirubin: 0.7 mg/dL (ref 0.0–1.2)
Total Protein: 9 g/dL — ABNORMAL HIGH (ref 6.5–8.1)

## 2025-01-04 LAB — CBC WITH DIFFERENTIAL (CANCER CENTER ONLY)
Abs Immature Granulocytes: 0.01 10*3/uL (ref 0.00–0.07)
Basophils Absolute: 0 10*3/uL (ref 0.0–0.1)
Basophils Relative: 0 %
Eosinophils Absolute: 0.1 10*3/uL (ref 0.0–0.5)
Eosinophils Relative: 2 %
HCT: 34.3 % — ABNORMAL LOW (ref 39.0–52.0)
Hemoglobin: 11.6 g/dL — ABNORMAL LOW (ref 13.0–17.0)
Immature Granulocytes: 0 %
Lymphocytes Relative: 55 %
Lymphs Abs: 2.7 10*3/uL (ref 0.7–4.0)
MCH: 31.9 pg (ref 26.0–34.0)
MCHC: 33.8 g/dL (ref 30.0–36.0)
MCV: 94.2 fL (ref 80.0–100.0)
Monocytes Absolute: 0.5 10*3/uL (ref 0.1–1.0)
Monocytes Relative: 10 %
Neutro Abs: 1.6 10*3/uL — ABNORMAL LOW (ref 1.7–7.7)
Neutrophils Relative %: 33 %
Platelet Count: 195 10*3/uL (ref 150–400)
RBC: 3.64 MIL/uL — ABNORMAL LOW (ref 4.22–5.81)
RDW: 14.3 % (ref 11.5–15.5)
WBC Count: 4.8 10*3/uL (ref 4.0–10.5)
nRBC: 0 % (ref 0.0–0.2)

## 2025-01-04 MED ORDER — ACETAMINOPHEN 325 MG PO TABS
650.0000 mg | ORAL_TABLET | Freq: Once | ORAL | Status: AC
  Administered 2025-01-04: 650 mg via ORAL
  Filled 2025-01-04: qty 2

## 2025-01-04 MED ORDER — DIPHENHYDRAMINE HCL 25 MG PO CAPS
50.0000 mg | ORAL_CAPSULE | Freq: Once | ORAL | Status: AC
  Administered 2025-01-04: 50 mg via ORAL
  Filled 2025-01-04: qty 2

## 2025-01-04 MED ORDER — DARATUMUMAB-HYALURONIDASE-FIHJ 1800-30000 MG-UT/15ML ~~LOC~~ SOLN
1800.0000 mg | Freq: Once | SUBCUTANEOUS | Status: AC
  Administered 2025-01-04: 1800 mg via SUBCUTANEOUS
  Filled 2025-01-04: qty 15

## 2025-01-04 MED ORDER — FAMOTIDINE 20 MG PO TABS
20.0000 mg | ORAL_TABLET | Freq: Once | ORAL | Status: AC
  Administered 2025-01-04: 20 mg via ORAL
  Filled 2025-01-04: qty 1

## 2025-01-04 NOTE — Progress Notes (Signed)
 "  ----------------------------------------------------------------------------------------------------------------------------------------------------------------------------------------------------------------------  Because this visit was a virtual/telehealth visit, some criteria may be missing or patient reported. Any vitals not documented were not able to be obtained and vitals that have been documented are patient reported.    MEDICARE ANNUAL PREVENTIVE CARE VISIT WITH PROVIDER (Welcome to Medicare, initial annual wellness or annual wellness exam)  Virtual Visit via Video Note  I connected with Troy Howard on 01/04/25  by a video enabled telemedicine application and verified that I am speaking with the correct person using two identifiers.  Location patient: infusion center Location provider:work or home office Persons participating in the virtual visit: patient, provider  Concerns and/or follow up today: detailed intake and health/risks assessment completed on flow sheets and below- please see for details.   How often do you have a drink containing alcohol?n How many drinks containing alcohol do you have on a typical day when you are drinking?na How often do you have six or more drinks on one occasion?na Have you ever smoked?y Quit date if applicable? 1967  How many packs a day do/did you smoke? na Do you use smokeless tobacco?na Do you use an illicit drugs?n Do you feel safe at home?y Last dentist visit?goes every 6 months Last eye Exam and location? Will be getting cataract surgery Tuesday   See HM section in Epic for other details of completed HM.    ROS: negative for report of fevers, unintentional weight loss, vision changes, vision loss, hearing loss or change, chest pain, sob, hemoptysis, melena, hematochezia, hematuria,bleeding or bruising  Patient-completed extensive health risk assessment - reviewed and discussed with the patient: See Health Risk Assessment  completed with patient prior to the visit either above or in recent phone note. This was reviewed in detailed with the patient today and appropriate recommendations, orders and referrals were placed as needed per Summary below and patient instructions.   Review of Medical History: -PMH, PSH, Family History and current specialty and care providers reviewed and updated and listed below   Patient Care Team: Johnny Garnette LABOR, MD as PCP - General (Family Medicine) Lionell Jon DEL, [facility] (Pharmacist)   Past Medical History:  Diagnosis Date   Arthritis    back   Cancer [facility])    prostate, sees Dr. Gretel Ferrara    Chronic kidney disease 2017   stage 3 kidney disease   Diabetes mellitus    Erectile dysfunction    GERD (gastroesophageal reflux disease)    Hyperlipidemia    Hypertension    Migraines    Peptic ulcer    Thyroid  disease     Past Surgical History:  Procedure Laterality Date   APPENDECTOMY  1950   COLONOSCOPY  05/22/2021   per Dr. Aneita, benign polyps, no repeats needed   HERNIA REPAIR     umbilical    MENISCUS REPAIR Left    PROSTATE SURGERY  2009   radical prostatectomy   SPINE SURGERY     L4-L5 fusion per Dr. Oneil Carwin    TONSILLECTOMY AND ADENOIDECTOMY  1954    Social History   Socioeconomic History   Marital status: Married    Spouse name: Not on file   Number of children: Not on file   Years of education: Not on file   Highest education level: Some college, no degree  Occupational History   Not on file  Tobacco Use   Smoking status: Former    Current packs/day: 0.00    Average packs/day: 0.3 packs/day for 5.0  years (1.3 ttl pk-yrs)    Types: Cigarettes    Start date: 6    Quit date: 67    Years since quitting: 59.1   Smokeless tobacco: Never  Vaping Use   Vaping status: Never Used  Substance and Sexual Activity   Alcohol use: Not Currently    Comment: rare one glass every 6 months   Drug use: No   Sexual activity: Yes  Other Topics Concern    Not on file  Social History Narrative   Not on file   Social Drivers of Health   Tobacco Use: Medium Risk (09/11/2024)   Patient History    Smoking Tobacco Use: Former    Smokeless Tobacco Use: Never    Passive Exposure: Not on Actuary Strain: Low Risk (07/14/2022)   Overall Financial Resource Strain (CARDIA)    Difficulty of Paying Living Expenses: Not hard at all  Food Insecurity: No Food Insecurity (03/15/2023)   Hunger Vital Sign    Worried About Running Out of Food in the Last Year: Never true    Ran Out of Food in the Last Year: Never true  Transportation Needs: No Transportation Needs (03/15/2023)   PRAPARE - Administrator, Civil Service (Medical): No    Lack of Transportation (Non-Medical): No  Physical Activity: Sufficiently Active (01/04/2025)   Exercise Vital Sign    Days of Exercise per Week: 4 days    Minutes of Exercise per Session: 40 min  Stress: No Stress Concern Present (01/04/2025)   [employer] of Occupational Health - Occupational Stress Questionnaire    Feeling of Stress: Not at all  Social Connections: Moderately Isolated (01/04/2025)   Social Connection and Isolation Panel    Frequency of Communication with Friends and Family: More than three times a week    Frequency of Social Gatherings with Friends and Family: Twice a week    Attends Religious Services: Never    Database Administrator or Organizations: No    Attends Banker Meetings: Never    Marital Status: Married  Catering Manager Violence: Not At Risk (01/15/2022)   Humiliation, Afraid, Rape, and Kick questionnaire    Fear of Current or Ex-Partner: No    Emotionally Abused: No    Physically Abused: No    Sexually Abused: No  Depression (PHQ2-9): Low Risk (01/04/2025)   Depression (PHQ2-9)    PHQ-2 Score: 0  Alcohol Screen: Low Risk (01/15/2022)   Alcohol Screen    Last Alcohol Screening Score (AUDIT): 0  Housing: Low Risk (03/15/2023)   Housing     Last Housing Risk Score: 0  Utilities: Not on file  Health Literacy: Not on file    Family History  Problem Relation Age of Onset   Hypertension Other    Cancer Other    Colon cancer Neg Hx    Colon polyps Neg Hx    Esophageal cancer Neg Hx    Rectal cancer Neg Hx    Stomach cancer Neg Hx     Medications Ordered Prior to Encounter[1]  Allergies[2]     Physical Exam Vitals requested from patient and listed below if patient had equipment and was able to obtain at home for this virtual visit: There were no vitals filed for this visit. Estimated body mass index is 26.61 kg/m as calculated from the following:   Height as of 11/20/24: 5' 11 (1.803 m).   Weight as of an earlier encounter on 01/04/25: 190 lb  12.8 oz (86.5 kg).  EKG (optional): deferred due to virtual visit  GENERAL: alert, oriented, no acute distress detected; full vision exam deferred due to pandemic and/or virtual encounter  HEENT: atraumatic, conjunttiva clear, no obvious abnormalities on inspection of external nose and ears  NECK: normal movements of the head and neck  LUNGS: on inspection no signs of respiratory distress, breathing rate appears normal, no obvious gross SOB, gasping or wheezing  CV: no obvious cyanosis  MS: moves all visible extremities without noticeable abnormality  PSYCH/NEURO: pleasant and cooperative, no obvious depression or anxiety, speech and thought processing grossly intact, Cognitive function grossly intact  Flowsheet Row Office Visit from 03/16/2023 in [facility] [place]  PHQ-9 Total Score 3        01/04/2025   10:15 AM 01/04/2025    9:00 AM 12/18/2024    3:00 PM 12/11/2024   12:05 PM 11/28/2024    8:36 AM  Depression screen PHQ 2/9  Decreased Interest 0 0 0 0 0  Down, Depressed, Hopeless 0 0 0 0 0  PHQ - 2 Score 0 0 0 0 0       01/19/2023    4:05 PM 03/15/2023    1:53 PM 03/16/2023    8:41 AM 01/25/2024   10:48 AM 01/04/2025   10:06 AM  Fall  Risk  Falls in the past year? 0 0  0 0 0  Was there an injury with Fall? 0   0  0  0  Fall Risk Category Calculator 0  0 0 0  Patient at Risk for Falls Due to   No Fall Risks  Other (Comment)  Fall risk Follow up   Falls evaluation completed Falls evaluation completed;Education provided Falls evaluation completed     Manually entered by patient   Data saved with a previous flowsheet row definition     SUMMARY AND PLAN:  Encounter for Medicare annual wellness exam  Discussed applicable health maintenance/preventive health measures and advised and referred or ordered per patient preferences: -he declines covid vaccines -he agrees to schedule his yearly follow up visit with Dr. Johnny and do labs and foot exam then Health Maintenance  Topic Date Due   FOOT EXAM  10/23/2020   HEMOGLOBIN A1C  05/02/2024   Diabetic kidney evaluation - Urine ACR  12/28/2024   COVID-19 Vaccine (5 - 2025-26 season) 01/20/2025 (Originally 07/31/2024)   OPHTHALMOLOGY EXAM  08/09/2025   Diabetic kidney evaluation - eGFR measurement  01/04/2026   Medicare Annual Wellness (AWV)  01/04/2026   Pneumococcal Vaccine: 50+ Years  Completed   Influenza Vaccine  Completed   Zoster Vaccines- Shingrix  Completed   Meningococcal B Vaccine  Aged Out   DTaP/Tdap/Td  Discontinued   Colonoscopy  Discontinued     Education and counseling on the following was provided based on the above review of health and a plan/checklist for the patient, along with additional information discussed, was provided for the patient in the patient instructions :   -Advised and counseled on a healthy lifestyle - including the importance of a healthy diet, regular physical activity, social connections and stress management. -Reviewed patient's current diet. Advised and counseled on a whole foods based healthy diet. A summary of a healthy diet was provided in the Patient Instructions.  -reviewed patient's current physical activity level and  discussed exercise guidelines for adults. Discussed community resources and ideas for safe exercise at home to assist in meeting exercise guideline recommendations in a safe  and healthy way.  -Advise yearly dental visits at minimum and regular eye exams   Follow up: see patient instructions   Patient Instructions  I really enjoyed getting to talk with you today! I am available on Tuesdays and Thursdays for virtual visits if you have any questions or concerns, or if I can be of any further assistance.   CHECKLIST FROM ANNUAL WELLNESS VISIT:  -Follow up (please call to schedule if not scheduled after visit):   -call to schedule your annual exam with Dr. Johnny for labs and foot exam   -yearly for annual wellness visit with primary care office  Here is a list of your preventive care/health maintenance measures and the plan for each if any are due:  PLAN For any measures below that may be due:    1. Can get labs and foot exam at your in office appointment.   Health Maintenance  Topic Date Due   FOOT EXAM  10/23/2020   HEMOGLOBIN A1C  05/02/2024   Diabetic kidney evaluation - Urine ACR  12/28/2024   Medicare Annual Wellness (AWV)  01/24/2025   COVID-19 Vaccine (5 - 2025-26 season) 01/20/2025 (Originally 07/31/2024)   OPHTHALMOLOGY EXAM  08/09/2025   Diabetic kidney evaluation - eGFR measurement  01/04/2026   Pneumococcal Vaccine: 50+ Years  Completed   Influenza Vaccine  Completed   Zoster Vaccines- Shingrix  Completed   Meningococcal B Vaccine  Aged Out   DTaP/Tdap/Td  Discontinued   Colonoscopy  Discontinued    -See a dentist at least yearly  -Get your eyes checked and then per your eye specialist's recommendations  -Other issues addressed today:   -I have included below further information regarding a healthy whole foods based diet, physical activity guidelines for adults, stress management and opportunities for social connections. I hope you find this information useful.    -----------------------------------------------------------------------------------------------------------------------------------------------------------------------------------------------------------------------------------------------------------    NUTRITION: -eat real food: lots of colorful vegetables (half the plate) and fruits -5-7 servings of vegetables and fruits per day (fresh or steamed is best), exp. 2 servings of vegetables with lunch and dinner and 2 servings of fruit per day. Berries and greens such as kale and collards are great choices.  -consume on a regular basis:  fresh fruits, fresh veggies, fish, nuts, seeds, healthy oils (such as olive oil, avocado oil), whole grains (make sure for bread/pasta/crackers/etc., that the first ingredient on label contains the word whole), legumes. -can eat small amounts of dairy and lean meat (no larger than the palm of your hand), but avoid processed meats such as ham, bacon, lunch meat, etc. -drink water -try to avoid fast food and pre-packaged foods, processed meat, ultra processed foods/beverages (donuts, candy, etc.) -most experts advise limiting sodium to < 2300mg  per day, should limit further is any chronic conditions such as high blood pressure, heart disease, diabetes, etc. The American Heart Association advised that < 1500mg  is is ideal -try to avoid foods/beverages that contain any ingredients with names you do not recognize  -try to avoid foods/beverages  with added sugar or sweeteners/sweets  -try to avoid sweet drinks (including diet drinks): soda, juice, Gatorade, sweet tea, power drinks, diet drinks -try to avoid white rice, white bread, pasta (unless whole grain)  EXERCISE GUIDELINES FOR ADULTS: -if you wish to increase your physical activity, do so gradually and with the approval of your doctor -STOP and seek medical care immediately if you have any chest pain, chest discomfort or trouble breathing when starting or  increasing exercise  -move  and stretch your body, legs, feet and arms when sitting for long periods -Physical activity guidelines for optimal health in adults: -get at least 150 minutes per week of moderate exercise (can talk, but not sing); this is about 20-30 minutes of sustained activity 5-7 days per week or two 10-15 minute episodes of sustained activity 5-7 days per week -do some muscle building/resistance training/strength training at least 2 days per week  -balance exercises 3+ days per week:   Stand somewhere where you have something sturdy to hold onto if you lose balance    1) lift up on toes, then back down, start with 5x per day and work up to 20x   2) stand and lift one leg straight out to the side so that foot is a few inches of the floor, start with 5x each side and work up to 20x each side   3) stand on one foot, start with 5 seconds each side and work up to 20 seconds on each side  If you need ideas or help with getting more active:  -Silver sneakers https://tools.silversneakers.com  -Walk with a Doc: Http://www.duncan-williams.com/  -try to include resistance (weight lifting/strength building) and balance exercises twice per week: or the following link for ideas: http://castillo-powell.com/  buyducts.dk  STRESS MANAGEMENT: -can try meditating, or just sitting quietly with deep breathing while intentionally relaxing all parts of your body for 5 minutes daily -if you need further help with stress, anxiety or depression please follow up with your primary doctor or contact the wonderful folks at [facility]: [phone]  SOCIAL CONNECTIONS: -options in [place] if you wish to engage in more social and exercise related activities:  -Silver sneakers https://tools.silversneakers.com  -Walk with a Doc: Http://www.duncan-williams.com/  -Check out the [facility] Active Adults 50+  section on the [place] [employer] (hiking clubs, book clubs, cards and games, chess, exercise classes, aquatic classes and much more) - see the website for details: https://www.[place].gov/departments/parks-recreation/active-adults50  -YouTube has lots of exercise videos for different ages and abilities as well  -[facility] (a variety of indoor and outdoor inperson activities for adults). [phone]. [address].  -Virtual Online Classes (a variety of topics): see seniorplanet.org or call [phone]  -consider volunteering at a school, hospice center, church, senior center or elsewhere            Chiquita JONELLE Cramp, DO     [1]  Current Outpatient Medications on File Prior to Visit  Medication Sig Dispense Refill   acyclovir  (ZOVIRAX ) 400 MG tablet Take 1 tablet (400 mg total) by mouth 2 (two) times daily. 60 tablet 11   aspirin-acetaminophen -caffeine  (EXCEDRIN MIGRAINE) 250-250-65 MG tablet Take 1 tablet by mouth every 8 (eight) hours as needed for migraine.     atorvastatin  (LIPITOR) 10 MG tablet TAKE 1 TABLET BY MOUTH EVERY DAY 90 tablet 3   Blood Glucose Monitoring Suppl (ONE TOUCH ULTRA 2) w/Device KIT Use to check blood sugars once daily. 1 kit 0   canagliflozin  (INVOKANA ) 300 MG TABS tablet TAKE 1 TABLET (300 MG TOTAL) BY MOUTH DAILY BEFORE BREAKFAST. 90 tablet 0   CHELATED ZINC PO Take 1 capsule by mouth daily.     Cholecalciferol (VITAMIN D3) 2000 units capsule Take 2,000 Units by mouth daily.      Continuous Glucose Sensor (FREESTYLE LIBRE 3 PLUS SENSOR) MISC PLACE 1 SENSOR ON THE SKIN EVERY 14 DAYS. USE TO CHECK GLUCOSE CONTINUOUSLY 2 each 5   Cyanocobalamin  (VITAMIN B 12 PO) Take  2,500 mcg by mouth daily.      dexamethasone  (DECADRON ) 4 MG tablet Take 5 tablets (20 mg) by mouth 30-60 minutes before daratumumab  treatment 90 tablet 0   esomeprazole  (NEXIUM ) 40 MG capsule TAKE 1 CAPSULE (40 MG TOTAL) BY MOUTH DAILY. 90 capsule 3    FOLIC ACID PO Take 800 mcg by mouth daily.      glipiZIDE  (GLUCOTROL ) 5 MG tablet TAKE 1 TABLET (5 MG TOTAL) BY MOUTH TWICE A DAY BEFORE MEALS 180 tablet 3   Levomefolate Glucosamine (METHYLFOLATE PO) Take 1 tablet by mouth daily.     LORazepam  (ATIVAN ) 2 MG tablet Take 1 tablet (2 mg total) by mouth every 6 (six) hours as needed for anxiety. 30 tablet 2   olmesartan  (BENICAR ) 40 MG tablet TAKE 1 TABLET BY MOUTH EVERY DAY 90 tablet 3   Omega-3 Fatty Acids (FISH OIL) 1000 MG CAPS Take by mouth daily.      ondansetron  (ZOFRAN ) 8 MG tablet Take 1 tablet (8 mg total) by mouth every 8 (eight) hours as needed for nausea or vomiting. 30 tablet 1   OneTouch Delica Lancets 30G MISC USE ONCE A DAY WHEN CHECKING BLOOD SUGAR 100 each 3   ONETOUCH VERIO test strip USE ONCE A DAY TO TEST BLOOD SUGAR 100 strip 1   pyridOXINE (VITAMIN B-6) 100 MG tablet Take 100 mg by mouth daily.     sodium bicarbonate  650 MG tablet TAKE 1 TABLET BY MOUTH 2 TIMES DAILY. 180 tablet 1   SUMAtriptan  (IMITREX ) 100 MG tablet TAKE 1 TABLET (100 MG TOTAL) BY MOUTH AS NEEDED FOR MIGRAINE. MAY REPEAT IN 2 HOURS IF HEADACHE PERSISTS OR RECURS. 9 tablet 6   SYNTHROID  137 MCG tablet TAKE 1 TABLET BY MOUTH DAILY BEFORE BREAKFAST. 90 tablet 3   traZODone  (DESYREL ) 100 MG tablet TAKE 1 TABLET BY MOUTH EVERYDAY AT BEDTIME 90 tablet 3   No current facility-administered medications on file prior to visit.  [2] No Known Allergies  "

## 2025-01-04 NOTE — Patient Instructions (Signed)

## 2025-01-04 NOTE — Progress Notes (Incomplete)
 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 male is a here because of positive M-spike. The patient was referred by nephrologist Dr. Gearline. The patient presents to the clinic today accompanied by his wife.   He is being seen by nephrologist who pursued further work up to why his Creatinine has increased. Upon workup his M-protein was found to be 0.8g/dl.  He has had HTN and DM for many years that can affects his Kidney function and was thought to be the likely cause of his CKD.    Today his wife notes he has had muscle pain, muscle mass loss and fatigue.    He has HTN, DM, GERD, Hypothyroid, Hyperlipidemia. He was recently taken off statins, meloxicam , ASA and metformin . He takes Nexium  for her GERD and has been on for many years.    On review of symptoms, pt notes purposeful weight loss, muscle loss and muscle pain. He also has leg pain from b/l knees to anterior shin.     SUMMARY OF ONCOLOGIC HISTORY:  Oncology History  Smoldering myeloma  10/10/2024 Initial Diagnosis   Smoldering myeloma   10/23/2024 -  Chemotherapy   Patient is on Treatment Plan : MYELOMA Daratumumab  SQ q28d      INTERVAL HISTORY: Troy Howard is a 84 y.o. 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  he was last seen by me on 12/11/2024; at the time he mentioned experiencing fatigue after his infusions, but was otherwise well.    Today, he says that he is feeling well overall. He notes that he did miss 2 of his treatments, but he is tolerating these. He reports that he is eating/sleeping well and his energy levels are  stable. Denies any new infection issues, new bone pains, or new leg swelling.  \ REVIEW OF SYSTEMS:   10 Point review of systems of done and is negative except as noted above.  MEDICAL HISTORY Past Medical History:  Diagnosis Date   Arthritis    back   Cancer [facility])    prostate, sees Dr. Gretel Ferrara    Chronic kidney disease 2017   stage 3 kidney disease   Diabetes mellitus    Erectile dysfunction    GERD (gastroesophageal reflux disease)    Hyperlipidemia    Hypertension    Migraines    Peptic ulcer    Thyroid  disease     IMMUNIZATION HISTORY Immunization History  Administered Date(s) Administered   Fluad Quad(high Dose 65+) 10/30/2022   INFLUENZA, HIGH DOSE SEASONAL PF 08/20/2015, 09/08/2017, 08/19/2018, 08/28/2019, 08/28/2019   Influenza Whole 09/05/2010   Influenza,inj,Quad PF,6+ Mos 08/14/2014   Influenza-Unspecified 09/16/2016, 09/08/2017, 09/23/2020, 09/24/2021   Moderna Covid-19 Vaccine Bivalent Booster 38yrs & up 09/01/2021   Moderna Sars-Covid-2 Vaccination 01/05/2020, 01/31/2020, 09/24/2020   Pneumococcal Conjugate-13 08/14/2014   Pneumococcal Polysaccharide-23 11/23/2007   Zoster Recombinant(Shingrix) 10/17/2018, 01/28/2019   Zoster, Live 11/19/2009    SURGICAL HISTORY Past Surgical History:  Procedure Laterality Date   APPENDECTOMY  1950   COLONOSCOPY  05/22/2021   per Dr. Aneita, benign polyps, no repeats needed   HERNIA REPAIR     umbilical    MENISCUS REPAIR  Left    PROSTATE SURGERY  2009   radical prostatectomy   SPINE SURGERY     L4-L5 fusion per Dr. Oneil Carwin    TONSILLECTOMY AND ADENOIDECTOMY  1954    SOCIAL HISTORY Social History[1]  Social History   Social History Narrative   Not on file   SOCIAL DRIVERS OF HEALTH SDOH Screenings   Food Insecurity: No Food Insecurity (03/15/2023)  Housing: Low Risk (03/15/2023)  Transportation Needs: No Transportation Needs (03/15/2023)  Alcohol Screen: Low Risk (01/15/2022)  Depression (PHQ2-9): Low  Risk (12/18/2024)  Financial Resource Strain: Low Risk (07/14/2022)  Physical Activity: Insufficiently Active (03/15/2023)  Social Connections: Moderately Isolated (03/15/2023)  Stress: Stress Concern Present (03/15/2023)  Tobacco Use: Medium Risk (09/11/2024)    FAMILY HISTORY Family History  Problem Relation Age of Onset   Hypertension Other    Cancer Other    Colon cancer Neg Hx    Colon polyps Neg Hx    Esophageal cancer Neg Hx    Rectal cancer Neg Hx    Stomach cancer Neg Hx     ALLERGIES: has no known allergies.  MEDICATIONS  Current Outpatient Medications  Medication Sig Dispense Refill   acyclovir  (ZOVIRAX ) 400 MG tablet Take 1 tablet (400 mg total) by mouth 2 (two) times daily. 60 tablet 11   aspirin-acetaminophen -caffeine  (EXCEDRIN MIGRAINE) 250-250-65 MG tablet Take 1 tablet by mouth every 8 (eight) hours as needed for migraine.     atorvastatin  (LIPITOR) 10 MG tablet TAKE 1 TABLET BY MOUTH EVERY DAY 90 tablet 3   Blood Glucose Monitoring Suppl (ONE TOUCH ULTRA 2) w/Device KIT Use to check blood sugars once daily. 1 kit 0   canagliflozin  (INVOKANA ) 300 MG TABS tablet TAKE 1 TABLET (300 MG TOTAL) BY MOUTH DAILY BEFORE BREAKFAST. 90 tablet 0   CHELATED ZINC PO Take 1 capsule by mouth daily.     Cholecalciferol (VITAMIN D3) 2000 units capsule Take 2,000 Units by mouth daily.      Continuous Glucose Sensor (FREESTYLE LIBRE 3 PLUS SENSOR) MISC PLACE 1 SENSOR ON THE SKIN EVERY 14 DAYS. USE TO CHECK GLUCOSE CONTINUOUSLY 2 each 5   Cyanocobalamin  (VITAMIN B 12 PO) Take 2,500 mcg by mouth daily.      dexamethasone  (DECADRON ) 4 MG tablet Take 5 tablets (20 mg) by mouth 30-60 minutes before daratumumab  treatment 90 tablet 0   esomeprazole  (NEXIUM ) 40 MG capsule TAKE 1 CAPSULE (40 MG TOTAL) BY MOUTH DAILY. 90 capsule 3   FOLIC ACID PO Take 800 mcg by mouth daily.      glipiZIDE  (GLUCOTROL ) 5 MG tablet TAKE 1 TABLET (5 MG TOTAL) BY MOUTH TWICE A DAY BEFORE MEALS 180 tablet 3    Levomefolate Glucosamine (METHYLFOLATE PO) Take 1 tablet by mouth daily.     LORazepam  (ATIVAN ) 2 MG tablet Take 1 tablet (2 mg total) by mouth every 6 (six) hours as needed for anxiety. 30 tablet 2   olmesartan  (BENICAR ) 40 MG tablet TAKE 1 TABLET BY MOUTH EVERY DAY 90 tablet 3   Omega-3 Fatty Acids (FISH OIL) 1000 MG CAPS Take by mouth daily.      ondansetron  (ZOFRAN ) 8 MG tablet Take 1 tablet (8 mg total) by mouth every 8 (eight) hours as needed for nausea or vomiting. 30 tablet 1   OneTouch Delica Lancets 30G MISC USE ONCE A DAY WHEN CHECKING BLOOD SUGAR 100 each 3   ONETOUCH VERIO test strip USE ONCE A DAY TO TEST BLOOD SUGAR 100 strip 1  pyridOXINE (VITAMIN B-6) 100 MG tablet Take 100 mg by mouth daily.     sodium bicarbonate  650 MG tablet TAKE 1 TABLET BY MOUTH 2 TIMES DAILY. 180 tablet 1   SUMAtriptan  (IMITREX ) 100 MG tablet TAKE 1 TABLET (100 MG TOTAL) BY MOUTH AS NEEDED FOR MIGRAINE. MAY REPEAT IN 2 HOURS IF HEADACHE PERSISTS OR RECURS. 9 tablet 6   SYNTHROID  137 MCG tablet TAKE 1 TABLET BY MOUTH DAILY BEFORE BREAKFAST. 90 tablet 3   traZODone  (DESYREL ) 100 MG tablet TAKE 1 TABLET BY MOUTH EVERYDAY AT BEDTIME 90 tablet 3   No current facility-administered medications for this visit.     PHYSICAL EXAMINATION: ECOG PERFORMANCE STATUS: {CHL ONC ECOG ED:[id number]} VITALS: There were no vitals filed for this visit. There were no vitals filed for this visit. There is no height or weight on file to calculate BMI.  GENERAL: alert, in no acute distress and comfortable SKIN: no acute rashes, no significant lesions EYES: conjunctiva are pink and non-injected, sclera anicteric OROPHARYNX: MMM, no exudates, no oropharyngeal erythema or ulceration NECK: supple, no JVD LYMPH:  no palpable lymphadenopathy in the cervical, axillary or inguinal regions LUNGS: clear to auscultation b/l with normal respiratory effort HEART: regular rate & rhythm ABDOMEN:  normoactive bowel sounds , non tender,  not distended, no hepatosplenomegaly Extremity: no pedal edema PSYCH: alert & oriented x 3 with fluent speech NEURO: no focal motor/sensory deficits  LABORATORY DATA:   I have reviewed the data as listed     Latest Ref Rng & Units 12/18/2024    2:49 PM 12/11/2024   10:32 AM 12/04/2024    8:24 AM  CBC EXTENDED  WBC 4.0 - 10.5 K/uL 6.7  7.1  5.6   RBC 4.22 - 5.81 MIL/uL 3.54  3.62  3.61   Hemoglobin 13.0 - 17.0 g/dL 88.4  88.5  88.3   HCT 39.0 - 52.0 % 33.5  33.9  33.5   Platelets 150 - 400 K/uL 189  173  197   NEUT# 1.7 - 7.7 K/uL 4.1  3.7  3.2   Lymph# 0.7 - 4.0 K/uL 1.8  2.5  1.8     - Discussed lab results on 01/04/2025 in detail with patient: {ELGKCBCMK2:34678} {RFE:65320} {ELGKOtherLabs (Optional):33764}  {ELGK_Labs:34608}     Latest Ref Rng & Units 12/18/2024    2:49 PM 12/04/2024    8:25 AM 11/14/2024   11:55 AM  CMP  Glucose 70 - 99 mg/dL 765  798  874   BUN 8 - 23 mg/dL 34  33  48   Creatinine 0.61 - 1.24 mg/dL 8.11  8.10  7.86   Sodium 135 - 145 mmol/L 135  135  135   Potassium 3.5 - 5.1 mmol/L 4.6  4.4  3.8   Chloride 98 - 111 mmol/L 100  105  105   CO2 22 - 32 mmol/L 26  21  21    Calcium  8.9 - 10.3 mg/dL 8.8  8.7  8.8   Total Protein 6.5 - 8.1 g/dL 8.2  8.1  8.7   Total Bilirubin 0.0 - 1.2 mg/dL 0.5  0.8  0.6   Alkaline Phos 38 - 126 U/L 84  72  71   AST 15 - 41 U/L 13  10  12    ALT 0 - 44 U/L 19  12  14      PREVIOUS STUDIES:   Surgical Pathology 10/12/2023  CASE: [id number] PATIENT: Kemper Donn Bone Marrow Report   Clinical History: Smoldering Myleoma  DIAGNOSIS:  BONE MARROW, ASPIRATE, CLOT, CORE: -Hypercellular bone marrow with plasma cell neoplasm -See comment  PERIPHERAL BLOOD: -Macrocytic anemia -Leukopenia  COMMENT:  The bone marrow is hypercellular for age with increased number of plasma cells representing 23% of all cells in the aspirate associated with interstitial infiltrates and numerous variably sized clusters in  the clot and biopsy sections.  The plasma cells display lambda light chain restriction consistent with plasma cell neoplasm.  The background shows trilineage hematopoiesis with generally nonspecific changes particularly involving megakaryocytes.  Correlation with cytogenetic and FISH studies is recommended.  MICROSCOPIC DESCRIPTION:  PERIPHERAL BLOOD SMEAR: The red blood cells display mild anisopoikilocytosis with mild polychromasia.  The white blood cells are decreased number but with no significant morphologic abnormalities.  The platelets are normal in number.  BONE MARROW ASPIRATE: Bone marrow particles present Erythroid precursors: Orderly and progressive maturation for the most part.  Occasional late precursors display nuclear cytoplasmic dyssynchrony. Granulocytic precursors: Orderly and progressive maturation Megakaryocytes: Abundant with scattered large, atypical forms Lymphocytes/plasma cells: The plasma cells are increased in number representing 23% of all cells but with lack of large aggregates or sheets.  Some of the plasma cells display atypical features with cytomegaly and/or small nucleoli.  Large lymphoid aggregates are not seen.  TOUCH PREPARATIONS: A mixture of cell types present including increased number of plasma cells  CLOT AND BIOPSY: The sections show cellularity ranging from 40 to 70% with interstitial infiltrates and numerous variably sized clusters of plasma cells in a background of trilineage hematopoiesis.  Significant lymphoid aggregates are not seen.  Immunohistochemical stain for CD138 and in situ hybridization for kappa and lambda were performed on blocks B1 and C1 with appropriate controls.  CD138 highlights the plasma cell component which displays lambda light chain restriction.  IRON STAIN: Iron stains are performed on a bone marrow aspirate or touch imprint smear and section of clot. The controls stained appropriately.       Storage Iron:  Present      Ring Sideroblasts: Absent  ADDITIONAL DATA/TESTING: The bone marrow specimen was sent for cytogenetic analysis and FISH for multiple myeloma and a separate report will follow.  CELL COUNT DATA:  Bone Marrow count performed on 500 cells shows: Blasts:   0%   Myeloid:  41% Promyelocytes: 1%   Erythroid:     26% Myelocytes:    5%   Lymphocytes:   8% Metamyelocytes:     3%   Plasma cells:  23% Bands:    9% Neutrophils:   20%  M:E ratio:     1.6 Eosinophils:   3% Basophils:     0% Monocytes:     2%  Lab Data: CBC performed on 10/12/2023 shows: WBC: 3.6 k/uL  Neutrophils:   47% Hgb: 11.5 g/dL Lymphocytes:   60% HCT: 37.1 %    Monocytes:     13% MCV: 101.9 fL  Eosinophils:   1% RDW: 14.4 %    Basophils:     0% PLT: 191 k/uL  GROSS DESCRIPTION:  A.  BM-aspirate smear  B.  Received in B-plus fixative is a 1.2 x 0.8 x 0.2 cm aggregate of tissue.  Submitted entirely in B1.  C.  Received in B-plus fixative are 4 cores of firm bone ranging from 0.7-1.1 cm in length and each measuring 0.2 cm in diameter.  Submitted entirely in C1, following decalcification in Immunocal. (KW, 10/12/2023)     10/12/2023    10/12/2023    Surgical Pathology 03/27/2021  CASE: [id number]  PATIENT: Juanjesus Mirarchi  Bone Marrow Report   Clinical History: Smoldering Myleoma   DIAGNOSIS:   BONE MARROW, ASPIRATE, CLOT, CORE:  -Hypercellular bone marrow with plasma cell neoplasm  -See comment   PERIPHERAL BLOOD:  -Macrocytic anemia  -Leukopenia   COMMENT:   The bone marrow is hypercellular for age with increased number of plasma  cells representing 23% of all cells in the aspirate associated with  interstitial infiltrates and numerous variably sized clusters in the  clot and biopsy sections.  The plasma cells display lambda light chain  restriction consistent with plasma cell neoplasm.  The background shows  trilineage hematopoiesis with generally nonspecific changes  particularly  involving megakaryocytes.  Correlation with cytogenetic and FISH studies  is recommended.     03/27/2021 Molecular Pathology     03/27/2021 Cytogenetics   RADIOGRAPHIC STUDIES: I have personally reviewed the radiological images as listed and agreed with the findings in the report. No results found.  ASSESSMENT & PLAN:  84 y.o. male with   1.  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    2. 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 Gearline  -mild non nephrotic proteinuria on 24h UPEP-Continue follow up with Dr Gearline for CKD     3. Vitamin B12 deficient  Antibody testing done - no evidence of pernicious anemia. No evidence of pernicious anemia based on neg IF and parietal cell ab -continue B12 replacement per PCP   4.  Patient Active Problem List   Diagnosis Date Noted   Smoldering myeloma 10/10/2024   CKD (chronic kidney disease) stage 3, GFR 30-59 ml/min (HCC) 11/02/2023   Insomnia 03/16/2023   COVID-19 virus infection 12/29/2021   Dyslipidemia 02/22/2018   Hypothyroidism 02/22/2018   Diabetes mellitus without complication (HCC) 08/14/2014   PROSTATE CANCER, HX OF 11/13/2010   GERD 01/17/2008   Acute peptic ulcer with hemorrhage 01/17/2008   ERECTILE DYSFUNCTION 11/23/2007   HYPERTENSION, BENIGN 11/23/2007   PROSTATE SPECIFIC ANTIGEN, ELEVATED 11/23/2007   Migraine headache 06/13/2007   PLAN: - Discussed lab results on 01/04/2025 in detail with patient: {ELGKCBCMK2:34678} {RFE:65320} {ELGKOtherLabs (Optional):33764}  -Multiple Myeloma labs stable at last visit.  - When treating, using multiple drugs to get most reasonable response without causing organ/kidney injury. - Advantagses  persist despite degree of response - Partial response, deep response, deep molecular response,   - Was high risk, which is why we started treatment to prevent further development  - Anemia improved Kidney stable, improved - Light chains stalble Stay adequately hydrated.   FOLLOW-UP in {WEEKS/MONTHS:30939} for labs and follow-up with Dr. Onesimo.  The total time spent in the appointment was *** minutes* .  All of the patient's questions were answered and the patient knows to call the clinic with any problems, questions, or concerns.  Emaline Onesimo MD MS AAHIVMS [facility] Hematology/Oncology Physician [facility]  *Total Encounter Time as defined by the Centers for Medicare and Medicaid Services includes, in addition to the face-to-face time of a patient visit (documented in the note above) non-face-to-face time: obtaining and reviewing outside history, ordering and reviewing medications, tests or procedures, care coordination (communications with other health care professionals or caregivers) and documentation in  the medical record.  I,Emily Lagle,acting as a neurosurgeon for Emaline Saran, MD.,have documented all relevant documentation on the behalf of Emaline Saran, MD,as directed by  Emaline Saran, MD while in the presence of Emaline Saran, MD.  I have reviewed the above documentation for accuracy and completeness, and I agree with the above.  Emaline Saran, MD      [1]  Social History Tobacco Use   Smoking status: Former    Current packs/day: 0.00    Average packs/day: 0.3 packs/day for 5.0 years (1.3 ttl pk-yrs)    Types: Cigarettes    Start date: 41    Quit date: 42    Years since quitting: 59.1   Smokeless tobacco: Never  Vaping Use   Vaping status: Never Used  Substance Use Topics   Alcohol use: Not Currently    Comment: rare one glass every 6 months   Drug use: No   "

## 2025-01-04 NOTE — Patient Instructions (Signed)
 I really enjoyed getting to talk with you today! I am available on Tuesdays and Thursdays for virtual visits if you have any questions or concerns, or if I can be of any further assistance.   CHECKLIST FROM ANNUAL WELLNESS VISIT:  -Follow up (please call to schedule if not scheduled after visit):   -call to schedule your annual exam with Dr. Johnny for labs and foot exam   -yearly for annual wellness visit with primary care office  Here is a list of your preventive care/health maintenance measures and the plan for each if any are due:  PLAN For any measures below that may be due:    1. Can get labs and foot exam at your in office appointment.   Health Maintenance  Topic Date Due   FOOT EXAM  10/23/2020   HEMOGLOBIN A1C  05/02/2024   Diabetic kidney evaluation - Urine ACR  12/28/2024   Medicare Annual Wellness (AWV)  01/24/2025   COVID-19 Vaccine (5 - 2025-26 season) 01/20/2025 (Originally 07/31/2024)   OPHTHALMOLOGY EXAM  08/09/2025   Diabetic kidney evaluation - eGFR measurement  01/04/2026   Pneumococcal Vaccine: 50+ Years  Completed   Influenza Vaccine  Completed   Zoster Vaccines- Shingrix  Completed   Meningococcal B Vaccine  Aged Out   DTaP/Tdap/Td  Discontinued   Colonoscopy  Discontinued    -See a dentist at least yearly  -Get your eyes checked and then per your eye specialist's recommendations  -Other issues addressed today:   -I have included below further information regarding a healthy whole foods based diet, physical activity guidelines for adults, stress management and opportunities for social connections. I hope you find this information useful.   -----------------------------------------------------------------------------------------------------------------------------------------------------------------------------------------------------------------------------------------------------------    NUTRITION: -eat real food: lots of colorful vegetables (half  the plate) and fruits -5-7 servings of vegetables and fruits per day (fresh or steamed is best), exp. 2 servings of vegetables with lunch and dinner and 2 servings of fruit per day. Berries and greens such as kale and collards are great choices.  -consume on a regular basis:  fresh fruits, fresh veggies, fish, nuts, seeds, healthy oils (such as olive oil, avocado oil), whole grains (make sure for bread/pasta/crackers/etc., that the first ingredient on label contains the word whole), legumes. -can eat small amounts of dairy and lean meat (no larger than the palm of your hand), but avoid processed meats such as ham, bacon, lunch meat, etc. -drink water -try to avoid fast food and pre-packaged foods, processed meat, ultra processed foods/beverages (donuts, candy, etc.) -most experts advise limiting sodium to < 2300mg  per day, should limit further is any chronic conditions such as high blood pressure, heart disease, diabetes, etc. The American Heart Association advised that < 1500mg  is is ideal -try to avoid foods/beverages that contain any ingredients with names you do not recognize  -try to avoid foods/beverages  with added sugar or sweeteners/sweets  -try to avoid sweet drinks (including diet drinks): soda, juice, Gatorade, sweet tea, power drinks, diet drinks -try to avoid white rice, white bread, pasta (unless whole grain)  EXERCISE GUIDELINES FOR ADULTS: -if you wish to increase your physical activity, do so gradually and with the approval of your doctor -STOP and seek medical care immediately if you have any chest pain, chest discomfort or trouble breathing when starting or increasing exercise  -move and stretch your body, legs, feet and arms when sitting for long periods -Physical activity guidelines for optimal health in adults: -get at least 150 minutes per  week of moderate exercise (can talk, but not sing); this is about 20-30 minutes of sustained activity 5-7 days per week or two 10-15  minute episodes of sustained activity 5-7 days per week -do some muscle building/resistance training/strength training at least 2 days per week  -balance exercises 3+ days per week:   Stand somewhere where you have something sturdy to hold onto if you lose balance    1) lift up on toes, then back down, start with 5x per day and work up to 20x   2) stand and lift one leg straight out to the side so that foot is a few inches of the floor, start with 5x each side and work up to 20x each side   3) stand on one foot, start with 5 seconds each side and work up to 20 seconds on each side  If you need ideas or help with getting more active:  -Silver sneakers https://tools.silversneakers.com  -Walk with a Doc: Http://www.duncan-williams.com/  -try to include resistance (weight lifting/strength building) and balance exercises twice per week: or the following link for ideas: http://castillo-powell.com/  buyducts.dk  STRESS MANAGEMENT: -can try meditating, or just sitting quietly with deep breathing while intentionally relaxing all parts of your body for 5 minutes daily -if you need further help with stress, anxiety or depression please follow up with your primary doctor or contact the wonderful folks at [facility]: [phone]  SOCIAL CONNECTIONS: -options in [place] if you wish to engage in more social and exercise related activities:  -Silver sneakers https://tools.silversneakers.com  -Walk with a Doc: Http://www.duncan-williams.com/  -Check out the [facility] Active Adults 50+ section on the [place] of [employer] companies (hiking clubs, book clubs, cards and games, chess, exercise classes, aquatic classes and much more) - see the website for details: https://www.[place].gov/departments/parks-recreation/active-adults50  -YouTube has lots of exercise videos for different ages and abilities as  well  -[facility] (a variety of indoor and outdoor inperson activities for adults). (445)454-3423. [address]  -Virtual Online Classes (a variety of topics): see seniorplanet.org or call (951) [phone]  -consider volunteering at a school, hospice center, church, senior center or elsewhere

## 2025-01-15 ENCOUNTER — Inpatient Hospital Stay

## 2025-01-29 ENCOUNTER — Inpatient Hospital Stay

## 2025-01-29 ENCOUNTER — Inpatient Hospital Stay: Admitting: Hematology

## 2025-02-12 ENCOUNTER — Inpatient Hospital Stay

## 2025-03-14 ENCOUNTER — Ambulatory Visit: Admitting: Family Medicine
